# Patient Record
Sex: Female | Born: 1940 | Race: White | Hispanic: No | Smoking: Former smoker
Health system: Southern US, Community
[De-identification: ages and names within clinical notes are randomized; demographics above are authoritative.]

## PROBLEM LIST (undated history)

## (undated) DIAGNOSIS — I82409 Acute embolism and thrombosis of unspecified deep veins of unspecified lower extremity: Secondary | ICD-10-CM

## (undated) DIAGNOSIS — J852 Abscess of lung without pneumonia: Secondary | ICD-10-CM

## (undated) DIAGNOSIS — I10 Essential (primary) hypertension: Secondary | ICD-10-CM

## (undated) DIAGNOSIS — I639 Cerebral infarction, unspecified: Secondary | ICD-10-CM

## (undated) DIAGNOSIS — H9319 Tinnitus, unspecified ear: Secondary | ICD-10-CM

## (undated) DIAGNOSIS — I4891 Unspecified atrial fibrillation: Secondary | ICD-10-CM

## (undated) DIAGNOSIS — M1990 Unspecified osteoarthritis, unspecified site: Secondary | ICD-10-CM

## (undated) DIAGNOSIS — K449 Diaphragmatic hernia without obstruction or gangrene: Secondary | ICD-10-CM

## (undated) DIAGNOSIS — E785 Hyperlipidemia, unspecified: Secondary | ICD-10-CM

## (undated) DIAGNOSIS — I959 Hypotension, unspecified: Secondary | ICD-10-CM

## (undated) DIAGNOSIS — I350 Nonrheumatic aortic (valve) stenosis: Secondary | ICD-10-CM

## (undated) DIAGNOSIS — I2510 Atherosclerotic heart disease of native coronary artery without angina pectoris: Secondary | ICD-10-CM

## (undated) DIAGNOSIS — F419 Anxiety disorder, unspecified: Secondary | ICD-10-CM

## (undated) DIAGNOSIS — Z9981 Dependence on supplemental oxygen: Secondary | ICD-10-CM

## (undated) DIAGNOSIS — F32A Depression, unspecified: Secondary | ICD-10-CM

## (undated) DIAGNOSIS — D649 Anemia, unspecified: Secondary | ICD-10-CM

## (undated) DIAGNOSIS — I739 Peripheral vascular disease, unspecified: Secondary | ICD-10-CM

## (undated) DIAGNOSIS — I509 Heart failure, unspecified: Secondary | ICD-10-CM

## (undated) DIAGNOSIS — D126 Benign neoplasm of colon, unspecified: Secondary | ICD-10-CM

## (undated) DIAGNOSIS — M2550 Pain in unspecified joint: Secondary | ICD-10-CM

## (undated) DIAGNOSIS — Z9289 Personal history of other medical treatment: Secondary | ICD-10-CM

## (undated) DIAGNOSIS — K219 Gastro-esophageal reflux disease without esophagitis: Secondary | ICD-10-CM

## (undated) DIAGNOSIS — F329 Major depressive disorder, single episode, unspecified: Secondary | ICD-10-CM

## (undated) DIAGNOSIS — K5790 Diverticulosis of intestine, part unspecified, without perforation or abscess without bleeding: Secondary | ICD-10-CM

## (undated) DIAGNOSIS — E119 Type 2 diabetes mellitus without complications: Secondary | ICD-10-CM

## (undated) DIAGNOSIS — J449 Chronic obstructive pulmonary disease, unspecified: Secondary | ICD-10-CM

## (undated) HISTORY — DX: Pain in unspecified joint: M25.50

## (undated) HISTORY — DX: Gastro-esophageal reflux disease without esophagitis: K21.9

## (undated) HISTORY — DX: Anemia, unspecified: D64.9

## (undated) HISTORY — DX: Anxiety disorder, unspecified: F41.9

## (undated) HISTORY — DX: Benign neoplasm of colon, unspecified: D12.6

## (undated) HISTORY — DX: Hypotension, unspecified: I95.9

## (undated) HISTORY — PX: CATARACT EXTRACTION, BILATERAL: SHX1313

## (undated) HISTORY — PX: TUBAL LIGATION: SHX77

## (undated) HISTORY — DX: Abscess of lung without pneumonia: J85.2

## (undated) HISTORY — DX: Cerebral infarction, unspecified: I63.9

## (undated) HISTORY — DX: Diverticulosis of intestine, part unspecified, without perforation or abscess without bleeding: K57.90

## (undated) HISTORY — DX: Tinnitus, unspecified ear: H93.19

## (undated) HISTORY — DX: Diaphragmatic hernia without obstruction or gangrene: K44.9

## (undated) HISTORY — DX: Heart failure, unspecified: I50.9

---

## 1998-08-19 ENCOUNTER — Ambulatory Visit (HOSPITAL_COMMUNITY): Admission: RE | Admit: 1998-08-19 | Discharge: 1998-08-19 | Payer: Self-pay | Admitting: Internal Medicine

## 1998-11-29 DIAGNOSIS — D126 Benign neoplasm of colon, unspecified: Secondary | ICD-10-CM

## 1998-11-29 HISTORY — DX: Benign neoplasm of colon, unspecified: D12.6

## 1999-03-26 ENCOUNTER — Encounter: Payer: Self-pay | Admitting: *Deleted

## 1999-03-27 ENCOUNTER — Ambulatory Visit: Admission: RE | Admit: 1999-03-27 | Discharge: 1999-03-27 | Payer: Self-pay | Admitting: *Deleted

## 1999-04-30 HISTORY — PX: FEMORAL-POPLITEAL BYPASS GRAFT: SHX937

## 1999-05-18 ENCOUNTER — Inpatient Hospital Stay (HOSPITAL_COMMUNITY): Admission: RE | Admit: 1999-05-18 | Discharge: 1999-05-22 | Payer: Self-pay | Admitting: *Deleted

## 1999-10-06 ENCOUNTER — Other Ambulatory Visit: Admission: RE | Admit: 1999-10-06 | Discharge: 1999-10-06 | Payer: Self-pay | Admitting: Gastroenterology

## 1999-10-06 ENCOUNTER — Encounter (INDEPENDENT_AMBULATORY_CARE_PROVIDER_SITE_OTHER): Payer: Self-pay

## 2000-05-29 HISTORY — PX: CORONARY ARTERY BYPASS GRAFT: SHX141

## 2000-06-24 ENCOUNTER — Encounter: Payer: Self-pay | Admitting: Cardiology

## 2000-06-24 ENCOUNTER — Inpatient Hospital Stay (HOSPITAL_COMMUNITY): Admission: RE | Admit: 2000-06-24 | Discharge: 2000-07-02 | Payer: Self-pay | Admitting: Cardiology

## 2000-06-27 ENCOUNTER — Encounter: Payer: Self-pay | Admitting: Surgery

## 2000-06-28 ENCOUNTER — Encounter: Payer: Self-pay | Admitting: Surgery

## 2000-06-29 ENCOUNTER — Encounter: Payer: Self-pay | Admitting: Surgery

## 2000-07-30 HISTORY — PX: ANGIOPLASTY / STENTING ILIAC: SUR31

## 2000-08-02 ENCOUNTER — Encounter: Payer: Self-pay | Admitting: *Deleted

## 2000-08-02 ENCOUNTER — Ambulatory Visit (HOSPITAL_COMMUNITY): Admission: RE | Admit: 2000-08-02 | Discharge: 2000-08-02 | Payer: Self-pay | Admitting: *Deleted

## 2000-08-11 ENCOUNTER — Inpatient Hospital Stay (HOSPITAL_COMMUNITY): Admission: RE | Admit: 2000-08-11 | Discharge: 2000-08-12 | Payer: Self-pay | Admitting: *Deleted

## 2000-09-04 ENCOUNTER — Encounter: Payer: Self-pay | Admitting: Emergency Medicine

## 2000-09-04 ENCOUNTER — Inpatient Hospital Stay (HOSPITAL_COMMUNITY): Admission: EM | Admit: 2000-09-04 | Discharge: 2000-09-05 | Payer: Self-pay | Admitting: Emergency Medicine

## 2000-09-13 ENCOUNTER — Encounter (HOSPITAL_COMMUNITY): Admission: RE | Admit: 2000-09-13 | Discharge: 2000-12-12 | Payer: Self-pay | Admitting: Cardiology

## 2001-01-04 ENCOUNTER — Encounter: Payer: Self-pay | Admitting: *Deleted

## 2001-01-06 ENCOUNTER — Encounter: Payer: Self-pay | Admitting: *Deleted

## 2001-01-06 ENCOUNTER — Inpatient Hospital Stay (HOSPITAL_COMMUNITY): Admission: RE | Admit: 2001-01-06 | Discharge: 2001-01-09 | Payer: Self-pay | Admitting: *Deleted

## 2001-05-18 ENCOUNTER — Other Ambulatory Visit: Admission: RE | Admit: 2001-05-18 | Discharge: 2001-05-18 | Payer: Self-pay | Admitting: Internal Medicine

## 2001-09-27 ENCOUNTER — Encounter: Payer: Self-pay | Admitting: *Deleted

## 2001-09-29 ENCOUNTER — Ambulatory Visit (HOSPITAL_COMMUNITY): Admission: RE | Admit: 2001-09-29 | Discharge: 2001-09-29 | Payer: Self-pay | Admitting: *Deleted

## 2001-10-19 ENCOUNTER — Encounter (INDEPENDENT_AMBULATORY_CARE_PROVIDER_SITE_OTHER): Payer: Self-pay | Admitting: *Deleted

## 2001-10-19 ENCOUNTER — Inpatient Hospital Stay (HOSPITAL_COMMUNITY): Admission: RE | Admit: 2001-10-19 | Discharge: 2001-10-24 | Payer: Self-pay | Admitting: *Deleted

## 2001-10-20 ENCOUNTER — Encounter: Payer: Self-pay | Admitting: *Deleted

## 2001-11-29 HISTORY — PX: OTHER SURGICAL HISTORY: SHX169

## 2002-05-15 ENCOUNTER — Encounter: Payer: Self-pay | Admitting: *Deleted

## 2002-05-15 ENCOUNTER — Inpatient Hospital Stay (HOSPITAL_COMMUNITY): Admission: AD | Admit: 2002-05-15 | Discharge: 2002-05-21 | Payer: Self-pay | Admitting: *Deleted

## 2002-05-16 ENCOUNTER — Encounter: Payer: Self-pay | Admitting: *Deleted

## 2002-11-29 HISTORY — PX: AORTA - BILATERAL FEMORAL ARTERY BYPASS GRAFT: SHX1175

## 2003-09-23 ENCOUNTER — Encounter: Payer: Self-pay | Admitting: *Deleted

## 2003-09-25 ENCOUNTER — Ambulatory Visit (HOSPITAL_COMMUNITY): Admission: RE | Admit: 2003-09-25 | Discharge: 2003-09-25 | Payer: Self-pay | Admitting: *Deleted

## 2003-10-10 ENCOUNTER — Inpatient Hospital Stay (HOSPITAL_COMMUNITY): Admission: RE | Admit: 2003-10-10 | Discharge: 2003-10-16 | Payer: Self-pay | Admitting: *Deleted

## 2003-10-10 ENCOUNTER — Encounter (INDEPENDENT_AMBULATORY_CARE_PROVIDER_SITE_OTHER): Payer: Self-pay | Admitting: *Deleted

## 2003-11-30 HISTORY — PX: CAROTID ENDARTERECTOMY: SUR193

## 2004-05-13 ENCOUNTER — Encounter: Payer: Self-pay | Admitting: Internal Medicine

## 2004-07-14 ENCOUNTER — Ambulatory Visit (HOSPITAL_COMMUNITY): Admission: RE | Admit: 2004-07-14 | Discharge: 2004-07-14 | Payer: Self-pay | Admitting: *Deleted

## 2004-07-30 HISTORY — PX: SUBCLAVIAN BYPASS GRAFT: SHX1059

## 2004-08-14 ENCOUNTER — Ambulatory Visit (HOSPITAL_COMMUNITY): Admission: RE | Admit: 2004-08-14 | Discharge: 2004-08-14 | Payer: Self-pay | Admitting: *Deleted

## 2004-08-19 ENCOUNTER — Inpatient Hospital Stay (HOSPITAL_COMMUNITY): Admission: RE | Admit: 2004-08-19 | Discharge: 2004-08-21 | Payer: Self-pay | Admitting: *Deleted

## 2004-10-12 ENCOUNTER — Ambulatory Visit: Payer: Self-pay | Admitting: Internal Medicine

## 2005-01-01 ENCOUNTER — Ambulatory Visit: Payer: Self-pay | Admitting: Internal Medicine

## 2005-04-01 ENCOUNTER — Ambulatory Visit: Payer: Self-pay | Admitting: Internal Medicine

## 2005-07-27 ENCOUNTER — Ambulatory Visit: Payer: Self-pay | Admitting: Internal Medicine

## 2005-08-03 ENCOUNTER — Ambulatory Visit: Payer: Self-pay | Admitting: Internal Medicine

## 2005-08-03 ENCOUNTER — Other Ambulatory Visit: Admission: RE | Admit: 2005-08-03 | Discharge: 2005-08-03 | Payer: Self-pay | Admitting: Internal Medicine

## 2005-08-31 ENCOUNTER — Ambulatory Visit: Payer: Self-pay | Admitting: Internal Medicine

## 2005-09-13 ENCOUNTER — Ambulatory Visit: Payer: Self-pay | Admitting: Gastroenterology

## 2005-09-27 ENCOUNTER — Encounter (INDEPENDENT_AMBULATORY_CARE_PROVIDER_SITE_OTHER): Payer: Self-pay | Admitting: *Deleted

## 2005-09-27 ENCOUNTER — Ambulatory Visit: Payer: Self-pay | Admitting: Gastroenterology

## 2005-12-28 ENCOUNTER — Ambulatory Visit: Payer: Self-pay | Admitting: Internal Medicine

## 2006-03-28 ENCOUNTER — Ambulatory Visit: Payer: Self-pay | Admitting: Internal Medicine

## 2006-06-27 ENCOUNTER — Ambulatory Visit: Payer: Self-pay | Admitting: Internal Medicine

## 2006-10-17 ENCOUNTER — Ambulatory Visit: Payer: Self-pay | Admitting: Family Medicine

## 2006-10-18 ENCOUNTER — Ambulatory Visit: Payer: Self-pay | Admitting: Internal Medicine

## 2006-10-31 ENCOUNTER — Ambulatory Visit: Payer: Self-pay | Admitting: Internal Medicine

## 2006-10-31 LAB — CONVERTED CEMR LAB
AST: 24 units/L (ref 0–37)
Albumin: 3.3 g/dL — ABNORMAL LOW (ref 3.5–5.2)
BUN: 23 mg/dL (ref 6–23)
Basophils Absolute: 0.1 10*3/uL (ref 0.0–0.1)
CO2: 29 meq/L (ref 19–32)
Chloride: 107 meq/L (ref 96–112)
Cholesterol: 124 mg/dL (ref 0–200)
Creatinine, Ser: 1.1 mg/dL (ref 0.4–1.2)
Eosinophil percent: 3.6 % (ref 0.0–5.0)
HCT: 35.3 % — ABNORMAL LOW (ref 36.0–46.0)
HDL: 45.5 mg/dL (ref >39.0)
Hemoglobin: 11.9 g/dL — ABNORMAL LOW (ref 12.0–15.0)
MCHC: 33.8 g/dL (ref 30.0–36.0)
MCV: 87.8 fL (ref 78.0–100.0)
Monocytes Absolute: 0.7 10*3/uL (ref 0.2–0.7)
Monocytes Relative: 7.9 % (ref 3.0–11.0)
Neutro Abs: 5.2 10*3/uL (ref 1.4–7.7)
Neutrophils Relative %: 61.3 % (ref 43.0–77.0)
TSH: 3.1 microintl units/mL (ref 0.35–5.50)
Total Bilirubin: 0.6 mg/dL (ref 0.3–1.2)
Triglyceride fasting, serum: 224 mg/dL (ref 0–149)
VLDL: 45 mg/dL — ABNORMAL HIGH (ref 0–40)

## 2006-11-07 ENCOUNTER — Ambulatory Visit: Payer: Self-pay | Admitting: Internal Medicine

## 2007-01-24 ENCOUNTER — Ambulatory Visit: Payer: Self-pay | Admitting: Internal Medicine

## 2007-03-06 ENCOUNTER — Ambulatory Visit: Payer: Self-pay | Admitting: Internal Medicine

## 2007-05-01 ENCOUNTER — Ambulatory Visit: Payer: Self-pay | Admitting: *Deleted

## 2007-06-12 ENCOUNTER — Encounter: Payer: Self-pay | Admitting: Internal Medicine

## 2007-06-12 ENCOUNTER — Ambulatory Visit: Payer: Self-pay | Admitting: Internal Medicine

## 2007-06-15 ENCOUNTER — Encounter: Payer: Self-pay | Admitting: Internal Medicine

## 2007-06-15 DIAGNOSIS — I10 Essential (primary) hypertension: Secondary | ICD-10-CM | POA: Insufficient documentation

## 2007-06-15 DIAGNOSIS — Z860100 Personal history of colon polyps, unspecified: Secondary | ICD-10-CM | POA: Insufficient documentation

## 2007-06-15 DIAGNOSIS — E785 Hyperlipidemia, unspecified: Secondary | ICD-10-CM | POA: Insufficient documentation

## 2007-06-15 DIAGNOSIS — M1990 Unspecified osteoarthritis, unspecified site: Secondary | ICD-10-CM

## 2007-06-15 DIAGNOSIS — I739 Peripheral vascular disease, unspecified: Secondary | ICD-10-CM

## 2007-09-05 ENCOUNTER — Telehealth: Payer: Self-pay | Admitting: Internal Medicine

## 2007-09-29 ENCOUNTER — Telehealth: Payer: Self-pay | Admitting: Internal Medicine

## 2007-10-16 ENCOUNTER — Ambulatory Visit: Payer: Self-pay | Admitting: Internal Medicine

## 2008-02-10 ENCOUNTER — Encounter: Payer: Self-pay | Admitting: Internal Medicine

## 2008-02-12 ENCOUNTER — Ambulatory Visit: Payer: Self-pay | Admitting: Internal Medicine

## 2008-02-12 LAB — CONVERTED CEMR LAB
AST: 28 units/L (ref 0–37)
Albumin: 4.1 g/dL (ref 3.5–5.2)
Alkaline Phosphatase: 64 units/L (ref 39–117)
Basophils Absolute: 0 10*3/uL (ref 0.0–0.1)
Basophils Relative: 0.5 % (ref 0.0–1.0)
CO2: 28 meq/L (ref 19–32)
Chloride: 109 meq/L (ref 96–112)
Creatinine, Ser: 1.1 mg/dL (ref 0.4–1.2)
Direct LDL: 89 mg/dL
HCT: 40.3 % (ref 36.0–46.0)
Hemoglobin: 13.2 g/dL (ref 12.0–15.0)
MCHC: 32.7 g/dL (ref 30.0–36.0)
Monocytes Absolute: 0.4 10*3/uL (ref 0.2–0.7)
Neutrophils Relative %: 74.8 % (ref 43.0–77.0)
RBC: 4.47 M/uL (ref 3.87–5.11)
RDW: 13.5 % (ref 11.5–14.6)
Sodium: 145 meq/L (ref 135–145)
Total Bilirubin: 0.6 mg/dL (ref 0.3–1.2)
Total CHOL/HDL Ratio: 3.6
Total Protein: 6.9 g/dL (ref 6.0–8.3)
Triglycerides: 243 mg/dL (ref 0–149)
VLDL: 49 mg/dL — ABNORMAL HIGH (ref 0–40)
WBC: 9 10*3/uL (ref 4.5–10.5)

## 2008-02-26 ENCOUNTER — Ambulatory Visit: Payer: Self-pay | Admitting: Internal Medicine

## 2008-03-05 ENCOUNTER — Ambulatory Visit: Payer: Self-pay | Admitting: Cardiology

## 2008-03-13 ENCOUNTER — Encounter: Payer: Self-pay | Admitting: Internal Medicine

## 2008-03-13 ENCOUNTER — Ambulatory Visit: Payer: Self-pay

## 2008-04-30 ENCOUNTER — Telehealth: Payer: Self-pay | Admitting: Internal Medicine

## 2008-05-23 ENCOUNTER — Ambulatory Visit: Payer: Self-pay | Admitting: *Deleted

## 2008-06-06 ENCOUNTER — Ambulatory Visit: Payer: Self-pay | Admitting: Internal Medicine

## 2008-06-06 DIAGNOSIS — F329 Major depressive disorder, single episode, unspecified: Secondary | ICD-10-CM

## 2008-07-24 ENCOUNTER — Ambulatory Visit: Payer: Self-pay | Admitting: Internal Medicine

## 2008-10-08 ENCOUNTER — Ambulatory Visit: Payer: Self-pay | Admitting: Internal Medicine

## 2008-11-06 ENCOUNTER — Ambulatory Visit: Payer: Self-pay | Admitting: *Deleted

## 2008-11-08 ENCOUNTER — Ambulatory Visit: Payer: Self-pay | Admitting: Internal Medicine

## 2008-11-08 ENCOUNTER — Encounter: Payer: Self-pay | Admitting: Internal Medicine

## 2008-12-10 ENCOUNTER — Telehealth: Payer: Self-pay | Admitting: Internal Medicine

## 2009-02-03 ENCOUNTER — Ambulatory Visit: Payer: Self-pay | Admitting: Internal Medicine

## 2009-02-22 ENCOUNTER — Encounter: Payer: Self-pay | Admitting: Internal Medicine

## 2009-03-05 ENCOUNTER — Encounter: Payer: Self-pay | Admitting: Internal Medicine

## 2009-03-10 ENCOUNTER — Encounter: Payer: Self-pay | Admitting: Internal Medicine

## 2009-03-13 ENCOUNTER — Ambulatory Visit: Payer: Self-pay | Admitting: Internal Medicine

## 2009-03-20 ENCOUNTER — Ambulatory Visit: Payer: Self-pay

## 2009-03-20 ENCOUNTER — Encounter: Payer: Self-pay | Admitting: Internal Medicine

## 2009-03-26 ENCOUNTER — Telehealth: Payer: Self-pay | Admitting: Internal Medicine

## 2009-04-01 ENCOUNTER — Ambulatory Visit: Payer: Self-pay | Admitting: Cardiology

## 2009-04-08 ENCOUNTER — Encounter: Payer: Self-pay | Admitting: Internal Medicine

## 2009-04-15 ENCOUNTER — Encounter: Payer: Self-pay | Admitting: Internal Medicine

## 2009-04-15 ENCOUNTER — Encounter: Payer: Self-pay | Admitting: Cardiology

## 2009-04-17 ENCOUNTER — Telehealth: Payer: Self-pay | Admitting: Cardiology

## 2009-04-21 ENCOUNTER — Encounter: Payer: Self-pay | Admitting: Cardiology

## 2009-05-01 ENCOUNTER — Ambulatory Visit: Payer: Self-pay | Admitting: *Deleted

## 2009-05-08 ENCOUNTER — Encounter: Admission: RE | Admit: 2009-05-08 | Discharge: 2009-05-08 | Payer: Self-pay | Admitting: General Surgery

## 2009-05-12 ENCOUNTER — Encounter (INDEPENDENT_AMBULATORY_CARE_PROVIDER_SITE_OTHER): Payer: Self-pay | Admitting: General Surgery

## 2009-05-12 ENCOUNTER — Encounter: Payer: Self-pay | Admitting: Internal Medicine

## 2009-05-12 ENCOUNTER — Ambulatory Visit (HOSPITAL_BASED_OUTPATIENT_CLINIC_OR_DEPARTMENT_OTHER): Admission: RE | Admit: 2009-05-12 | Discharge: 2009-05-12 | Payer: Self-pay | Admitting: General Surgery

## 2009-05-29 ENCOUNTER — Encounter: Payer: Self-pay | Admitting: Internal Medicine

## 2009-06-06 ENCOUNTER — Ambulatory Visit: Payer: Self-pay | Admitting: Internal Medicine

## 2009-06-06 DIAGNOSIS — R609 Edema, unspecified: Secondary | ICD-10-CM | POA: Insufficient documentation

## 2009-06-17 ENCOUNTER — Encounter: Payer: Self-pay | Admitting: Internal Medicine

## 2009-07-12 ENCOUNTER — Encounter: Admission: RE | Admit: 2009-07-12 | Discharge: 2009-07-12 | Payer: Self-pay | Admitting: Orthopedic Surgery

## 2009-07-18 ENCOUNTER — Encounter: Payer: Self-pay | Admitting: Internal Medicine

## 2009-07-22 ENCOUNTER — Encounter: Payer: Self-pay | Admitting: Internal Medicine

## 2009-09-01 ENCOUNTER — Ambulatory Visit: Payer: Self-pay | Admitting: Internal Medicine

## 2009-09-01 LAB — CONVERTED CEMR LAB
Alkaline Phosphatase: 56 units/L (ref 39–117)
Basophils Relative: 1 % (ref 0.0–3.0)
Bilirubin, Direct: 0 mg/dL (ref 0.0–0.3)
Blood in Urine, dipstick: NEGATIVE
Calcium: 9.9 mg/dL (ref 8.4–10.5)
Creatinine, Ser: 1.2 mg/dL (ref 0.4–1.2)
Eosinophils Absolute: 0.2 10*3/uL (ref 0.0–0.7)
Eosinophils Relative: 2.8 % (ref 0.0–5.0)
GFR calc non Af Amer: 47.5 mL/min (ref >60)
HDL: 49.2 mg/dL (ref >39.00)
Ketones, urine, test strip: NEGATIVE
LDL Cholesterol: 70 mg/dL (ref 0–99)
Lymphocytes Relative: 32.9 % (ref 12.0–46.0)
Monocytes Relative: 8.3 % (ref 3.0–12.0)
Neutrophils Relative %: 55 % (ref 43.0–77.0)
Nitrite: NEGATIVE
RBC: 3.84 M/uL — ABNORMAL LOW (ref 3.87–5.11)
Total CHOL/HDL Ratio: 3
Total Protein: 7 g/dL (ref 6.0–8.3)
Triglycerides: 127 mg/dL (ref 0.0–149.0)
Urobilinogen, UA: 0.2
WBC Urine, dipstick: NEGATIVE
WBC: 6.5 10*3/uL (ref 4.5–10.5)

## 2009-09-04 ENCOUNTER — Encounter: Payer: Self-pay | Admitting: Internal Medicine

## 2009-09-08 ENCOUNTER — Ambulatory Visit: Payer: Self-pay | Admitting: Internal Medicine

## 2009-09-08 DIAGNOSIS — Z87891 Personal history of nicotine dependence: Secondary | ICD-10-CM

## 2009-10-28 ENCOUNTER — Encounter: Payer: Self-pay | Admitting: Internal Medicine

## 2009-10-29 ENCOUNTER — Telehealth: Payer: Self-pay | Admitting: Internal Medicine

## 2009-11-25 ENCOUNTER — Ambulatory Visit: Payer: Self-pay | Admitting: Vascular Surgery

## 2009-11-29 DIAGNOSIS — J852 Abscess of lung without pneumonia: Secondary | ICD-10-CM

## 2009-11-29 HISTORY — DX: Abscess of lung without pneumonia: J85.2

## 2009-11-29 HISTORY — PX: LUNG BIOPSY: SHX232

## 2009-12-19 ENCOUNTER — Ambulatory Visit: Payer: Self-pay | Admitting: Internal Medicine

## 2009-12-19 DIAGNOSIS — K219 Gastro-esophageal reflux disease without esophagitis: Secondary | ICD-10-CM

## 2009-12-19 HISTORY — DX: Gastro-esophageal reflux disease without esophagitis: K21.9

## 2010-02-10 ENCOUNTER — Encounter: Payer: Self-pay | Admitting: Internal Medicine

## 2010-03-06 ENCOUNTER — Encounter: Payer: Self-pay | Admitting: Internal Medicine

## 2010-03-12 ENCOUNTER — Ambulatory Visit: Payer: Self-pay | Admitting: Internal Medicine

## 2010-03-12 DIAGNOSIS — R059 Cough, unspecified: Secondary | ICD-10-CM | POA: Insufficient documentation

## 2010-03-12 DIAGNOSIS — R05 Cough: Secondary | ICD-10-CM

## 2010-03-17 ENCOUNTER — Ambulatory Visit: Payer: Self-pay | Admitting: Internal Medicine

## 2010-04-01 ENCOUNTER — Ambulatory Visit: Payer: Self-pay | Admitting: Cardiology

## 2010-04-01 DIAGNOSIS — I25810 Atherosclerosis of coronary artery bypass graft(s) without angina pectoris: Secondary | ICD-10-CM | POA: Insufficient documentation

## 2010-04-07 ENCOUNTER — Telehealth (INDEPENDENT_AMBULATORY_CARE_PROVIDER_SITE_OTHER): Payer: Self-pay | Admitting: *Deleted

## 2010-04-08 ENCOUNTER — Encounter (HOSPITAL_COMMUNITY): Admission: RE | Admit: 2010-04-08 | Discharge: 2010-06-02 | Payer: Self-pay | Admitting: Cardiology

## 2010-04-08 ENCOUNTER — Ambulatory Visit: Payer: Self-pay

## 2010-04-08 ENCOUNTER — Ambulatory Visit: Payer: Self-pay | Admitting: Cardiology

## 2010-05-11 ENCOUNTER — Telehealth: Payer: Self-pay | Admitting: Internal Medicine

## 2010-05-26 ENCOUNTER — Ambulatory Visit: Payer: Self-pay | Admitting: Vascular Surgery

## 2010-06-10 ENCOUNTER — Telehealth: Payer: Self-pay | Admitting: Internal Medicine

## 2010-06-11 ENCOUNTER — Ambulatory Visit: Payer: Self-pay | Admitting: Internal Medicine

## 2010-06-11 ENCOUNTER — Telehealth: Payer: Self-pay | Admitting: Internal Medicine

## 2010-06-18 ENCOUNTER — Telehealth: Payer: Self-pay | Admitting: Internal Medicine

## 2010-06-25 ENCOUNTER — Ambulatory Visit: Payer: Self-pay | Admitting: Internal Medicine

## 2010-07-07 ENCOUNTER — Ambulatory Visit: Payer: Self-pay | Admitting: Family Medicine

## 2010-07-07 DIAGNOSIS — J209 Acute bronchitis, unspecified: Secondary | ICD-10-CM | POA: Insufficient documentation

## 2010-07-29 ENCOUNTER — Encounter (INDEPENDENT_AMBULATORY_CARE_PROVIDER_SITE_OTHER): Payer: Self-pay | Admitting: *Deleted

## 2010-07-30 ENCOUNTER — Encounter: Admission: RE | Admit: 2010-07-30 | Discharge: 2010-07-30 | Payer: Self-pay | Admitting: Internal Medicine

## 2010-07-30 ENCOUNTER — Telehealth: Payer: Self-pay | Admitting: Internal Medicine

## 2010-07-30 ENCOUNTER — Telehealth: Payer: Self-pay | Admitting: Family Medicine

## 2010-07-30 ENCOUNTER — Ambulatory Visit: Payer: Self-pay | Admitting: Internal Medicine

## 2010-07-30 LAB — CONVERTED CEMR LAB
BUN: 21 mg/dL (ref 6–23)
Basophils Relative: 0 % (ref 0.0–3.0)
Calcium: 9.3 mg/dL (ref 8.4–10.5)
Creatinine, Ser: 1.1 mg/dL (ref 0.4–1.2)
Eosinophils Absolute: 0 10*3/uL (ref 0.0–0.7)
GFR calc non Af Amer: 53.5 mL/min (ref >60)
MCHC: 32.4 g/dL (ref 30.0–36.0)
MCV: 85.3 fL (ref 78.0–100.0)
Monocytes Absolute: 0.9 10*3/uL (ref 0.1–1.0)
Neutro Abs: 13.2 10*3/uL — ABNORMAL HIGH (ref 1.4–7.7)
Neutrophils Relative %: 88.2 % — ABNORMAL HIGH (ref 43.0–77.0)
Potassium: 4.1 meq/L (ref 3.5–5.1)
RBC: 3.38 M/uL — ABNORMAL LOW (ref 3.87–5.11)

## 2010-07-31 ENCOUNTER — Telehealth: Payer: Self-pay | Admitting: Internal Medicine

## 2010-08-04 ENCOUNTER — Telehealth: Payer: Self-pay | Admitting: Internal Medicine

## 2010-08-11 ENCOUNTER — Ambulatory Visit: Payer: Self-pay | Admitting: Internal Medicine

## 2010-08-11 DIAGNOSIS — I959 Hypotension, unspecified: Secondary | ICD-10-CM

## 2010-08-11 HISTORY — DX: Hypotension, unspecified: I95.9

## 2010-08-13 ENCOUNTER — Ambulatory Visit: Payer: Self-pay | Admitting: Internal Medicine

## 2010-08-13 DIAGNOSIS — J852 Abscess of lung without pneumonia: Secondary | ICD-10-CM | POA: Insufficient documentation

## 2010-08-18 ENCOUNTER — Ambulatory Visit: Payer: Self-pay | Admitting: Critical Care Medicine

## 2010-08-21 ENCOUNTER — Ambulatory Visit: Payer: Self-pay | Admitting: Gastroenterology

## 2010-08-21 ENCOUNTER — Encounter: Payer: Self-pay | Admitting: Critical Care Medicine

## 2010-08-21 ENCOUNTER — Inpatient Hospital Stay (HOSPITAL_COMMUNITY): Admission: RE | Admit: 2010-08-21 | Discharge: 2010-08-24 | Payer: Self-pay | Admitting: Critical Care Medicine

## 2010-08-21 ENCOUNTER — Ambulatory Visit: Payer: Self-pay | Admitting: Critical Care Medicine

## 2010-08-21 ENCOUNTER — Ambulatory Visit: Payer: Self-pay | Admitting: Internal Medicine

## 2010-08-24 ENCOUNTER — Encounter: Payer: Self-pay | Admitting: Internal Medicine

## 2010-08-28 ENCOUNTER — Ambulatory Visit: Payer: Self-pay | Admitting: Critical Care Medicine

## 2010-08-28 DIAGNOSIS — D649 Anemia, unspecified: Secondary | ICD-10-CM

## 2010-08-28 DIAGNOSIS — K921 Melena: Secondary | ICD-10-CM

## 2010-08-28 HISTORY — DX: Anemia, unspecified: D64.9

## 2010-08-28 LAB — CONVERTED CEMR LAB
Eosinophils Relative: 1.1 % (ref 0.0–5.0)
Lymphocytes Relative: 20.7 % (ref 12.0–46.0)
Monocytes Relative: 7.7 % (ref 3.0–12.0)
Neutrophils Relative %: 70.4 % (ref 43.0–77.0)
Platelets: 160 10*3/uL (ref 150.0–400.0)
WBC: 9.5 10*3/uL (ref 4.5–10.5)

## 2010-09-01 ENCOUNTER — Encounter (INDEPENDENT_AMBULATORY_CARE_PROVIDER_SITE_OTHER): Payer: Self-pay | Admitting: *Deleted

## 2010-09-01 ENCOUNTER — Ambulatory Visit: Payer: Self-pay | Admitting: Critical Care Medicine

## 2010-09-09 ENCOUNTER — Ambulatory Visit: Payer: Self-pay | Admitting: Internal Medicine

## 2010-09-09 ENCOUNTER — Encounter: Payer: Self-pay | Admitting: Gastroenterology

## 2010-09-11 ENCOUNTER — Encounter: Payer: Self-pay | Admitting: Internal Medicine

## 2010-09-14 ENCOUNTER — Ambulatory Visit: Payer: Self-pay | Admitting: Internal Medicine

## 2010-09-15 ENCOUNTER — Telehealth: Payer: Self-pay | Admitting: Physician Assistant

## 2010-09-25 ENCOUNTER — Ambulatory Visit: Payer: Self-pay | Admitting: Critical Care Medicine

## 2010-09-25 ENCOUNTER — Encounter: Payer: Self-pay | Admitting: Critical Care Medicine

## 2010-09-25 DIAGNOSIS — J449 Chronic obstructive pulmonary disease, unspecified: Secondary | ICD-10-CM

## 2010-10-19 ENCOUNTER — Ambulatory Visit: Payer: Self-pay | Admitting: Gastroenterology

## 2010-10-26 ENCOUNTER — Encounter: Payer: Self-pay | Admitting: Gastroenterology

## 2010-11-25 ENCOUNTER — Ambulatory Visit: Payer: Self-pay | Admitting: Critical Care Medicine

## 2010-12-03 ENCOUNTER — Telehealth: Payer: Self-pay | Admitting: Internal Medicine

## 2010-12-10 ENCOUNTER — Telehealth: Payer: Self-pay | Admitting: Internal Medicine

## 2010-12-20 ENCOUNTER — Encounter: Payer: Self-pay | Admitting: Internal Medicine

## 2010-12-31 NOTE — Op Note (Signed)
Summary: Bronchoscopy / [facility]  Bronchoscopy / [facility]   Imported By: Lennie Odor 08/25/2010 16:28:16  _____________________________________________________________________  External Attachment:    Type:   Image     Comment:   External Document

## 2010-12-31 NOTE — Assessment & Plan Note (Signed)
Summary: Cardiology Nuclear Study  Nuclear Med Background Indications for Stress Test: Evaluation for Ischemia, Graft Patency   History: CABG, COPD, Heart Catheterization, Myocardial Perfusion Study  History Comments: '01CABG x 4, EF=40%; Multiple vascular procedures; '04 aorto-bifem graft; '05 (L) subclavian bypass;'09 DVV:OHYWVP, EF=69%  Symptoms: Dizziness, DOE, Fatigue, Near Syncope, Rapid HR    Nuclear Pre-Procedure Cardiac Risk Factors: Carotid Disease, Family History - CAD, History of Smoking, Hypertension, Lipids, PVD Caffeine/Decaff Intake: None NPO After: 7:30 PM Lungs: Clear.  O2 Sat 95% on RA. IV 0.9% NS with Angio Cath: 22g     IV Site: (R) AC IV Started by: Irean Hong RN Chest Size (in) 40     Cup Size D     Height (in): 63 Weight (lb): 150 BMI: 26.67  Nuclear Med Study 1 or 2 day study:  1 day     Stress Test Type:  Eugenie Birks Reading MD:  Willa Rough, MD     Referring MD:  Valera Castle, MD Resting Radionuclide:  Technetium 11m Tetrofosmin     Resting Radionuclide Dose:  11 mCi  Stress Radionuclide:  Technetium 54m Tetrofosmin     Stress Radionuclide Dose:  33 mCi   Stress Protocol   Lexiscan: 0.4 mg   Stress Test Technologist:  Rea College CMA-N     Nuclear Technologist:  Domenic Polite CNMT  Rest Procedure  Myocardial perfusion imaging was performed at rest 45 minutes following the intravenous administration of Myoview Technetium 37m Tetrofosmin.  Stress Procedure  The patient received IV Lexiscan 0.4 mg over 15-seconds.  Myoview injected at 30-seconds.  There were no significant changes with lexiscan; only occasional PVC's.  She did c/o her chest burning.  Quantitative spect images were obtained after a 45 minute delay.  QPS Raw Data Images:  Normal; no motion artifact; normal heart/lung ratio. Stress Images:  There is normal uptake in all areas. Rest Images:  Normal homogeneous uptake in all areas of the myocardium. Subtraction (SDS):  No evidence  of ischemia. Transient Ischemic Dilatation:  .99  (Normal <1.22)  Lung/Heart Ratio:  .27  (Normal <0.45)  Quantitative Gated Spect Images QGS EDV:  54 ml QGS ESV:  14 ml QGS EF:  74 % QGS cine images:  Normal motion  Findings Normal nuclear study      Overall Impression  Exercise Capacity: Lexiscan BP Response: Normal blood pressure response. Clinical Symptoms: Chest burning ECG Impression: No significant ST segment change suggestive of ischemia. Overall Impression: Normal stress nuclear study.  Appended Document: Cardiology Nuclear Study reassurance...no change in meds.  Appended Document: Cardiology Nuclear Study LMTCB./CY  Appended Document: Cardiology Nuclear Study PT AWARE./CY

## 2010-12-31 NOTE — Progress Notes (Signed)
Summary: refill hydrocodone acetamin.  Phone Note Refill Request Message from:  Fax from Pharmacy  Refills Requested: Medication #1:  HYDROCODONE-ACETAMINOPHEN 5-500 MG  TABS 1/2 tab six times daily   Last Refilled: 11/13/2010 [facility]   Method Requested: Fax to Local Pharmacy Initial call taken by: Duard Brady LPN,  December 10, 2010 8:11 AM  Follow-up for Phone Call        #90  RF 2 Follow-up by: Gordy Savers  MD,  December 10, 2010 12:44 PM    Prescriptions: HYDROCODONE-ACETAMINOPHEN 5-500 MG  TABS (HYDROCODONE-ACETAMINOPHEN) 1/2 tab six times daily  #90 x 2   Entered by:   Duard Brady LPN   Authorized by:   Gordy Savers  MD   Signed by:   Duard Brady LPN on 54/07/8118   Method used:   Historical   RxID:   [id number]  faxed back to pharmacy. kik

## 2010-12-31 NOTE — Assessment & Plan Note (Signed)
Summary: Pulmonary OV   Copy to:  [name] MD Primary Provider/Referring Provider:  Gordy Savers  MD  CC:  1 wk follow up.  Pt states breathing has improved.  Denies wheezing, chest tightness, and cough.  .  History of Present Illness: 70 yo WF with RML lung abscess no other risk factors and neg FOB for endobronchial lesion 9/11.  August 28, 2010 5:20 PM This pt was admitted to hosp after FOB on 9/23.  Pt had hemoptysis after FOB.  All c/s from FOB neg. Pt also with GIB and heme pos stools.  Pt had decompensated CHF as well.  Echo stable.  Hgb fell to 6.7.  Hgb 9.7 on d/c.  Pt received two units PRBC.   Pt d/c on levaquin. Pt had trop leak with low hgb but no true AMI.  Echo was stable  Since d/c there has been no dyspnea.  There is  no chest pain,  no f/c/s.  There is no hemoptysis.  There is  no mucus.   Pt is improved. Pt denies any significant sore throat, nasal congestion or excess secretions, fever, chills, sweats, unintended weight loss, pleurtic or exertional chest pain, orthopnea PND, or leg swelling Pt denies any increase in rescue therapy over baseline, denies waking up needing it or having any early am or nocturnal exacerbations of coughing/wheezing/or dyspnea.     Preventive Screening-Counseling & Management  Alcohol-Tobacco     Smoking Status: quit > 6 months     Smoke Cessation Stage: quit     Packs/Day: 1.5     Year Started: 1960     Year Quit: 06/2000     Pack years: 44  Current Medications (verified): 1)  Advair Diskus 250-50 Mcg/dose Misc (Fluticasone-Salmeterol) .... Inhale 1 Puff As Directed Twice A Day 2)  Altace 5 Mg Caps (Ramipril) .... Take 1 Capsule By Mouth Once A Day ** Hold 3)  Felodipine 5 Mg Tb24 (Felodipine) .Marland Kitchen.. 1 Once Daily  ** Hold 4)  Gabapentin 300 Mg Caps (Gabapentin) .Marland Kitchen.. 1 Tab Qam, 1 Tab @ Lunch, 2 Tabs At Bedtime 5)  Imdur 30 Mg Tb24 (Isosorbide Mononitrate) .Marland Kitchen.. 1 Once Daily 6)  Lorazepam 0.5 Mg Tabs (Lorazepam) ....  Take 1 Tablet Two Times A Day 7)  Simvastatin 80 Mg  Tabs (Simvastatin) .Marland Kitchen.. 1 Once Daily 8)  Hydrocodone-Acetaminophen 5-500 Mg  Tabs (Hydrocodone-Acetaminophen) .... 1/2 Tab Six Times Daily 9)  Chlorthalidone 25 Mg  Tabs (Chlorthalidone) .... One Daily ** Hold 10)  Sertraline Hcl 50 Mg  Tabs (Sertraline Hcl) .... 1/2 Tab At Bedtime 11)  Voltaren 1 % Gel (Diclofenac Sodium) .... Use Once Daily 12)  Fexofenadine Hcl 180 Mg Tabs (Fexofenadine Hcl) .Marland Kitchen.. 1 Once Daily As Needed 13)  Cilostazol 100 Mg Tabs (Cilostazol) .Marland Kitchen.. 1 Tab At Bedtime 14)  Fish Oil 1000 Mg Caps (Omega-3 Fatty Acids) .Marland Kitchen.. 1 Cap Once Daily 15)  Aspirin 81 Mg Tbec (Aspirin) .... Take One Tablet By Mouth Daily 16)  Multivitamins   Tabs (Multiple Vitamin) .Marland Kitchen.. 1 Tab Once Daily 17)  Furosemide 40 Mg Tabs (Furosemide) .... One Daily, As Needed To Control Peripheral Edema **hold** 18)  Omeprazole 20 Mg Tbec (Omeprazole) .Marland Kitchen.. 1 Tab Once Daily 19)  Promethazine Hcl 25 Mg Tabs (Promethazine Hcl) .... One Every 6 Hours For Nausea As Needed 20)  First-Dukes Mouthwash  Susp (Diphenhyd-Hydrocort-Nystatin) .Marland Kitchen.. 1 Teaspoon Swish and Swallow 4 Times Daily As Needed 21)  Levaquin 750 Mg  Tabs (Levofloxacin) .... One Tablet  By Mouth Daily  Allergies (verified): 1)  Codeine Phosphate (Codeine Phosphate)  Past History:  Past medical, surgical, family and social histories (including risk factors) reviewed, and no changes noted (except as noted below).  Past Medical History: CORONARY ARTERY DISEASE (ICD-414.00) PERIPHERAL VASCULAR DISEASE (ICD-443.9) HYPERTENSION (ICD-401.9) HYPERLIPIDEMIA (ICD-272.4) DEPRESSIVE DISORDER (ICD-311) OSTEOARTHRITIS (ICD-715.90) COLONIC POLYPS, HX OF (ICD-V12.72) lumbar spinal stenosis  right middle lobe abscess September 2011    -resolved on CXR 09/01/10    -neg FOB for endobronchial lesion RML GERD  Past Surgical History: Reviewed history from 08/18/2010 and no changes required. L subclavian bypass   September 2005 Tubal ligation Coronary artery bypass graft July 2001 Femoral popliteal bypass R.-June 2000  status post right common iliac PTA with stent placement, September 2001 status post redo right femoral l bypass February 2002; complicated by early thrombosis, requiring thrombectomy  aorto bifemoral bypass November 2004  colonoscopy October 2006 breast  bx (2) 2010 benign Dexa 12/09 carotid duplex- 6-09 Lumbar MRI 8-10 ABI 4-10 nuclear stress testing 5- 2011  negative for ischemia  Family History: Reviewed history from 09/08/2009 and no changes required. father died age 39.  History diabetes, cerebrovascular disease mother died at  age 19, status post hip surgery of an MI 5 brothers, one sister- one brother deceased -DM, CAD, SDAT  Positive diabetes, and coronary artery disease  Social History: Reviewed history from 08/18/2010 and no changes required. Former Smoker.  1 1/2 x 40 yrs.  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  The patient denies shortness of breath at rest, productive cough, non-productive cough, coughing up blood, chest pain, irregular heartbeats, acid heartburn, indigestion, loss of appetite, weight change, abdominal pain, difficulty swallowing, sore throat, tooth/dental problems, headaches, nasal congestion/difficulty breathing through nose, sneezing, itching, ear ache, anxiety, depression, hand/feet swelling, joint stiffness or pain, rash, change in color of mucus, and fever.    Vital Signs:  Patient profile:   70 year old female Height:      63.5 inches Weight:      131.25 pounds BMI:     22.97 O2 Sat:      95 % Temp:     97.9 degrees F oral Pulse rate:   90 / minute BP sitting:   120 / 56  (left arm) Cuff size:   regular  Vitals Entered By: Gweneth Dimitri RN (August 28, 2010 5:06 PM) CC: 1 wk follow up.  Pt states breathing has  improved.  Denies wheezing, chest tightness, cough.   Comments Medications reviewed with patient Daytime contact number verified with patient. Gweneth Dimitri RN  August 28, 2010 5:07 PM    Physical Exam  Additional Exam:  Gen: Pleasant, well-nourished, in no distress,  normal affect ENT: No lesions,  mouth clear,  oropharynx clear, no postnasal drip Neck: No JVD, no TMG, no carotid bruits Lungs: No use of accessory muscles, no dullness to percussion, clear, no rhonchi Cardiovascular: RRR, heart sounds normal, no murmur or gallops, no peripheral edema Abdomen: soft and NT, no HSM,  BS normal Musculoskeletal: No deformities, no cyanosis or clubbing Neuro: alert, non focal Skin: Warm, no lesions or rashes   White Cell Count          9.5 K/uL                    4.5-10.5  Red Cell Count       [L]  3.67 Mil/uL                 3.87-5.11   Hemoglobin           [L]  10.4 g/dL                   16.1-09.6   Hematocrit           [L]  31.3 %                      36.0-46.0   MCV                       85.2 fl                     78.0-100.0   MCHC                      33.4 g/dL                   04.5-40.9   RDW                  [H]  19.3 %                      11.5-14.6   Platelet Count            160.0 K/uL                  150.0-400.0   Neutrophil %              70.4 %                      43.0-77.0   Lymphocyte %              20.7 %                      12.0-46.0   Monocyte %                7.7 %                       3.0-12.0   Eosinophils%              1.1 %                       0.0-5.0   Basophils %               0.1 %                       0.0-3.0   Neutrophill Absolute      6.7 K/uL                    1.4-7.7   Lymphocyte Absolute       2.0 K/uL                    0.7-4.0   Monocyte Absolute         0.7 K/uL                    0.1-1.0  Eosinophils, Absolute  0.1 K/uL                    0.0-0.7   Basophils Absolute        0.0 K/uL                     0.0-0.1  CXR  Procedure date:  09/01/2010  Findings:      IMPRESSION: On previous CT a 6.6 cm confluent opacity with air-fluid level was identified in right middle lobe compatible with cavitary lesion or lung abscess.  At this site there is a curvilinear density which may reflect the wall of the cyst, bulla, or cavity.  The thickness of this is 6 mm.  No air fluid level is seen within it.  There is decrease in the surrounding previous infiltrative densities. No new acute process is evident.  Impression & Recommendations:  Problem # 1:  ABSCESS, PULMONARY (ICD-513.0) Assessment Improved RML lung abscess improved with rx. All c/s neg. FOB neg.  hemoptysis after Fob resolved plan finish current abx course.    Orders: Est. Patient Level V (16109) T-2 View CXR (71020TC)  Problem # 2:  HEMOCCULT POSITIVE STOOL (ICD-578.1) Assessment: Unchanged Iron def anemia with heme pos stool plan GI referral Dr Russella Dar Orders: Gastroenterology Referral (GI)  Medications Added to Medication List This Visit: 1)  Lorazepam 0.5 Mg Tabs (Lorazepam) .... Take 1 tablet two times a day  Other Orders: TLB-CBC Platelet - w/Differential (85025-[id number])  Patient Instructions: 1)  Finish levaquin 2)  Lab today 3)  Chest xray today 4)  A referral to Dr Russella Dar will be made ASAP for blood in stool and anemia 5)  Return 1 month     Prevention & Chronic Care Immunizations   Influenza vaccine: Fluvax 3+  (09/08/2009)    Tetanus booster: Not documented    Pneumococcal vaccine: Not documented    H. zoster vaccine: Not documented  Colorectal Screening   Hemoccult: Not documented    Colonoscopy: Done  (09/27/2005)  Other Screening   Pap smear: Not documented    Mammogram: Not documented    DXA bone density scan: Not documented   Smoking status: quit > 6 months  (08/28/2010)  Lipids   Total Cholesterol: 145  (09/01/2009)   LDL: 70  (09/01/2009)   LDL Direct: 89.0  (02/12/2008)   HDL:  49.20  (09/01/2009)   Triglycerides: 127.0  (09/01/2009)    SGOT (AST): 22  (09/01/2009)   SGPT (ALT): 21  (09/01/2009)   Alkaline phosphatase: 56  (09/01/2009)   Total bilirubin: 0.6  (09/01/2009)  Hypertension   Last Blood Pressure: 120 / 56  (08/28/2010)   Serum creatinine: 1.1  (07/30/2010)   Serum potassium 4.1  (07/30/2010)  Self-Management Support :    Hypertension self-management support: Not documented    Lipid self-management support: Not documented    Nursing Instructions: Give Flu vaccine today

## 2010-12-31 NOTE — Assessment & Plan Note (Signed)
Summary: 3 month fup//ccm   Vital Signs:  Patient profile:   70 year old female Weight:      150 pounds Temp:     98.1 degrees F oral BP sitting:   110 / 70  (right arm) Cuff size:   regular  Vitals Entered By: Duard Brady LPN (June 11, 2010 8:09 AM) CC: 3 mos rov - doing well     on/off loose stool Is Patient Diabetic? No   Primary Care Provider:  Gordy Savers  MD  CC:  3 mos rov - doing well     on/off loose stool.  History of Present Illness:  70 year old patient who is in today for follow-up of her coronary artery disease.  She has dyspnea and exertion, but no exertional chest pain.  No claudication.  She has treated hypertension and dyslipidemia, which remains stable.  She has a history of anxiety, depression.    No cardiopulmonary complaints.  Only complaint is some episodic diarrhea, and stress incontinence. she had a nuclear  medicine stress test two months ago  Preventive Screening-Counseling & Management  Alcohol-Tobacco     Smoking Status: never  Allergies: 1)  Codeine Phosphate (Codeine Phosphate)  Past History:  Past Medical History: Reviewed history from 12/19/2009 and no changes required. CORONARY ARTERY DISEASE (ICD-414.00) PERIPHERAL VASCULAR DISEASE (ICD-443.9) HYPERTENSION (ICD-401.9) HYPERLIPIDEMIA (ICD-272.4) DEPRESSIVE DISORDER (ICD-311) HEALTH SCREENING (ICD-V70.0) OSTEOARTHRITIS (ICD-715.90) COLONIC POLYPS, HX OF (ICD-V12.72) lumbar spinal stenosis   GERD  Past Surgical History: L subclavian bypass  September 2005 Tubal ligation Coronary artery bypass graft July 2001 Femoral popliteal bypass R.-June 2000  status post right common iliac PTA with stent placement, September 2001 status post redo right femoral until bypass February 2002; complicated by early thrombosis, requiring thrombectomy  aorto bifemoral bypass November 2004  colonoscopy October 2006 breat bx (2) 2010 Dexa 12/09 carotid duplex- 6-09 Lumbar MRI  8-10 ABI 4-10 nuclear stress testing 5- 2011  Family History: Reviewed history from 09/08/2009 and no changes required. father died age 28.  History diabetes, cerebrovascular disease mother died at  age 69, status post hip surgery of an MI 5 brothers, one sister- one brother deceased -DM, CAD, SDAT  Positive diabetes, and coronary artery disease  Social History: Smoking Status:  never  Review of Systems       The patient complains of dyspnea on exertion.  The patient denies anorexia, fever, weight loss, weight gain, vision loss, decreased hearing, hoarseness, chest pain, syncope, peripheral edema, prolonged cough, headaches, hemoptysis, abdominal pain, melena, hematochezia, severe indigestion/heartburn, hematuria, incontinence, genital sores, muscle weakness, suspicious skin lesions, transient blindness, difficulty walking, depression, unusual weight change, abnormal bleeding, enlarged lymph nodes, angioedema, and breast masses.    Physical Exam  General:  Well-developed,well-nourished,in no acute distress; alert,appropriate and cooperative throughout examination; 108/70 Head:  Normocephalic and atraumatic without obvious abnormalities. No apparent alopecia or balding. Mouth:  Oral mucosa and oropharynx without lesions or exudates.  Teeth in good repair. Neck:  bilateral bruits Chest Wall:  No deformities, masses, or tenderness noted. status post sternotomy Lungs:  Normal respiratory effort, chest expands symmetrically. Lungs are clear to auscultation, no crackles or wheezes. Heart:  grade 2 to 3/6 systolic murmur Abdomen:  Bowel sounds positive,abdomen soft and non-tender without masses, organomegaly or hernias noted. Msk:  No deformity or scoliosis noted of thoracic or lumbar spine.   Pulses:  posterior tibial pulses absent Extremities:  No clubbing, cyanosis, edema, or deformity noted with normal full range of motion of all  joints.   Skin:  Intact without suspicious lesions or  rashes Cervical Nodes:  No lymphadenopathy noted   Impression & Recommendations:  Problem # 1:  CAD, ARTERY BYPASS GRAFT (ICD-414.04)  Her updated medication list for this problem includes:    Altace 5 Mg Caps (Ramipril) .Marland Kitchen... Take 1 capsule by mouth once a day    Felodipine 5 Mg Tb24 (Felodipine) .Marland Kitchen... 1 once daily    Imdur 30 Mg Tb24 (Isosorbide mononitrate) .Marland Kitchen... 1 once daily    Chlorthalidone 25 Mg Tabs (Chlorthalidone) ..... One daily    Cilostazol 100 Mg Tabs (Cilostazol) .Marland Kitchen... 1 tab at bedtime    Aspirin 81 Mg Tbec (Aspirin) .Marland Kitchen... Take one tablet by mouth daily    Furosemide 40 Mg Tabs (Furosemide) ..... One daily, as needed to control peripheral edema  Her updated medication list for this problem includes:    Altace 5 Mg Caps (Ramipril) .Marland Kitchen... Take 1 capsule by mouth once a day    Felodipine 5 Mg Tb24 (Felodipine) .Marland Kitchen... 1 once daily    Imdur 30 Mg Tb24 (Isosorbide mononitrate) .Marland Kitchen... 1 once daily    Chlorthalidone 25 Mg Tabs (Chlorthalidone) ..... One daily    Cilostazol 100 Mg Tabs (Cilostazol) .Marland Kitchen... 1 tab at bedtime    Aspirin 81 Mg Tbec (Aspirin) .Marland Kitchen... Take one tablet by mouth daily    Furosemide 40 Mg Tabs (Furosemide) ..... One daily, as needed to control peripheral edema  Problem # 2:  PERIPHERAL VASCULAR DISEASE (ICD-443.9)  Problem # 3:  HYPERTENSION (ICD-401.9)  Her updated medication list for this problem includes:    Altace 5 Mg Caps (Ramipril) .Marland Kitchen... Take 1 capsule by mouth once a day    Felodipine 5 Mg Tb24 (Felodipine) .Marland Kitchen... 1 once daily    Chlorthalidone 25 Mg Tabs (Chlorthalidone) ..... One daily    Furosemide 40 Mg Tabs (Furosemide) ..... One daily, as needed to control peripheral edema  Her updated medication list for this problem includes:    Altace 5 Mg Caps (Ramipril) .Marland Kitchen... Take 1 capsule by mouth once a day    Felodipine 5 Mg Tb24 (Felodipine) .Marland Kitchen... 1 once daily    Chlorthalidone 25 Mg Tabs (Chlorthalidone) ..... One daily    Furosemide 40 Mg Tabs  (Furosemide) ..... One daily, as needed to control peripheral edema  Problem # 4:  HYPERLIPIDEMIA (ICD-272.4)  Her updated medication list for this problem includes:    Simvastatin 80 Mg Tabs (Simvastatin) .Marland Kitchen... 1 once daily  Her updated medication list for this problem includes:    Simvastatin 80 Mg Tabs (Simvastatin) .Marland Kitchen... 1 once daily  Complete Medication List: 1)  Advair Diskus 250-50 Mcg/dose Misc (Fluticasone-salmeterol) .... Inhale 1 puff as directed twice a day 2)  Altace 5 Mg Caps (Ramipril) .... Take 1 capsule by mouth once a day 3)  Felodipine 5 Mg Tb24 (Felodipine) .Marland Kitchen.. 1 once daily 4)  Gabapentin 300 Mg Caps (Gabapentin) .Marland Kitchen.. 1 tab qam, 1 tab @ lunch, 2 tabs at bedtime 5)  Imdur 30 Mg Tb24 (Isosorbide mononitrate) .Marland Kitchen.. 1 once daily 6)  Lorazepam 0.5 Mg Tabs (Lorazepam) .... Take 1 tablet bid 7)  Naproxen 500 Mg Tabs (Naproxen) .Marland Kitchen.. 1 two times a day 8)  Simvastatin 80 Mg Tabs (Simvastatin) .Marland Kitchen.. 1 once daily 9)  Hydrocodone-acetaminophen 5-500 Mg Tabs (Hydrocodone-acetaminophen) .... 1/2 tab six times daily 10)  Chlorthalidone 25 Mg Tabs (Chlorthalidone) .... One daily 11)  Sertraline Hcl 50 Mg Tabs (Sertraline hcl) .... 1/2 tab at bedtime 12)  Voltaren  1 % Gel (Diclofenac sodium) .... Use once daily 13)  Fexofenadine Hcl 180 Mg Tabs (Fexofenadine hcl) .Marland Kitchen.. 1 once daily as needed 14)  Cilostazol 100 Mg Tabs (Cilostazol) .Marland Kitchen.. 1 tab at bedtime 15)  Fish Oil 1000 Mg Caps (Omega-3 fatty acids) .Marland Kitchen.. 1 cap once daily 16)  Aspirin 81 Mg Tbec (Aspirin) .... Take one tablet by mouth daily 17)  Multivitamins Tabs (Multiple vitamin) .Marland Kitchen.. 1 tab once daily 18)  Furosemide 40 Mg Tabs (Furosemide) .... One daily, as needed to control peripheral edema 19)  Omeprazole 20 Mg Tbec (Omeprazole) .Marland Kitchen.. 1 tab once daily  Patient Instructions: 1)  Please schedule a follow-up appointment in 4 months. 2)  Advised not to eat any food or drink any liquids after 10 PM the night before your  procedure. 3)  Limit your Sodium (Salt) to less than 2 grams a day(slightly less than 1/2 a teaspoon) to prevent fluid retention, swelling, or worsening of symptoms. 4)  It is important that you exercise regularly at least 20 minutes 5 times a week. If you develop chest pain, have severe difficulty breathing, or feel very tired , stop exercising immediately and seek medical attention.

## 2010-12-31 NOTE — Assessment & Plan Note (Signed)
Summary: N,V, weakness/dm   Vital Signs:  Patient profile:   70 year old female Weight:      139 pounds Temp:     98.0 degrees F oral BP sitting:   90 / 60  (left arm) Cuff size:   regular  Vitals Entered By: Duard Brady LPN (August 11, 2010 3:43 PM) CC: c/o nausea and vomiting - weakness  Is Patient Diabetic? No   Primary Care Provider:  Gordy Savers  MD  CC:  c/o nausea and vomiting - weakness .  History of Present Illness:  70 year old patient who is presently being treated for a right middle lobe lung abscess, diagnosed approximately 12 days ago.  For the past couple days.  She has had some increasing weakness and nausea. she no longer has had any hemoptysis, and denies any sputum production of significance.  No fever or chills.  She has had two episodes of vomiting over the past 3 days.  She has no working daily.  Denies any shortness of breath  Allergies: 1)  Codeine Phosphate (Codeine Phosphate)  Past History:  Past Medical History: Reviewed history from 12/19/2009 and no changes required. CORONARY ARTERY DISEASE (ICD-414.00) PERIPHERAL VASCULAR DISEASE (ICD-443.9) HYPERTENSION (ICD-401.9) HYPERLIPIDEMIA (ICD-272.4) DEPRESSIVE DISORDER (ICD-311) HEALTH SCREENING (ICD-V70.0) OSTEOARTHRITIS (ICD-715.90) COLONIC POLYPS, HX OF (ICD-V12.72) lumbar spinal stenosis   GERD  Review of Systems       The patient complains of anorexia and weight loss.  The patient denies fever, weight gain, vision loss, decreased hearing, hoarseness, chest pain, syncope, dyspnea on exertion, peripheral edema, prolonged cough, headaches, hemoptysis, abdominal pain, melena, hematochezia, severe indigestion/heartburn, hematuria, incontinence, genital sores, muscle weakness, suspicious skin lesions, transient blindness, difficulty walking, depression, unusual weight change, abnormal bleeding, enlarged lymph nodes, angioedema, and breast masses.    Physical Exam  General:   Well-developed,well-nourished,in no acute distress; alert,appropriate and cooperative throughout examination Head:  Normocephalic and atraumatic without obvious abnormalities. No apparent alopecia or balding. Eyes:  No corneal or conjunctival inflammation noted. EOMI. Perrla. Funduscopic exam benign, without hemorrhages, exudates or papilledema. Vision grossly normal. Mouth:  Oral mucosa and oropharynx without lesions or exudates.  Teeth in good repair. Neck:  bilateral bruits Chest Wall:  No deformities, masses, or tenderness noted. Lungs:  a few crackles at the right base O2 saturation 97-98% Heart:  grade 2/6 systolic murmur no tachycardia; blood pressure 90/60 Abdomen:  Bowel sounds positive,abdomen soft and non-tender without masses, organomegaly or hernias noted.   Impression & Recommendations:  Problem # 1:  ABSCESS OF LUNG (ICD-513.0)  Problem # 2:  HYPOTENSION (ICD-458.9) will hold felodipine, and chlorthalidone, and force fluids; will recheck in two days and obtain a follow-up chest x-ray at that time  Complete Medication List: 1)  Advair Diskus 250-50 Mcg/dose Misc (Fluticasone-salmeterol) .... Inhale 1 puff as directed twice a day 2)  Altace 5 Mg Caps (Ramipril) .... Take 1 capsule by mouth once a day 3)  Felodipine 5 Mg Tb24 (Felodipine) .Marland Kitchen.. 1 once daily 4)  Gabapentin 300 Mg Caps (Gabapentin) .Marland Kitchen.. 1 tab qam, 1 tab @ lunch, 2 tabs at bedtime 5)  Imdur 30 Mg Tb24 (Isosorbide mononitrate) .Marland Kitchen.. 1 once daily 6)  Lorazepam 0.5 Mg Tabs (Lorazepam) .... Take 1 tablet bid 7)  Naproxen 500 Mg Tabs (Naproxen) .Marland Kitchen.. 1 two times a day 8)  Simvastatin 80 Mg Tabs (Simvastatin) .Marland Kitchen.. 1 once daily 9)  Hydrocodone-acetaminophen 5-500 Mg Tabs (Hydrocodone-acetaminophen) .... 1/2 tab six times daily 10)  Chlorthalidone 25 Mg Tabs (Chlorthalidone) .Marland KitchenMarland KitchenMarland Kitchen  One daily 11)  Sertraline Hcl 50 Mg Tabs (Sertraline hcl) .... 1/2 tab at bedtime 12)  Voltaren 1 % Gel (Diclofenac sodium) .... Use once  daily 13)  Fexofenadine Hcl 180 Mg Tabs (Fexofenadine hcl) .Marland Kitchen.. 1 once daily as needed 14)  Cilostazol 100 Mg Tabs (Cilostazol) .Marland Kitchen.. 1 tab at bedtime 15)  Fish Oil 1000 Mg Caps (Omega-3 fatty acids) .Marland Kitchen.. 1 cap once daily 16)  Aspirin 81 Mg Tbec (Aspirin) .... Take one tablet by mouth daily 17)  Multivitamins Tabs (Multiple vitamin) .Marland Kitchen.. 1 tab once daily 18)  Furosemide 40 Mg Tabs (Furosemide) .... One daily, as needed to control peripheral edema 19)  Omeprazole 20 Mg Tbec (Omeprazole) .Marland Kitchen.. 1 tab once daily 20)  Tessalon 200 Mg Caps (Benzonatate) .... One tab every 8 hours for cough 21)  Hydromet 5-1.5 Mg/32ml Syrp (Hydrocodone-homatropine) .... 2 tsp q4- h as needed cough as needed cough 22)  Levaquin 750 Mg Tabs (Levofloxacin) .Marland Kitchen.. 1 once daily for lung infection 23)  Azithromycin 250 Mg Tabs (Azithromycin) .... Two initially, then one daily for 4 additional days 24)  Levaquin 750 Mg Tabs (Levofloxacin) .Marland Kitchen.. 1 by mouth once daily for 14 days 25)  Promethazine Hcl 25 Mg Tabs (Promethazine hcl) .... One every 6 hours for nausea  Patient Instructions: 1)  return in two days as scheduled 2)  Drink as much fluid as you can tolerate for the next few days. 3)  discontinue felodipine, and chlorthalidone, until further notice Prescriptions: PROMETHAZINE HCL 25 MG TABS (PROMETHAZINE HCL) one every 6 hours for nausea  #30 x 0   Entered and Authorized by:   Gordy Savers  MD   Signed by:   Gordy Savers  MD on 08/11/2010   Method used:   Electronically to        [facility]  [address] [phone][facility] (retail)       [address] [place], [place]  [address]       Ph: [phone] or [phone]       Fax: [phone]   RxID:   [id number] PROMETHAZINE HCL 25 MG TABS (PROMETHAZINE HCL) one every 6 hours for nausea  #30 x 0   Entered and Authorized by:   Gordy Savers  MD   Signed by:   Gordy Savers  MD on 08/11/2010   Method used:   Electronically to        [facility] [address]* (retail)       [address] [place], [place]  [address]       Ph: [phone]       Fax: [phone]   RxID:   [id number]

## 2010-12-31 NOTE — Assessment & Plan Note (Signed)
Summary: LOTS OF MUCUS NIGHTS/PS   Vital Signs:  Patient profile:   70 year old female Weight:      156 pounds Temp:     98.0 degrees F oral BP sitting:   122 / 60  (left arm)  Vitals Entered By: Raechel Ache, RN (December 19, 2009 4:35 PM) CC: C/o ?reflux- regurgitation, productive cough burning in chest   Primary Care Provider:  Gordy Savers  MD  CC:  C/o ?reflux- regurgitation and productive cough burning in chest.  History of Present Illness: 70 year old patient who is seen today confinement of reflux symptoms.  She states at night.  She develops a burning quality in her throat becomes congested with cough, which disrupt her sleep.  She has difficult time returning to sleep.  She takes omeprazole  daily, but usually after breakfast in the morning.  Her cardiac status has been stable  Allergies: 1)  Codeine Phosphate (Codeine Phosphate)  Past History:  Past Medical History: CORONARY ARTERY DISEASE (ICD-414.00) PERIPHERAL VASCULAR DISEASE (ICD-443.9) HYPERTENSION (ICD-401.9) HYPERLIPIDEMIA (ICD-272.4) DEPRESSIVE DISORDER (ICD-311) HEALTH SCREENING (ICD-V70.0) OSTEOARTHRITIS (ICD-715.90) COLONIC POLYPS, HX OF (ICD-V12.72) lumbar spinal stenosis   GERD  Review of Systems       The patient complains of prolonged cough.  The patient denies anorexia, fever, weight loss, weight gain, vision loss, decreased hearing, hoarseness, chest pain, syncope, dyspnea on exertion, peripheral edema, headaches, hemoptysis, abdominal pain, melena, hematochezia, severe indigestion/heartburn, hematuria, incontinence, genital sores, muscle weakness, suspicious skin lesions, transient blindness, difficulty walking, depression, unusual weight change, abnormal bleeding, enlarged lymph nodes, angioedema, breast masses, and testicular masses.    Physical Exam  General:  overweight-appearing.  normal blood pressure  Head:  Normocephalic and atraumatic without obvious abnormalities. No  apparent alopecia or balding. Eyes:  No corneal or conjunctival inflammation noted. EOMI. Perrla. Funduscopic exam benign, without hemorrhages, exudates or papilledema. Vision grossly normal. Mouth:  Oral mucosa and oropharynx without lesions or exudates.  Teeth in good repair. Neck:  No deformities, masses, or tenderness noted. Lungs:  Normal respiratory effort, chest expands symmetrically. Lungs are clear to auscultation, no crackles or wheezes. Heart:  Normal rate and regular rhythm. S1 and S2 normal without gallop, murmur, click, rub or other extra sounds.   Impression & Recommendations:  Problem # 1:  HYPERTENSION (ICD-401.9)  Her updated medication list for this problem includes:    Altace 5 Mg Caps (Ramipril) .Marland Kitchen... Take 1 capsule by mouth once a day    Felodipine 5 Mg Tb24 (Felodipine) .Marland Kitchen... 1 once daily    Chlorthalidone 25 Mg Tabs (Chlorthalidone) ..... One daily    Furosemide 40 Mg Tabs (Furosemide) ..... One daily, as needed to control peripheral edema  Her updated medication list for this problem includes:    Altace 5 Mg Caps (Ramipril) .Marland Kitchen... Take 1 capsule by mouth once a day    Felodipine 5 Mg Tb24 (Felodipine) .Marland Kitchen... 1 once daily    Chlorthalidone 25 Mg Tabs (Chlorthalidone) ..... One daily    Furosemide 40 Mg Tabs (Furosemide) ..... One daily, as needed to control peripheral edema  Problem # 2:  GERD (ICD-530.81)  The following medications were removed from the medication list:    Eql Omeprazole 20 Mg Tbec (Omeprazole) .Marland Kitchen... 1 once daily Her updated medication list for this problem includes:    Px Omeprazole 20 Mg Tbec (Omeprazole) ..... One twice daily  The following medications were removed from the medication list:    Eql Omeprazole 20 Mg Tbec (Omeprazole) .Marland KitchenMarland KitchenMarland KitchenMarland Kitchen  1 once daily Her updated medication list for this problem includes:    Px Omeprazole 20 Mg Tbec (Omeprazole) ..... One twice daily  Complete Medication List: 1)  Advair Diskus 250-50 Mcg/dose Misc  (Fluticasone-salmeterol) .... Inhale 1 puff as directed twice a day 2)  Altace 5 Mg Caps (Ramipril) .... Take 1 capsule by mouth once a day 3)  Felodipine 5 Mg Tb24 (Felodipine) .Marland Kitchen.. 1 once daily 4)  Gabapentin 300 Mg Caps (Gabapentin) .... 2 at bedtime one in the morning 5)  Imdur 30 Mg Tb24 (Isosorbide mononitrate) .Marland Kitchen.. 1 once daily 6)  Lorazepam 0.5 Mg Tabs (Lorazepam) .... Take 1 tablet bid 7)  Naproxen 500 Mg Tabs (Naproxen) .Marland Kitchen.. 1 two times a day 8)  Simvastatin 80 Mg Tabs (Simvastatin) .Marland Kitchen.. 1 once daily 9)  Hydrocodone-acetaminophen 5-500 Mg Tabs (Hydrocodone-acetaminophen) .... 1/2 tab six times daily 10)  Chlorthalidone 25 Mg Tabs (Chlorthalidone) .... One daily 11)  Sertraline Hcl 50 Mg Tabs (Sertraline hcl) .... 1/2 tab at bedtime 12)  Voltaren 1 % Gel (Diclofenac sodium) .... Use once daily 13)  Fexofenadine Hcl 180 Mg Tabs (Fexofenadine hcl) .Marland Kitchen.. 1 once daily as needed 14)  Cilostazol 100 Mg Tabs (Cilostazol) .Marland Kitchen.. 1 tab at bedtime 15)  Fish Oil 1000 Mg Caps (Omega-3 fatty acids) .Marland Kitchen.. 1 cap once daily 16)  Aspirin 81 Mg Tbec (Aspirin) .... Take one tablet by mouth daily 17)  Multivitamins Tabs (Multiple vitamin) .Marland Kitchen.. 1 tab once daily 18)  Furosemide 40 Mg Tabs (Furosemide) .... One daily, as needed to control peripheral edema 19)  Px Omeprazole 20 Mg Tbec (Omeprazole) .... One twice daily  Patient Instructions: 1)  Avoid foods high in acid (tomatoes, citrus juices, spicy foods). Avoid eating within two hours of lying down or before exercising. Do not over eat; try smaller more frequent meals. Elevate head of bed twelve inches when sleeping. Prescriptions: PX OMEPRAZOLE 20 MG TBEC (OMEPRAZOLE) one twice daily  #180 x 6   Entered and Authorized by:   Gordy Savers  MD   Signed by:   Gordy Savers  MD on 12/19/2009   Method used:   Print then Give to Patient   RxID:   [id number]

## 2010-12-31 NOTE — Progress Notes (Signed)
  Phone Note Other Incoming   Caller: CT scan Summary of Call: Spoke with CT scan technician.  Pt has R middle lobe 6.6 cm cavitary lesion with air-fluid levels with abscess favored over cavitary neoplasm. Also has some RLL and RUE infiltrate.  Pt recently treated with Levaquin with addition of Zithromax today.  Spoke with pt and discussed that abscess might need drainage if not improving soon.  SHe is not in resp distress and no further nausea/vomiting.  GAve her option of admission tonight and she wishes to go home at this time.  She knows to call back if symptoms worsen in any way.  Will discuss with primary in AM. Initial call taken by: Evelena Peat MD,  July 30, 2010 8:31 PM

## 2010-12-31 NOTE — Cardiovascular Report (Signed)
Summary: Outpatient Coinsurance Notice   Outpatient Coinsurance Notice   Imported By: Roderic Ovens 04/13/2010 12:43:16  _____________________________________________________________________  External Attachment:    Type:   Image     Comment:   External Document

## 2010-12-31 NOTE — Assessment & Plan Note (Signed)
Summary: cad/anas   Visit Type:  1 yr f/u Primary Provider:  Gordy Savers  MD  CC:  sob at times w/stairs or walking too much...edema at times says she sits alot...denies any cp.  History of Present Illness: [name] returns today for evaluation of her diffuse vascular disease, in particular history of coronary artery disease status post carotid bypass grafting x4 in 2001.  She is doing well except for chronic right foot pain. She does not have claudication. His multiple vascularization procedures of her lower extremities including an aortobifem, and to reduce on the right lower extremity as well. I reviewed detailed operative notes by [name].  She denies any angina or ischemic equivalence other than dyspnea on exertion. She has chronic lung disease and had a recent chest x-ray with primary care that was unremarkable otherwise. She quit smoking 10 years ago the time of her bypass.  She denies any symptoms of TIAs or mini strokes. 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 She is having no symptoms of subclavian steal. Recent Dopplers by [name] in December 2000 and showed stable bilateral internal carotid artery stenoses, patent left common carotid to subclavian bypass graft and antegrade flow in both vertebrals though there was some atypical flow the left vertebral.  Her last Myoview was in 2009. See report below.  Clinical Reports Reviewed:  Nuclear Study:  03/13/2008:  Excerise capacity: Adenosine with low level exercise  Blood Pressure response:  Clinical symptoms:  ECG impression: Baseline: NSR;. No significant ST segemnt change with adenosine and low-level exercise.  Overall impression: Normal stress nuclear study.  Alexandra Meres, MD   05/13/2004:  Final Interpretation: Normal adenosine Cardiolite with no electrocardiographic changes. The  scintigraphic results show no evidence of ischemia or infarction in any vascular territory. The gated ejection fraction was 75%, and the [name] motion was normal. It should be noted that blood pressures were checked in both arms prior to the study. In the left arm, it was 113/81; and in the right arm, it was 158/71. This differential in blood pressure will need to be followed up with Dr. Andee Mooney.  [name] Alexandra Som, MD, [facility]  09/05/2000:   ADENOSINE CARDIOLITE SPECT AND QUANTITATIVE GATED STUDY:  THE PATIENT WAS INJECTED WITH 10 mCi CARDIOLITE AT   REST, AND SPECT IMAGING WAS PERFORMED.  FOLLOWING INFUSION   OF ADENOSINE, THE PATIENT WAS REINJECTED WITH 30 mCi   CARDIOLITE AND STRESS SPECT IMAGING WAS PERFORMED.   A   QUANTITATIVE GATED STUDY WAS ALSO PERFORMED.   THE SPECT IMAGES SHOW NO EVIDENCE OF REVERSIBLE OR FIXED   MYOCARDIAL PERFUSION DEFECTS TO SUGGEST THE PRESENCE OF   MYOCARDIAL ISCHEMIA OR SCAR.   THE LEFT VENTRICULAR [name] MOTION STUDY SHOWS SEPTAL   HYPOKINESIS BUT NORMAL  SYSTOLIC MYOCARDIAL THICKENING.   THIS IS ATTRIBUTABLE TO THE PATIENT' S HISTORY OF CORONARY   ARTERY BYPASS GRAFTING.  NO OTHER REGIONAL [name] MOTION   ABNORMALITIES ARE SEEN.   THE CALCULATED LEFT VENTRICULAR EJECTION FRACTION IS 69%.   THE CALCULATED END DIASTOLIC VOLUME IS 60 ML. AND THE  CALCULATED END SYSTOLIC VOLUME IS 19 ML.   IMPRESSION:  1.  NO EVIDENCE OF PHARMACOLOGIC INDUCED   MYOCARDIAL ISCHEMIA.   2.  CALCULATED LEFT VENTRICULAR EJECTION FRACTION OF 69%.   TRANSCRIBED DATE:  [facility]  REM  Read UE:AVWU A.   [name], M.D.  09/05/00                                           Released JW:[id number]   A. [name], M.D.   Current Medications (verified): 1)  Advair Diskus 250-50 Mcg/dose Misc (Fluticasone-Salmeterol) .... Inhale 1 Puff As Directed Twice A Day 2)  Altace 5 Mg Caps (Ramipril) .... Take 1 Capsule By Mouth Once A Day 3)  Felodipine 5 Mg  Tb24 (Felodipine) .Marland Kitchen.. 1 Once Daily 4)  Gabapentin 300 Mg Caps (Gabapentin) .Marland Kitchen.. 1 Tab Qam, 1 Tab @ Lunch, 2 Tabs At Bedtime 5)  Imdur 30 Mg Tb24 (Isosorbide Mononitrate) .Marland Kitchen.. 1 Once Daily 6)  Lorazepam 0.5 Mg Tabs (Lorazepam) .... Take 1 Tablet Bid 7)  Naproxen 500 Mg Tabs (Naproxen) .Marland Kitchen.. 1 Two Times A Day 8)  Simvastatin 80 Mg  Tabs (Simvastatin) .Marland Kitchen.. 1 Once Daily 9)  Hydrocodone-Acetaminophen 5-500 Mg  Tabs (Hydrocodone-Acetaminophen) .... 1/2 Tab Six Times Daily 10)  Chlorthalidone 25 Mg  Tabs (Chlorthalidone) .... One Daily 11)  Sertraline Hcl 50 Mg  Tabs (Sertraline Hcl) .... 1/2 Tab At Bedtime 12)  Voltaren 1 % Gel (Diclofenac Sodium) .... Use Once Daily 13)  Fexofenadine Hcl 180 Mg Tabs (Fexofenadine Hcl) .Marland Kitchen.. 1 Once Daily As Needed 14)  Cilostazol 100 Mg Tabs (Cilostazol) .Marland Kitchen.. 1 Tab At Bedtime 15)  Fish Oil 1000 Mg Caps (Omega-3 Fatty Acids) .Marland Kitchen.. 1 Cap Once Daily 16)  Aspirin 81 Mg Tbec (Aspirin) .... Take One Tablet By Mouth Daily 17)  Multivitamins   Tabs (Multiple Vitamin) .Marland Kitchen.. 1 Tab Once Daily 18)  Furosemide 40 Mg Tabs (Furosemide) .... One Daily, As Needed To Control Peripheral Edema 19)  Omeprazole 20 Mg Tbec (Omeprazole) .Marland Kitchen.. 1 Tab Once Daily  Allergies: 1)  Codeine Phosphate (Codeine Phosphate)  Past History:  Past Medical History: Last updated: 12/19/2009 CORONARY ARTERY DISEASE (ICD-414.00) PERIPHERAL VASCULAR DISEASE (ICD-443.9) HYPERTENSION (ICD-401.9) HYPERLIPIDEMIA (ICD-272.4) DEPRESSIVE DISORDER (ICD-311) HEALTH SCREENING (ICD-V70.0) OSTEOARTHRITIS (ICD-715.90) COLONIC POLYPS, HX OF (ICD-V12.72) lumbar spinal stenosis   GERD  Past Surgical History: Last updated: 2009/09/25 L subclavian bypass  September 2005 Tubal ligation Coronary artery bypass graft July 2001 Femoral popliteal bypass R.-June 2000  status post right common iliac PTA with stent placement, September 2001 status post redo right femoral until bypass February 2002; complicated by  early thrombosis, requiring thrombectomy  aorto bifemoral bypass November 2004  colonoscopy October 2006 breat bx (2) 2010 Dexa 12/09 carotid duplex- 6-09 Lumbar MRI 8-10 ABI 4-10  Family History: Last updated: Sep 25, 2009 father died age 59.  History diabetes, cerebrovascular disease mother died at  age 41, status post hip surgery of an MI 5 brothers, one sister- one brother deceased -DM, CAD, SDAT  Positive diabetes, and coronary artery disease  Social History: Last updated: 02/12/2008 Former Smoker Divorced  Risk Factors: Smoking Status: quit (09-25-2009)  Review of Systems       negative other than history of present illness  Vital Signs:  Patient profile:   70 year old female Height:      62.5 inches Weight:      152 pounds BMI:     27.46 Pulse rate:   90 / minute Pulse rhythm:   regular BP sitting:   110 / 60  (left arm) Cuff size:   large  Vitals Entered By: Danielle Rankin, CMA (Apr 01, 2010 1:48 PM)  Physical Exam  General:  obese.  obese.   Head:  normocephalic and atraumatic Eyes:  PERRLA/EOM intact; conjunctiva and lids normal. Neck:  Neck supple, no JVD. No masses, thyromegaly or abnormal cervical nodes. Chest Marvie Calender:  no deformities or breast masses noted Lungs:  dbreath sounds throughout but clear Heart:  PMI nondisplaced, normal S1-S2, no gallop, soft sounds however. Bilateral carotid bruits with a left supraclavicular bruit as well. Pulses intact in the upper extremities Msk:  Back normal, normal gait. Muscle strength and tone normal. Pulses:  dorsalis pedis 1-2+ over 4+ bilaterally. Posterior tibials also palpable but less so. Extremities:   trace left pedal edema.  trace right pedal edema.  superficial varicosities right lower joint greater than left. No sign of DVT Neurologic:  Alert and oriented x 3. Skin:  Intact without lesions or rashes. Psych:  Normal affect.   Problems:  Medical Problems Added: 1)  Dx of Cad, Artery Bypass Graft   (ICD-414.04)  EKG  Procedure date:  04/01/2010  Findings:      normal sinus rhythm, normal EKG  Impression & Recommendations:  Problem # 1:  CORONARY ARTERY DISEASE (ICD-414.00) I have recommended a surveillance Myoview since his been 2 years since we've had objective assessment of her coronary disease. Distant exertion may be an anginal equivalent. This is negative for skin L. see her back in a year. Her updated medication list for this problem includes:    Altace 5 Mg Caps (Ramipril) .Marland Kitchen... Take 1 capsule by mouth once a day    Felodipine 5 Mg Tb24 (Felodipine) .Marland Kitchen... 1 once daily    Imdur 30 Mg Tb24 (Isosorbide mononitrate) .Marland Kitchen... 1 once daily    Cilostazol 100 Mg Tabs (Cilostazol) .Marland Kitchen... 1 tab at bedtime    Aspirin 81 Mg Tbec (Aspirin) .Marland Kitchen... Take one tablet by mouth daily  Her updated medication list for this problem includes:    Altace 5 Mg Caps (Ramipril) .Marland Kitchen... Take 1 capsule by mouth once a day    Felodipine 5 Mg Tb24 (Felodipine) .Marland Kitchen... 1 once daily    Imdur 30 Mg Tb24 (Isosorbide mononitrate) .Marland Kitchen... 1 once daily    Cilostazol 100 Mg Tabs (Cilostazol) .Marland Kitchen... 1 tab at bedtime    Aspirin 81 Mg Tbec (Aspirin) .Marland Kitchen... Take one tablet by mouth daily  Problem # 2:  CAD, ARTERY BYPASS GRAFT (ICD-414.04) Assessment: Unchanged  Her updated medication list for this problem includes:    Altace 5 Mg Caps (Ramipril) .Marland Kitchen... Take 1 capsule by mouth once a day    Felodipine 5 Mg Tb24 (Felodipine) .Marland Kitchen... 1 once daily    Imdur 30 Mg Tb24 (Isosorbide mononitrate) .Marland Kitchen... 1 once daily    Cilostazol 100 Mg Tabs (Cilostazol) .Marland Kitchen... 1 tab at bedtime    Aspirin 81 Mg Tbec (Aspirin) .Marland Kitchen... Take one tablet by mouth daily  Orders: EKG w/ Interpretation (93000) Nuclear Stress Test (Nuc Stress Test)  Problem # 3:  HYPERTENSION (ICD-401.9) Assessment: Improved  Her updated medication list for this problem includes:    Altace 5 Mg Caps (Ramipril) .Marland Kitchen... Take 1 capsule by mouth once a day    Felodipine 5 Mg  Tb24 (Felodipine) .Marland Kitchen... 1 once daily    Chlorthalidone 25 Mg Tabs (Chlorthalidone) ..... One daily    Aspirin 81 Mg Tbec (Aspirin) .Marland Kitchen... Take one tablet by mouth daily    Furosemide 40 Mg Tabs (Furosemide) ..... One daily, as needed to control peripheral edema  Her updated medication list for this  problem includes:    Altace 5 Mg Caps (Ramipril) .Marland Kitchen... Take 1 capsule by mouth once a day    Felodipine 5 Mg Tb24 (Felodipine) .Marland Kitchen... 1 once daily    Chlorthalidone 25 Mg Tabs (Chlorthalidone) ..... One daily    Aspirin 81 Mg Tbec (Aspirin) .Marland Kitchen... Take one tablet by mouth daily    Furosemide 40 Mg Tabs (Furosemide) ..... One daily, as needed to control peripheral edema  Problem # 4:  TOBACCO USE, QUIT (ICD-V15.82) Assessment: Improved  Problem # 5:  HYPERLIPIDEMIA (ICD-272.4) I reviewed her labs with her thrombi primary care in October of 2010. Her lipids are at goal and LFTs are stable. Her creatinine was 1.2. Potassium is 3.7. CBC was unremarkable. Her updated medication list for this problem includes:    Simvastatin 80 Mg Tabs (Simvastatin) .Marland Kitchen... 1 once daily  Her updated medication list for this problem includes:    Simvastatin 80 Mg Tabs (Simvastatin) .Marland Kitchen... 1 once daily  Problem # 6:  PEDAL EDEMA (ICD-782.3) Assessment: Improved  Problem # 7:  PERIPHERAL VASCULAR DISEASE (ICD-443.9) Assessment: Unchanged  Patient Instructions: 1)  Your physician recommends that you schedule a follow-up appointment in: YEAR WITH DR Dianara Smullen 2)  Your physician recommends that you continue on your current medications as directed. Please refer to the Current Medication list given to you today. 3)  Your physician has requested that you have an LEXISCAN stress myoview.  For further information please visit [email or web address]  Please follow instruction sheet, as given.

## 2010-12-31 NOTE — Letter (Signed)
Summary: Colonoscopy Letter  [facility]  [address] [place], [place] [address]   Phone: [phone]  Fax: [phone]      July 29, 2010 MRN: [id number]   [facility] [address] [place], [place]  [address]   Dear Ms. Vonruden,   According to your medical record, it is time for you to schedule a Colonoscopy. The American Cancer Society recommends this procedure as a method to detect early colon cancer. Patients with a family history of colon cancer, or a personal history of colon polyps or inflammatory bowel disease are at increased risk.  This letter has beeen generated based on the recommendations made at the time of your procedure. If you feel that in your particular situation this may no longer apply, please contact our office.  Please call our office at [phone] to schedule this appointment or to update your records at your earliest convenience.  Thank you for cooperating with [place] to provide you with the very best care possible.   Sincerely,  Judie Petit T. Russella Dar, M.D.  [facility] [phone]

## 2010-12-31 NOTE — Assessment & Plan Note (Signed)
Summary: cxr f/u     kik   Vital Signs:  Patient profile:   70 year old female Weight:      136 pounds Temp:     97.5 degrees F BP sitting:   100 / 64  (right arm) Cuff size:   regular  Vitals Entered By: Duard Brady LPN (August 13, 2010 9:13 AM) CC: f/u on CXR     c/o nausea and vomiting  Is Patient Diabetic? No   Primary Care Provider:  Gordy Savers  MD  CC:  f/u on CXR     c/o nausea and vomiting .  History of Present Illness: 70 year old patient who is seen today for follow-up of her right middle lobe abscess and right lower lobe pneumonia.  She is somewhat improved.  No real cough and shortness of breath and no further hemoptysis.  Complaints today include altered taste sensation some nausea with rare emesis and sore mouth.  In addition to antibiotic therapy.  She has been on Advair.  Follow chest x-ray was obtained earlier today that revealed improvement in the right middle lobe abscess and right lower lobe pneumonia.  She remained slightly weak.  Earlier, multiple medications were held including felodipine, chlorthalidone, and furosemide.  She has completed two weeks of antibiotic therapy with Levaquin  Allergies: 1)  Codeine Phosphate (Codeine Phosphate)  Past History:  Past Medical History: CORONARY ARTERY DISEASE (ICD-414.00) PERIPHERAL VASCULAR DISEASE (ICD-443.9) HYPERTENSION (ICD-401.9) HYPERLIPIDEMIA (ICD-272.4) DEPRESSIVE DISORDER (ICD-311) HEALTH SCREENING (ICD-V70.0) OSTEOARTHRITIS (ICD-715.90) COLONIC POLYPS, HX OF (ICD-V12.72) lumbar spinal stenosis  right middle lobe abscess September 2011 GERD  Review of Systems       The patient complains of anorexia.  The patient denies fever, weight loss, weight gain, vision loss, decreased hearing, hoarseness, chest pain, syncope, dyspnea on exertion, peripheral edema, prolonged cough, headaches, hemoptysis, abdominal pain, melena, hematochezia, severe indigestion/heartburn, hematuria, incontinence,  genital sores, muscle weakness, suspicious skin lesions, transient blindness, difficulty walking, depression, unusual weight change, abnormal bleeding, enlarged lymph nodes, angioedema, and breast masses.    Physical Exam  General:  Well-developed,well-nourished,in no acute distress; alert,appropriate and cooperative throughout examination Head:  Normocephalic and atraumatic without obvious abnormalities. No apparent alopecia or balding. Eyes:  No corneal or conjunctival inflammation noted. EOMI. Perrla. Funduscopic exam benign, without hemorrhages, exudates or papilledema. Vision grossly normal. Mouth:  mild gingival erythema, but no obvious thrush Neck:  No deformities, masses, or tenderness noted. Lungs:  Normal respiratory effort, chest expands symmetrically. Lungs are clear to auscultation, no crackles or wheezes. Heart:  Normal rate and regular rhythm. S1 and S2 normal without gallop, murmur, click, rub or other extra sounds.   Impression & Recommendations:  Problem # 1:  ABSCESS, PULMONARY (ICD-513.0)  will continue on antibiotic therapy and schedule pulmonary follow up for next week; will treat empirically with Dukes Magic mouthwash for possible thrush  Orders: Pulmonary Referral (Pulmonary)  Problem # 2:  HYPOTENSION (ICD-458.9)  Complete Medication List: 1)  Advair Diskus 250-50 Mcg/dose Misc (Fluticasone-salmeterol) .... Inhale 1 puff as directed twice a day 2)  Altace 5 Mg Caps (Ramipril) .... Take 1 capsule by mouth once a day 3)  Felodipine 5 Mg Tb24 (Felodipine) .Marland Kitchen.. 1 once daily 4)  Gabapentin 300 Mg Caps (Gabapentin) .Marland Kitchen.. 1 tab qam, 1 tab @ lunch, 2 tabs at bedtime 5)  Imdur 30 Mg Tb24 (Isosorbide mononitrate) .Marland Kitchen.. 1 once daily 6)  Lorazepam 0.5 Mg Tabs (Lorazepam) .... Take 1 tablet bid 7)  Simvastatin 80 Mg Tabs (  Simvastatin) .Marland Kitchen.. 1 once daily 8)  Hydrocodone-acetaminophen 5-500 Mg Tabs (Hydrocodone-acetaminophen) .... 1/2 tab six times daily 9)  Chlorthalidone 25  Mg Tabs (Chlorthalidone) .... One daily 10)  Sertraline Hcl 50 Mg Tabs (Sertraline hcl) .... 1/2 tab at bedtime 11)  Voltaren 1 % Gel (Diclofenac sodium) .... Use once daily 12)  Fexofenadine Hcl 180 Mg Tabs (Fexofenadine hcl) .Marland Kitchen.. 1 once daily as needed 13)  Cilostazol 100 Mg Tabs (Cilostazol) .Marland Kitchen.. 1 tab at bedtime 14)  Fish Oil 1000 Mg Caps (Omega-3 fatty acids) .Marland Kitchen.. 1 cap once daily 15)  Aspirin 81 Mg Tbec (Aspirin) .... Take one tablet by mouth daily 16)  Multivitamins Tabs (Multiple vitamin) .Marland Kitchen.. 1 tab once daily 17)  Furosemide 40 Mg Tabs (Furosemide) .... One daily, as needed to control peripheral edema 18)  Omeprazole 20 Mg Tbec (Omeprazole) .Marland Kitchen.. 1 tab once daily 19)  Tessalon 200 Mg Caps (Benzonatate) .... One tab every 8 hours for cough 20)  Hydromet 5-1.5 Mg/47ml Syrp (Hydrocodone-homatropine) .... 2 tsp q4- h as needed cough as needed cough 21)  Levaquin 750 Mg Tabs (Levofloxacin) .Marland Kitchen.. 1 once daily for lung infection 22)  Promethazine Hcl 25 Mg Tabs (Promethazine hcl) .... One every 6 hours for nausea 23)  First-dukes Mouthwash Susp (Diphenhyd-hydrocort-nystatin) .Marland Kitchen.. 1 teaspoon swish and swallow 4 times daily  Patient Instructions: 1)  pulmonary follow-up next week as scheduled 2)  Please schedule a follow-up appointment in 1 month. Prescriptions: LEVAQUIN 750 MG TABS (LEVOFLOXACIN) 1 once daily for lung infection  #7 x 0   Entered and Authorized by:   Gordy Savers  MD   Signed by:   Gordy Savers  MD on 08/13/2010   Method used:   Electronically to        [facility] [phone][facility] (retail)       [address]       [place], [place]  [address]       Ph: [phone] or [phone]       Fax: [phone]   RxID:   [id number] FIRST-DUKES MOUTHWASH  SUSP (DIPHENHYD-HYDROCORT-NYSTATIN) 1 teaspoon swish and swallow 4 times daily  #6 oz x 2   Entered and Authorized by:   Gordy Savers  MD   Signed by:   Gordy Savers  MD on 08/13/2010    Method used:   Electronically to        [facility]  [address] [phone]* (retail)       [address] [place], [place]  [address]       Ph: [phone] or [phone]       Fax: [phone]   RxID:   [id number] FIRST-DUKES MOUTHWASH  SUSP (DIPHENHYD-HYDROCORT-NYSTATIN) 1 teaspoon swish and swallow 4 times daily  #6 oz x 2   Entered and Authorized by:   Gordy Savers  MD   Signed by:   Gordy Savers  MD on 08/13/2010   Method used:   Electronically to        [facility] [phone]* (retail)       [address] [place], [place]  [address]       Ph: [phone]       Fax: [phone]   RxID:   [id number] LEVAQUIN 750 MG  TABS (LEVOFLOXACIN) 1 once daily for lung infection  #7 x 0   Entered and Authorized by:   Gordy Savers  MD   Signed by:   Gordy Savers  MD on 08/13/2010   Method used:   Electronically to        [facility] [address] (retail)       [address] [place], [place]  [address]       Ph: [phone]       Fax: [phone]   RxID:   [id number]

## 2010-12-31 NOTE — Progress Notes (Signed)
Summary: refill lorazopam  Phone Note Refill Request   Refills Requested: Medication #1:  LORAZEPAM 0.5 MG TABS Take 1 tablet bid rx has expired - [facility] [phone]   Method Requested: Telephone to Pharmacy Initial call taken by: Duard Brady LPN,  June 10, 2010 12:16 PM    Prescriptions: LORAZEPAM 0.5 MG TABS (LORAZEPAM) Take 1 tablet bid  #180 x 1   Entered and Authorized by:   Duard Brady LPN   Signed by:   Duard Brady LPN on 08/65/7846   Method used:   Historical   RxID:   [id number]  faxed to [facility]

## 2010-12-31 NOTE — Progress Notes (Signed)
Summary: [facility] Report    Office Message from Date: Time of Call: Faxed To: [facility] Caller: Fax Number: [phone] Facility: n/a Patient: [name], [name] DOB: [birth year] Phone: [phone] Provider: Message: [facility] is calling with results for CT Scan with contrast ordered by Dr. Amador Cunas. The results are abnormal and were read by Dr. Azucena Kuba. Regarding Appointment: Appt Date: Appt Time: Unknown Provider: Reason: Details: Outcome: Message Taken by: Ara Kussmaul, CSR FAX [facility]  [address] [place], [place] [address]  P: [phone]  F: [phone]

## 2010-12-31 NOTE — Assessment & Plan Note (Signed)
Summary: Pulmonary OV   Copy to:  [name], MD Primary Provider/Referring Provider:  [name], MD   CC:  2 month follow up.  c/o slight difficulty breathing when going up stairs x 2-3 wks.  Denies wheezing, chest tightness, and cough..  History of Present Illness: 70 yo WF with RML lung abscess no other risk factors and neg FOB for endobronchial lesion 9/11. Mild COPD ex smoker 60pack years. Normal FeV1, Fef25-75 39% predicted  November 25, 2010 10:27 AM The pt is now noting more dyspnea times two weeks when walking up stairs.  The pt  had a "cold" two weeks ago and is worse since that time.  There is no cough now.  No mucus.  No fever. No real chest pain.  There is   no wheezing.  Pt was on advair in the past but is now off .  The pt does have proair and uses this as needed .   There is no recurrent chest pain such as she had with lung abscess.     Preventive Screening-Counseling & Management  Alcohol-Tobacco     Smoking Status: quit > 6 months     Smoke Cessation Stage: quit     Packs/Day: 1.5     Year Started: 1960     Year Quit: 06/2000     Pack years: 3  Current Medications (verified): 1)  Altace 5 Mg Caps (Ramipril) .... Take 1 Capsule By Mouth Once A Day 2)  Felodipine 5 Mg Tb24 (Felodipine) .Marland Kitchen.. 1 Once Daily 3)  Gabapentin 600 Mg Tabs (Gabapentin) .... Take 1 Tablet By Mouth Three Times A Day 4)  Imdur 30 Mg Tb24 (Isosorbide Mononitrate) .Marland Kitchen.. 1 Once Daily 5)  Lorazepam 0.5 Mg Tabs (Lorazepam) .... Take 1 Tablet Two Times A Day 6)  Simvastatin 80 Mg  Tabs (Simvastatin) .Marland Kitchen.. 1 Once Daily 7)  Hydrocodone-Acetaminophen 5-500 Mg  Tabs (Hydrocodone-Acetaminophen) .... 1/2 Tab Six Times Daily 8)  Chlorthalidone 25 Mg  Tabs (Chlorthalidone) .... One Daily 9)  Sertraline Hcl 50 Mg  Tabs (Sertraline Hcl) .... 1/2 Tab At Bedtime 10)  Voltaren 1 % Gel (Diclofenac Sodium) .... Use Once Daily 11)  Fexofenadine Hcl 180 Mg Tabs (Fexofenadine Hcl) .Marland Kitchen.. 1 Once Daily As  Needed 12)  Fish Oil 1000 Mg Caps (Omega-3 Fatty Acids) .Marland Kitchen.. 1 Cap Once Daily 13)  Aspirin 81 Mg Tbec (Aspirin) .... Take One Tablet By Mouth Daily 14)  Multivitamins   Tabs (Multiple Vitamin) .Marland Kitchen.. 1 Tab Once Daily 15)  Furosemide 40 Mg Tabs (Furosemide) .... One Daily, As Needed To Control Peripheral Edema 16)  Omeprazole 20 Mg Tbec (Omeprazole) .Marland Kitchen.. 1 Tab Once Daily 17)  Promethazine Hcl 25 Mg Tabs (Promethazine Hcl) .... One Every 6 Hours For Nausea As Needed 18)  Tylenol 325 Mg Tabs (Acetaminophen) .... As Needed 19)  Proair Hfa 108 (90 Base) Mcg/act  Aers (Albuterol Sulfate) .Marland Kitchen.. 1-2 Puffs Every 4-6 Hours As Needed  Allergies (verified): 1)  Codeine Phosphate (Codeine Phosphate)  Past History:  Past medical, surgical, family and social histories (including risk factors) reviewed, and no changes noted (except as noted below).  Past Medical History: Reviewed history from 09/09/2010 and no changes required. CORONARY ARTERY DISEASE (ICD-414.00) PERIPHERAL VASCULAR DISEASE (ICD-443.9) HYPERTENSION (ICD-401.9) HYPERLIPIDEMIA (ICD-272.4) DEPRESSIVE DISORDER (ICD-311) OSTEOARTHRITIS (ICD-715.90) COLONIC POLYPS, HX OF (ICD-V12.72) lumbar spinal stenosis  right middle lobe abscess September 2011    -resolved on CXR 09/01/10    -neg FOB for endobronchial lesion RML GERD Anemia  Congestive Heart Failure  Past Surgical History: Reviewed history from 08/18/2010 and no changes required. L subclavian bypass  September 2005 Tubal ligation Coronary artery bypass graft July 2001 Femoral popliteal bypass R.-June 2000  status post right common iliac PTA with stent placement, September 2001 status post redo right femoral l bypass February 2002; complicated by early thrombosis, requiring thrombectomy  aorto bifemoral bypass November 2004  colonoscopy October 2006 breast  bx (2) 2010 benign Dexa 12/09 carotid duplex- 6-09 Lumbar MRI 8-10 ABI 4-10 nuclear stress testing 5- 2011   negative for ischemia  Clinical Reports Reviewed:  PFT's:  09/25/2010: FEF 25/75 %Predicted:  39.534 FEV1 %Predicted:  69.839 FEV1/FVC %Predicted:  84.710 FVC %Predicted:  81.944  CXR:  09/25/2010: CXR Results:  IMPRESSION: Persistent abscess cavity lucency in the right middle lobe appears slightly smaller now and the cavity wall appears thinner.    09/01/2010: CXR Results:  IMPRESSION: On previous CT a 6.6 cm confluent opacity with air-fluid level was identified in right middle lobe compatible with cavitary lesion or lung abscess.  At this site there is a curvilinear density which may reflect the wall of the cyst, bulla, or cavity.  The thickness of this is 6 mm.  No air fluid level is seen within it.  There is decrease in the surrounding previous infiltrative densities. No new acute process is evident.  08/21/2010: CXR Results:  RML abscess, no PTX or effusion  08/13/2010: CXR Results:  IMPRESSION: Interval reduction in size of the right middle lobe abscess since the examinations 2 weeks ago, though a cavitary opacity persists. Improved pneumonia in the right lower lobe.  No new pulmonary parenchymal abnormalities.  03/17/2010: CXR Results:    Clinical Data: History of congestion, coughing and shortness of   breath.  History of hypertension.  History of prior tobacco   smoking.    CHEST - 2 VIEW    Comparison: 05/08/2009 study.    Findings: The cardiac silhouette is normal size and shape. The   patient has undergone previous median sternotomy and coronary   artery bypass grafting. Nonaneurysmal aortic calcification is   present. The lungs are well aerated and free of infiltrates.   No pleural abnormality is evident. There is a mildly osteopenic   appearance of the bones. There is mild degenerative spondylosis   compatible with age.    IMPRESSION:   No acute or active cardiopulmonary process is identified.  Chronic   stable changes are detailed above.     Read By:  Crawford Givens,  M.D.   Released By:  Crawford Givens,  M.D.  07/09/2004: CXR Results:   Clinical Data:    Preprocedure respiratory exam for left subclavian   arterial stenosis.   CHEST (TWO VIEWS) 07/09/04   No prior studies currently available for comparison.   Heart size normal.  The patient has had prior CABG.  No evidence of   infiltrate, nodule or pleural fluid.  Bony thorax is unremarkable.   IMPRESSION   No active disease.    Read By:  Irish Lack,  M.D.   Released By:  Irish Lack,  M.D.  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   PORTABLE CHEST   Single portable view of the chest compared to previous study from   yesterday.   Swan-Ganz catheter tip is in the descending pulmonary artery on the   right, and stable.  Heart size is normal.  Endotracheal tube and   nasogastric tube have been removed.  There is minimal bibasilar   atelectasis, left greater than right. No pneumothorax.   IMPRESSION   Removal of endotracheal tube and nasogastric tube with slight   increase in left bibasilar atelectasis.   Swan-Ganz catheter is stable.    Read By:  Cristi Loron,  M.D.   Released By:  Cristi Loron,  M.D.   Family History: Reviewed history from 09/09/2010 and no changes required. father died age 50.  History diabetes, cerebrovascular disease mother died at  age 68, status post hip surgery of an MI 5 brothers, one sister- one brother deceased -DM, CAD, SDAT Positive diabetes, and coronary artery disease No FH of Colon Cancer:  Social History: Reviewed history from 09/09/2010 and no changes required. Former Smoker.  1 1/2 x 40 yrs.  Quit in 8 2001 2 children Works at [employer] as Scientist, physiological  Widowed Alcohol Use - no Daily Caffeine Use: 2 daily  Illicit Drug Use - no  Review of Systems       The patient complains of shortness of  breath with activity and acid heartburn.  The patient denies shortness of breath at rest, productive cough, non-productive cough, coughing up blood, chest pain, irregular heartbeats, indigestion, loss of appetite, weight change, abdominal pain, difficulty swallowing, sore throat, tooth/dental problems, headaches, nasal congestion/difficulty breathing through nose, sneezing, itching, ear ache, anxiety, depression, hand/feet swelling, joint stiffness or pain, rash, change in color of mucus, and fever.    Vital Signs:  Patient profile:   70 year old female Height:      62.5 inches Weight:      143.25 pounds BMI:     25.88 O2 Sat:      95 % on Room air Temp:     97.7 degrees F oral Pulse rate:   81 / minute BP sitting:   116 / 54  (left arm) Cuff size:   regular  Vitals Entered By: Gweneth Dimitri RN (November 25, 2010 10:07 AM)  O2 Flow:  Room air CC: 2 month follow up.  c/o slight difficulty breathing when going up stairs x 2-3 wks.  Denies wheezing, chest tightness, cough. Comments Medications reviewed with patient Daytime contact number verified with patient. Gweneth Dimitri RN  November 25, 2010 10:08 AM    Physical Exam  Additional Exam:  Gen: Pleasant, well-nourished, in no distress,  normal affect ENT: No lesions,  mouth clear,  oropharynx clear, no postnasal drip Neck: No JVD, no TMG, no carotid bruits Lungs: No use of accessory muscles, no dullness to percussion,  distant BS Cardiovascular: RRR, heart sounds normal, no murmur or gallops, no peripheral edema Abdomen: soft and NT, no HSM,  BS normal Musculoskeletal: No deformities, no cyanosis or clubbing Neuro: alert, non focal Skin: Warm, no lesions or rashes    Impression & Recommendations:  Problem # 1:  COPD, MILD (ICD-496) Assessment Unchanged  mild airflow obstruction likely cause of dyspnea on exertion  plan cont to stay off advair and use proair SABA as needed   Problem # 2:  ABSCESS, PULMONARY  (ICD-513.0) Assessment: Improved  Improved lung abscess.  Off all antibiotics, neg FOB for endobronchial lesion plan observation for now  Medications Added to Medication List This Visit: 1)  Gabapentin 600 Mg Tabs (Gabapentin) .... Take 1 tablet by mouth three times a day  Complete Medication List: 1)  Altace 5 Mg Caps (Ramipril) .... Take 1 capsule by mouth once a  day 2)  Felodipine 5 Mg Tb24 (Felodipine) .Marland Kitchen.. 1 once daily 3)  Gabapentin 600 Mg Tabs (Gabapentin) .... Take 1 tablet by mouth three times a day 4)  Imdur 30 Mg Tb24 (Isosorbide mononitrate) .Marland Kitchen.. 1 once daily 5)  Lorazepam 0.5 Mg Tabs (Lorazepam) .... Take 1 tablet two times a day 6)  Simvastatin 80 Mg Tabs (Simvastatin) .Marland Kitchen.. 1 once daily 7)  Hydrocodone-acetaminophen 5-500 Mg Tabs (Hydrocodone-acetaminophen) .... 1/2 tab six times daily 8)  Chlorthalidone 25 Mg Tabs (Chlorthalidone) .... One daily 9)  Sertraline Hcl 50 Mg Tabs (Sertraline hcl) .... 1/2 tab at bedtime 10)  Voltaren 1 % Gel (Diclofenac sodium) .... Use once daily 11)  Fexofenadine Hcl 180 Mg Tabs (Fexofenadine hcl) .Marland Kitchen.. 1 once daily as needed 12)  Fish Oil 1000 Mg Caps (Omega-3 fatty acids) .Marland Kitchen.. 1 cap once daily 13)  Aspirin 81 Mg Tbec (Aspirin) .... Take one tablet by mouth daily 14)  Multivitamins Tabs (Multiple vitamin) .Marland Kitchen.. 1 tab once daily 15)  Furosemide 40 Mg Tabs (Furosemide) .... One daily, as needed to control peripheral edema 16)  Omeprazole 20 Mg Tbec (Omeprazole) .Marland Kitchen.. 1 tab once daily 17)  Promethazine Hcl 25 Mg Tabs (Promethazine hcl) .... One every 6 hours for nausea as needed 18)  Tylenol 325 Mg Tabs (Acetaminophen) .... As needed 19)  Proair Hfa 108 (90 Base) Mcg/act Aers (Albuterol sulfate) .Marland Kitchen.. 1-2 puffs every 4-6 hours as needed  Other Orders: Est. Patient Level III (33295)  Patient Instructions: 1)  Stay on proair as needed 2)  No other medication changes 3)  Return 6 months

## 2010-12-31 NOTE — Assessment & Plan Note (Signed)
Summary: Admission History and Physical    Copy to:  [name] MD Primary [place]/Referring Felicidad Sugarman:  Gordy Savers  MD   History of Present Illness: Admission Hx and Physical:  Place in front of Progress Note section       This is a 70 year old female who presents for consultation at the request of [name] MD for initial Pulmonary consult.  The patient presents with fever, fatigue, failure to thrive, weight loss, night sweats, chest pain, cough, shortness of breath, shortness of breath with exertion, and hemoptysis, but has no history of nosebleeds, easy bruising, heartburn, nausea/vomiting, unable to take oral fluids/nourishment, joint swelling, leg swelling, peripheral edema, and URI symptoms.  The patient comes in today for evaluation of coughing up blood, coughing up sputum, shortness of breath, shortness of breath with exertion, chest congestion, fever, and fatigue.  Today's visit is for evaluation and management.  The patient's current symptoms are improving and do not impair daily activities.  Prior evaluation and treatment (see reports for full details) has included CXR and CT scan of chest.  The patient notes good compliance with treatment plan, a good response to treatment, and without side effects.  The patient has questions or concerns today regarding diagnosis and prognosis.       This pt is referred for abn CT scan.  Pt had low grade fever and cough since 6/11.  Mucus early on was yellow to green.  There was low grade fever but no chills or sweats.  Pt then coughed up blood after Labor Day.  BRB that went on for one week.  The blood is mixed with mucus.  Now with prolonged course of levaquin the pt is better.  There is less cough and dyspnea.    The cxr and CT chest showed RML abscess.  This is getting smaller on the film.    August 21, 2010 8:59 AM Admission Hx/Px This pt was entered for FOB.  RML is patent but inflammed.  AFter first Bx the pt  developed 75cc hemorrhage that was controlled with topical 6cc 1:10,000epi and 5000units thrombin.  Pt admitted overnight for observation.  Recent hx is as above per recent consult.      Preventive Screening-Counseling & Management  Alcohol-Tobacco     Smoking Status: quit > 6 months     Smoke Cessation Stage: quit     Packs/Day: 1.5     Year Started: 1960     Year Quit: 06/2000     Pack years: 56  Clinical Reports Reviewed:  CT of Chest:  07/30/2010: CT of Chest:  Findings: Mild to moderate patchy opacity throughout the majority of the right lower lobe.  More confluent, dense opacity in the right middle lobe, containing an air-fluid level.  This area measures 6.6 x 4.0 cm in maximum dimensions on image number 32. Small amount of patchy and linear density in the inferior right upper lobe.  Linear opacity in the left upper lobe.  Bullous changes throughout both lungs.  Enlarged right hilar lymph node, measuring 2.2 x 1.5 cm in maximum dimensions on image number 31. Unremarkable upper abdomen.  Mild thoracic spine degenerative changes.   IMPRESSION:   1.  6.6 cm confluent opacity with an air-fluid level in the right middle lobe, most compatible with a lung abscess.  A cavitary neoplasm is significantly less likely.  Follow-up chest radiographs are recommended to assess for resolution. 2.  Right hilar adenopathy, most likely reactive.  Metastatic adenopathy  is less likely. 3.  Pneumonia throughout the majority of the right lower lobe and in the inferior right upper lobe. 4.  Linear atelectasis or scarring in the left upper lobe. 5.  COPD.     Current Medications (verified): 1)  Advair Diskus 250-50 Mcg/dose Misc (Fluticasone-Salmeterol) .... Inhale 1 Puff As Directed Twice A Day 2)  Altace 5 Mg Caps (Ramipril) .... Take 1 Capsule By Mouth Once A Day ** Hold 3)  Felodipine 5 Mg Tb24 (Felodipine) .Marland Kitchen.. 1 Once Daily  ** Hold 4)  Gabapentin 300 Mg Caps (Gabapentin) .Marland Kitchen.. 1  Tab Qam, 1 Tab @ Lunch, 2 Tabs At Bedtime 5)  Imdur 30 Mg Tb24 (Isosorbide Mononitrate) .Marland Kitchen.. 1 Once Daily 6)  Lorazepam 0.5 Mg Tabs (Lorazepam) .... Take 1 Tablet Bid 7)  Simvastatin 80 Mg  Tabs (Simvastatin) .Marland Kitchen.. 1 Once Daily 8)  Hydrocodone-Acetaminophen 5-500 Mg  Tabs (Hydrocodone-Acetaminophen) .... 1/2 Tab Six Times Daily 9)  Chlorthalidone 25 Mg  Tabs (Chlorthalidone) .... One Daily ** Hold 10)  Sertraline Hcl 50 Mg  Tabs (Sertraline Hcl) .... 1/2 Tab At Bedtime 11)  Voltaren 1 % Gel (Diclofenac Sodium) .... Use Once Daily 12)  Fexofenadine Hcl 180 Mg Tabs (Fexofenadine Hcl) .Marland Kitchen.. 1 Once Daily As Needed 13)  Cilostazol 100 Mg Tabs (Cilostazol) .Marland Kitchen.. 1 Tab At Bedtime 14)  Fish Oil 1000 Mg Caps (Omega-3 Fatty Acids) .Marland Kitchen.. 1 Cap Once Daily 15)  Aspirin 81 Mg Tbec (Aspirin) .... Take One Tablet By Mouth Daily 16)  Multivitamins   Tabs (Multiple Vitamin) .Marland Kitchen.. 1 Tab Once Daily 17)  Furosemide 40 Mg Tabs (Furosemide) .... One Daily, As Needed To Control Peripheral Edema **hold** 18)  Omeprazole 20 Mg Tbec (Omeprazole) .Marland Kitchen.. 1 Tab Once Daily 19)  Promethazine Hcl 25 Mg Tabs (Promethazine Hcl) .... One Every 6 Hours For Nausea As Needed 20)  First-Dukes Mouthwash  Susp (Diphenhyd-Hydrocort-Nystatin) .Marland Kitchen.. 1 Teaspoon Swish and Swallow 4 Times Daily As Needed  Allergies (verified): 1)  Codeine Phosphate (Codeine Phosphate)  Past History:  Past medical, surgical, family and social histories (including risk factors) reviewed, and no changes noted (except as noted below).  Past Medical History: Reviewed history from 08/18/2010 and no changes required. CORONARY ARTERY DISEASE (ICD-414.00) PERIPHERAL VASCULAR DISEASE (ICD-443.9) HYPERTENSION (ICD-401.9) HYPERLIPIDEMIA (ICD-272.4) DEPRESSIVE DISORDER (ICD-311) OSTEOARTHRITIS (ICD-715.90) COLONIC POLYPS, HX OF (ICD-V12.72) lumbar spinal stenosis  right middle lobe abscess September 2011 GERD  Past Surgical History: Reviewed history from  08/18/2010 and no changes required. L subclavian bypass  September 2005 Tubal ligation Coronary artery bypass graft July 2001 Femoral popliteal bypass R.-June 2000  status post right common iliac PTA with stent placement, September 2001 status post redo right femoral l bypass February 2002; complicated by early thrombosis, requiring thrombectomy  aorto bifemoral bypass November 2004  colonoscopy October 2006 breast  bx (2) 2010 benign Dexa 12/09 carotid duplex- 6-09 Lumbar MRI 8-10 ABI 4-10 nuclear stress testing 5- 2011  negative for ischemia  Family History: Reviewed history from 09/08/2009 and no changes required. father died age 26.  History diabetes, cerebrovascular disease mother died at  age 75, status post hip surgery of an MI 5 brothers, one sister- one brother deceased -DM, CAD, SDAT  Positive diabetes, and coronary artery disease  Social History: Reviewed history from 08/18/2010 and no changes required. Former Smoker.  1 1/2 x 40 yrs.  Quit in 8 2001 2 children Works at [employer] as Scientist, physiological   Review of Systems       The  patient complains of shortness of breath with activity and non-productive cough.  The patient denies shortness of breath at rest, productive cough, coughing up blood, chest pain, irregular heartbeats, acid heartburn, indigestion, loss of appetite, weight change, abdominal pain, difficulty swallowing, sore throat, tooth/dental problems, headaches, nasal congestion/difficulty breathing through nose, sneezing, itching, ear ache, anxiety, depression, hand/feet swelling, joint stiffness or pain, rash, change in color of mucus, and fever.    Vital Signs:  Patient profile:   70 year old female O2 Sat:      97 % on 4 L/min Temp:     97 degrees F oral Pulse rate:   105 / minute Pulse rhythm:   regular Resp:     12 per minute BP supine:   105 / 70  O2 Flow:  4 L/min  Physical Exam  Additional Exam:  Gen: Pleasant, well-nourished, in no  distress,  normal affect ENT: No lesions,  mouth clear,  oropharynx clear, no postnasal drip Neck: No JVD, no TMG, no carotid bruits Lungs: No use of accessory muscles, no dullness to percussion, scattered rhonchi in RML Cardiovascular: RRR, heart sounds normal, no murmur or gallops, no peripheral edema Abdomen: soft and NT, no HSM,  BS normal Musculoskeletal: No deformities, no cyanosis or clubbing Neuro: alert, non focal Skin: Warm, no lesions or rashes    CXR  Procedure date:  08/21/2010  Findings:      RML abscess, no PTX or effusion  Impression & Recommendations:  Problem # 1:  ABSCESS, PULMONARY (ICD-513.0) Assessment Unchanged RML lung abscess.  No endobronchial lesions seen.  Hemorrhage occured after first TBBX so pt admitted for over night observation;  plan  f/u micro and single tbbx iv zosyn today d/c in am if no further bleeding with 10days further of levaquin.   Rx sent eprescribe already to pts pharmacy rov one week in office   Medications Added to Medication List This Visit: 1)  Levaquin 750 Mg Tabs (Levofloxacin) .... One tablet by mouth daily  Complete Medication List: 1)  Advair Diskus 250-50 Mcg/dose Misc (Fluticasone-salmeterol) .... Inhale 1 puff as directed twice a day 2)  Altace 5 Mg Caps (Ramipril) .... Take 1 capsule by mouth once a day ** hold 3)  Felodipine 5 Mg Tb24 (Felodipine) .Marland Kitchen.. 1 once daily  ** hold 4)  Gabapentin 300 Mg Caps (Gabapentin) .Marland Kitchen.. 1 tab qam, 1 tab @ lunch, 2 tabs at bedtime 5)  Imdur 30 Mg Tb24 (Isosorbide mononitrate) .Marland Kitchen.. 1 once daily 6)  Lorazepam 0.5 Mg Tabs (Lorazepam) .... Take 1 tablet bid 7)  Simvastatin 80 Mg Tabs (Simvastatin) .Marland Kitchen.. 1 once daily 8)  Hydrocodone-acetaminophen 5-500 Mg Tabs (Hydrocodone-acetaminophen) .... 1/2 tab six times daily 9)  Chlorthalidone 25 Mg Tabs (Chlorthalidone) .... One daily ** hold 10)  Sertraline Hcl 50 Mg Tabs (Sertraline hcl) .... 1/2 tab at bedtime 11)  Voltaren 1 % Gel (Diclofenac  sodium) .... Use once daily 12)  Fexofenadine Hcl 180 Mg Tabs (Fexofenadine hcl) .Marland Kitchen.. 1 once daily as needed 13)  Cilostazol 100 Mg Tabs (Cilostazol) .Marland Kitchen.. 1 tab at bedtime 14)  Fish Oil 1000 Mg Caps (Omega-3 fatty acids) .Marland Kitchen.. 1 cap once daily 15)  Aspirin 81 Mg Tbec (Aspirin) .... Take one tablet by mouth daily 16)  Multivitamins Tabs (Multiple vitamin) .Marland Kitchen.. 1 tab once daily 17)  Furosemide 40 Mg Tabs (Furosemide) .... One daily, as needed to control peripheral edema **hold** 18)  Omeprazole 20 Mg Tbec (Omeprazole) .Marland Kitchen.. 1 tab once daily 19)  Promethazine Hcl 25 Mg Tabs (Promethazine hcl) .... One every 6 hours for nausea as needed 20)  First-dukes Mouthwash Susp (Diphenhyd-hydrocort-nystatin) .Marland Kitchen.. 1 teaspoon swish and swallow 4 times daily as needed 21)  Levaquin 750 Mg Tabs (Levofloxacin) .... One tablet by mouth daily Prescriptions: LEVAQUIN 750 MG  TABS (LEVOFLOXACIN) One tablet by mouth daily  #10 x 0   Entered and Authorized by:   Storm Frisk MD   Signed by:   Storm Frisk MD on 08/21/2010   Method used:   Electronically to        [facility]  [address] [phone]* (retail)       [address]       [place], [place]  [address]       Ph: [phone] or [phone]       Fax: [phone]   RxID:   [id number]

## 2010-12-31 NOTE — Progress Notes (Signed)
Summary: work in [employer], please  Phone Note Call from Patient   Caller: Patient Call For: Gordy Savers  MD Summary of Call: This pt is cough/vomiting some blood this am........she is not sure which, so she is coming to be worked in now with Dr. Kirtland Bouchard. Initial call taken by: Lynann Beaver CMA,  July 30, 2010 9:25 AM

## 2010-12-31 NOTE — Letter (Signed)
Summary: New Patient letter  [facility]  [address] [place], [place] [address]   Phone: [phone]  Fax: [phone]       09/01/2010 MRN: [id number]  [facility] [address] [place], [place]  [address]  Dear Ms. Briguglio,  Welcome to the Gastroenterology Division at [name].    You are scheduled to see Willette Cluster, NP on 09-09-10 at 10am on the 3rd floor at [facility], [address]  We ask that you try to arrive at our office 15 minutes prior to your appointment time to allow for check-in.  We would like you to complete the enclosed self-administered evaluation form prior to your visit and bring it with you on the day of your appointment.  We will review it with you.  Also, please bring a complete list of all your medications or, if you prefer, bring the medication bottles and we will list them.  Please bring your insurance card so that we may make a copy of it.  If your insurance requires a referral to see a specialist, please bring your referral form from your primary care physician.  Co-payments are due at the time of your visit and may be paid by cash, check or credit card.     Your office visit will consist of a consult with your physician (includes a physical exam), any laboratory testing he/she may order, scheduling of any necessary diagnostic testing (e.g. x-ray, ultrasound, CT-scan), and scheduling of a procedure (e.g. Endoscopy, Colonoscopy) if required.  Please allow enough time on your schedule to allow for any/all of these possibilities.    If you cannot keep your appointment, please call [phone] to cancel or reschedule prior to your appointment date.  This allows [place] the opportunity to schedule an appointment for another patient in need of care.  If you do not cancel or reschedule by 5 p.m. the business day prior to your appointment date, you will be charged a $50.00 late cancellation/no-show fee.    Thank you for choosing  [facility] for your medical needs.  We appreciate the opportunity to care for you.  Please visit [place] at our website  to learn more about our practice.                     Sincerely,                                                             The Gastroenterology Division

## 2010-12-31 NOTE — Letter (Signed)
Summary: Return to Work  [employer]  [address]   [place], [place] [address]   Phone: [phone]  Fax: [phone]    08/28/2010  TO: Leodis Sias IT MAY CONCERN  RE: ESTRELLITA LASKY [address] [id number]   The above named individual is under my medical care and may return to work on: 08/31/10    If you have any further questions or need additional information, please call.     Sincerely,   [name], MD

## 2010-12-31 NOTE — Letter (Signed)
Summary: [facility] Instructions  [facility]  [address] [place], [place] [address]   Phone: [phone]  Fax: [phone]       [name]    08/10/1941    MRN: [id number]        Procedure Day /Date:10-19-10     Arrival Time:1:00 PM      Procedure Time: 2:00 PM     Location of Procedure:                    X    [facility] (4th Floor)  PREPARATION FOR COLONOSCOPY WITH MOVIPREP   Starting 5 days prior to your procedure 10-14-10 do not eat nuts, seeds, popcorn, corn, beans, peas,  salads, or any raw vegetables.  Do not take any fiber supplements (e.g. Metamucil, Citrucel, and Benefiber).  THE DAY BEFORE YOUR PROCEDURE         DATE: 10-18-10  DAY: Sunday  1.  Drink clear liquids the entire day-NO SOLID FOOD  2.  Do not drink anything colored red or purple.  Avoid juices with pulp.  No orange juice.  3.  Drink at least 64 oz. (8 glasses) of fluid/clear liquids during the day to prevent dehydration and help the prep work efficiently.  CLEAR LIQUIDS INCLUDE: Water Jello Ice Popsicles Tea (sugar ok, no milk/cream) Powdered fruit flavored drinks Coffee (sugar ok, no milk/cream) Gatorade Juice: apple, white grape, white cranberry  Lemonade Clear bullion, consomm, broth Carbonated beverages (any kind) Strained chicken noodle soup Hard Candy                             4.  In the morning, mix first dose of MoviPrep solution:    Empty 1 Pouch A and 1 Pouch B into the disposable container    Add lukewarm drinking water to the top line of the container. Mix to dissolve    Refrigerate (mixed solution should be used within 24 hrs)  5.  Begin drinking the prep at 5:00 p.m. The MoviPrep container is divided by 4 marks.   Every 15 minutes drink the solution down to the next mark (approximately 8 oz) until the full liter is complete.   6.  Follow completed prep with 16 oz of clear liquid of your choice (Nothing red or purple).  Continue to  drink clear liquids until bedtime.  7.  Before going to bed, mix second dose of MoviPrep solution:    Empty 1 Pouch A and 1 Pouch B into the disposable container    Add lukewarm drinking water to the top line of the container. Mix to dissolve    Refrigerate  THE DAY OF YOUR PROCEDURE      DATE: 10-19-10 DAY: Monday  Beginning at 9:00 AM (5 hours before procedure):         1. Every 15 minutes, drink the solution down to the next mark (approx 8 oz) until the full liter is complete.  2. Follow completed prep with 16 oz. of clear liquid of your choice.    3. You may drink clear liquids until Noon  (2 HOURS BEFORE PROCEDURE).   MEDICATION INSTRUCTIONS  Unless otherwise instructed, you should take regular prescription medications with a small sip of water   as early as possible the morning of your procedure.       OTHER INSTRUCTIONS  You will need a responsible adult at least 70 years of age  to accompany you and drive you home.   This person must remain in the waiting room during your procedure.  Wear loose fitting clothing that is easily removed.  Leave jewelry and other valuables at home.  However, you may wish to bring a book to read or  an iPod/MP3 player to listen to music as you wait for your procedure to start.  Remove all body piercing jewelry and leave at home.  Total time from sign-in until discharge is approximately 2-3 hours.  You should go home directly after your procedure and rest.  You can resume normal activities the  day after your procedure.  The day of your procedure you should not:   Drive   Make legal decisions   Operate machinery   Drink alcohol   Return to work  You will receive specific instructions about eating, activities and medications before you leave.    The above instructions have been reviewed and explained to me by   _______________________    I fully understand and can verbalize these instructions  _____________________________ Date _________  Appended Document: Moviprep Instructions The pt asked for a reglan tab to take before the prep and Willette Cluster RNP authorized me to send 1, 10mg  tablet to the pharmacy which I did.

## 2010-12-31 NOTE — Assessment & Plan Note (Signed)
Summary: HOSP FU - ANEMIA HEM POS STOOLS   History of Present Illness Visit Type: consult  Primary GI MD: Elie Goody MD [facility] Primary Provider: [name], MD  Requesting Provider: [name], MD Chief Complaint: [facility] f/u for anemia and gi bleed. Pt states that she feels great and denies any GI complaints History of Present Illness:   Patient is a 70 year old female known to Dr. Russella Dar for history of colon polyps. Patient was hospitalized last month for right lung abscess of unclear etiology. Completed antibiotics, feels better.  We saw patient in the hospital for anemia and possible GIB.  Just prior to hospital admission patient had one episode of dark brown hematemesis. No endoscopic workup was done in the hospital as it seemed the majority of blood loss was from hemoptysis. Iron studies reveal TIBC of 275 with 12% sat and ferritin of 53. Upon discharge patient was made an appt. to follow up with [place] as it was time for her screening colonoscopy anyway.   No GI symptoms, specifically no abdominal pain, nausea, vomiting, or black stool. Was taking Naprosyn prior to hospitalization.  On daily PPI and asymptomatic for most part. Sometimes has to take an extra one in the evening.    GI Review of Systems      Denies abdominal pain, acid reflux, belching, bloating, chest pain, dysphagia with liquids, dysphagia with solids, heartburn, loss of appetite, nausea, vomiting, vomiting blood, weight loss, and  weight gain.        Denies anal fissure, black tarry stools, change in bowel habit, constipation, diarrhea, diverticulosis, fecal incontinence, heme positive stool, hemorrhoids, irritable bowel syndrome, jaundice, light color stool, liver problems, rectal bleeding, and  rectal pain.    Current Medications (verified): 1)  Advair Diskus 250-50 Mcg/dose Misc (Fluticasone-Salmeterol) .... Inhale 1 Puff As Directed Twice A Day 2)  Altace 5 Mg Caps (Ramipril) .... Take 1 Capsule By Mouth Once  A Day ** Hold 3)  Felodipine 5 Mg Tb24 (Felodipine) .Marland Kitchen.. 1 Once Daily  ** Hold 4)  Gabapentin 300 Mg Caps (Gabapentin) .Marland Kitchen.. 1 Tab Qam, 1 Tab @ Lunch, 2 Tabs At Bedtime 5)  Imdur 30 Mg Tb24 (Isosorbide Mononitrate) .Marland Kitchen.. 1 Once Daily 6)  Lorazepam 0.5 Mg Tabs (Lorazepam) .... Take 1 Tablet Two Times A Day 7)  Simvastatin 80 Mg  Tabs (Simvastatin) .Marland Kitchen.. 1 Once Daily 8)  Hydrocodone-Acetaminophen 5-500 Mg  Tabs (Hydrocodone-Acetaminophen) .... 1/2 Tab Six Times Daily 9)  Chlorthalidone 25 Mg  Tabs (Chlorthalidone) .... One Daily ** Hold 10)  Sertraline Hcl 50 Mg  Tabs (Sertraline Hcl) .... 1/2 Tab At Bedtime 11)  Voltaren 1 % Gel (Diclofenac Sodium) .... Use Once Daily 12)  Fexofenadine Hcl 180 Mg Tabs (Fexofenadine Hcl) .Marland Kitchen.. 1 Once Daily As Needed 13)  Fish Oil 1000 Mg Caps (Omega-3 Fatty Acids) .Marland Kitchen.. 1 Cap Once Daily 14)  Aspirin 81 Mg Tbec (Aspirin) .... Take One Tablet By Mouth Daily 15)  Multivitamins   Tabs (Multiple Vitamin) .Marland Kitchen.. 1 Tab Once Daily 16)  Furosemide 40 Mg Tabs (Furosemide) .... One Daily, As Needed To Control Peripheral Edema **hold** 17)  Omeprazole 20 Mg Tbec (Omeprazole) .Marland Kitchen.. 1 Tab Once Daily 18)  Promethazine Hcl 25 Mg Tabs (Promethazine Hcl) .... One Every 6 Hours For Nausea As Needed 19)  Tylenol 325 Mg Tabs (Acetaminophen) .... As Needed  Allergies (verified): 1)  Codeine Phosphate (Codeine Phosphate)  Past History:  Past Medical History: CORONARY ARTERY DISEASE (ICD-414.00) PERIPHERAL VASCULAR DISEASE (  ICD-443.9) HYPERTENSION (ICD-401.9) HYPERLIPIDEMIA (ICD-272.4) DEPRESSIVE DISORDER (ICD-311) OSTEOARTHRITIS (ICD-715.90) COLONIC POLYPS, HX OF (ICD-V12.72) lumbar spinal stenosis  right middle lobe abscess September 2011    -resolved on CXR 09/01/10    -neg FOB for endobronchial lesion RML GERD Anemia Congestive Heart Failure  Past Surgical History: Reviewed history from 08/18/2010 and no changes required. L subclavian bypass  September 2005 Tubal  ligation Coronary artery bypass graft July 2001 Femoral popliteal bypass R.-June 2000  status post right common iliac PTA with stent placement, September 2001 status post redo right femoral l bypass February 2002; complicated by early thrombosis, requiring thrombectomy  aorto bifemoral bypass November 2004  colonoscopy October 2006 breast  bx (2) 2010 benign Dexa 12/09 carotid duplex- 6-09 Lumbar MRI 8-10 ABI 4-10 nuclear stress testing 5- 2011  negative for ischemia  Family History: father died age 46.  History diabetes, cerebrovascular disease mother died at  age 45, status post hip surgery of an MI 5 brothers, one sister- one brother deceased -DM, CAD, SDAT Positive diabetes, and coronary artery disease No FH of Colon Cancer:  Social History: Former Smoker.  1 1/2 x 40 yrs.  Quit in 8 2001 2 children Works at [employer] as Scientist, physiological  Widowed Alcohol Use - no Daily Caffeine Use: 2 daily  Illicit Drug Use - no Drug Use:  no  Review of Systems       The patient complains of allergy/sinus, arthritis/joint pain, and headaches-new.  The patient denies anemia, anxiety-new, back pain, blood in urine, breast changes/lumps, change in vision, confusion, cough, coughing up blood, depression-new, fainting, fatigue, fever, hearing problems, heart murmur, heart rhythm changes, itching, menstrual pain, muscle pains/cramps, night sweats, nosebleeds, pregnancy symptoms, shortness of breath, skin rash, sleeping problems, sore throat, swelling of feet/legs, swollen lymph glands, thirst - excessive , urination - excessive , urination changes/pain, urine leakage, vision changes, and voice change.    Vital Signs:  Patient profile:   70 year old female Height:      63.5 inches Weight:      133 pounds BMI:     23.27 BSA:     1.64 Pulse rate:   88 / minute Pulse rhythm:   regular BP sitting:   138 / 74  (left arm) Cuff size:   regular  Vitals Entered By: Ok Anis CMA (September 09, 2010 10:14 AM)  Physical Exam  General:  Well developed, well nourished, no acute distress. Head:  Normocephalic and atraumatic. Eyes:  Conjunctiva pink, no icterus.  Mouth:  No oral lesions. Tongue moist.  Neck:  no obvious masses  Lungs:  Clear throughout to auscultation. Heart:  RRR. Abdomen:  Abdomen soft, nontender, nondistended. No obvious masses or hepatomegaly.Normal bowel sounds.  Msk:  Symmetrical with no gross deformities. Normal posture. Extremities:  No palmar erythema, no edema.  Neurologic:  Alert and  oriented x4;  grossly normal neurologically. Skin:  Intact without significant lesions or rashes. Psych:  Alert and cooperative. Normal mood and affect.   Impression & Recommendations:  Problem # 1:  ANEMIA (ICD-285.9) Assessment Unchanged Patient has chronic normocytic anemia with hgb ranging from 9-12gm/dl for years. She recently had an acute drop in hgb while hospitalized for hemoptysis / lung abscess. Additionally, patient reports one episode of dark brown hematemesis just prior to that hospital admission.  For further evaluation the patient will be scheduled for an EGD with biopsies ( if indicated).  The risks and benefits of the procedure, as well as alternatives were discussed  with the patient and she agrees to proceed. I have asked patient to continue to hold Naprosyn for now. Continue PPI. At last recheck on 08/28/10 patient's hgb was okay at 10.4.    Orders: Colon/Endo (Colon/Endo)  Problem # 2:  COLONIC POLYPS, HX OF (ICD-V12.72) Assessment: Comment Only Due for surveillance examination. The patient will be scheduled for a colonoscopy with biopsies/polypectomy (if indicated).  The risks and benefits of the procedure, as well as alternatives were discussed with the patient and she agrees to proceed.  Orders: Colon/Endo (Colon/Endo)  Problem # 3:  GERD (ICD-530.81) Assessment: Comment Only For the most part patient is asymptomatic on daily PPI. She  occasionally has to take a second dose in the evening.   Problem # 4:  HYPERTENSION (ICD-401.9) Assessment: Comment Only  Problem # 5:  HYPERLIPIDEMIA (ICD-272.4) Assessment: Comment Only  Problem # 6:  CORONARY ARTERY DISEASE (ICD-414.00) Assessment: Comment Only  Patient Instructions: 1)  We scheduled the Endoscopy/Colonoscopy on 10-19-10. 2)  Directions and brochure provided. 3)  [facility] Patient Information Guide given to patient. 4)  Copy sent to : [name], MD 5)                         [name], MD 6)  We sent the prescription for the colonoscopy prep to [facility] pharmacy[facility].  7)  The medication list was reviewed and reconciled.  All changed / newly prescribed medications were explained.  A complete medication list was provided to the patient / caregiver. Prescriptions: REGLAN 10 MG TABS (METOCLOPRAMIDE HCL) Take 1 tab prior to drinking the colonoscopy prep  #1 x 0   Entered by:   Lowry Ram NCMA   Authorized by:   Willette Cluster NP   Signed by:   Lowry Ram NCMA on 09/09/2010   Method used:   [facility] to        [facility]  [address] [phone]ail)       [address] [place], [place]  [address]       Ph: [phone] or [phone]       Fax: [phone]   RxID:   [id number] MOVIPREP 100 GM  SOLR (PEG-KCL-NACL-NASULF-NA ASC-C) As per prep instructions.  #1 x 0   Entered by:   Lowry Ram NCMA   Authorized by:   Willette Cluster NP   Signed by:   Lowry Ram NCMA on 09/09/2010   Method used:   [facility]        [facility]  [address] [phone][facility] (retail)       [address]       [place], [place]  [address]       Ph: [phone] or [phone]       Fax: [phone]   RxID:   [id number]

## 2010-12-31 NOTE — Progress Notes (Signed)
Summary: Nuclear Pre-Procedure  Phone Note Outgoing Call Call back at [facility] Phone [phone]   Call placed by: Stanton Kidney, EMT-P,  Apr 07, 2010 3:01 PM Action Taken: Phone Call Completed Summary of Call: Left message with information on [employer] Information Sheet (see scanned document for details).     Nuclear Med Background Indications for Stress Test: Evaluation for Ischemia, Graft Patency   History: CABG, COPD, Heart Catheterization, Myocardial Perfusion Study  History Comments: '01 Heart Cath > CABG x4 4/09 MPS: EF=69%, NL  Symptoms: DOE    Nuclear Pre-Procedure Cardiac Risk Factors: Carotid Disease, Family History - CAD, History of Smoking, Hypertension, Lipids, PVD Height (in): 62.5

## 2010-12-31 NOTE — Assessment & Plan Note (Signed)
Summary: Pulmonary OV   Copy to:  [name], MD Primary [name]/Referring [name]:  Alexandra Chiquito, MD   CC:  1 month follow up.  Breathing doing well overall.  soreness/tightness in chest when bending over x 2 days.  Denies SOB, wheezing, and cough..  History of Present Illness: 70 yo WF with RML lung abscess no other risk factors and neg FOB for endobronchial lesion 9/11.  August 28, 2010 5:20 PM This pt was admitted to hosp after FOB on 9/23.  Pt had hemoptysis after FOB.  All c/s from FOB neg. Pt also with GIB and heme pos stools.  Pt had decompensated CHF as well.  Echo stable.  Hgb fell to 6.7.  Hgb 9.7 on d/c.  Pt received two units PRBC.   Pt d/c on levaquin. Pt had trop leak with low hgb but no true AMI.  Echo was stable  Since d/c there has been no dyspnea.  There is  no chest pain,  no f/c/s.  There is no hemoptysis.  There is  no mucus.   Pt is improved. Pt denies any significant sore throat, nasal congestion or excess secretions, fever, chills, sweats, unintended weight loss, pleurtic or exertional chest pain, orthopnea PND, or leg swelling Pt denies any increase in rescue therapy over baseline, denies waking up needing it or having any early am or nocturnal exacerbations of coughing/wheezing/or dyspnea.   September 25, 2010 11:37 AM The pt is doing well with breathing.  The pt becomes  sore in the right portion of her back if bends over.  If reaches at work will feel sore in the chest area.  No fever/chills/sweats.  No hemoptysis.  Overall is better.   No other new issus.  No mucus or cough.  Note CXR today with smaller abscess cavity and no airfluid level   Preventive Screening-Counseling & Management  Alcohol-Tobacco     Smoking Status: quit > 6 months     Smoke Cessation Stage: quit     Packs/Day: 1.5     Year Started: 1960     Year Quit: 06/2000     Pack years: 61  Current Medications (verified): 1)  Advair Diskus 250-50 Mcg/dose Misc  (Fluticasone-Salmeterol) .... Inhale 1 Puff As Directed Twice A Day 2)  Altace 5 Mg Caps (Ramipril) .... Take 1 Capsule By Mouth Once A Day 3)  Felodipine 5 Mg Tb24 (Felodipine) .Marland Kitchen.. 1 Once Daily 4)  Gabapentin 300 Mg Caps (Gabapentin) .Marland Kitchen.. 1 Tab Qam, 1 Tab @ Lunch, 2 Tabs At Bedtime 5)  Imdur 30 Mg Tb24 (Isosorbide Mononitrate) .Marland Kitchen.. 1 Once Daily 6)  Lorazepam 0.5 Mg Tabs (Lorazepam) .... Take 1 Tablet Two Times A Day 7)  Simvastatin 80 Mg  Tabs (Simvastatin) .Marland Kitchen.. 1 Once Daily 8)  Hydrocodone-Acetaminophen 5-500 Mg  Tabs (Hydrocodone-Acetaminophen) .... 1/2 Tab Six Times Daily 9)  Chlorthalidone 25 Mg  Tabs (Chlorthalidone) .... One Daily 10)  Sertraline Hcl 50 Mg  Tabs (Sertraline Hcl) .... 1/2 Tab At Bedtime 11)  Voltaren 1 % Gel (Diclofenac Sodium) .... Use Once Daily 12)  Fexofenadine Hcl 180 Mg Tabs (Fexofenadine Hcl) .Marland Kitchen.. 1 Once Daily As Needed 13)  Fish Oil 1000 Mg Caps (Omega-3 Fatty Acids) .Marland Kitchen.. 1 Cap Once Daily 14)  Aspirin 81 Mg Tbec (Aspirin) .... Take One Tablet By Mouth Daily 15)  Multivitamins   Tabs (Multiple Vitamin) .Marland Kitchen.. 1 Tab Once Daily 16)  Furosemide 40 Mg Tabs (Furosemide) .... One Daily, As Needed To Control Peripheral Edema 17)  Omeprazole 20 Mg Tbec (Omeprazole) .Marland Kitchen.. 1 Tab Once Daily 18)  Promethazine Hcl 25 Mg Tabs (Promethazine Hcl) .... One Every 6 Hours For Nausea As Needed 19)  Tylenol 325 Mg Tabs (Acetaminophen) .... As Needed 20)  Moviprep 100 Gm  Solr (Peg-Kcl-Nacl-Nasulf-Na Asc-C) .... As Per Prep Instructions. 21)  Reglan 10 Mg Tabs (Metoclopramide Hcl) .... Take 1 Tab Prior To Drinking The Colonoscopy Prep  Allergies (verified): 1)  Codeine Phosphate (Codeine Phosphate)  Past History:  Past medical, surgical, family and social histories (including risk factors) reviewed, and no changes noted (except as noted below).  Past Medical History: Reviewed history from 09/09/2010 and no changes required. CORONARY ARTERY DISEASE (ICD-414.00) PERIPHERAL  VASCULAR DISEASE (ICD-443.9) HYPERTENSION (ICD-401.9) HYPERLIPIDEMIA (ICD-272.4) DEPRESSIVE DISORDER (ICD-311) OSTEOARTHRITIS (ICD-715.90) COLONIC POLYPS, HX OF (ICD-V12.72) lumbar spinal stenosis  right middle lobe abscess September 2011    -resolved on CXR 09/01/10    -neg FOB for endobronchial lesion RML GERD Anemia Congestive Heart Failure  Past Surgical History: Reviewed history from 08/18/2010 and no changes required. L subclavian bypass  September 2005 Tubal ligation Coronary artery bypass graft July 2001 Femoral popliteal bypass R.-June 2000  status post right common iliac PTA with stent placement, September 2001 status post redo right femoral l bypass February 2002; complicated by early thrombosis, requiring thrombectomy  aorto bifemoral bypass November 2004  colonoscopy October 2006 breast  bx (2) 2010 benign Dexa 12/09 carotid duplex- 6-09 Lumbar MRI 8-10 ABI 4-10 nuclear stress testing 5- 2011  negative for ischemia  Family History: Reviewed history from 09/09/2010 and no changes required. father died age 5.  History diabetes, cerebrovascular disease mother died at  age 68, status post hip surgery of an MI 5 brothers, one sister- one brother deceased -DM, CAD, SDAT Positive diabetes, and coronary artery disease No FH of Colon Cancer:  Social History: Reviewed history from 09/09/2010 and no changes required. Former Smoker.  1 1/2 x 40 yrs.  Quit in 8 2001 2 children Works at [employer] as Scientist, physiological  Widowed Alcohol Use - no Daily Caffeine Use: 2 daily  Illicit Drug Use - no  Review of Systems       The patient complains of chest pain.  The patient denies shortness of breath with activity, shortness of breath at rest, productive cough, non-productive cough, coughing up blood, irregular heartbeats, acid heartburn, indigestion, loss of appetite, weight change, abdominal pain, difficulty swallowing, sore throat, tooth/dental problems, headaches,  nasal congestion/difficulty breathing through nose, sneezing, itching, ear ache, anxiety, depression, hand/feet swelling, joint stiffness or pain, rash, change in color of mucus, and fever.    Vital Signs:  Patient profile:   70 year old female Height:      62.5 inches Weight:      134.38 pounds BMI:     24.27 O2 Sat:      95 % on Room air Temp:     97.4 degrees F oral Pulse rate:   71 / minute BP sitting:   160 / 78  (left arm) Cuff size:   regular  Vitals Entered By: Gweneth Dimitri RN (September 25, 2010 11:33 AM)  O2 Flow:  Room air  CC: 1 month follow up.  Breathing doing well overall.  soreness/tightness in chest when bending over x 2 days.  Denies SOB, wheezing, cough. Comments Medications reviewed with patient Daytime contact number verified with patient. Gweneth Dimitri RN  September 25, 2010 11:34 AM    Physical Exam  Additional Exam:  Gen: Pleasant,  well-nourished, in no distress,  normal affect ENT: No lesions,  mouth clear,  oropharynx clear, no postnasal drip Neck: No JVD, no TMG, no carotid bruits Lungs: No use of accessory muscles, no dullness to percussion, clear, no rhonchi Cardiovascular: RRR, heart sounds normal, no murmur or gallops, no peripheral edema Abdomen: soft and NT, no HSM,  BS normal Musculoskeletal: No deformities, no cyanosis or clubbing Neuro: alert, non focal Skin: Warm, no lesions or rashes    CXR  Procedure date:  09/25/2010  Findings:      IMPRESSION: Persistent abscess cavity lucency in the right middle lobe appears slightly smaller now and the cavity wall appears thinner.    Impression & Recommendations:  Problem # 1:  ABSCESS, PULMONARY (ICD-513.0) Assessment Improved Improved lung abscess.  Off all antibiotics, neg FOB for endobronchial lesion plan observation for now Orders: Spirometry w/Graph (94010) Est. Patient Level III (04540)  Problem # 2:  COPD, MILD (ICD-496) Assessment: Improved mild airflow  obstruction plan d/c advair as needed SABA  Medications Added to Medication List This Visit: 1)  Altace 5 Mg Caps (Ramipril) .... Take 1 capsule by mouth once a day 2)  Felodipine 5 Mg Tb24 (Felodipine) .Marland Kitchen.. 1 once daily 3)  Chlorthalidone 25 Mg Tabs (Chlorthalidone) .... One daily 4)  Furosemide 40 Mg Tabs (Furosemide) .... One daily, as needed to control peripheral edema 5)  Proair Hfa 108 (90 Base) Mcg/act Aers (Albuterol sulfate) .Marland Kitchen.. 1-2 puffs every 4-6 hours as needed  Complete Medication List: 1)  Altace 5 Mg Caps (Ramipril) .... Take 1 capsule by mouth once a day 2)  Felodipine 5 Mg Tb24 (Felodipine) .Marland Kitchen.. 1 once daily 3)  Gabapentin 300 Mg Caps (Gabapentin) .Marland Kitchen.. 1 tab qam, 1 tab @ lunch, 2 tabs at bedtime 4)  Imdur 30 Mg Tb24 (Isosorbide mononitrate) .Marland Kitchen.. 1 once daily 5)  Lorazepam 0.5 Mg Tabs (Lorazepam) .... Take 1 tablet two times a day 6)  Simvastatin 80 Mg Tabs (Simvastatin) .Marland Kitchen.. 1 once daily 7)  Hydrocodone-acetaminophen 5-500 Mg Tabs (Hydrocodone-acetaminophen) .... 1/2 tab six times daily 8)  Chlorthalidone 25 Mg Tabs (Chlorthalidone) .... One daily 9)  Sertraline Hcl 50 Mg Tabs (Sertraline hcl) .... 1/2 tab at bedtime 10)  Voltaren 1 % Gel (Diclofenac sodium) .... Use once daily 11)  Fexofenadine Hcl 180 Mg Tabs (Fexofenadine hcl) .Marland Kitchen.. 1 once daily as needed 12)  Fish Oil 1000 Mg Caps (Omega-3 fatty acids) .Marland Kitchen.. 1 cap once daily 13)  Aspirin 81 Mg Tbec (Aspirin) .... Take one tablet by mouth daily 14)  Multivitamins Tabs (Multiple vitamin) .Marland Kitchen.. 1 tab once daily 15)  Furosemide 40 Mg Tabs (Furosemide) .... One daily, as needed to control peripheral edema 16)  Omeprazole 20 Mg Tbec (Omeprazole) .Marland Kitchen.. 1 tab once daily 17)  Promethazine Hcl 25 Mg Tabs (Promethazine hcl) .... One every 6 hours for nausea as needed 18)  Tylenol 325 Mg Tabs (Acetaminophen) .... As needed 19)  Moviprep 100 Gm Solr (Peg-kcl-nacl-nasulf-na asc-c) .... As per prep instructions. 20)  Reglan 10 Mg  Tabs (Metoclopramide hcl) .... Take 1 tab prior to drinking the colonoscopy prep 21)  Proair Hfa 108 (90 Base) Mcg/act Aers (Albuterol sulfate) .Marland Kitchen.. 1-2 puffs every 4-6 hours as needed  Other Orders: T-2 View CXR (71020TC)  Patient Instructions: 1)  Stop Advair 2)  Start proair 1-2 puffs every 4-6 hours as needed for shortness of breath 3)  A chest xray will be obtained, I will call with results 4)  Return 2  months for recheck Prescriptions: PROAIR HFA 108 (90 BASE) MCG/ACT  AERS (ALBUTEROL SULFATE) 1-2 puffs every 4-6 hours as needed  #1 x 6   Entered and Authorized by:   Storm Frisk MD   Signed by:   Storm Frisk MD on 09/25/2010   Method used:   Electronically to        [facility]  [address] [phone] (retail)       [address] [place], [place]  [address]       Ph: [phone] or [phone]       Fax: [phone]   RxID:   [id number]

## 2010-12-31 NOTE — Assessment & Plan Note (Signed)
Summary: 4 month rov/njr/pt rsc from bmp/cjr   Vital Signs:  Patient profile:   70 year old female Weight:      150 pounds Temp:     97.9 degrees F oral BP sitting:   124 / 70  (left arm) Cuff size:   regular  Vitals Entered By: Duard Brady LPN (March 12, 2010 8:13 AM) CC: 4 mos rov - productive cough Is Patient Diabetic? No   Primary Care Provider:  Gordy Savers  MD  CC:  4 mos rov - productive cough.  History of Present Illness:  70 year old patient who is seen today for follow-up of her coronary artery disease, hypertension, dyslipidemia, peripheral vascular occlusive disease.  Her main complaint is knee pain.  She is followed by orthopedics.  She also has had a nonproductive cough and is requesting a chest x-ray.  She is a former smoker.  There's been no weight loss or chest pain.  Denies any claudication.  Allergies: 1)  Codeine Phosphate (Codeine Phosphate)  Past History:  Past Medical History: Reviewed history from 12/19/2009 and no changes required. CORONARY ARTERY DISEASE (ICD-414.00) PERIPHERAL VASCULAR DISEASE (ICD-443.9) HYPERTENSION (ICD-401.9) HYPERLIPIDEMIA (ICD-272.4) DEPRESSIVE DISORDER (ICD-311) HEALTH SCREENING (ICD-V70.0) OSTEOARTHRITIS (ICD-715.90) COLONIC POLYPS, HX OF (ICD-V12.72) lumbar spinal stenosis   GERD  Review of Systems       The patient complains of prolonged cough, difficulty walking, and depression.  The patient denies anorexia, fever, weight loss, weight gain, vision loss, decreased hearing, hoarseness, chest pain, syncope, dyspnea on exertion, peripheral edema, headaches, hemoptysis, abdominal pain, melena, hematochezia, severe indigestion/heartburn, hematuria, incontinence, genital sores, muscle weakness, suspicious skin lesions, transient blindness, unusual weight change, abnormal bleeding, enlarged lymph nodes, angioedema, and breast masses.    Physical Exam  General:  overweight-appearing.  120/78 Head:   Normocephalic and atraumatic without obvious abnormalities. No apparent alopecia or balding. Eyes:  No corneal or conjunctival inflammation noted. EOMI. Perrla. Funduscopic exam benign, without hemorrhages, exudates or papilledema. Vision grossly normal. Mouth:  Oral mucosa and oropharynx without lesions or exudates.  Teeth in good repair. Neck:  bilateral bruits Chest Wall:  status post sternotomy Lungs:  Normal respiratory effort, chest expands symmetrically. Lungs are clear to auscultation, no crackles or wheezes. Heart:  grade 2/6 systolic murmur, loudest at the primary aortic area Abdomen:  Bowel sounds positive,abdomen soft and non-tender without masses, organomegaly or hernias noted. Msk:  No deformity or scoliosis noted of thoracic or lumbar spine.   Pulses:  left dorsalis pedis pulse intact.  Other peripheral pulses not easily palpable Skin:  Intact without suspicious lesions or rashes Cervical Nodes:  No lymphadenopathy noted   Impression & Recommendations:  Problem # 1:  CORONARY ARTERY DISEASE (ICD-414.00)  Her updated medication list for this problem includes:    Altace 5 Mg Caps (Ramipril) .Marland Kitchen... Take 1 capsule by mouth once a day    Felodipine 5 Mg Tb24 (Felodipine) .Marland Kitchen... 1 once daily    Imdur 30 Mg Tb24 (Isosorbide mononitrate) .Marland Kitchen... 1 once daily    Chlorthalidone 25 Mg Tabs (Chlorthalidone) ..... One daily    Cilostazol 100 Mg Tabs (Cilostazol) .Marland Kitchen... 1 tab at bedtime    Aspirin 81 Mg Tbec (Aspirin) .Marland Kitchen... Take one tablet by mouth daily    Furosemide 40 Mg Tabs (Furosemide) ..... One daily, as needed to control peripheral edema  Problem # 2:  PERIPHERAL VASCULAR DISEASE (ICD-443.9)  Problem # 3:  HYPERTENSION (ICD-401.9)  Her updated medication list for this problem includes:    Altace  5 Mg Caps (Ramipril) .Marland Kitchen... Take 1 capsule by mouth once a day    Felodipine 5 Mg Tb24 (Felodipine) .Marland Kitchen... 1 once daily    Chlorthalidone 25 Mg Tabs (Chlorthalidone) ..... One daily     Furosemide 40 Mg Tabs (Furosemide) ..... One daily, as needed to control peripheral edema  Problem # 4:  OSTEOARTHRITIS (ICD-715.90) Assessment: New  Her updated medication list for this problem includes:    Naproxen 500 Mg Tabs (Naproxen) .Marland Kitchen... 1 two times a day    Hydrocodone-acetaminophen 5-500 Mg Tabs (Hydrocodone-acetaminophen) .Marland Kitchen... 1/2 tab six times daily    Aspirin 81 Mg Tbec (Aspirin) .Marland Kitchen... Take one tablet by mouth daily  Complete Medication List: 1)  Advair Diskus 250-50 Mcg/dose Misc (Fluticasone-salmeterol) .... Inhale 1 puff as directed twice a day 2)  Altace 5 Mg Caps (Ramipril) .... Take 1 capsule by mouth once a day 3)  Felodipine 5 Mg Tb24 (Felodipine) .Marland Kitchen.. 1 once daily 4)  Gabapentin 300 Mg Caps (Gabapentin) .... 2 at bedtime one in the morning 5)  Imdur 30 Mg Tb24 (Isosorbide mononitrate) .Marland Kitchen.. 1 once daily 6)  Lorazepam 0.5 Mg Tabs (Lorazepam) .... Take 1 tablet bid 7)  Naproxen 500 Mg Tabs (Naproxen) .Marland Kitchen.. 1 two times a day 8)  Simvastatin 80 Mg Tabs (Simvastatin) .Marland Kitchen.. 1 once daily 9)  Hydrocodone-acetaminophen 5-500 Mg Tabs (Hydrocodone-acetaminophen) .... 1/2 tab six times daily 10)  Chlorthalidone 25 Mg Tabs (Chlorthalidone) .... One daily 11)  Sertraline Hcl 50 Mg Tabs (Sertraline hcl) .... 1/2 tab at bedtime 12)  Voltaren 1 % Gel (Diclofenac sodium) .... Use once daily 13)  Fexofenadine Hcl 180 Mg Tabs (Fexofenadine hcl) .Marland Kitchen.. 1 once daily as needed 14)  Cilostazol 100 Mg Tabs (Cilostazol) .Marland Kitchen.. 1 tab at bedtime 15)  Fish Oil 1000 Mg Caps (Omega-3 fatty acids) .Marland Kitchen.. 1 cap once daily 16)  Aspirin 81 Mg Tbec (Aspirin) .... Take one tablet by mouth daily 17)  Multivitamins Tabs (Multiple vitamin) .Marland Kitchen.. 1 tab once daily 18)  Furosemide 40 Mg Tabs (Furosemide) .... One daily, as needed to control peripheral edema 19)  Px Omeprazole 20 Mg Tbec (Omeprazole) .... One twice daily  Other Orders: T-2 View CXR (71020TC)  Patient Instructions: 1)  Please schedule a follow-up  appointment in 3 months. 2)  Limit your Sodium (Salt) to less than 2 grams a day(slightly less than 1/2 a teaspoon) to prevent fluid retention, swelling, or worsening of symptoms. 3)  It is important that you exercise regularly at least 20 minutes 5 times a week. If you develop chest pain, have severe difficulty breathing, or feel very tired , stop exercising immediately and seek medical attention.

## 2010-12-31 NOTE — Procedures (Signed)
Summary: Colonoscopy  Patient: [name] Note: All result statuses are Final unless otherwise noted.  Tests: (1) Colonoscopy (COL)   COL Colonoscopy           DONE     [facility]     [address]     [place], [place]  [address]           COLONOSCOPY PROCEDURE REPORT           PATIENT:  Alexandra, Mooney  MR#:  [id number]     BIRTHDATE:  08-Apr-1941, 69 yrs. old  GENDER:  female     ENDOSCOPIST:  Judie Petit T. Russella Dar, MD, [facility]           PROCEDURE DATE:  10/19/2010     PROCEDURE:  Colonoscopy with biopsy and snare polypectomy     ASA CLASS:  Class II     INDICATIONS:  1) surveillance and high-risk screening  2) history     of adenomatous colon polyps: 2000     MEDICATIONS:   Fentanyl 75 mcg IV, Versed 8 mg IV     DESCRIPTION OF PROCEDURE:   After the risks benefits and     alternatives of the procedure were thoroughly explained, informed     consent was obtained.  Digital rectal exam was performed and     revealed no abnormalities.   The LB [id number] endoscope     was introduced through the anus and advanced to the cecum, which     was identified by both the appendix and ileocecal valve, without     limitations.  The quality of the prep was excellent, using     MoviPrep.  The instrument was then slowly withdrawn as the colon     was fully examined.     <<PROCEDUREIMAGES>>     FINDINGS:  A sessile polyp was found. It was 3 mm in size. The     polyp was removed using cold biopsy forceps. A sessile polyp was     found at the hepatic flexure. It was 6 mm in size. Polyp was     snared, then cauterized with monopolar cautery. Retrieval was     successful. A sessile polyp was found in the sigmoid colon. It was     4 mm in size. Polyp was snared without cautery. Retrieval was     successful. Three polyps were found in the sigmoid colon. They     were 3 - 4 mm in size. The polyps were removed using hot biopsy     forceps.  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 This was otherwise     a normal examination of the colon. Retroflexed views in the rectum     revealed no abnormalities. The time to cecum =  5.5  minutes. The     scope was then withdrawn (time =  11.33  min) from the patient and     the procedure completed.           COMPLICATIONS:  None           ENDOSCOPIC IMPRESSION:     1) 3 mm sessile polyp     2) 6 mm sessile polyp at the hepatic flexure     3) 4 mm sessile polyp in the sigmoid colon     4) 3 - 4 mm, three polyps in the sigmoid colon  RECOMMENDATIONS:     1) Await pathology results     2) Repeat Colonoscopy in 5 years pending pathology review.           Venita Lick. Russella Dar, MD, [facility]           n.     eSIGNED:   Venita Lick. Dalonda Simoni at 10/19/2010 03:06 PM           [place], [place], [phone]  Note: An exclamation mark (!) indicates a result that was not dispersed into the flowsheet. Document Creation Date: 10/19/2010 3:06 PM _______________________________________________________________________  (1) Order result status: Final Collection or observation date-time: 10/19/2010 14:46 Requested date-time:  Receipt date-time:  Reported date-time:  Referring Physician:   Ordering Physician: Claudette Head [id number] Specimen Source:  Source: Launa Grill Order Number: [id number] Lab site:   Appended Document: Colonoscopy     Procedures Next Due Date:    Colonoscopy: 09/2015

## 2010-12-31 NOTE — Assessment & Plan Note (Signed)
Summary: PERSISTENT COUGH / BLOOD IN MUCUS / NAUSEA // RS   Vital Signs:  Patient profile:   70 year old female Weight:      139 pounds Temp:     98.0 degrees F oral BP sitting:   138 / 80  (left arm) Cuff size:   regular  Vitals Entered By: Duard Brady LPN (July 30, 2010 10:48 AM) CC: c/o vomiting lg amt blood this AM with coughing , c/o headache Is Patient Diabetic? No   Primary Care Provider:  Gordy Savers  MD  CC:  c/o vomiting lg amt blood this AM with coughing  and c/o headache.  History of Present Illness: 70 year old patient who is seen today for follow-up.  She was seen earlier and treated for acute bronchitis 8 days ago with Levaquin.  She initially improved, but over the past few days has had a relapse with weakness, headache, and general sense of unwellness.  Yesterday, she developed more cough and had some small-volume hemoptysis.  Several time yesterday morning.  this did not recur throughout the day.  Today, she felt quite weak and became nauseated and had an episode of emesis.  She again noted  Small-volume hemoptysis.  She has a history of prior tobacco use, but quit a number of years ago.  She has coronary artery disease, dyslipidemia, and hypertension  Allergies: 1)  Codeine Phosphate (Codeine Phosphate)  Past History:  Past Medical History: Reviewed history from 12/19/2009 and no changes required. CORONARY ARTERY DISEASE (ICD-414.00) PERIPHERAL VASCULAR DISEASE (ICD-443.9) HYPERTENSION (ICD-401.9) HYPERLIPIDEMIA (ICD-272.4) DEPRESSIVE DISORDER (ICD-311) HEALTH SCREENING (ICD-V70.0) OSTEOARTHRITIS (ICD-715.90) COLONIC POLYPS, HX OF (ICD-V12.72) lumbar spinal stenosis   GERD  Past Surgical History: Reviewed history from 06/11/2010 and no changes required. L subclavian bypass  September 2005 Tubal ligation Coronary artery bypass graft July 2001 Femoral popliteal bypass R.-June 2000  status post right common iliac PTA with stent  placement, September 2001 status post redo right femoral until bypass February 2002; complicated by early thrombosis, requiring thrombectomy  aorto bifemoral bypass November 2004  colonoscopy October 2006 breat bx (2) 2010 Dexa 12/09 carotid duplex- 6-09 Lumbar MRI 8-10 ABI 4-10 nuclear stress testing 5- 2011  Review of Systems       The patient complains of anorexia, hoarseness, prolonged cough, hemoptysis, and muscle weakness.  The patient denies fever, weight loss, weight gain, vision loss, decreased hearing, chest pain, syncope, dyspnea on exertion, peripheral edema, headaches, abdominal pain, melena, hematochezia, severe indigestion/heartburn, hematuria, incontinence, genital sores, suspicious skin lesions, transient blindness, difficulty walking, depression, unusual weight change, abnormal bleeding, enlarged lymph nodes, angioedema, and breast masses.    Physical Exam  General:  Well-developed,well-nourished,in no acute distress; alert,appropriate and cooperative throughout examination Head:  Normocephalic and atraumatic without obvious abnormalities. No apparent alopecia or balding. Eyes:  No corneal or conjunctival inflammation noted. EOMI. Perrla. Funduscopic exam benign, without hemorrhages, exudates or papilledema. Vision grossly normal. Mouth:  Oral mucosa and oropharynx without lesions or exudates.  Teeth in good repair. Neck:  No deformities, masses, or tenderness noted. Lungs:  a few rhonchi noted involving the right base Heart:  Normal rate and regular rhythm. S1 and S2 normal without gallop, murmur, click, rub or other extra sounds. Abdomen:  Bowel sounds positive,abdomen soft and non-tender without masses, organomegaly or hernias noted. Msk:  No deformity or scoliosis noted of thoracic or lumbar spine.   Extremities:  No clubbing, cyanosis, edema, or deformity noted with normal full range of motion of all joints.  Impression & Recommendations:  Problem # 1:   ACUTE BRONCHITIS (ICD-466.0)  Her updated medication list for this problem includes:    Advair Diskus 250-50 Mcg/dose Misc (Fluticasone-salmeterol) ..... Inhale 1 puff as directed twice a day    Tessalon 200 Mg Caps (Benzonatate) ..... One tab every 8 hours for cough    Hydromet 5-1.5 Mg/47ml Syrp (Hydrocodone-homatropine) .Marland Kitchen... 2 tsp q4- h as needed cough as needed cough    Levaquin 750 Mg Tabs (Levofloxacin) .Marland Kitchen... 1 once daily for lung infection    Azithromycin 250 Mg Tabs (Azithromycin) .Marland Kitchen..Marland Kitchen Two initially, then one daily for 4 additional days    Her updated medication list for this problem includes:    Advair Diskus 250-50 Mcg/dose Misc (Fluticasone-salmeterol) ..... Inhale 1 puff as directed twice a day    Tessalon 200 Mg Caps (Benzonatate) ..... One tab every 8 hours for cough    Hydromet 5-1.5 Mg/70ml Syrp (Hydrocodone-homatropine) .Marland Kitchen... 2 tsp q4- h as needed cough as needed cough    Levaquin 750 Mg Tabs (Levofloxacin) .Marland Kitchen... 1 once daily for lung infection    Azithromycin 250 Mg Tabs (Azithromycin) .Marland Kitchen..Marland Kitchen Two initially, then one daily for 4 additional days  Orders: Venipuncture (16109) TLB-CBC Platelet - w/Differential (85025-CBCD) TLB-BMP (Basic Metabolic Panel-BMET) (80048-METABOL) T-2 View CXR (71020TC) Specimen Handling (60454)  Problem # 2:  HEMOPTYSIS UNSPECIFIED (ICD-786.30)  Orders: Venipuncture (09811) TLB-CBC Platelet - w/Differential (85025-CBCD) TLB-BMP (Basic Metabolic Panel-BMET) (80048-METABOL) T-2 View CXR (71020TC) Specimen Handling (91478)  Problem # 3:  TOBACCO USE, QUIT (ICD-V15.82)  Orders: T-2 View CXR (71020TC)  Problem # 4:  COUGH (ICD-786.2)  Complete Medication List: 1)  Advair Diskus 250-50 Mcg/dose Misc (Fluticasone-salmeterol) .... Inhale 1 puff as directed twice a day 2)  Altace 5 Mg Caps (Ramipril) .... Take 1 capsule by mouth once a day 3)  Felodipine 5 Mg Tb24 (Felodipine) .Marland Kitchen.. 1 once daily 4)  Gabapentin 300 Mg Caps (Gabapentin) .Marland Kitchen..  1 tab qam, 1 tab @ lunch, 2 tabs at bedtime 5)  Imdur 30 Mg Tb24 (Isosorbide mononitrate) .Marland Kitchen.. 1 once daily 6)  Lorazepam 0.5 Mg Tabs (Lorazepam) .... Take 1 tablet bid 7)  Naproxen 500 Mg Tabs (Naproxen) .Marland Kitchen.. 1 two times a day 8)  Simvastatin 80 Mg Tabs (Simvastatin) .Marland Kitchen.. 1 once daily 9)  Hydrocodone-acetaminophen 5-500 Mg Tabs (Hydrocodone-acetaminophen) .... 1/2 tab six times daily 10)  Chlorthalidone 25 Mg Tabs (Chlorthalidone) .... One daily 11)  Sertraline Hcl 50 Mg Tabs (Sertraline hcl) .... 1/2 tab at bedtime 12)  Voltaren 1 % Gel (Diclofenac sodium) .... Use once daily 13)  Fexofenadine Hcl 180 Mg Tabs (Fexofenadine hcl) .Marland Kitchen.. 1 once daily as needed 14)  Cilostazol 100 Mg Tabs (Cilostazol) .Marland Kitchen.. 1 tab at bedtime 15)  Fish Oil 1000 Mg Caps (Omega-3 fatty acids) .Marland Kitchen.. 1 cap once daily 16)  Aspirin 81 Mg Tbec (Aspirin) .... Take one tablet by mouth daily 17)  Multivitamins Tabs (Multiple vitamin) .Marland Kitchen.. 1 tab once daily 18)  Furosemide 40 Mg Tabs (Furosemide) .... One daily, as needed to control peripheral edema 19)  Omeprazole 20 Mg Tbec (Omeprazole) .Marland Kitchen.. 1 tab once daily 20)  Tessalon 200 Mg Caps (Benzonatate) .... One tab every 8 hours for cough 21)  Hydromet 5-1.5 Mg/26ml Syrp (Hydrocodone-homatropine) .... 2 tsp q4- h as needed cough as needed cough 22)  Levaquin 750 Mg Tabs (Levofloxacin) .Marland Kitchen.. 1 once daily for lung infection 23)  Azithromycin 250 Mg Tabs (Azithromycin) .... Two initially, then one daily for 4 additional days  Patient Instructions: 1)  Please schedule a follow-up appointment as needed. 2)  Mucinex use twice daily  3)  Take your antibiotic as prescribed until ALL of it is gone, but stop if you develop a rash or swelling and contact our office as soon as possible. Prescriptions: AZITHROMYCIN 250 MG TABS (AZITHROMYCIN) two initially, then one daily for 4 additional days  #6 x 0   Entered and Authorized by:   Gordy Savers  MD   Signed by:   Gordy Savers  MD  on 07/30/2010   Method used:   Electronically to        [facility] (retail)       [address] [place], [place]  [address]       Ph: [phone]       Fax: [phone]   RxID:   [id number]   Appended Document: PERSISTENT COUGH / BLOOD IN MUCUS / NAUSEA // RS     Allergies: 1)  Codeine Phosphate (Codeine Phosphate)   Complete Medication List: 1)  Advair Diskus 250-50 Mcg/dose Misc (Fluticasone-salmeterol) .... Inhale 1 puff as directed twice a day 2)  Altace 5 Mg Caps (Ramipril) .... Take 1 capsule by mouth once a day 3)  Felodipine 5 Mg Tb24 (Felodipine) .Marland Kitchen.. 1 once daily 4)  Gabapentin 300 Mg Caps (Gabapentin) .Marland Kitchen.. 1 tab qam, 1 tab @ lunch, 2 tabs at bedtime 5)  Imdur 30 Mg Tb24 (Isosorbide mononitrate) .Marland Kitchen.. 1 once daily 6)  Lorazepam 0.5 Mg Tabs (Lorazepam) .... Take 1 tablet bid 7)  Naproxen 500 Mg Tabs (Naproxen) .Marland Kitchen.. 1 two times a day 8)  Simvastatin 80 Mg Tabs (Simvastatin) .Marland Kitchen.. 1 once daily 9)  Hydrocodone-acetaminophen 5-500 Mg Tabs (Hydrocodone-acetaminophen) .... 1/2 tab six times daily 10)  Chlorthalidone 25 Mg Tabs (Chlorthalidone) .... One daily 11)  Sertraline Hcl 50 Mg Tabs (Sertraline hcl) .... 1/2 tab at bedtime 12)  Voltaren 1 % Gel (Diclofenac sodium) .... Use once daily 13)  Fexofenadine Hcl 180 Mg Tabs (Fexofenadine hcl) .Marland Kitchen.. 1 once daily as needed 14)  Cilostazol 100 Mg Tabs (Cilostazol) .Marland Kitchen.. 1 tab at bedtime 15)  Fish Oil 1000 Mg Caps (Omega-3 fatty acids) .Marland Kitchen.. 1 cap once daily 16)  Aspirin 81 Mg Tbec (Aspirin) .... Take one tablet by mouth daily 17)  Multivitamins Tabs (Multiple vitamin) .Marland Kitchen.. 1 tab once daily 18)  Furosemide 40 Mg Tabs (Furosemide) .... One daily, as needed to control peripheral edema 19)  Omeprazole 20 Mg Tbec (Omeprazole) .Marland Kitchen.. 1 tab once daily 20)  Tessalon 200 Mg Caps (Benzonatate) .... One tab every 8 hours for cough 21)  Hydromet 5-1.5 Mg/60ml Syrp (Hydrocodone-homatropine) .... 2  tsp q4- h as needed cough as needed cough 22)  Levaquin 750 Mg Tabs (Levofloxacin) .Marland Kitchen.. 1 once daily for lung infection 23)  Azithromycin 250 Mg Tabs (Azithromycin) .... Two initially, then one daily for 4 additional days Prescriptions: AZITHROMYCIN 250 MG TABS (AZITHROMYCIN) two initially, then one daily for 4 additional days  #6 x 0   Entered by:   Duard Brady LPN   Authorized by:   Gordy Savers  MD   Signed by:   Duard Brady LPN on 25/95/6387   Method used:   Historical   RxID:   [id number]  rx was sent by mistake to [facility] - called to [facility]. KIK

## 2010-12-31 NOTE — Letter (Signed)
Summary: Patient Notice- Polyp Results  [facility]  [address] [place], [place] [address]   Phone: [phone]  Fax: [phone]        October 26, 2010 MRN: [id number]    [facility] [address] [place], [place]  [address]    Dear Ms. Anglemyer,  I am pleased to inform you that the colon polyp(s) removed during your recent colonoscopy was (were) found to be benign (no cancer detected) upon pathologic examination.  I recommend you have a repeat colonoscopy examination in 5 years to look for recurrent polyps, as having colon polyps increases your risk for having recurrent polyps or even colon cancer in the future.  Should you develop new or worsening symptoms of abdominal pain, bowel habit changes or bleeding from the rectum or bowels, please schedule an evaluation with either your primary care physician or with me.  Continue treatment plan as outlined the day of your exam.  Please call us if you are having persistent problems or have questions about your condition that have not been fully answered at this time.  Sincerely,  Meryl Dare MD [facility]  This letter has been electronically signed by your physician.  Appended Document: Patient Notice- Polyp Results Letter mailed

## 2010-12-31 NOTE — Progress Notes (Signed)
Summary: Medication  Phone Note Call from Patient   Caller: Patient Call For: Mike Gip Reason for Call: Talk to Nurse Summary of Call: Moviprep is too expensive...needs an alternative Initial call taken by: Karna Christmas,  September 15, 2010 12:35 PM  Follow-up for Phone Call        Called home number and lm for pt to call me back. Follow-up by: Joselyn Glassman,  September 15, 2010 1:29 PM  Additional Follow-up for Phone Call Additional follow up Details #1::        I called the pt and advised her that Dr Russella Dar wants the pt to use the Moviprep. The reason is we need the colon to be purged and clean so that he can visually see all he needs to look at.  I did offer the pt a $20 rebate coupon.  She thanked me and said she will come here to pick up the coupon.   Additional Follow-up by: Joselyn Glassman,  September 15, 2010 1:40 PM

## 2010-12-31 NOTE — Progress Notes (Signed)
Summary: med , rov , cxr orders  Phone Note Outgoing Call   Call placed by: Duard Brady LPN,  July 31, 2010 9:44 AM Call placed to: Patient Action Taken: Phone Call Completed Reason for Call: Discuss lab or test results Summary of Call: called pt per Dr. Amador Cunas - will treat as outpt at this time. start levaquin 750mg  once daily #14 no RF , cxr in 2 wks and rov . appt made 08/13/10. cxr at 8am before office visit. Med sent to [facility].  Discussed if fever, sob or increase in blood/sputum to ER. Verbilized understanding.  Initial call taken by: Duard Brady LPN,  July 31, 2010 9:47 AM  New Problems: ABSCESS OF LUNG (ICD-513.0)   New Problems: ABSCESS OF LUNG (ICD-513.0) New/Updated Medications: LEVAQUIN 750 MG TABS (LEVOFLOXACIN) 1 by mouth once daily for 14 days Prescriptions: LEVAQUIN 750 MG TABS (LEVOFLOXACIN) 1 by mouth once daily for 14 days  #14 x 0   Entered by:   Duard Brady LPN   Authorized by:   Gordy Savers  MD   Signed by:   Duard Brady LPN on 27/25/3664   Method used:   [facility]        [facility] [phone][facility] ([facility]ail[facility]       [address] [place], [place]  [address]       Ph: [phone] or [phone]       Fax: [phone]   RxID:   [id number]

## 2010-12-31 NOTE — Progress Notes (Signed)
Summary: cough rx  Phone Note Call from Patient Call back at Work Phone [phone]   Caller: Patient Call For: Alexandra Savers  MD Reason for Call: Talk to Doctor Summary of Call: patient is calling because she is having a non-productive cough with a little drainage and would like to know if something can be called in. Initial call taken by: Kern Reap CMA Duncan Dull),  June 18, 2010 2:52 PM  Follow-up for Phone Call        generic hydromet 6 oz one tsp every  6 hrs for cough Follow-up by: Alexandra Savers  MD,  June 18, 2010 5:26 PM  Additional Follow-up for Phone Call Additional follow up Details #1::        Rx Called In. patient is aware Additional Follow-up by: Kern Reap CMA Duncan Dull),  June 18, 2010 5:39 PM    New/Updated Medications: TESSALON 200 MG CAPS (BENZONATATE) one tab every 8 hours for cough Prescriptions: TESSALON 200 MG CAPS (BENZONATATE) one tab every 8 hours for cough  #30 x 0   Entered by:   Sid Falcon LPN   Authorized by:   Alexandra Savers  MD   Signed by:   Sid Falcon LPN on 09/81/1914   Method used:   [facility]        [facility]  [address] [facility] [phone][facility] ([facility]ail[facility]       [address] [place], [place]  [address]       Ph: [phone] or [phone]       Fax: [phone]   RxID:   [id number]

## 2010-12-31 NOTE — Progress Notes (Signed)
Summary: refill hydrocodone- acetaminophin  Phone Note Refill Request Message from:  Fax from Pharmacy on May 11, 2010 11:24 AM  Refills Requested: Medication #1:  HYDROCODONE-ACETAMINOPHEN 5-500 MG  TABS 1/2 tab six times daily   [phone]   Method Requested: Telephone to Pharmacy Initial call taken by: Duard Brady LPN,  May 11, 2010 11:25 AM  Follow-up for Phone Call        #90  RF 4 Follow-up by: Gordy Savers  MD,  May 11, 2010 12:46 PM    Prescriptions: HYDROCODONE-ACETAMINOPHEN 5-500 MG  TABS (HYDROCODONE-ACETAMINOPHEN) 1/2 tab six times daily  #90 x 4   Entered by:   Duard Brady LPN   Authorized by:   Gordy Savers  MD   Signed by:   Duard Brady LPN on 45/40/9811   Method used:   Historical   RxID:   [id number]  [facility]   KIK

## 2010-12-31 NOTE — Progress Notes (Signed)
Summary: allergy med  Phone Note Refill Request Call back at Work Phone [phone] Message from:  Patient  Refills Requested: Medication #1:  FEXOFENADINE HCL 180 MG TABS 1 once daily as needed pt needs new rx call [facility]   Initial call taken by: Heron Sabins,  December 03, 2010 9:53 AM  Follow-up for Phone Call        #90  RF 4 Follow-up by: Gordy Savers  MD,  December 03, 2010 12:38 PM    Prescriptions: FEXOFENADINE HCL 180 MG TABS (FEXOFENADINE HCL) 1 once daily as needed  #90 x 4   Entered by:   Duard Brady LPN   Authorized by:   Gordy Savers  MD   Signed by:   Duard Brady LPN on 21/30/8657   Method used:   Faxed to ...       [facility] (retail)       [phone]       [place], [place]  [address]       Ph: [phone]       Fax: [phone]   RxID:   [id number]

## 2010-12-31 NOTE — Assessment & Plan Note (Signed)
Summary: Pulmonary Consultation   Copy to:  [name] MD Primary Provider/Referring Provider:  Gordy Savers  MD  CC:  Pulmonary Consult.Marland Kitchen  History of Present Illness: Pulmonary Consultation       This is a 70 year old female who presents for consultation at the request of [name] MD for initial Pulmonary consult.  The patient presents with fever, fatigue, failure to thrive, weight loss, night sweats, chest pain, cough, shortness of breath, shortness of breath with exertion, and hemoptysis, but has no history of nosebleeds, easy bruising, heartburn, nausea/vomiting, unable to take oral fluids/nourishment, joint swelling, leg swelling, peripheral edema, and URI symptoms.  The patient comes in today for evaluation of coughing up blood, coughing up sputum, shortness of breath, shortness of breath with exertion, chest congestion, fever, and fatigue.  Today's visit is for evaluation and management.  The patient's current symptoms are improving and do not impair daily activities.  Prior evaluation and treatment (see reports for full details) has included CXR and CT scan of chest.  The patient notes good compliance with treatment plan, a good response to treatment, and without side effects.  The patient has questions or concerns today regarding diagnosis and prognosis.    This pt is referred for abn CT scan.  Pt had low grade fever and cough since 6/11.  Mucus early on was yellow to green.  There was low grade fever but no chills or sweats.  Pt then coughed up blood after Labor Day.  BRB that went on for one week.  The blood is mixed with mucus.  Now with prolonged course of levaquin the pt is better.  There is less cough and dyspnea.    The cxr and CT chest showed RML abscess.  This is getting smaller on the film.     Preventive Screening-Counseling & Management  Alcohol-Tobacco     Smoking Status: quit > 6 months     Year Quit: 06/2000     Pack years: 94  Current  Medications (verified): 1)  Advair Diskus 250-50 Mcg/dose Misc (Fluticasone-Salmeterol) .... Inhale 1 Puff As Directed Twice A Day 2)  Altace 5 Mg Caps (Ramipril) .... Take 1 Capsule By Mouth Once A Day ** Hold 3)  Felodipine 5 Mg Tb24 (Felodipine) .Marland Kitchen.. 1 Once Daily  ** Hold 4)  Gabapentin 300 Mg Caps (Gabapentin) .Marland Kitchen.. 1 Tab Qam, 1 Tab @ Lunch, 2 Tabs At Bedtime 5)  Imdur 30 Mg Tb24 (Isosorbide Mononitrate) .Marland Kitchen.. 1 Once Daily 6)  Lorazepam 0.5 Mg Tabs (Lorazepam) .... Take 1 Tablet Bid 7)  Simvastatin 80 Mg  Tabs (Simvastatin) .Marland Kitchen.. 1 Once Daily 8)  Hydrocodone-Acetaminophen 5-500 Mg  Tabs (Hydrocodone-Acetaminophen) .... 1/2 Tab Six Times Daily 9)  Chlorthalidone 25 Mg  Tabs (Chlorthalidone) .... One Daily ** Hold 10)  Sertraline Hcl 50 Mg  Tabs (Sertraline Hcl) .... 1/2 Tab At Bedtime 11)  Voltaren 1 % Gel (Diclofenac Sodium) .... Use Once Daily 12)  Fexofenadine Hcl 180 Mg Tabs (Fexofenadine Hcl) .Marland Kitchen.. 1 Once Daily As Needed 13)  Cilostazol 100 Mg Tabs (Cilostazol) .Marland Kitchen.. 1 Tab At Bedtime 14)  Fish Oil 1000 Mg Caps (Omega-3 Fatty Acids) .Marland Kitchen.. 1 Cap Once Daily 15)  Aspirin 81 Mg Tbec (Aspirin) .... Take One Tablet By Mouth Daily 16)  Multivitamins   Tabs (Multiple Vitamin) .Marland Kitchen.. 1 Tab Once Daily 17)  Furosemide 40 Mg Tabs (Furosemide) .... One Daily, As Needed To Control Peripheral Edema **hold** 18)  Omeprazole 20 Mg Tbec (Omeprazole) .Marland KitchenMarland KitchenMarland Kitchen  1 Tab Once Daily 19)  Levaquin 750 Mg Tabs (Levofloxacin) .Marland Kitchen.. 1 Once Daily For Lung Infection 20)  Promethazine Hcl 25 Mg Tabs (Promethazine Hcl) .... One Every 6 Hours For Nausea As Needed 21)  First-Dukes Mouthwash  Susp (Diphenhyd-Hydrocort-Nystatin) .Marland Kitchen.. 1 Teaspoon Swish and Swallow 4 Times Daily As Needed  Allergies (verified): 1)  Codeine Phosphate (Codeine Phosphate)  Past History:  Past medical, surgical, family and social histories (including risk factors) reviewed, and no changes noted (except as noted below).  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 GERD  Past Surgical History: L subclavian bypass  September 2005 Tubal ligation Coronary artery bypass graft July 2001 Femoral popliteal bypass R.-June 2000  status post right common iliac PTA with stent placement, September 2001 status post redo right femoral l bypass February 2002; complicated by early thrombosis, requiring thrombectomy  aorto bifemoral bypass November 2004  colonoscopy October 2006 breast  bx (2) 2010 benign Dexa 12/09 carotid duplex- 6-09 Lumbar MRI 8-10 ABI 4-10 nuclear stress testing 5- 2011  negative for ischemia  Family History: Reviewed history from 09/08/2009 and no changes required. father died age 70.  History diabetes, cerebrovascular disease mother died at  age 59, status post hip surgery of an MI 5 brothers, one sister- one brother deceased -DM, CAD, SDAT  Positive diabetes, and coronary artery disease  Social History: Reviewed history from 02/12/2008 and no changes required. Former Smoker.  1 1/2 x 40 yrs.  Quit in 8 2001 2 children Works at [employer] as Scientist, physiological  Smoking Status:  quit > 6 months Pack years:  60  Review of Systems       The patient complains of shortness of breath with activity, productive cough, non-productive cough, coughing up blood, indigestion, loss of appetite, weight change, abdominal pain, difficulty swallowing, depression, joint stiffness or pain, and change in color of mucus.  The patient denies shortness of breath at rest, chest pain, irregular heartbeats, acid heartburn, sore throat, tooth/dental problems, headaches, nasal congestion/difficulty breathing through nose, sneezing, itching, ear ache, anxiety, hand/feet swelling,  rash, and fever.         weight loss 20# over 3 months, nausea, food sticks in esophagus,    Vital Signs:  Patient profile:   70 year old female Height:      63.5 inches Weight:      136.50 pounds BMI:     23.89 O2 Sat:      92 % on Room air Temp:     97.7 degrees F oral Pulse rate:   71 / minute BP sitting:   118 / 70  (left arm) Cuff size:   regular  Vitals Entered By: Gweneth Dimitri RN (August 18, 2010 2:13 PM)  O2 Flow:  Room air CC: Pulmonary Consult. Comments Medications reviewed with patient Daytime contact number verified with patient. Gweneth Dimitri RN  August 18, 2010 2:13 PM    Physical Exam  Additional Exam:  Gen: Pleasant, well-nourished, in no distress,  normal affect ENT: No lesions,  mouth clear,  oropharynx clear, no postnasal drip Neck: No JVD, no TMG, no carotid bruits Lungs: No use of accessory muscles, no dullness to percussion, no rhonchi or rales noted in RML Cardiovascular: RRR, heart sounds normal, no murmur or gallops, no peripheral edema Abdomen: soft and NT, no HSM,  BS  normal Musculoskeletal: No deformities, no cyanosis or clubbing Neuro: alert, non focal Skin: Warm, no lesions or rashes    CXR  Procedure date:  08/13/2010  Findings:      IMPRESSION: Interval reduction in size of the right middle lobe abscess since the examinations 2 weeks ago, though a cavitary opacity persists. Improved pneumonia in the right lower lobe.  No new pulmonary parenchymal abnormalities.  CT of Chest  Procedure date:  07/30/2010  Findings:      Findings: Mild to moderate patchy opacity throughout the majority of the right lower lobe.  More confluent, dense opacity in the right middle lobe, containing an air-fluid level.  This area measures 6.6 x 4.0 cm in maximum dimensions on image number 32. Small amount of patchy and linear density in the inferior right upper lobe.  Linear opacity in the left upper lobe.  Bullous changes throughout both  lungs.  Enlarged right hilar lymph node, measuring 2.2 x 1.5 cm in maximum dimensions on image number 31. Unremarkable upper abdomen.  Mild thoracic spine degenerative changes.   IMPRESSION:   1.  6.6 cm confluent opacity with an air-fluid level in the right middle lobe, most compatible with a lung abscess.  A cavitary neoplasm is significantly less likely.  Follow-up chest radiographs are recommended to assess for resolution. 2.  Right hilar adenopathy, most likely reactive.  Metastatic adenopathy is less likely. 3.  Pneumonia throughout the majority of the right lower lobe and in the inferior right upper lobe. 4.  Linear atelectasis or scarring in the left upper lobe. 5.  COPD.    Impression & Recommendations:  Problem # 1:  ABSCESS, PULMONARY (ICD-513.0) Assessment Improved RML abscess,  Good dentition. Hx of smoking ,  need to r/o endobronchial lesion.   Abscess is getting smaller on CT /cxr. plan finish Levaquin FOB, once results of this is available , further recs will follow.  Orders: New Patient Level V (47829)  Medications Added to Medication List This Visit: 1)  Altace 5 Mg Caps (Ramipril) .... Take 1 capsule by mouth once a day ** hold 2)  Felodipine 5 Mg Tb24 (Felodipine) .Marland Kitchen.. 1 once daily  ** hold 3)  Chlorthalidone 25 Mg Tabs (Chlorthalidone) .... One daily ** hold 4)  Furosemide 40 Mg Tabs (Furosemide) .... One daily, as needed to control peripheral edema **hold** 5)  Levaquin 750 Mg Tabs (Levofloxacin) .Marland Kitchen.. 1 once daily for lung infection 6)  Promethazine Hcl 25 Mg Tabs (Promethazine hcl) .... One every 6 hours for nausea as needed 7)  First-dukes Mouthwash Susp (Diphenhyd-hydrocort-nystatin) .Marland Kitchen.. 1 teaspoon swish and swallow 4 times daily as needed  Complete Medication List: 1)  Advair Diskus 250-50 Mcg/dose Misc (Fluticasone-salmeterol) .... Inhale 1 puff as directed twice a day 2)  Altace 5 Mg Caps (Ramipril) .... Take 1 capsule by mouth once a day **  hold 3)  Felodipine 5 Mg Tb24 (Felodipine) .Marland Kitchen.. 1 once daily  ** hold 4)  Gabapentin 300 Mg Caps (Gabapentin) .Marland Kitchen.. 1 tab qam, 1 tab @ lunch, 2 tabs at bedtime 5)  Imdur 30 Mg Tb24 (Isosorbide mononitrate) .Marland Kitchen.. 1 once daily 6)  Lorazepam 0.5 Mg Tabs (Lorazepam) .... Take 1 tablet bid 7)  Simvastatin 80 Mg Tabs (Simvastatin) .Marland Kitchen.. 1 once daily 8)  Hydrocodone-acetaminophen 5-500 Mg Tabs (Hydrocodone-acetaminophen) .... 1/2 tab six times daily 9)  Chlorthalidone 25 Mg Tabs (Chlorthalidone) .... One daily ** hold 10)  Sertraline Hcl 50 Mg Tabs (Sertraline hcl) .... 1/2 tab at bedtime  11)  Voltaren 1 % Gel (Diclofenac sodium) .... Use once daily 12)  Fexofenadine Hcl 180 Mg Tabs (Fexofenadine hcl) .Marland Kitchen.. 1 once daily as needed 13)  Cilostazol 100 Mg Tabs (Cilostazol) .Marland Kitchen.. 1 tab at bedtime 14)  Fish Oil 1000 Mg Caps (Omega-3 fatty acids) .Marland Kitchen.. 1 cap once daily 15)  Aspirin 81 Mg Tbec (Aspirin) .... Take one tablet by mouth daily 16)  Multivitamins Tabs (Multiple vitamin) .Marland Kitchen.. 1 tab once daily 17)  Furosemide 40 Mg Tabs (Furosemide) .... One daily, as needed to control peripheral edema **hold** 18)  Omeprazole 20 Mg Tbec (Omeprazole) .Marland Kitchen.. 1 tab once daily 19)  Levaquin 750 Mg Tabs (Levofloxacin) .Marland Kitchen.. 1 once daily for lung infection 20)  Promethazine Hcl 25 Mg Tabs (Promethazine hcl) .... One every 6 hours for nausea as needed 21)  First-dukes Mouthwash Susp (Diphenhyd-hydrocort-nystatin) .Marland Kitchen.. 1 teaspoon swish and swallow 4 times daily as needed  Patient Instructions: 1)  A bronchoscopy will be scheduled at 830AM on 08/21/10 Friday. Nothing by mouth after midnight the night before 2)  No change in medications. 3)  Return in one week

## 2010-12-31 NOTE — Progress Notes (Signed)
Summary: Pt req refills on all medications. Pt will pick up scripts  Phone Note Call from Patient Call back at Work Phone [phone]   Caller: Patient Summary of Call: Pt called and said that she didnt get refills for any of her meds, when she came in for ov today. Pt req refill of all medications.  Pt is completely out Naproxen.   Pt will pick up written scripts. Pls call when ready.  Initial call taken by: Lucy Antigua,  June 11, 2010 1:39 PM    Prescriptions: FUROSEMIDE 40 MG TABS (FUROSEMIDE) one daily, as needed to control peripheral edema  #90 x 4   Entered and Authorized by:   Gordy Savers  MD   Signed by:   Gordy Savers  MD on 06/11/2010   Method used:   Print then Give to Patient   RxID:   [id number] CILOSTAZOL 100 MG TABS (CILOSTAZOL) 1 tab at bedtime  #90 x 6   Entered and Authorized by:   Gordy Savers  MD   Signed by:   Gordy Savers  MD on 06/11/2010   Method used:   Print then Give to Patient   RxID:   [id number] FEXOFENADINE HCL 180 MG TABS (FEXOFENADINE HCL) 1 once daily as needed  #50 x 6   Entered and Authorized by:   Gordy Savers  MD   Signed by:   Gordy Savers  MD on 06/11/2010   Method used:   Print then Give to Patient   RxID:   [id number] SERTRALINE HCL 50 MG  TABS (SERTRALINE HCL) 1/2 tab at bedtime  #90 Tablet x 2   Entered and Authorized by:   Gordy Savers  MD   Signed by:   Gordy Savers  MD on 06/11/2010   Method used:   Print then Give to Patient   RxID:   [id number] CHLORTHALIDONE 25 MG  TABS (CHLORTHALIDONE) one daily  #90 x 4   Entered and Authorized by:   Gordy Savers  MD   Signed by:   Gordy Savers  MD on 06/11/2010   Method used:   Print then Give to Patient   RxID:   [id number] HYDROCODONE-ACETAMINOPHEN 5-500 MG  TABS (HYDROCODONE-ACETAMINOPHEN) 1/2 tab six times daily  #90 x 4   Entered and Authorized by:   Gordy Savers  MD  Signed by:   Gordy Savers  MD on 06/11/2010   Method used:   Print then Give to Patient   RxID:   [id number] SIMVASTATIN 80 MG  TABS (SIMVASTATIN) 1 once daily  #90 x 4   Entered and Authorized by:   Gordy Savers  MD   Signed by:   Gordy Savers  MD on 06/11/2010   Method used:   Print then Give to Patient   RxID:   [id number] NAPROXEN 500 MG TABS (NAPROXEN) 1 two times a day  #180 x 5   Entered and Authorized by:   Gordy Savers  MD   Signed by:   Gordy Savers  MD on 06/11/2010   Method used:   Print then Give to Patient   RxID:   [id number] LORAZEPAM 0.5 MG TABS (LORAZEPAM) Take 1 tablet bid  #180 x 1   Entered and Authorized by:   Gordy Savers  MD   Signed by:   Gordy Savers  MD on 06/11/2010   Method used:   Print then  Give to Patient   RxID:   [id number] IMDUR 30 MG TB24 (ISOSORBIDE MONONITRATE) 1 once daily  #90 x 6   Entered and Authorized by:   Gordy Savers  MD   Signed by:   Gordy Savers  MD on 06/11/2010   Method used:   Print then Give to Patient   RxID:   [id number] FELODIPINE 5 MG TB24 (FELODIPINE) 1 once daily  #90 x 6   Entered and Authorized by:   Gordy Savers  MD   Signed by:   Gordy Savers  MD on 06/11/2010   Method used:   Print then Give to Patient   RxID:   [id number] ALTACE 5 MG CAPS (RAMIPRIL) Take 1 capsule by mouth once a day  #90 x 6   Entered and Authorized by:   Gordy Savers  MD   Signed by:   Gordy Savers  MD on 06/11/2010   Method used:   Print then Give to Patient   RxID:   [id number] ADVAIR DISKUS 250-50 MCG/DOSE MISC (FLUTICASONE-SALMETEROL) Inhale 1 puff as directed twice a day  #3 x 6   Entered and Authorized by:   Gordy Savers  MD   Signed by:   Gordy Savers  MD on 06/11/2010   Method used:   Print then Give to Patient   RxID:   [id number]

## 2010-12-31 NOTE — Assessment & Plan Note (Signed)
Summary: pt will come in fasting/njr   Vital Signs:  Patient profile:   70 year old female Weight:      133 pounds BMI:     23.27 Temp:     98.0 degrees F oral BP sitting:   160 / 90  (right arm) Cuff size:   regular  Vitals Entered By: Duard Brady LPN (September 14, 2010 8:39 AM) CC: cpx - doing ok Is Patient Diabetic? No   Primary Care Crockett Rallo:  [name], MD   CC:  cpx - doing ok.  History of Present Illness: 70 year old patient who is seen today for a wellness exam.  She has been treated recently for a right middle lobe abscess and was hospitalized briefly following bronchoscopy.  She is scheduled for follow-up colonoscopy  next month.  She does have a history of colonic polyps.  She has coronary artery disease and peripheral vascular disease, which has been stable.  She has treated hypertension, and dyslipidemia.  Her pulmonary status has been stable.  Here for Medicare AWV:  1.   Risk factors based on Past M, S, F history:  patient has a history of hypertension, dyslipidemia, and perform after disease.  She is  status post bypass surgery 2.   Physical Activities: continues to a work ; no exertional chest pain 3.   Depression/mood: history depression presently stable 4.   Hearing: no deficits 5.   ADL's: independent in all aspects of daily living; enjoys full time employment 6.   Fall Risk: low 7.   Home Safety: no problems identified 8.   Height, weight, &visual acuity:height and weight stable.  No change in visual acuity 9.   Counseling: heart healthy diet, and follow-up colonoscopy.  Encouraged 10.   Labs ordered based on risk factors:  recent hospital admission, and no lab needed at this time 11.           Referral Coordination- colonoscopy and pulmonary  follow-up 12.           Care Plan- recently had mammogram.  Follow up colonoscopy scheduled 13.            Cognitive Assessment- alert and oriented, with normal affect.  No history of memory dysfunction.   Handles all executive functioning without difficulty    Allergies: 1)  Codeine Phosphate (Codeine Phosphate)  Past History:  Past Medical History: Reviewed history from 09/09/2010 and no changes required. CORONARY ARTERY DISEASE (ICD-414.00) PERIPHERAL VASCULAR DISEASE (ICD-443.9) HYPERTENSION (ICD-401.9) HYPERLIPIDEMIA (ICD-272.4) DEPRESSIVE DISORDER (ICD-311) OSTEOARTHRITIS (ICD-715.90) COLONIC POLYPS, HX OF (ICD-V12.72) lumbar spinal stenosis  right middle lobe abscess September 2011    -resolved on CXR 09/01/10    -neg FOB for endobronchial lesion RML GERD Anemia Congestive Heart Failure  Past Surgical History: Reviewed history from 08/18/2010 and no changes required. L subclavian bypass  September 2005 Tubal ligation Coronary artery bypass graft July 2001 Femoral popliteal bypass R.-June 2000  status post right common iliac PTA with stent placement, September 2001 status post redo right femoral l bypass February 2002; complicated by early thrombosis, requiring thrombectomy  aorto bifemoral bypass November 2004  colonoscopy October 2006 breast  bx (2) 2010 benign Dexa 12/09 carotid duplex- 6-09 Lumbar MRI 8-10 ABI 4-10 nuclear stress testing 5- 2011  negative for ischemia  Family History: Reviewed history from 09/09/2010 and no changes required. father died age 67.  History diabetes, cerebrovascular disease mother died at  age 30, status post hip surgery of an MI 5 brothers, one sister- one  brother deceased -DM, CAD, SDAT Positive diabetes, and coronary artery disease No FH of Colon Cancer:  Social History: Reviewed history from 09/09/2010 and no changes required. Former Smoker.  1 1/2 x 40 yrs.  Quit in 8 2001 2 children Works at [employer] as Scientist, physiological  Widowed Alcohol Use - no Daily Caffeine Use: 2 daily  Illicit Drug Use - no  Review of Systems  The patient denies anorexia, fever, weight loss, weight gain, vision loss, decreased  hearing, hoarseness, chest pain, syncope, dyspnea on exertion, peripheral edema, prolonged cough, headaches, hemoptysis, abdominal pain, melena, hematochezia, severe indigestion/heartburn, hematuria, incontinence, genital sores, muscle weakness, suspicious skin lesions, transient blindness, difficulty walking, depression, unusual weight change, abnormal bleeding, enlarged lymph nodes, angioedema, and breast masses.    Physical Exam  General:  Well-developed,well-nourished,in no acute distress; alert,appropriate and cooperative throughout examination Head:  Normocephalic and atraumatic without obvious abnormalities. No apparent alopecia or balding. Eyes:  No corneal or conjunctival inflammation noted. EOMI. Perrla. Funduscopic exam benign, without hemorrhages, exudates or papilledema. Vision grossly normal. Ears:  External ear exam shows no significant lesions or deformities.  Otoscopic examination reveals clear canals, tympanic membranes are intact bilaterally without bulging, retraction, inflammation or discharge. Hearing is grossly normal bilaterally. Mouth:  Oral mucosa and oropharynx without lesions or exudates.  Teeth in good repair. Neck:  bruits in the carotid and supraclavicular distribution Chest Wall:  No deformities, masses, or tenderness noted. status post sternotomy Breasts:  No mass, nodules, thickening, tenderness, bulging, retraction, inflamation, nipple discharge or skin changes noted.   Lungs:  Normal respiratory effort, chest expands symmetrically. Lungs are clear to auscultation, no crackles or wheezes. Heart:  grade 2/6 systolic murmur, loudest at the primary aortic area Abdomen:  surgical scars epigastric and bifemoral bruits no organomegaly Rectal:  deferred Genitalia:  Normal introitus for age, no external lesions, no vaginal discharge, mucosa pink and moist, no vaginal or cervical lesions, no vaginal atrophy, no friaility or hemorrhage, normal uterus size and position, no  adnexal masses or tenderness Msk:  No deformity or scoliosis noted of thoracic or lumbar spine.   Pulses:  posterior tibial pulses not easily palpable right dorsalis pedis pulse diminished compared to the left Extremities:  No clubbing, cyanosis, edema, or deformity noted with normal full range of motion of all joints.   Neurologic:  No cranial nerve deficits noted. Station and gait are normal. Plantar reflexes are down-going bilaterally. DTRs are symmetrical throughout. Sensory, motor and coordinative functions appear intact. Skin:  Intact without suspicious lesions or rashes Cervical Nodes:  No lymphadenopathy noted Axillary Nodes:  No palpable lymphadenopathy Inguinal Nodes:  No significant adenopathy Psych:  Cognition and judgment appear intact. Alert and cooperative with normal attention span and concentration. No apparent delusions, illusions, hallucinations   Impression & Recommendations:  Problem # 1:  Preventive Health Care (ICD-V70.0)  Complete Medication List: 1)  Advair Diskus 250-50 Mcg/dose Misc (Fluticasone-salmeterol) .... Inhale 1 puff as directed twice a day 2)  Altace 5 Mg Caps (Ramipril) .... Take 1 capsule by mouth once a day ** hold 3)  Felodipine 5 Mg Tb24 (Felodipine) .Marland Kitchen.. 1 once daily  ** hold 4)  Gabapentin 300 Mg Caps (Gabapentin) .Marland Kitchen.. 1 tab qam, 1 tab @ lunch, 2 tabs at bedtime 5)  Imdur 30 Mg Tb24 (Isosorbide mononitrate) .Marland Kitchen.. 1 once daily 6)  Lorazepam 0.5 Mg Tabs (Lorazepam) .... Take 1 tablet two times a day 7)  Simvastatin 80 Mg Tabs (Simvastatin) .Marland Kitchen.. 1 once daily 8)  Hydrocodone-acetaminophen 5-500 Mg Tabs (Hydrocodone-acetaminophen) .... 1/2 tab six times daily 9)  Chlorthalidone 25 Mg Tabs (Chlorthalidone) .... One daily ** hold 10)  Sertraline Hcl 50 Mg Tabs (Sertraline hcl) .... 1/2 tab at bedtime 11)  Voltaren 1 % Gel (Diclofenac sodium) .... Use once daily 12)  Fexofenadine Hcl 180 Mg Tabs (Fexofenadine hcl) .Marland Kitchen.. 1 once daily as needed 13)  Fish  Oil 1000 Mg Caps (Omega-3 fatty acids) .Marland Kitchen.. 1 cap once daily 14)  Aspirin 81 Mg Tbec (Aspirin) .... Take one tablet by mouth daily 15)  Multivitamins Tabs (Multiple vitamin) .Marland Kitchen.. 1 tab once daily 16)  Furosemide 40 Mg Tabs (Furosemide) .... One daily, as needed to control peripheral edema **hold** 17)  Omeprazole 20 Mg Tbec (Omeprazole) .Marland Kitchen.. 1 tab once daily 18)  Promethazine Hcl 25 Mg Tabs (Promethazine hcl) .... One every 6 hours for nausea as needed 19)  Tylenol 325 Mg Tabs (Acetaminophen) .... As needed 20)  Moviprep 100 Gm Solr (Peg-kcl-nacl-nasulf-na asc-c) .... As per prep instructions. 21)  Reglan 10 Mg Tabs (Metoclopramide hcl) .... Take 1 tab prior to drinking the colonoscopy prep  Other Orders: Medicare -1st Annual Wellness Visit [phone])  Patient Instructions: 1)  Please schedule a follow-up appointment in 4 months. 2)  Limit your Sodium (Salt). 3)  It is important that you exercise regularly at least 20 minutes 5 times a week. If you develop chest pain, have severe difficulty breathing, or feel very tired , stop exercising immediately and seek medical attention. 4)  Take calcium +Vitamin D daily. 5)  Take an Aspirin every day. Prescriptions: OMEPRAZOLE 20 MG TBEC (OMEPRAZOLE) 1 tab once daily  #90 x 5   Entered and Authorized by:   Gordy Savers  MD   Signed by:   Gordy Savers  MD on 09/14/2010   Method used:   Electronically to        [facility] [phone]* (retail)       [address]       [place], [place]  [address]       Ph: [phone] or [phone]       Fax: [phone]   RxID:   [id number] SERTRALINE HCL 50 MG  TABS (SERTRALINE HCL) 1/2 tab at bedtime  #90 Tablet x 2   Entered and Authorized by:   Gordy Savers  MD   Signed by:   Gordy Savers  MD on 09/14/2010   Method used:   Electronically to        [facility] [facility] [phone]* (retail)       [address]       [place], [place]  [address]       Ph: [phone]  or [phone]       Fax: [phone]   RxID:   [id number] CHLORTHALIDONE 25 MG  TABS (CHLORTHALIDONE) one daily ** HOLD  #90 x 6   Entered and Authorized by:   Gordy Savers  MD   Signed by:   Gordy Savers  MD on 09/14/2010   Method used:   Electronically to        [facility] [phone]* (retail)       [address]       [place], [place]  [address]       Ph: [phone]  or [phone]       Fax: [phone]   RxID:   [id number] SIMVASTATIN 80 MG  TABS (SIMVASTATIN) 1 once daily  #90 x 4   Entered and Authorized by:   Gordy Savers  MD   Signed by:   Gordy Savers  MD on 09/14/2010   Method used:   Electronically to        [facility] [phone]* (retail)       [address]       [place], [place]  [address]       Ph: [phone] or [phone]       Fax: [phone]   RxID:   [id number] IMDUR 30 MG TB24 (ISOSORBIDE MONONITRATE) 1 once daily  #90 x 6   Entered and Authorized by:   Gordy Savers  MD   Signed by:   Gordy Savers  MD on 09/14/2010   Method used:   Electronically to        [facility]  [address] [facility] [phone]* (retail)       [address] [place], [place]  [address]       Ph: [phone] or [phone]       Fax: [phone]   RxID:   [id number] GABAPENTIN 300 MG CAPS (GABAPENTIN) 1 tab qam, 1 tab @ lunch, 2 tabs at bedtime  #136 x 4   Entered and Authorized by:   Gordy Savers  MD   Signed by:   Gordy Savers  MD on 09/14/2010   Method used:   Electronically to        [facility] [phone]* (retail)       [address] [place], [place]  [address]       Ph: [phone] or [phone]       Fax: [phone]   RxID:   [id number] FELODIPINE 5 MG TB24 (FELODIPINE) 1 once daily  ** HOLD  #90 x 5   Entered and Authorized by:   Gordy Savers  MD   Signed by:   Gordy Savers  MD on 09/14/2010   Method used:   [facility]ronically to        [facility] [phone][facility] (retail)       [address] [place], [place]  [address]       Ph: [phone] or [phone]       Fax: [phone]   RxID:   [id number] ALTACE 5 MG CAPS (RAMIPRIL) Take 1 capsule by mouth once a day ** HOLD  #90 x 4   Entered and Authorized by:   Gordy Savers  MD   Signed by:   Gordy Savers  MD on 09/14/2010   Method used:   Electronically to        [facility] [phone]* (retail)       [address] [place], [place]  [address]       Ph: [phone] or [phone]       Fax: [phone]   RxID:   [id number] ADVAIR DISKUS 250-50 MCG/DOSE MISC (FLUTICASONE-SALMETEROL) Inhale 1  puff as directed twice a day  #3 x 6   Entered and Authorized by:   Gordy Savers  MD   Signed by:   Gordy Savers  MD on 09/14/2010   Method used:   Electronically to        [facility] [phone]* (retail)       [address]       [place], [place]  [address]       Ph: [phone] or [phone]       Fax: [phone]   RxID:   [id number]    Orders Added: 1)  Medicare -1st Annual Wellness Visit [G0438] 2)  Est. Patient Level III [47425]   Immunization History:  Influenza Immunization History:    Influenza:  Historical (08/29/2010)   Immunization History:  Influenza Immunization History:    Influenza:  Historical (08/29/2010)   per pt  KIK

## 2010-12-31 NOTE — Miscellaneous (Signed)
Summary: Controlled Substances Contract  Controlled Substances Contract   Imported By: Maryln Gottron 06/30/2010 09:39:55  _____________________________________________________________________  External Attachment:    Type:   Image     Comment:   External Document

## 2010-12-31 NOTE — Assessment & Plan Note (Signed)
Summary: cough is worse/dm   Vital Signs:  Patient profile:   70 year old female Weight:      149 pounds Temp:     98.0 degrees F oral BP sitting:   120 / 80  (right arm) Cuff size:   regular  Vitals Entered By: Duard Brady LPN (June 25, 2010 3:31 PM) CC: c/o cough   "tickle" , slightly productive Is Patient Diabetic? No   Primary Care Provider:  Gordy Savers  MD  CC:  c/o cough   "tickle"  and slightly productive.  History of Present Illness: 70 year old patient has a history of COPD, who presents with a chief complaint of nonproductive cough.  She describes a sense of irritation and frequent paroxysms refractory cough.  She has been placed on multiple anti-tussive medications.  She is also on Advair and then affected hand.  The pressure regimen includes Altace.  There's been no shortness of breath or sputum production or fever  Preventive Screening-Counseling & Management  Alcohol-Tobacco     Smoking Status: quit  Allergies: 1)  Codeine Phosphate (Codeine Phosphate)  Past History:  Past Medical History: Reviewed history from 12/19/2009 and no changes required. CORONARY ARTERY DISEASE (ICD-414.00) PERIPHERAL VASCULAR DISEASE (ICD-443.9) HYPERTENSION (ICD-401.9) HYPERLIPIDEMIA (ICD-272.4) DEPRESSIVE DISORDER (ICD-311) HEALTH SCREENING (ICD-V70.0) OSTEOARTHRITIS (ICD-715.90) COLONIC POLYPS, HX OF (ICD-V12.72) lumbar spinal stenosis   GERD  Social History: Smoking Status:  quit  Review of Systems       The patient complains of prolonged cough.  The patient denies anorexia, fever, weight loss, weight gain, vision loss, decreased hearing, hoarseness, chest pain, syncope, dyspnea on exertion, peripheral edema, headaches, hemoptysis, abdominal pain, melena, hematochezia, severe indigestion/heartburn, hematuria, incontinence, genital sores, muscle weakness, suspicious skin lesions, transient blindness, difficulty walking, depression, unusual weight change,  abnormal bleeding, enlarged lymph nodes, angioedema, and breast masses.    Physical Exam  General:  Well-developed,well-nourished,in no acute distress; alert,appropriate and cooperative throughout examination Head:  Normocephalic and atraumatic without obvious abnormalities. No apparent alopecia or balding. Mouth:  Oral mucosa and oropharynx without lesions or exudates.  Teeth in good repair. Neck:  No deformities, masses, or tenderness noted. Lungs:  Normal respiratory effort, chest expands symmetrically. Lungs are clear to auscultation, no crackles or wheezes. Heart:  Normal rate and regular rhythm. S1 and S2 normal without gallop, murmur, click, rub or other extra sounds.   Impression & Recommendations:  Problem # 1:  COUGH (ICD-786.2)  Problem # 2:  CAD, ARTERY BYPASS GRAFT (ICD-414.04)  Her updated medication list for this problem includes:    Altace 5 Mg Caps (Ramipril) .Marland Kitchen... Take 1 capsule by mouth once a day    Felodipine 5 Mg Tb24 (Felodipine) .Marland Kitchen... 1 once daily    Imdur 30 Mg Tb24 (Isosorbide mononitrate) .Marland Kitchen... 1 once daily    Chlorthalidone 25 Mg Tabs (Chlorthalidone) ..... One daily    Cilostazol 100 Mg Tabs (Cilostazol) .Marland Kitchen... 1 tab at bedtime    Aspirin 81 Mg Tbec (Aspirin) .Marland Kitchen... Take one tablet by mouth daily    Furosemide 40 Mg Tabs (Furosemide) ..... One daily, as needed to control peripheral edema  Her updated medication list for this problem includes:    Altace 5 Mg Caps (Ramipril) .Marland Kitchen... Take 1 capsule by mouth once a day    Felodipine 5 Mg Tb24 (Felodipine) .Marland Kitchen... 1 once daily    Imdur 30 Mg Tb24 (Isosorbide mononitrate) .Marland Kitchen... 1 once daily    Chlorthalidone 25 Mg Tabs (Chlorthalidone) ..... One daily  Cilostazol 100 Mg Tabs (Cilostazol) .Marland Kitchen... 1 tab at bedtime    Aspirin 81 Mg Tbec (Aspirin) .Marland Kitchen... Take one tablet by mouth daily    Furosemide 40 Mg Tabs (Furosemide) ..... One daily, as needed to control peripheral edema  Problem # 3:  TOBACCO USE, QUIT  (ICD-V15.82)  Complete Medication List: 1)  Advair Diskus 250-50 Mcg/dose Misc (Fluticasone-salmeterol) .... Inhale 1 puff as directed twice a day 2)  Altace 5 Mg Caps (Ramipril) .... Take 1 capsule by mouth once a day 3)  Felodipine 5 Mg Tb24 (Felodipine) .Marland Kitchen.. 1 once daily 4)  Gabapentin 300 Mg Caps (Gabapentin) .Marland Kitchen.. 1 tab qam, 1 tab @ lunch, 2 tabs at bedtime 5)  Imdur 30 Mg Tb24 (Isosorbide mononitrate) .Marland Kitchen.. 1 once daily 6)  Lorazepam 0.5 Mg Tabs (Lorazepam) .... Take 1 tablet bid 7)  Naproxen 500 Mg Tabs (Naproxen) .Marland Kitchen.. 1 two times a day 8)  Simvastatin 80 Mg Tabs (Simvastatin) .Marland Kitchen.. 1 once daily 9)  Hydrocodone-acetaminophen 5-500 Mg Tabs (Hydrocodone-acetaminophen) .... 1/2 tab six times daily 10)  Chlorthalidone 25 Mg Tabs (Chlorthalidone) .... One daily 11)  Sertraline Hcl 50 Mg Tabs (Sertraline hcl) .... 1/2 tab at bedtime 12)  Voltaren 1 % Gel (Diclofenac sodium) .... Use once daily 13)  Fexofenadine Hcl 180 Mg Tabs (Fexofenadine hcl) .Marland Kitchen.. 1 once daily as needed 14)  Cilostazol 100 Mg Tabs (Cilostazol) .Marland Kitchen.. 1 tab at bedtime 15)  Fish Oil 1000 Mg Caps (Omega-3 fatty acids) .Marland Kitchen.. 1 cap once daily 16)  Aspirin 81 Mg Tbec (Aspirin) .... Take one tablet by mouth daily 17)  Multivitamins Tabs (Multiple vitamin) .Marland Kitchen.. 1 tab once daily 18)  Furosemide 40 Mg Tabs (Furosemide) .... One daily, as needed to control peripheral edema 19)  Omeprazole 20 Mg Tbec (Omeprazole) .Marland Kitchen.. 1 tab once daily 20)  Tessalon 200 Mg Caps (Benzonatate) .... One tab every 8 hours for cough  Patient Instructions: 1)  Drink as much fluid as you can tolerate for the next few days. 2)  Get plenty of rest, drink lots of clear liquids, and use Tylenol  for fever and comfort. Return in 7-10 days if you're not better:sooner if you're feeling worse.

## 2010-12-31 NOTE — Assessment & Plan Note (Signed)
Summary: constant cough/dm   Vital Signs:  Patient profile:   70 year old female Weight:      148 pounds Temp:     98.2 degrees F BP sitting:   110 / 60  (left arm)  Vitals Entered By: Pura Spice, RN (July 07, 2010 2:20 PM) CC:  cough x 3 wks    History of Present Illness: This 70 year old white female widowed was seen July 28 by Dr. Kirtland Bouchard. Singh and treated for cough which he states now it is been present for 3 weeks she states the cough and congestion has increased in severity since last week and now has a rather productive cough of dark yellow sputum. Has a pO2 of 89%. Was a smoker but quit smoking 2000 had been on Advair 250-50 but increased to 500-50 last week. She is uncertain as to whether she had any fever some general female age continues to have a persistent cough\par Past history of coronary artery disease and a bypass graft x3 and cerebral peripheral vascular surgery including the aorta but is doing well Hypertension well controlled at 110/60 Peripheral edema control her diuretic  Preventive Screening-Counseling & Management  Alcohol-Tobacco     Smoking Status: quit     Smoke Cessation Stage: quit     Packs/Day: 1.5     Year Started: 1960     Year Quit: 1990  Allergies: 1)  Codeine Phosphate (Codeine Phosphate)  Past History:  Past Medical History: Last updated: 12/19/2009 CORONARY ARTERY DISEASE (ICD-414.00) PERIPHERAL VASCULAR DISEASE (ICD-443.9) HYPERTENSION (ICD-401.9) HYPERLIPIDEMIA (ICD-272.4) DEPRESSIVE DISORDER (ICD-311) HEALTH SCREENING (ICD-V70.0) OSTEOARTHRITIS (ICD-715.90) COLONIC POLYPS, HX OF (ICD-V12.72) lumbar spinal stenosis   GERD  Past Surgical History: Last updated: 06/11/2010 L subclavian bypass  September 2005 Tubal ligation Coronary artery bypass graft July 2001 Femoral popliteal bypass R.-June 2000  status post right common iliac PTA with stent placement, September 2001 status post redo right femoral until bypass February 2002;  complicated by early thrombosis, requiring thrombectomy  aorto bifemoral bypass November 2004  colonoscopy October 2006 breat bx (2) 2010 Dexa 12/09 carotid duplex- 6-09 Lumbar MRI 8-10 ABI 4-10 nuclear stress testing 5- 2011  Social History: Last updated: 02/12/2008 Former Smoker Divorced  Risk Factors: Smoking Status: quit (07/07/2010) Packs/Day: 1.5 (07/07/2010)  Social History: Packs/Day:  1.5  Review of Systems      See HPI  The patient denies anorexia, fever, weight loss, weight gain, vision loss, decreased hearing, hoarseness, chest pain, syncope, dyspnea on exertion, peripheral edema, prolonged cough, headaches, hemoptysis, abdominal pain, melena, hematochezia, severe indigestion/heartburn, hematuria, incontinence, genital sores, muscle weakness, suspicious skin lesions, transient blindness, difficulty walking, depression, unusual weight change, abnormal bleeding, enlarged lymph nodes, angioedema, breast masses, and testicular masses.    Physical Exam  General:  Well-developed,well-nourished,in no acute distress; alert,appropriate and cooperative throughout examination Head:  Normocephalic and atraumatic without obvious abnormalities. No apparent alopecia or balding. Eyes:  No corneal or conjunctival inflammation noted. EOMI. Perrla. Funduscopic exam benign, without hemorrhages, exudates or papilledema. Vision grossly normal. Ears:  External ear exam shows no significant lesions or deformities.  Otoscopic examination reveals clear canals, tympanic membranes are intact bilaterally without bulging, retraction, inflammation or discharge. Hearing is grossly normal bilaterally. Nose:  External nasal examination shows no deformity or inflammation. Nasal mucosa are pink and moist without lesions or exudates. Mouth:  Oral mucosa and oropharynx without lesions or exudates.  Teeth in good repair. Chest Wall:  CABG scar Breasts:  not examined Lungs:  rhonchi bilaterally  with a few  rales at the left base No true dullness to percussion, no wheezing Heart:  grade 2 aortic murmurregular rhythm and no gallop.   Extremities:  no name   Impression & Recommendations:  Problem # 1:  ACUTE BRONCHITIS (ICD-466.0) Assessment New  Her updated medication list for this problem includes:    Advair Diskus 250-50 Mcg/dose Misc (Fluticasone-salmeterol) ..... Inhale 1 puff as directed twice a day    Tessalon 200 Mg Caps (Benzonatate) ..... One tab every 8 hours for cough    Hydromet 5-1.5 Mg/76ml Syrp (Hydrocodone-homatropine) .Marland Kitchen... 2 tsp q4- h as needed cough as needed cough    Levaquin 750 Mg Tabs (Levofloxacin) .Marland Kitchen... 1 once daily for lung infection  Orders: Prescription Created Electronically [phone])  Problem # 2:  COUGH (ICD-786.2) Assessment: Unchanged  Orders: Depo- Medrol 80mg  (J1040) Depo- Medrol 40mg  (J1030) Admin of Therapeutic Inj  intramuscular or subcutaneous (60454)  Problem # 3:  CAD, ARTERY BYPASS GRAFT (ICD-414.04) Assessment: Unchanged  Her updated medication list for this problem includes:    Altace 5 Mg Caps (Ramipril) .Marland Kitchen... Take 1 capsule by mouth once a day    Felodipine 5 Mg Tb24 (Felodipine) .Marland Kitchen... 1 once daily    Imdur 30 Mg Tb24 (Isosorbide mononitrate) .Marland Kitchen... 1 once daily    Chlorthalidone 25 Mg Tabs (Chlorthalidone) ..... One daily    Cilostazol 100 Mg Tabs (Cilostazol) .Marland Kitchen... 1 tab at bedtime    Aspirin 81 Mg Tbec (Aspirin) .Marland Kitchen... Take one tablet by mouth daily    Furosemide 40 Mg Tabs (Furosemide) ..... One daily, as needed to control peripheral edema  Problem # 4:  PEDAL EDEMA (ICD-782.3) Assessment: Improved  Her updated medication list for this problem includes:    Chlorthalidone 25 Mg Tabs (Chlorthalidone) ..... One daily    Furosemide 40 Mg Tabs (Furosemide) ..... One daily, as needed to control peripheral edema  Problem # 5:  HYPERTENSION (ICD-401.9) Assessment: Improved  Her updated medication list for this problem includes:     Altace 5 Mg Caps (Ramipril) .Marland Kitchen... Take 1 capsule by mouth once a day    Felodipine 5 Mg Tb24 (Felodipine) .Marland Kitchen... 1 once daily    Chlorthalidone 25 Mg Tabs (Chlorthalidone) ..... One daily    Furosemide 40 Mg Tabs (Furosemide) ..... One daily, as needed to control peripheral edema  Complete Medication List: 1)  Advair Diskus 250-50 Mcg/dose Misc (Fluticasone-salmeterol) .... Inhale 1 puff as directed twice a day 2)  Altace 5 Mg Caps (Ramipril) .... Take 1 capsule by mouth once a day 3)  Felodipine 5 Mg Tb24 (Felodipine) .Marland Kitchen.. 1 once daily 4)  Gabapentin 300 Mg Caps (Gabapentin) .Marland Kitchen.. 1 tab qam, 1 tab @ lunch, 2 tabs at bedtime 5)  Imdur 30 Mg Tb24 (Isosorbide mononitrate) .Marland Kitchen.. 1 once daily 6)  Lorazepam 0.5 Mg Tabs (Lorazepam) .... Take 1 tablet bid 7)  Naproxen 500 Mg Tabs (Naproxen) .Marland Kitchen.. 1 two times a day 8)  Simvastatin 80 Mg Tabs (Simvastatin) .Marland Kitchen.. 1 once daily 9)  Hydrocodone-acetaminophen 5-500 Mg Tabs (Hydrocodone-acetaminophen) .... 1/2 tab six times daily 10)  Chlorthalidone 25 Mg Tabs (Chlorthalidone) .... One daily 11)  Sertraline Hcl 50 Mg Tabs (Sertraline hcl) .... 1/2 tab at bedtime 12)  Voltaren 1 % Gel (Diclofenac sodium) .... Use once daily 13)  Fexofenadine Hcl 180 Mg Tabs (Fexofenadine hcl) .Marland Kitchen.. 1 once daily as needed 14)  Cilostazol 100 Mg Tabs (Cilostazol) .Marland Kitchen.. 1 tab at bedtime 15)  Fish Oil 1000 Mg Caps (Omega-3 fatty  acids) .Marland Kitchen.. 1 cap once daily 16)  Aspirin 81 Mg Tbec (Aspirin) .... Take one tablet by mouth daily 17)  Multivitamins Tabs (Multiple vitamin) .Marland Kitchen.. 1 tab once daily 18)  Furosemide 40 Mg Tabs (Furosemide) .... One daily, as needed to control peripheral edema 19)  Omeprazole 20 Mg Tbec (Omeprazole) .Marland Kitchen.. 1 tab once daily 20)  Tessalon 200 Mg Caps (Benzonatate) .... One tab every 8 hours for cough 21)  Hydromet 5-1.5 Mg/83ml Syrp (Hydrocodone-homatropine) .... 2 tsp q4- h as needed cough as needed cough 22)  Levaquin 750 Mg Tabs (Levofloxacin) .Marland Kitchen.. 1 once daily  for lung infection  Patient Instructions: 1)  acute bronchitis 2)  Levaquin 750 mg once daily for 7 days for infection 3)  hydromet for cough 4)  take mucinexDM Maximum strength 1 AM 1 PM 5)  continue Advair as prescribed 6)  stay wiell hydrated, good fluid intake 7)  Depomedrol 120 mg IM 8)  continue regular medications Prescriptions: LEVAQUIN 750 MG TABS (LEVOFLOXACIN) 1 once daily for lung infection  #7 x 0   Entered and Authorized by:   Judithann Sheen MD   Signed by:   Judithann Sheen MD on 07/07/2010   Method used:   [facility]        [phone]ail[facility]       [address] [place], [place]  [address]       Ph: [phone] or [phone]       Fax: [phone]   RxID:   [id number] HYDROMET 5-1.5 MG/5ML SYRP (HYDROCODONE-HOMATROPINE) 2 tsp q4- h as needed cough as needed cough  #240cc x 1   Entered and Authorized by:   Judithann Sheen MD   Signed by:   Judithann Sheen MD on 07/07/2010   Method used:   Print then Give to Patient   RxID:   [id number] HYDROMET 5-1.5 MG/5ML SYRP (HYDROCODONE-HOMATROPINE) 2 tsp q4- h as needed cough as needed cough  #360 cc x 1   Entered and Authorized by:   Judithann Sheen MD   Signed by:   Judithann Sheen MD on 07/07/2010   Method used:   Print then Give to Patient   RxID:   [id number]    Medication Administration  Injection # 1:    Medication: Depo- Medrol 80mg     Diagnosis: COUGH (ICD-786.2)    Route: IM    Site: RUOQ gluteus    Exp Date: 02/2013    Lot #: [id number]    Mfr: [employer]    Patient tolerated injection without complications    Given by: Pura Spice, RN (July 07, 2010 4:32 PM)  Injection # 2:    Medication: Depo- Medrol 40mg     Diagnosis: COUGH (ICD-786.2)    Route: IM    Site: RUOQ gluteus    Exp Date: 02/2013    Lot #: [id number]    Mfr: [employer]  Orders Added: 1)  Depo- Medrol 80mg  [J1040] 2)  Depo- Medrol 40mg  [J1030] 3)  Admin  of Therapeutic Inj  intramuscular or subcutaneous [96372] 4)  Prescription Created Electronically [G8553] 5)  Est. Patient Level IV [60630]

## 2010-12-31 NOTE — Procedures (Signed)
Summary: Upper Endoscopy  Patient: [name] Note: All result statuses are Final unless otherwise noted.  Tests: (1) Upper Endoscopy (EGD)   EGD Upper Endoscopy       DONE     [facility]     [address]     [place], [place]  [address]           ENDOSCOPY PROCEDURE REPORT     PATIENT:  [name], [name]  MR#:  [id number]     BIRTHDATE:  [birth year], 69 yrs. old  GENDER:  female     ENDOSCOPIST:  Judie Petit T. Russella Dar, MD, [facility]           PROCEDURE DATE:  10/19/2010     PROCEDURE:  EGD, diagnostic 43235     ASA CLASS:  Class II     INDICATIONS:  iron deficiency anemia, GERD     MEDICATIONS:  There was residual sedation effect present from     prior procedure, Versed 2 mg IV     TOPICAL ANESTHETIC:  Exactacain Spray     DESCRIPTION OF PROCEDURE:   After the risks benefits and     alternatives of the procedure were thoroughly explained, informed     consent was obtained.  The [facility] [id number] endoscope was     introduced through the mouth and advanced to the second portion of     the duodenum, without limitations.  The instrument was slowly     withdrawn as the mucosa was fully examined.     <<PROCEDUREIMAGES>>     The esophagus and gastroesophageal junction were completely normal     in appearance.  The stomach was entered and closely examined. The     pylorus, antrum, angularis, and lesser curvature were well     visualized, including a retroflexed view of the cardia and fundus.     The stomach wall was normally distensable. The scope passed easily     through the pylorus into the duodenum. The duodenal bulb was     normal in appearance, as was the postbulbar duodenum. Retroflexed     views revealed a hiatal hernia, 3 cm.  The scope was then     withdrawn from the patient and the procedure completed.           COMPLICATIONS:  None           ENDOSCOPIC IMPRESSION:     1) 3 cm hiatal hernia           RECOMMENDATIONS:     1) Anti-reflux regimen long term     2) PPI  qam           Malcolm T. Russella Dar, MD, [facility] Graham           n.     eSIGNED:   Venita Lick. Stark at 10/19/2010 03:14 PM           Marcha Dutton, [id number]  Note: An exclamation mark (!) indicates a result that was not dispersed into the flowsheet. Document Creation Date: 10/19/2010 3:14 PM _______________________________________________________________________  (1) Order result status: Final Collection or observation date-time: 10/19/2010 14:54 Requested date-time:  Receipt date-time:  Reported date-time:  Referring Physician:   Ordering Physician: Claudette Head [phone]) Specimen Source:  Source: Launa Grill Order Number: [id number] Lab site:

## 2011-01-04 ENCOUNTER — Other Ambulatory Visit: Payer: Self-pay | Admitting: Internal Medicine

## 2011-01-04 DIAGNOSIS — R110 Nausea: Secondary | ICD-10-CM

## 2011-01-08 ENCOUNTER — Encounter: Payer: Self-pay | Admitting: Internal Medicine

## 2011-01-11 ENCOUNTER — Encounter: Payer: Self-pay | Admitting: Internal Medicine

## 2011-01-11 ENCOUNTER — Ambulatory Visit (INDEPENDENT_AMBULATORY_CARE_PROVIDER_SITE_OTHER): Payer: Medicare Other | Admitting: Internal Medicine

## 2011-01-11 DIAGNOSIS — I739 Peripheral vascular disease, unspecified: Secondary | ICD-10-CM

## 2011-01-11 DIAGNOSIS — I10 Essential (primary) hypertension: Secondary | ICD-10-CM

## 2011-01-11 DIAGNOSIS — E785 Hyperlipidemia, unspecified: Secondary | ICD-10-CM

## 2011-01-11 DIAGNOSIS — I2510 Atherosclerotic heart disease of native coronary artery without angina pectoris: Secondary | ICD-10-CM

## 2011-01-11 DIAGNOSIS — M1990 Unspecified osteoarthritis, unspecified site: Secondary | ICD-10-CM

## 2011-01-11 NOTE — Patient Instructions (Signed)
Limit your sodium (Salt) intake   It is important that you exercise regularly, at least 20 minutes 3 to 4 times per week.  If you develop chest pain or shortness of breath seek  medical attention.  Return in 4 months for follow-up  Lamisil cream-apply to rash twice daily

## 2011-01-11 NOTE — Progress Notes (Signed)
  Subjective:    Patient ID: [name], female    DOB: 06/15/1941, 70 y.o.   MRN: [id number]  HPI  70 Distinct for follow-up of her coronary artery disease, and Peripheral  vascular disease.  She denies any ischemic symptoms.  Two weeks ago.  She had some vomiting and diarrhea associate with some extreme dizziness.  Implications of online contraction in the setting of her multiple medications.  Discussed.  She has had no exertional chest pain or claudication She has a history of osteoarthritis, which has been reasonably stable. She has dyslipidemia and is maintained on simvastatin 80 mg daily.  She continues to tolerate this well.  She has some mild sinus symptoms today.  Otherwise, doing quite well.    Review of Systems  Constitutional: Negative.   HENT: Positive for congestion. Negative for hearing loss, sore throat, rhinorrhea, dental problem, sinus pressure and tinnitus.   Eyes: Negative for pain, discharge and visual disturbance.  Respiratory: Negative for cough and shortness of breath.   Cardiovascular: Negative for chest pain, palpitations and leg swelling.  Gastrointestinal: Negative for nausea, vomiting, abdominal pain, diarrhea, constipation, blood in stool and abdominal distention.  Genitourinary: Negative for dysuria, urgency, frequency, hematuria, flank pain, vaginal bleeding, vaginal discharge, difficulty urinating, vaginal pain and pelvic pain.  Musculoskeletal: Negative for joint swelling, arthralgias and gait problem.  Skin: Negative for rash.  Neurological: Negative for dizziness, syncope, speech difficulty, weakness, numbness and headaches.  Hematological: Negative for adenopathy.  Psychiatric/Behavioral: Negative for behavioral problems, dysphoric mood and agitation. The patient is not nervous/anxious.        Objective:   Physical Exam  Constitutional: She is oriented to person, place, and time. She appears well-developed and well-nourished.  HENT:  Head:  Normocephalic.  Right Ear: External ear normal.  Left Ear: External ear normal.  Mouth/Throat: Oropharynx is clear and moist.  Eyes: Conjunctivae and EOM are normal. Pupils are equal, round, and reactive to light.  Neck: Normal range of motion. Neck supple. No thyromegaly present.       Bilateral bruits  Cardiovascular: Normal rate, regular rhythm and normal heart sounds.        Pedal pulses diminished, except for the left dorsalis pedis pulse  Pulmonary/Chest: Effort normal and breath sounds normal.  Abdominal: Soft. Bowel sounds are normal. She exhibits no mass. There is no tenderness.  Musculoskeletal: Normal range of motion.  Lymphadenopathy:    She has no cervical adenopathy.  Neurological: She is alert and oriented to person, place, and time.  Skin: Skin is warm and dry. No rash noted.  Psychiatric: She has a normal mood and affect. Her behavior is normal.          Assessment & Plan:  Coronary artery disease-stable Peripheral vascular occlusive disease, stable-will continue aggressive risk factor modification Osteoarthritis.  Will continue diclofenac gel and p.r.n. Analgesics Dyslipidemia, stable  Will see for annual exam in 4 months

## 2011-02-11 LAB — CBC
HCT: 20.9 % — ABNORMAL LOW (ref 36.0–46.0)
HCT: 25.6 % — ABNORMAL LOW (ref 36.0–46.0)
HCT: 27.8 % — ABNORMAL LOW (ref 36.0–46.0)
HCT: 29.6 % — ABNORMAL LOW (ref 36.0–46.0)
HCT: 30.4 % — ABNORMAL LOW (ref 36.0–46.0)
Hemoglobin: 10.1 g/dL — ABNORMAL LOW (ref 12.0–15.0)
Hemoglobin: 7.6 g/dL — ABNORMAL LOW (ref 12.0–15.0)
Hemoglobin: 9.2 g/dL — ABNORMAL LOW (ref 12.0–15.0)
MCH: 27.8 pg (ref 26.0–34.0)
MCH: 27.9 pg (ref 26.0–34.0)
MCHC: 33.2 g/dL (ref 30.0–36.0)
MCV: 84.1 fL (ref 78.0–100.0)
MCV: 84.1 fL (ref 78.0–100.0)
MCV: 84.6 fL (ref 78.0–100.0)
MCV: 84.8 fL (ref 78.0–100.0)
Platelets: 135 10*3/uL — ABNORMAL LOW (ref 150–400)
RBC: 2.8 MIL/uL — ABNORMAL LOW (ref 3.87–5.11)
RBC: 3.05 MIL/uL — ABNORMAL LOW (ref 3.87–5.11)
RBC: 3.27 MIL/uL — ABNORMAL LOW (ref 3.87–5.11)
RDW: 18.7 % — ABNORMAL HIGH (ref 11.5–15.5)
RDW: 19 % — ABNORMAL HIGH (ref 11.5–15.5)
RDW: 19.1 % — ABNORMAL HIGH (ref 11.5–15.5)
RDW: 20.2 % — ABNORMAL HIGH (ref 11.5–15.5)
WBC: 4.9 10*3/uL (ref 4.0–10.5)
WBC: 6.3 10*3/uL (ref 4.0–10.5)
WBC: 6.4 10*3/uL (ref 4.0–10.5)
WBC: 6.8 10*3/uL (ref 4.0–10.5)
WBC: 8.6 10*3/uL (ref 4.0–10.5)

## 2011-02-11 LAB — AFB CULTURE WITH SMEAR (NOT AT ARMC): Acid Fast Smear: NONE SEEN

## 2011-02-11 LAB — COMPREHENSIVE METABOLIC PANEL
ALT: 12 U/L (ref 0–35)
AST: 19 U/L (ref 0–37)
Calcium: 8.3 mg/dL — ABNORMAL LOW (ref 8.4–10.5)
Creatinine, Ser: 1.21 mg/dL — ABNORMAL HIGH (ref 0.4–1.2)
GFR calc Af Amer: 54 mL/min — ABNORMAL LOW (ref >60)
Glucose, Bld: 101 mg/dL — ABNORMAL HIGH (ref 70–99)
Sodium: 142 mEq/L (ref 135–145)
Total Protein: 6.3 g/dL (ref 6.0–8.3)

## 2011-02-11 LAB — BASIC METABOLIC PANEL
BUN: 11 mg/dL (ref 6–23)
BUN: 14 mg/dL (ref 6–23)
CO2: 27 mEq/L (ref 19–32)
CO2: 28 mEq/L (ref 19–32)
Calcium: 8.5 mg/dL (ref 8.4–10.5)
Chloride: 105 mEq/L (ref 96–112)
Chloride: 110 mEq/L (ref 96–112)
Creatinine, Ser: 1.12 mg/dL (ref 0.4–1.2)
GFR calc Af Amer: 59 mL/min — ABNORMAL LOW (ref >60)
GFR calc non Af Amer: 49 mL/min — ABNORMAL LOW (ref >60)
Potassium: 3.5 mEq/L (ref 3.5–5.1)
Potassium: 3.6 mEq/L (ref 3.5–5.1)
Potassium: 4.9 mEq/L (ref 3.5–5.1)

## 2011-02-11 LAB — DIFFERENTIAL
Basophils Absolute: 0 10*3/uL (ref 0.0–0.1)
Basophils Absolute: 0 10*3/uL (ref 0.0–0.1)
Basophils Absolute: 0 10*3/uL (ref 0.0–0.1)
Basophils Relative: 0 % (ref 0–1)
Eosinophils Absolute: 0.2 10*3/uL (ref 0.0–0.7)
Eosinophils Relative: 1 % (ref 0–5)
Eosinophils Relative: 3 % (ref 0–5)
Lymphocytes Relative: 17 % (ref 12–46)
Lymphocytes Relative: 22 % (ref 12–46)
Lymphocytes Relative: 33 % (ref 12–46)
Lymphs Abs: 1.2 10*3/uL (ref 0.7–4.0)
Lymphs Abs: 2.1 10*3/uL (ref 0.7–4.0)
Monocytes Absolute: 0.5 10*3/uL (ref 0.1–1.0)
Monocytes Absolute: 0.6 10*3/uL (ref 0.1–1.0)
Monocytes Relative: 7 % (ref 3–12)
Neutro Abs: 3.5 10*3/uL (ref 1.7–7.7)
Neutro Abs: 5.1 10*3/uL (ref 1.7–7.7)
Neutrophils Relative %: 71 % (ref 43–77)

## 2011-02-11 LAB — TROPONIN I
Troponin I: 0.18 ng/mL — ABNORMAL HIGH (ref 0.00–0.06)
Troponin I: 0.46 ng/mL — ABNORMAL HIGH (ref 0.00–0.06)

## 2011-02-11 LAB — RETICULOCYTES
RBC.: 2.78 MIL/uL — ABNORMAL LOW (ref 3.87–5.11)
Retic Count, Absolute: 52.8 10*3/uL (ref 19.0–186.0)

## 2011-02-11 LAB — HAPTOGLOBIN: Haptoglobin: 394 mg/dL — ABNORMAL HIGH (ref 16–200)

## 2011-02-11 LAB — CROSSMATCH

## 2011-02-11 LAB — APTT: aPTT: 31 seconds (ref 24–37)

## 2011-02-11 LAB — CK TOTAL AND CKMB (NOT AT ARMC)
Relative Index: INVALID (ref 0.0–2.5)
Relative Index: INVALID (ref 0.0–2.5)
Total CK: 76 U/L (ref 7–177)

## 2011-02-11 LAB — HEMOCCULT GUIAC POC 1CARD (OFFICE)
Fecal Occult Bld: POSITIVE
Fecal Occult Bld: POSITIVE

## 2011-02-11 LAB — MRSA PCR SCREENING: MRSA by PCR: NEGATIVE

## 2011-02-11 LAB — MAGNESIUM: Magnesium: 1.5 mg/dL (ref 1.5–2.5)

## 2011-02-11 LAB — FERRITIN: Ferritin: 53 ng/mL (ref 10–291)

## 2011-02-11 LAB — BRAIN NATRIURETIC PEPTIDE
Pro B Natriuretic peptide (BNP): 155 pg/mL — ABNORMAL HIGH (ref 0.0–100.0)
Pro B Natriuretic peptide (BNP): 684 pg/mL — ABNORMAL HIGH (ref 0.0–100.0)
Pro B Natriuretic peptide (BNP): 739 pg/mL — ABNORMAL HIGH (ref 0.0–100.0)

## 2011-02-11 LAB — FUNGUS CULTURE W SMEAR: Fungal Smear: NONE SEEN

## 2011-02-11 LAB — PROTIME-INR: Prothrombin Time: 14.6 seconds (ref 11.6–15.2)

## 2011-02-11 LAB — CULTURE, RESPIRATORY W GRAM STAIN

## 2011-02-11 LAB — LEGIONELLA PROFILE(CULTURE+DFA/SMEAR)

## 2011-02-11 LAB — FOLATE: Folate: 16.7 ng/mL

## 2011-02-11 LAB — PHOSPHORUS: Phosphorus: 3.2 mg/dL (ref 2.3–4.6)

## 2011-02-24 ENCOUNTER — Other Ambulatory Visit: Payer: Self-pay | Admitting: Internal Medicine

## 2011-02-25 ENCOUNTER — Telehealth: Payer: Self-pay

## 2011-02-25 MED ORDER — LORAZEPAM 0.5 MG PO TABS: 0.5000 mg | ORAL_TABLET | Freq: Two times a day (BID) | ORAL | Status: DC

## 2011-02-25 NOTE — Telephone Encounter (Signed)
#  50  OK  RF 2

## 2011-02-25 NOTE — Telephone Encounter (Signed)
I recv'd refill request [facility]. Ativan 0.5mg  1 po bid.  I see where in emr , med listed , but never rx'd by Dr. Amador Cunas . Pt last seen in office 09/14/10.  [facility] - they reviewed [facility] and they can not tell me who rx'd med. They destroy rx after a certain amt of time, record for them only go back to 2010, pt has not had filled anytime in the last 2 yrs.  Please advise

## 2011-02-25 NOTE — Telephone Encounter (Signed)
[facility] and gave rx , change to med list done

## 2011-02-26 ENCOUNTER — Telehealth: Payer: Self-pay | Admitting: Internal Medicine

## 2011-02-26 MED ORDER — CILOSTAZOL 100 MG PO TABS: 100.0000 mg | ORAL_TABLET | Freq: Two times a day (BID) | ORAL | Status: DC

## 2011-02-26 NOTE — Telephone Encounter (Signed)
Please advise 

## 2011-02-26 NOTE — Telephone Encounter (Signed)
Pt aware called in rx

## 2011-02-26 NOTE — Telephone Encounter (Signed)
Okay to refill? 
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
## 2011-03-01 ENCOUNTER — Telehealth: Payer: Self-pay

## 2011-03-01 NOTE — Telephone Encounter (Signed)
I am unaware of any substitute for this

## 2011-03-01 NOTE — Telephone Encounter (Signed)
Rx for volteran gel is on back order and pharm wants to know if Dr. Kirtland Bouchard will sub a different med.

## 2011-03-01 NOTE — Telephone Encounter (Signed)
[facility] - left msg r/t unaware of any sub avilb. KIK

## 2011-03-08 LAB — CBC
HCT: 36.2 % (ref 36.0–46.0)
Hemoglobin: 12.2 g/dL (ref 12.0–15.0)
WBC: 7.5 10*3/uL (ref 4.0–10.5)

## 2011-03-08 LAB — DIFFERENTIAL
Eosinophils Relative: 3 % (ref 0–5)
Lymphocytes Relative: 27 % (ref 12–46)
Lymphs Abs: 2 10*3/uL (ref 0.7–4.0)
Monocytes Absolute: 0.5 10*3/uL (ref 0.1–1.0)

## 2011-03-08 LAB — COMPREHENSIVE METABOLIC PANEL
AST: 26 U/L (ref 0–37)
Albumin: 3.9 g/dL (ref 3.5–5.2)
Alkaline Phosphatase: 57 U/L (ref 39–117)
BUN: 19 mg/dL (ref 6–23)
CO2: 27 mEq/L (ref 19–32)
Chloride: 106 mEq/L (ref 96–112)
GFR calc non Af Amer: 55 mL/min — ABNORMAL LOW (ref >60)
Potassium: 4.5 mEq/L (ref 3.5–5.1)
Total Bilirubin: 0.5 mg/dL (ref 0.3–1.2)

## 2011-03-30 ENCOUNTER — Other Ambulatory Visit: Payer: Self-pay

## 2011-03-30 MED ORDER — HYDROCODONE-ACETAMINOPHEN 5-500 MG PO TABS: 0.5000 | ORAL_TABLET | ORAL | Status: DC | PRN

## 2011-03-30 NOTE — Telephone Encounter (Signed)
[facility]

## 2011-03-30 NOTE — Telephone Encounter (Signed)
Last seen 12/2010 , last rx 11/2010 # 90 2RF  Request for rf on vicodin 5-500 Please advise

## 2011-03-30 NOTE — Telephone Encounter (Signed)
#  90 RF3

## 2011-04-13 NOTE — Op Note (Signed)
NAMEJOANETTE, SILVERIA NO.:  [id number]   MEDICAL RECORD NO.:  [id number]          PATIENT TYPE:  AMB   LOCATION:  DSC                          FACILITY:  [facility]   PHYSICIAN:  Juanetta Gosling, MDDATE OF BIRTH:  [birth year]   DATE OF PROCEDURE:  05/12/2009  DATE OF DISCHARGE:                               OPERATIVE REPORT   PREOPERATIVE DIAGNOSIS:  Bilateral mammographic abnormalities.   POSTOPERATIVE DIAGNOSIS:  Bilateral mammographic abnormalities.   OPERATION PERFORMED:  Bilateral breast wire localization biopsies.   SURGEON:  Juanetta Gosling, MD   ASSISTANT:  None.   ANESTHESIA:  General.   ANESTHESIOLOGIST:  Zenon Mayo, MD   SPECIMENS:  Bilateral breast masses to pathology.   ESTIMATED BLOOD LOSS:  Minimal.   COMPLICATIONS:  None.   DISPOSITION:  To recovery room in stable condition.   INDICATIONS FOR PROCEDURE:  Ms. Pointer is a 70 year old female who  underwent routine screening mammography in March.  She returned and  underwent magnification views of both sides of these calcifications.  She underwent a right-sided biopsy done by Dr. 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  She was then referred for evaluation for a bilateral breast  wire localization biopsy given the findings on her core as well as her  mammographic findings.  She had no palpable masses on either side on her  breasts.   DESCRIPTION OF PROCEDURE:  After informed consent was obtained, she  first went to [facility] and had bilateral wires placed by  Dr. Samuella Cota.  He and I discussed the placement of wires prior to her  having these wires placed.  She was then brought to [facility].  She was then administered 1 g of intravenous cefazolin as well as she  had sequential compression devices placed on her lower extremities prior  to operation.  She was then placed under general anesthesia  without  complication.  Her bilateral breasts were then prepped and draped in  standard sterile surgical fashion.  She was prepped with ChloraPrep.  Surgical time out was then performed.   I had her mammograms for review during the entire operation.  I  approached the right breast first.  A curvilinear incision was made in  the right breast.  The wire was then introduced into the wound.  I  removed the wire in the area and the area posterior to this where her  calcifications were with electrocautery.  There was hemostasis observed  following this.  I performed a mammogram in the operating room which  appeared to have a clip as well as the calcifications.  This was then  confirmed by Dr. Samuella Cota at [facility].  I then closed the  dermis with a 3-0 Vicryl, the skin with a 4-0 Monocryl in a subcuticular  fashion.  Steri-Strips and a sterile dressing were placed.   An entirely different set up was then used to approach the left breast.  A similar curvilinear incision was then made to the left breast.  Dissection  was carried out down to the level of the wire which was then  brought into the wound.  The area in question was removed with  electrocautery.  Hemostasis was observed.  This breast mass was also  marked.  A mammogram in the operating room showed confirmation of  removal of the clip as well as the calcifications.  This was also  confirmed by Dr. Tomie China.  Following this, I then closed the dermis  with a 3-0 Vicryl and the skin with a 4-0 Monocryl in subcuticular  fashion.  10 mL of 0.25% Marcaine were infiltrated into both sides.  Steri-Strips and sterile dressing were placed on the left side.  She  tolerated this well, was extubated in the operating room and transferred  to recovery room in stable condition.      Juanetta Gosling, MD  Electronically Signed     MCW/MEDQ  D:  05/12/2009  T:  05/12/2009  Job:  [id number]   cc:   Dr. Jalene Mullet, MD  [address] [place]  [place] [address]   Jesse Sans Wall, MD, FACC  [address]  [address]  [place]  [place] [address]

## 2011-04-13 NOTE — Consult Note (Signed)
NEW PATIENT CONSULTATION   Mooney, Alexandra C  DOB:  Apr 13, 1941                                       05/26/2010  ZOXWR#:[id number]   DATE OF SURGERY:  08/19/2004, consisting of left subclavian bypass with  6 mm Dacron graft.   Patient returns in follow-up for her previous surgeries.  She has had a  very complex peripheral vascular disease consisting of 1 aorta and  aortobifemoral bypass graft on 10/10/2003, left common carotid to  subclavian artery bypass 08/19/2004, right femoral exploration with  thrombectomy of right femoral PTFE bypass to above-knee popliteal artery  bypass in 2002, right common femoral endarterectomy with reimplantation  of right femoral popliteal bypass, 10/19/2001.  Redo right femoral  popliteal bypass grafting with 6 mm stretch Gore-Tex graft on 02/082002  and right femoral-popliteal bypass graft on 05/18/1999.  Patient has  been followed with ABIs and evaluation of her carotids on a yearly  basis.  She has had a very limited followup with a physician.  Her last  follow-up was in 2004.  Patient returns to the clinic today with very  little complaints from her multiple surgeries.  Her most major complaint  at this time is numbness in her left arm when she is sleeping.  She  states that this has gotten worse over the past several months.   PAST MEDICAL HISTORY:  Consists of hypertension, hypercholesterolemia.   She is widowed.  She has 2 children.  She works as a Scientist, physiological.  She  has a remote history of tobacco use with discontinuation July 2001.   Family history is significant for vascular disease in her mother.   ALLERGIES:  Codeine.   CURRENT MEDICATIONS:  Felodipine 5 mg p.o. daily, Altace 5 mg p.o.  daily, Advair 250/50 b.i.d., Imdur 30 mg daily, lorazepam 0.5 mg 1 to 2  daily, Naprosyn 500 mg b.i.d., simvastatin 80 mg p.o. daily, gabapentin  300 mg 4 times daily, hydrocodone 5/500 p.r.n., chlorthalidone 25 mg  daily,  Sertraline 50 mg 1/2 tablet daily, Voltaren 1% gel b.i.d.,  fexofenadine 180 mg p.r.n. p.o. daily p.r.n., cilostazol 100 mg b.i.d.,  fish oil daily, Centrum Silver daily, Ecotrin 81 mg daily, omeprazole DR  20 mg b.i.d.   REVIEW OF SYSTEMS:  Significant for pain in the legs and feet when  walking, shortness of breath from a cardiac standpoint, GERD, diarrhea,  constipation, dizziness, arthritis, joint pain and muscle pain.   PHYSICAL FINDINGS:  Her height is 5 feet 3 inches.  Weight is 150  pounds.  She was very pleasant elderly woman in no apparent distress and  appeared well nourished.  Heart rate was 81.  Blood pressure in the  right arm was 161/54, in the left 145/66.  O2 sat was 98.  HEENT:  PERRLA.  EOMI.  Normal conjunctivae.  Lungs were clear to auscultation.  Cardiac exam revealed regular rate and rhythm.  I did not appreciate any  bruits.  Abdomen:  Soft, nontender, nondistended.  Musculoskeletal exam  demonstrated no major deformities.  Neurologic exam demonstrated no  focal weaknesses or paresthesias.  Skin exhibited no ulcers or rashes.  I appreciated no lymphadenopathy.  Attention was then turned to her left  arm.  I did several __________ pocket of the chest including Allen's  test, upper arm raising over her left arm over her head, I  placed her in  a sleeping position on her left side.  At no time was there any  diminution of the radial pulse.  Her Freida Busman test was negative.   She did undergo a carotid duplex exam, demonstrated 40% to 59%  narrowings in the bilateral internal carotid arteries.  The left common  carotid artery to subclavian artery bypass graft was patent with no  evidence of stenosis.  Elevated stent velocities were noted in the  bilateral proximal CCA's of greater than 200 cm/s with no plaque  formations noted.   Doppler velocities of the left internal carotid artery were less than  previously recorded.  The right remained stable.  The left carotid   velocity on 12/28 was 36.  Today it was 26.  She further underwent ABIs  demonstrating improvement in her ABIs with a right of 0.73 and left 0.91  in comparison to previous ABIs, right 0.66 and left 0.67.  The Doppler  wave forms were biphasic bilaterally.   We will have patient to return to see Dr. Myra Gianotti in 3 to 4 weeks for  follow-up so that he can evaluate her left arm numbness.  It is entirely  possible that she could have a component of a nerve entrapment.  She did  state at 1 time during the encounter that she did wake up several  mornings' ago, and both shoulders were none.  This would bode more  towards a neurological etiology than vascular.  At this time, she will  continue to have her follow-up with Dr. Myra Gianotti for his evaluation.   Wilmon Arms, PA   Janetta Hora. Fields, MD  Electronically Signed   KEL/MEDQ  D:  05/26/2010  T:  05/26/2010  Job:  [id number]

## 2011-04-13 NOTE — Procedures (Signed)
CAROTID DUPLEX EXAM   INDICATION:  Common carotid  to subclavian artery bypass graft.   HISTORY:  Diabetes:  No  Cardiac:  MI  Hypertension:  Yes  Smoking:  Previous  Previous Surgery:  Left common carotid to subclavian artery bypass graft  on 08/19/2004.  CV History:  The patient states occasional left arm numbness with motion  Amaurosis Fugax No, Paresthesias No, Hemiparesis No                                       RIGHT             LEFT  Brachial systolic pressure:         146               128  Brachial Doppler waveforms:         normal            normal  Vertebral direction of flow:        Antegrade         Antegrade/  atypical  DUPLEX VELOCITIES (cm/sec)  CCA peak systolic                   165               196  ECA peak systolic                   256               175  ICA peak systolic                   224               156  ICA end diastolic                   54                29  PLAQUE MORPHOLOGY:                  mixed             mixed  PLAQUE AMOUNT:                      Moderate          Moderate  PLAQUE LOCATION:                    ICA/ ECA          ICA/ECA   IMPRESSION:  1. 60 to 70% stenosis of the right internal carotid artery.  2. 40 to 59%  stenosis of the left internal carotid artery.  3. Patent left common carotid to subclavian artery bypass noted with      no evidence of stenosis.  4. Antegrade left vertebral artery flow demonstrates mild early      systolic deceleration.  5. An increase in the velocity of the right internal carotid artery is      noted when compared to the previous exam on 05/23/2008 with the      left internal carotid artery remaining stable.       ___________________________________________  P. Liliane Bade, M.D.   CH/MEDQ  D:  05/01/2009  T:  05/01/2009  Job:  [id number]

## 2011-04-13 NOTE — Procedures (Signed)
CAROTID DUPLEX EXAM   INDICATION:  Follow up carotid artery disease.   HISTORY:  Diabetes:  No.  Cardiac:  No.  Hypertension:  Yes.  Smoking:  Previous.  Previous Surgery:  Aorto-to-bifem bypass graft 10/10/03, left common  carotid to subclavian artery bypass graft on 08/19/04.  CV History:  No.  Amaurosis Fugax No, Paresthesias No, Hemiparesis No.                                       RIGHT             LEFT  Brachial systolic pressure:         141               129  Brachial Doppler waveforms:         Within normal limits                Within normal limits  Vertebral direction of flow:        Antegrade         Atypical antegrade  DUPLEX VELOCITIES (cm/sec)  CCA peak systolic                   132               235 (proximal)  ECA peak systolic                   176               163  ICA peak systolic                   228               229  ICA end diastolic                   52                36  PLAQUE MORPHOLOGY:                  Heterogenous      Heterogenous  PLAQUE AMOUNT:                      Moderate          Moderate  PLAQUE LOCATION:                    ICA, ECA          ICA, ECA   IMPRESSION:  1. 60-79% stenosis of the right internal carotid artery.  2. 40-59% stenosis of the left internal carotid artery.  3. Patent left common carotid artery-subclavian bypass graft.  4. Antegrade flow in bilateral vertebrals; however, there is atypical      antegrade flow in the left vertebral.       ___________________________________________  [name]. Hart Rochester, M.D.   CB/MEDQ  D:  11/25/2009  T:  11/25/2009  Job:  [id number]

## 2011-04-13 NOTE — Procedures (Signed)
CAROTID DUPLEX EXAM   INDICATION:  Left common carotid to subclavian artery bypass graft and  carotid disease.   HISTORY:  Diabetes:  No.  Cardiac:  No.  Hypertension:  Yes.  Smoking:  Previous.  Previous Surgery:  Left common carotid to subclavian artery bypass graft  on 08/19/2004.  CV History:  Currently asymptomatic.  Patient does state occasional left  arm numbness upon waking.  Amaurosis Fugax No, Paresthesias No, hemiparesis No.                                       RIGHT             LEFT  Brachial systolic pressure:         162               162  Brachial Doppler waveforms:         Normal            Normal  Vertebral direction of flow:        Antegrade         Atypical antegrade  DUPLEX VELOCITIES (cm/sec)  CCA peak systolic                   104 (mid)         136 (mid)  ECA peak systolic                   126               167  ICA peak systolic                   208               155  ICA end diastolic                   68                26  PLAQUE MORPHOLOGY:                  Mixed             Mixed  PLAQUE AMOUNT:                      Moderate          Moderate  PLAQUE LOCATION:                    ICA/ECA           ICA/ECA   IMPRESSION:  1. Doppler velocities suggest a 40%  to 59% stenosis of the bilateral      internal carotid arteries.  2. Patent left common carotid to subclavian artery bypass graft with      no evidence of stenosis.  3. Elevated velocities noted in the bilateral proximal common carotid      arteries of >200 cm/s with no plaque formations noted.  This is      most likely due to Doppler sample location in relation to the      aortic arch.  4. Doppler velocities of the left internal carotid artery are less      than previously recorded when compared to the previous examination      on 11/25/2009 with the right internal carotid artery remaining  stable.   ___________________________________________  Quita Skye.  Hart Rochester, M.D.   CH/MEDQ  D:  05/26/2010  T:  05/26/2010  Job:  [id number]

## 2011-04-13 NOTE — Assessment & Plan Note (Signed)
[facility]                            CARDIOLOGY OFFICE NOTE   NAME:Basto, Quinlan                        MRN:          [id number]  DATE:03/05/2008                            DOB:          1940/12/11    I was asked by Dr. [name] to reevaluate Marcha Dutton.   She is currently having no symptoms but has a significant cardiac  history with multiple risk factors.  We have not seen her in this office  in almost 4 years.   HISTORY OF PRESENT ILLNESS:  She is 70 years of age, widowed white  female, mother of two.  She works at [employer] with [employer] who  referred her to me.   She had coronary bypass grafting in July 2001.  At that time,  Dr. Evelene Croon placed a left internal mammary graft to the LAD, a vein graft to  an obtuse marginal branch of the circumflex, a sequential vein graft to  the posterior descending vessel and posterolateral branch to the right  coronary artery.  Her preoperative ejection fraction was around 50%.   She has had follow-up stress Myoviews but the last one was in 2005.  At  that time, she had an EF of 75% with no evidence of ischemia or  infarction.   Her risk factors include peripheral vascular disease and she is status  post a right femoral-popliteal bypass graft in June 2000 with a redo in  February 2002 by Dr. Liliane Bade.  He has also had a right femoral  endarterectomy with a Gore-Tex interposition graft.  She is status post  aortobifemoral bypass in 2004.   She has a history of hypertension, hyperlipidemia.  At a recent checkup,  her cholesterol was a total of 181, her triglycerides were 243, HDL was  49.70, VLDL was 49 and elevated, LDL was 89.0.  This is on 80 of  simvastatin.  The rest of her blood work was unremarkable.   PAST MEDICAL HISTORY:  In addition to the above, she has hypertension.  This has not been under good control in the office at least per my  perusal.   CURRENT MEDICATIONS:  1.  Enteric-coated aspirin 81 mg a day.  2. Fish oil 1000 mg a day.  3. Neurontin 300 mg p.o. b.i.d.  4. Naproxen 500 mg b.i.d.  5. Multivitamin daily.  6. Imdur 30 mg a day.  7. Felodipine 5 mg a day.  8. Ramipril 5 mg a day.  9. Simvastatin 80 mg q.h.s.  10.Chlorthalidone 25 mg q.h.s.   SURGICAL HISTORY:  In addition to the above, she has known true vascular  disease but has not had any surgery.  She is followed by Dr. Jake Samples  for annual ultrasounds.   She also lists a subclavian artery bypass by Dr. Madilyn Fireman August 19, 2004 which I do not have a record of in my chart.   FAMILY HISTORY:  Remarkable for coronary disease.   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  She is widowed and  has two children.   She is intolerant of CODEINE.   REVIEW OF SYSTEMS:  She has some chronic fatigue and constipation.  She  has had some chronic arthritis, anxiety and depression.  Otherwise her  review of systems are negative.   PHYSICAL EXAMINATION:  Her blood pressure is 158/74, pulse is 88  regular.  She is 5 feet 3 and weighs 156 pounds.  She says her ideal  weight is around 140.  HEENT:  Normocephalic, atraumatic.  PERRLA.  Extraocular movement is  intact.  Sclerae clear.  Facial symmetry is normal.  Carotid upstrokes  are equal bilaterally without  bruits, no JVD.  Thyroid is not enlarged.  Trachea is midline.  Carotids reveal bilateral  short bruits.  LUNGS:  Clear.  HEART:  Reveals a regular rate and rhythm without gallop.  ABDOMEN:  Soft with good bowel sounds.  She is obese.  Organomegaly  could not be adequately assessed.  EXTREMITIES:  Reveal no cyanosis or clubbing, only trace edema.  Pulses  were diminished bilaterally and barely palpable.  She had no sign of  DVT.  NEURO:  Intact.  SKIN:  Unremarkable.   Her electrocardiogram demonstrates sinus rhythm at a rate 88 beats per  minute.  She has one PVC.  She had some PACs.   I had  a long talk with Ms. Condie today.  We spent about 30 minutes  going over the importance of controlling her blood pressure which I do  not think has been well-controlled, the importance of getting her weight  back down to about 140 to decrease her mixed hyperlipidemia numbers,  regular exercise if she can tolerate it, and objective assessment of her  coronaries.  She has also been told to followup with Dr. Madilyn Fireman on a  regular basis.   PLAN:  1. I have increased her Felodipine to 10 mg a day.  2. Adenosine rest stress Myoview.  3. See [place] back on an annual basis.   We also gave her a list of low carb foods.  We also reviewed this  verbally at length.     Thomas C. Daleen Squibb, MD, [facility]  Electronically Signed    TCW/MedQ  DD: 03/05/2008  DT: 03/05/2008  Job #: [id number]   cc:   Gordy Savers, MD

## 2011-04-13 NOTE — Procedures (Signed)
CAROTID DUPLEX EXAM   INDICATION:  Followup carotid artery disease.   HISTORY:  Diabetes:  No.  Cardiac:  CABG.  Hypertension:  Yes.  Smoking:  Quit.  Previous Surgery:  Left common carotid artery to subclavian artery  bypass graft.  CV History:  No.  Amaurosis Fugax No, Paresthesias No, Hemiparesis No                                       RIGHT             LEFT  Brachial systolic pressure:         148               138  Brachial Doppler waveforms:         Biphasic          Biphasic  Vertebral direction of flow:        Antegrade         Antegrade  (abnormal)  DUPLEX VELOCITIES (cm/sec)  CCA peak systolic                   100               116  ECA peak systolic                   175               133  ICA peak systolic                   172               135  ICA end diastolic                   50                40  PLAQUE MORPHOLOGY:                  Calcified         Calcified  PLAQUE AMOUNT:                      Moderate          Moderate  PLAQUE LOCATION:                    ICA/ECA/CCA       ICA/ECA   IMPRESSION:  1. Bilateral ICA stenosis of 40-59%.  2. Patent left common carotid artery-subclavian artery bypass graft.  3. Bilateral vertebral arteries appear antegrade.  However, left      appears somewhat abnormal.   ___________________________________________  P. Liliane Bade, M.D.   AS/MEDQ  D:  05/23/2008  T:  05/23/2008  Job:  [id number]

## 2011-04-16 NOTE — Discharge Summary (Signed)
[facility]  Patient:    [name], [name]                      MRN: [id number] Adm. Date:  [id number] Disc. Date: [id number] Attending:  Melvenia Needles Dictator:   Loura Pardon, P.A. CC:         Denman George, M.D.;  Gordy Savers, M.D.;   Discharge Summary  DATE OF BIRTH:  03-20-2041  CARDIAC SURGEON:  Alleen Borne, M.D.  PRIMARY CARE PHYSICIAN:  Gordy Savers, M.D.  FINAL DIAGNOSES: 1.  Exophytic plaque at the terminal abdominal aorta extending into the right     common iliac artery, forming an 80% stenosis. 2.  A history of infrainguinal arterial occlusive disease with bilateral right     superficial femoral artery occlusion, and occlusion of the right     femoral-to-popliteal bypass which was placed in June 2000.  SECONDARY DIAGNOSES: 1.  History of atherosclerotic coronary artery disease, status post coronary     artery bypass graft surgery x 4 on June 27, 2000. 2.  Status post right femoral-to-popliteal bypass May 08, 1999. 3.  Hypertension. 4.  Current long-term history of tobacco habituation. 5.  Hypercholesterolemia. 6.  Extracranial cerebrovascular occlusive disease. 7.  Arthritis.  PROCEDURE:  August 10, 2000 - Percutaneous transluminal angioplasty of the right common iliac artery with stenting.  Postoperative ankle brachial indexes were on the right 0.47, on the left 0.67.  On August 21st, on the right, they were 0.53, on the left 0.73.  DISCHARGE DISPOSITION:  Mrs. [name] was judged a suitable candidate for discharge August 12, 2000, postprocedure day #1.  She remained afebrile in the postoperative period.  She did not experience any cardiac dysrhythmias. She did not require supplemental oxygen.  She was taking oral nourishment well and tolerating it.  She was ambulating independently.  Her mental status was clear at all times.  She did not experience any confusion, hallucinations. She was  ambulating independently.  Her catheter insertion site is free of swelling or drainage.  She goes home on the following medications: 1.  Enteric aspirin 81 mg daily. 2.  Plavix 75 mg daily. 3.  Allegra as needed. 4.  Naprosyn 500 mg b.i.d. 5.  Toprol 25 mg daily. 6.  Niferex 150 mg daily. 7.  Zocor 20 mg daily. 8.  Altace 5 mg daily. 9.  Hydrocodone 7.5/500 mg one to two tablets p.o. q.4-6h. p.r.n. pain.  DISCHARGE ACTIVITY:  Ambulation as tolerated.  DISCHARGE DIET:  Low sodium and low cholesterol diet.  WOUND CARE:  She may bathe daily, keeping incision clean and dry.  FOLLOW-UP:  She will be seen at the offices of Dr. Madilyn Fireman on Monday, August 22, 2000, at 2:50 in the afternoon.  BRIEF HISTORY:  Mrs. [name] is a 70 year old female with known history of infrainguinal arterial occlusive disease with claudication symptoms.  She had recurrent symptoms in August 2001 after undergoing coronary artery bypass graft surgery.  An arteriogram was taken at the end of August, which shows an exophytic plaque in the terminal abdominal aorta extending into the right common iliac artery, causing an 80% stenosis.  She also has a known occluded right femoral-to-popliteal bypass and bilateral superficial femoral artery occlusions.  Dr. Madilyn Fireman discussed the options of treatment with [name] and she opted for endovascular angioplasty and possible stent placement in the terminal abdominal aorta and in the bilateral iliac arteries.  HOSPITAL COURSE:  [name] was admitted to [facility] on September 13th, and she underwent percutaneous transluminal angioplasty and stenting of the bilateral common iliac arteries with good result.  Ankle brachial indexes have been dictated above.  Her postoperative course is as described and discharge disposition.  She goes home on September 14th on the medications as described above with followup with Dr. Madilyn Fireman in two weeks. DD:   08/12/00 TD:  08/15/00 Job: [id number]

## 2011-04-16 NOTE — Cardiovascular Report (Signed)
[place]. [facility]  Patient:    [name], [name]                      MRN: [id number] Proc. Date: 06/24/00 Adm. Date:  [id number] Attending:  Learta Codding CC:         Gordy Savers, M.D., phone [phone]             Cecil Cranker, M.D. [facility]                        Cardiac Catheterization  DATE OF BIRTH:  02/25/1941  PRIMARY CARE PHYSICIAN:  Gordy Savers, M.D.  CARDIOLOGIST:  Cecil Cranker, M.D.  PROCEDURES PERFORMED: 1. Selective coronary angiography. 2. Ventriculography.  DIAGNOSES: 1. Multivessel coronary artery disease. 2. Preserved left ventricular ejection fraction.  HISTORY:  Ms. Carmon is a 70 year old white female with a history of peripheral vascular disease.  She is status post aorta right femoral graft by Dr. Madilyn Fireman in June of 2000.  The patient recently experienced an episode of substernal chest pain while taking a flight in an airplane.  Since that time the patient has felt some occasional sensation of tightness and shortness of breath.  She underwent exercise treadmill testing which was markedly positive.  She has been referred for a diagnostic cardiac catheterization.  DESCRIPTION OF PROCEDURE:  After informed consent was obtained, the patient was brought to the catheterization laboratory.  The left groin was sterilely prepped and draped.  Lidocaine 1% was used to infiltrate the left groin and a 6 French arterial sheath was advanced using the modified Seldinger technique. Subsequently selective coronary angiography was performed JL4 and JR4 catheters.  Selective angiography was performed in various projections using manual injections of contrast.  After selective coronary angiography attention was turned to ventriculography.  A 6 French angled pigtail catheter was advanced into the left ventricle.  Appropriate left hemodynamics were obtained.  Subsequently, ventriculography was performed using power  injections of contrast.  At the termination of the case the pigtail catheter was then pulled back and removed.  Subsequently the catheters and sheaths were removed and manual pressure applied until adequate hemostasis was achieved.  The patient tolerated the procedure well and was transferred to the floor in stable condition.   Of note, was that due to a tortuous distal aorta wire exchanges were used for catheter placement.  FINDINGS:  HEMODYNAMICS:  Aortic pressure was 180/100 mmHg.  Left ventricular pressure was 180/18 mmHg.  VENTRICULOGRAPHY:  Ventriculography performed in the single plane RAO projection.  Significant ventricular ectopy.  On the post PVC, the patients ejection fraction was above 70%.  There were no segmental wall motion abnormalities.  SELECTIVE CORONARY ANGIOGRAPHY: 1. Left main coronary artery was moderately incised with calcification.  There    was no flow-limiting disease of the left main coronary artery. 2. Left anterior descending artery was heavily calcified in its proximal and    mid segment.  Multiple sequential lesions were noted with a more focal    lesion in the proximal segment of approximately 50-60%.  The mid vessel was    diffusely diseased with a stenosis of approximately 50%.  Prior to the    second diagonal was a 70% focal stenosis. 3. Left circumflex coronary artery was heavily calcified in its proximal    segment.  The circumflex proper was small and the circumflex gave rise to    two  obtuse marginal branches.  The first one being a large size vessel.    The proximal circumflex coronary artery has a 99% stenosis. 4. Right coronary artery:  The right coronary artery was a diffusely diseased    vessel and again heavily calcified.  In its mid segment there was a focal    90% stenosis followed by a small aneurysmal segment.  The mid vessel was    diffusely diseased with a stenosis of approximately 50-60%.  A more distal    focal stenosis prior to  the takeoff of the posterior descending artery    showed an 80% stenosis.  The coronary system was a right dominant.  CONCLUSIONS: 1. Multivessel coronary artery disease. 2. Systemic hypertension. 3. Normal left ventricular ejection fraction.  RECOMMENDATIONS:  The results were discussed with the patient and her family. Based on the complexity of the lesions as well as the fact that the patient has multivessel coronary artery disease, the decision was made to proceed with coronary artery bypass grafting.  The patient will discuss this further with cardiovascular consultants. DD:  06/24/00 TD:  06/25/00 Job: [id number]

## 2011-04-16 NOTE — Consult Note (Signed)
[place]. [facility]  Patient:    [name]                      MRN: [id number] Proc. Date: 06/24/00 Adm. Date:  [id number] Attending:  Cleatrice Burke CC:         Lewayne Bunting, M.D.; Thelma Comp, M.D.                          Consultation Report  REASON FOR CONSULTATION:  Severe 3-vessel coronary artery disease.  HISTORY OF PRESENT ILLNESS:  This patient is a 70 year old, white female smoker with multiple cardiac risk factors who reports a history of intermittent chest tightness for years.  She apparently had a nuclear stress test 3-4 years ago that was negative.  She says that more recently she has been having more frequent episodes of chest tightness relieved with rest.  On July 4th weekend she was flying in a small plane and got hot and had a smothering sensation.  She had some residual chest soreness afterwards.  She underwent a treadmill stress test which was abnormal and was referred by Dr. Amador Cunas for cardiologic evaluation.  Cardiac catheterization today showed severe 3-vessel disease, with calcified proximal coronary arteries.  The LAD had long 50-70% proximal stenosis.  The left circumflex had a hazy 99% proximal stenosis.  The right coronary artery was diffusely diseased with 90% midvessel stenosis, 50% midvessel stenosis, and then 80% distal stenosis. Left ventricular ejection fraction was greater than 60%.  PAST MEDICAL HISTORY:  Significant for hypertension and hypercholesterolemia. She has a history of atherosclerotic peripheral vascular disease, status post a right fem-pop bypass by Dr. Madilyn Fireman in June 2000 for right lower extremity claudication.  She has a history of arthritis.  She is status post tubal ligation.  REVIEW OF SYSTEMS:  Constitutional:  She denies fever or chills.  She has no weight loss or weight gain.  Eyes:  Negative.  ENT:  Negative. Cardiovascular: As above.  She has had intermittent substernal chest  tightness without radiation.  She has had no orthopnea or PND.  She denies lower extremity edema or palpitations.  Respiratory:  She denies cough or sputum production.  GI: She has had normal bowel movement.  She has had no melena or bright red blood per rectum.  GU:  She denies dysuria or hematuria.  Neurologic:  She has had no focal weakness or numbness.  She has no history of TIA or stroke.  She has had no dizziness or syncope.  Hematologic:  She has no history of bleeding disorders or easy bleeding.  ALLERGIES:  CODEINE.  Psychiatric:  Negative. Endocrine:  She denies diabetes or hypothyroidism.  MEDICATIONS PRIOR TO ADMISSION:  Cartia XT 240 mg q.d., Allegra p.r.n., Naprosyn 500 mg b.i.d., and aspirin 1 q.d.  SOCIAL HISTORY:  Significant for being separated, with two grown children. 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  She continues to smoke one pack per day.  She denies ethanol use.  FAMILY HISTORY:  Negative.  PHYSICAL EXAMINATION:  VITAL SIGNS:  She is afebrile.  Blood pressure is 160/80, pulse 90, respiratory rate 16 and unlabored.  GENERAL:  She is a well-developed white female in no distress.  HEENT:  She is normocephalic and atraumatic.  The pupils are equal and reactive to light and accommodation.  Extraocular muscles are intact.  Her throat  is clear.  NECK:  Normal carotid pulses bilaterally.  There are bilateral cervical bruits.  There is no adenopathy or thyromegaly.  CARDIAC:  Regular rate and rhythm with normal S1 and S2.  There is no murmur, rub, or gallop.  LUNGS:  Clear.  ABDOMEN:  Active bowel sounds.  The abdomen is soft, flat, and nontender. There are no palpable masses or organomegaly.  EXTREMITIES:  No peripheral edema.  There is old right fem-pop bypass incisions.  There are numerous varicose veins in the right lower leg.  Pedal pulses are weakly palpable bilaterally.  NEUROLOGIC:  She is alert and  oriented x 3.  Motor and sensory exams are grossly normal.  SKIN:  Warm and dry.  She has arthritic changes of both hands.  CAROTID DOPPLER EXAMINATION:  Mild-to-moderate calcific plaque in the common carotid arteries and internal carotid arteries bilaterally.  There is a 40-60% right internal carotid artery stenosis and a 60-80% left internal carotid artery stenosis.  Vertebral flow is antegrade bilaterally.  Palmar arches are normal bilaterally.  Her ABI on May 06, 2000 was 0.8 on the right and 0.74 on the left.  Electrocardiogram, chest x-ray, and laboratory examinations are pending.  IMPRESSION:  This patient has severe 3-vessel coronary artery disease, with crescendo antral symptoms.  I agree that coronary artery bypass graft surgery is the best treatment for her, followed by maximum cardiac risk factor reduction including smoking cessation, a dietary modification, and control of her cholesterol level.  I have discussed the operative procedure with her and her daughter including alternatives, benefits, and risks including bleeding, possible blood transfusion, infection, stroke, myocardial infarction, and death.  They understand and would like to proceed with surgery.  We will plan on doing this on Monday, June 27 2000. DD:  06/24/00 TD:  06/27/00 Job: [id number]

## 2011-04-16 NOTE — H&P (Signed)
[facility]  Patient:    [name], [name]                        MRN: [id number] Adm. Date:  08/11/00 Attending:  Denman George, M.D. Dictator:   Marlowe Kays, P.A.                         History and Physical  DATE OF BIRTH:  1941/08/07  CHIEF COMPLAINT:  AIOD.  ATTENDING PHYSICIAN:  Denman George, M.D.  HISTORY OF PRESENT ILLNESS:  Ms. ______ is a pleasant 70 year old white female with known history of PVOD with claudication, who is status post CABG. After the procedure, the patient did reasonably well from a cardiac standpoint.  However, around August, the patient experienced recurrent symptoms of claudication in the right lower extremity. An arteriogram at the end of August showed exophytic plaque in the terminal abdominal aorta extending into the right CIA origin, which causes about 80% stenosis.  Also, she has an occluded right fem-pop bypass graft and bilateral superficial femoral artery occlusions.  After discussing the options of treatment with Dr. Madilyn Fireman, the patient opted for endovascular angioplasty and possible stent in the terminal aorta and bilateral iliac vessels scheduled for August 11, 2000.  The last ABIs showed right index of 0.53, and a left index of 0.73.  ______  prior to surgery, she denies any breast pain or night pain. However, on ambulation, she complains of right buttock pain, right hip pain, right leg pain and right foot pain.  She denies any nonhealing ulcers or soft tissue loss.  She has some mild left peripheral edema, post CABG.  No gangrene.  No shortness of breath or dyspnea on exertion after walking.  No paroxysmal nocturnal dyspnea or orthopnea.  No chest pain or palpitations.  PAST MEDICAL HISTORY: 1. PVOD - claudication. 2. Hypertension. 3. CAD status post CABG. 4. History of recent tobacco abuse. 5. Carotid artery disease. 6. Osteoarthritis. 7. Hypercholesterolemia.  SURGERIES: 1. Status  post CABG x 4.  Dr. Laneta Simmers June 27, 2000. 2. Status post right fem-pop bypass graft with Gore-Tex on June 9. 2000.  MEDICATIONS:  Allegra p.r.n., naproxen ______ mg b.i.d., aspirin 325 mg q.d, Toprol XL 25 mg q.d., Niferex 150 mg q.d., Zocor 20 mg q.d., Altace 5 mg q.d., hydrocodone 7.5/500 one or two p.o. q.4-6h. p.r.n. for pain.  ALLERGIES:  CODEINE (GI upset).  The patient states, however that she is tolerating hydrocodone well.  REVIEW OF SYSTEMS:  See HPI and past medical history for significant positives.  The rest of her review of systems is essentially negative.  FAMILY HISTORY:  Strong for diabetes, and one brother with a history of heart disease.  SOCIAL HISTORY:  The patient is separated.  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  She quit one pack a day of tobacco use for the last 38 years.  She denies any alcohol intake.  PHYSICAL EXAMINATION:  GENERAL:  Well-developed, well-nourished 70 year old white female in no acute distress.  Alert and oriented x 3, who looks younger than her stated age.  VITAL SIGNS:  Blood pressure 150/80 on the left, 166/80 on the right.  Pulse 60, respirations 16.  HEAD:  Normocephalic, atraumatic.  PERRLA, EOMI.  Funduscopic examination within normal limits.  She wears glasses.  NECK:  Supple, no JVD.  Bilateral bruits ______ heard,  more pronounced on the right than on the left.  No thyromegaly or lymphadenopathy.  CHEST:  Symmetrical on inspirations, lungs clear to auscultation bilaterally.  CARDIOVASCULAR:  Regular rate and rhythm, 1/6 systolic murmur in the right sternal border.  There is a split S2.  A questionable S4, no rubs.  ABDOMEN:  Soft, nontender.  Bowel sounds x 4.  No hepatosplenomegaly, masses palpable or abdominal bruits.  GU/RECTAL:  Deferred.  EXTREMITIES:  No clubbing.  There is a right plantar aspect of the foot cyanosis when the patient has her legs in hung  position.  She has 1+ edema in the left lower extremity, with a well-healed venectomy site.  SKIN:  Reveals no ulcerations.  Normal hair pattern.  There is one area of callous formation in the right plantar aspect of the foot.  PERIPHERAL PULSES:  Carotid 2+ bilaterally with a soft bruit bilaterally. Femoral 2+ bilaterally with 1/4 bruit on the right.  Popliteal dorsalis pedis and posterior tibialis nonpalpable bilaterally.  NEURO:  Nonfocal, normal gait.  DTRs 2+ bilaterally.  Muscle strength 5/5.  ASSESSMENT/PLAN:  A 70 year old white female with a history of PVOD/claudication symptoms, who will undergo bilateral iliac PTA-stent by Dr. Madilyn Fireman.  Dr. Madilyn Fireman has evaluated this patient prior to the admission and has explained the risks and benefits involved in the procedure and the patient has agreed to continue. DD:  08/10/00 TD:  08/10/00 Job: [id number]

## 2011-04-16 NOTE — Op Note (Signed)
[place]. [facility]  Patient:    [name], [name] Visit Number: [id number] MRN: [id number]          Service Type: SUR Location: 3700 [id number] [id number] Attending Physician:  Melvenia Needles Dictated by:   Caralee Ates, M.D. Proc. Date: 10/20/01 Admit Date:  10/19/2001   CC:         CVTS office   Operative Report  PREOPERATIVE DIAGNOSIS:  Thrombosed right femoral interposition and right femoral popliteal bypass graft.  POSTOPERATIVE DIAGNOSIS:  Thrombosed right femoral interposition and right femoral popliteal bypass graft.  PROCEDURE: 1. Right femoral exploration, thrombectomy of right femoral interposition PTFE    bypass and right femoral to above knee popliteal artery PTFE bypass graft. 2. Completion of arteriogram.  SURGEON:  Caralee Ates, M.D.  ASSISTANT:  Loura Pardon, P.A.  ANESTHESIA:  General endotracheal.  ESTIMATED BLOOD LOSS:  25 cc.  DRAINS:  None.  SPECIMENS:  None.  COMPLICATIONS:  None.  BRIEF HISTORY:  The patient is a 70 year old white female who had previously undergone a right femoral to above knee popliteal artery bypass with PTFE for severe claudication.  Subsequently, she has developed severe diffuse calcific narrowing of her right femoral artery, and underwent right femoral endarterectomy yesterday.  The degree of atheromatous plaque in the femoral artery was so severe that it required resection of the femoral artery and interposition grafting from the external iliac to the bifurcation of the distal common femoral artery with interposition PTFE graft.  The PTFE above femoral popliteal bypass graft was then reimplanted to the interposition segment.  Postoperatively, she did fairly well.  However, earlier today on postoperative day #1, she developed numbness in the right foot as well as some pain and "heaviness" with ambulation, and on her ankle brachial indices, was found to have an ABI of 0.3 which is diminished from a  preoperative ABI of 0.7.  Duplex scan of the interposition graft failed to demonstrate any flow in the femoral bypass.  She was taken to the operating room for thrombectomy and possible revision.  DESCRIPTION OF PROCEDURE:  The patient was brought to the operating room and placed on the operating table in the supine position.  Following adequate general endotracheal anesthesia, the right lower extremity was draped and prepped circumferentially from the umbilicus to the foot.  The vertical groin incision on the right was reopened and the soft tissue closure was also opened down to the level of the interposition graft and PTFE femoral popliteal. There was no flow present in either bypass segment.  The patient was systemically heparinized.  Following an adequate three minute circulation time, a clamp was placed on the distal external iliac where there was palpable pulse, and the anastomosis of the femoral popliteal bypass to the interposition graft was taken down.  There was a _________ thrombus present in both grafts.  First, the interposition graft was thrombectomized proximally with a return of excellent in flow.  Distally, the SFA was occluded, and a thrombectomy catheter was passed down the profunda for approximately 25 cm.  There was return of a small amount of clot with excellent back bleeding.  The profunda was then instilled with heparin and saline solution.  Next, the femoral popliteal bypass graft was thrombectomized, and the Fogarty catheter was passed throughout its 60 cm length.  A large amount of organized thrombus was returned.  There was also moderate back bleeding from the bypass graft itself.  Heparin and saline solution were then instilled.  The femoral popliteal was reanastomosed in an end-to-side fashion to the interposition graft with a running 6-0 PTFE suture.  At this point, the proximal clamp was released, and there was markedly improved posterior tibial and  dorsalis pedis flow at the level of the ankle and the right foot that was _________.  At this point, a completion angiogram was performed which demonstrated some distal popliteal narrowing, however, excellent flow throughout the popliteal system, and down to the level of the foot to the AT and posterior tibial.  At this point, the butterfly needle that was used to perform the arteriogram was removed, and the cannulation site repaired with a 6-0 Prolene suture.  At this point, the wound was irrigated copiously with warm sterile saline and heparin was not reversed.  Hemostasis was then achieved and the wound was closed in layers with 2-0 and 3-0 Vicryl sutures followed by skin closure with skin staples.  Sterile dry dressings were applied.  The patient was then awakened from anesthesia and transferred to the recovery room in stable condition.  The patient tolerated the procedure well.  There were no complications.  All needle and sponge counts were correct. Dictated by:   Caralee Ates, M.D. Attending Physician:  Melvenia Needles DD:  10/20/01 TD:  10/23/01 Job: [id number]

## 2011-04-16 NOTE — Discharge Summary (Signed)
[place]. [facility]  Patient:    [name], [name] Visit Number: [id number] MRN: [id number]          Service Type: SUR Location: 2000 2040 01 Attending Physician:  Melvenia Needles Dictated by:   Dominica Severin, P.A. Admit Date:  05/15/2002 Discharge Date: 05/21/2002   CC:         CVTS Office  Gordy Savers, M.D. [facility]   Discharge Summary  DATE OF BIRTH:  12-May-1941  PRIMARY ADMISSION DIAGNOSIS:  Occluded right femoral popliteal bypass graft.  SECONDARY/PAST MEDICAL HISTORY: 1. Coronary artery disease status post coronary artery bypass graft. 2. Hypertension. 3. Congestive heart failure. 4. Hypercholesterolemia. 5. Osteoarthritis. 6. History of nicotine abuse. 7. History of thrombosed right femoral popliteal bypass graft in November    2002. 8. In June 2000 a right femoral popliteal graft and a redo in February 2002    with a right femoral endarterectomy and right femoral artery    interposition on October 18, 2001.  NEW DIAGNOSES/DISCHARGE DIAGNOSES: 1. Occluded right femoral popliteal bypass graft. 2. Coronary artery disease status post coronary artery bypass graft. 3. Hypertension. 4. Congestive heart failure. 5. Hypercholesterolemia. 6. Osteoarthritis. 7. History of nicotine abuse. 8. History of thrombosed right femoral popliteal bypass graft in November    2002. 9. In June 2000 a right femoral popliteal graft and a redo in February 2002    with a right femoral endarterectomy and right femoral artery    interposition on October 18, 2001.  PROCEDURES: 1. Lower extremity arteriogram with thrombolysis done on May 15, 2002. 2. Follow up right lower extremity arteriogram following infusion done on    May 16, 2002.  HOSPITAL COURSE:  This patient was admitted on May 15, 2002 after complaints of a cold foot.  She was seen by Dr. Madilyn Fireman and is status post right femoral popliteal by post grafting in November 2002.  It was  determined that the patient would benefit from an arteriogram and thrombosis by radiology.  She underwent the procedures stated above on May 15, 2002.  She tolerated the procedure well.  Her graft was open and it was indicated on her follow up arteriogram on May 16, 2002.  Nevertheless the patient remained stable.  She continued on heparin and was started on Coumadin for further anticoagulation. She remained stable during her hospital stay and did not have any cardiac or pulmonary complications.  She was tolerating a regular diet.  Her groins were stable from both her arteriograms.  She had positive Dopplerable right and left pedal pulses.  She was continued on heparin per pharmacy and she was later discharged on May 21, 2002 when her INR was therapeutic.  Her graft was patent.  CONDITION ON DISCHARGE:  Stable.  DISCHARGE MEDICATIONS: 1. Coumadin 5 mg tablets to take as directed by Dr. Gordy Savers. 2. Aspirin 81 mg daily. 3. Altace 10 mg daily. 4. Zocor 40 mg daily. 5. Toprol XL 50 mg daily.  ACTIVITY:  The patient is to avoid any strenuous activity.  She is to keep her right leg elevated as much as possible.  FOLLOW-UP:  She is to have her blood work drawn by Dr. Leanora Ivanoff office on Wednesday, May 23, 2002 to have her Coumadin level checked.  She is to see Dr. Madilyn Fireman on Monday, June 04, 2002 at 9:20 a.m. Dictated by:   Dominica Severin, P.A. Attending Physician:  Melvenia Needles DD:  06/08/02 TD:  06/12/02 Job: [id number]

## 2011-04-16 NOTE — H&P (Signed)
NAME:  [name], [name]                         ACCOUNT NO.:  [id number]   MEDICAL RECORD NO.:  [id number]                   PATIENT TYPE:  INP   LOCATION:  NA                                   FACILITY:  [facility]   PHYSICIAN:  Jerold Coombe, P.A.            DATE OF BIRTH:  03/02/1941   DATE OF ADMISSION:  DATE OF DISCHARGE:                                HISTORY & PHYSICAL   PRIMARY CARE PHYSICIAN:  Dr. Amador Cunas   CARDIOLOGIST:  Dr. Andee Lineman   CHIEF COMPLAINT:  Aortoiliac occlusive disease.   HISTORY OF PRESENT ILLNESS:  [name] is a 70 year old Caucasian female,  who is well known to Dr. Madilyn Fireman, who is here at the [facility] office today for  history and physical for aortofemoral bypass graft.  Apparently she has been  followed at least yearly by Dr. Madilyn Fireman for ABI studies since 2000, and Dr.  Madilyn Fireman did perform a right femoral popliteal bypass graft in June of 2000,  which failed and required a re-do in February of 2002 and a right femoral  endarterectomy again in November of 2002.  ABIs had remained relatively  stable but she presented on September 20, 2003, with increasing right lower  extremity pain.  An ABI was done showing a significant reduction in the  right ABI down to 0.21 from a previous value of 0.65 from June of 2004.  Her  left leg ABI, however, remains fairly stable at 0.57 compared to 0.63.  When  she was initially evaluated by Dr. Madilyn Fireman, she was experiencing right lower  extremity claudication symptoms, however, over the past few weeks she has  now developed rest pain symptoms.  She currently denies lower extremity  numbness or tingling, however, she says her lower extremities do feel weak  but primarily with activity.  An arteriogram was done at [facility]  on October 02, 2003, which did confirm that she had a severely calcified  aortic iliac segment with no flow limiting aortic occlusive disease,  bilateral right external iliac and bilateral superficial  femoral artery  occlusion.  Based on these findings, Dr. Madilyn Fireman believes Ms. Moncur would  benefit from aortofemoral bypass graft.  He did discuss the risks and  benefits of this procedure and she did agree to proceed.  The surgery is  scheduled for October 10, 2003.  Prior to surgery, she did get cardiac  clearance by Dr. Jens Som.   PAST MEDICAL HISTORY:  1. Peripheral vascular occlusive disease.  2. Hypertension.  3. Congestive heart failure.  4. Hypercholesterolemia.  5. Osteoarthritis.  6. Coronary artery disease.  7. History of thrombosed right femoral popliteal bypass graft in 2002.   PAST SURGICAL HISTORY:  1. Right femoral popliteal bypass graft in June of 2000, a re-do in February     of 2002 with a right femoral endarterectomy and right femoral arterial     interposition on October 18, 2001.  2. CABG x4 vessels by Dr. Laneta Simmers, this was done on June 27, 2000.   MEDICATIONS:  1. Toprol XL 100 mg one p.o. every day.  2. Altace 10 mg one p.o. every day.  3. Ecotrin aspirin 81 mg one p.o. every day.  4. Hydrochlorothiazide 25 mg one p.o. every day.  5. Zocor 40 mg one p.o. every day.  6. Neurontin 300 mg p.o. b.i.d.  7. Naprosyn 500 mg one p.o. b.i.d.  8. Centrum Silver multivitamin one p.o. every day.  9. Lorazepam 0.5 mg one p.o. b.i.d. p.r.n.   ALLERGIES:  She does have an allergy to CODEINE with the primary reaction  being nausea.   REVIEW OF SYSTEMS:  See HPI for significant positives.  In addition, she  denies history of diabetes mellitus, kidney disease, and asthma.  Specifically, she states she does have nail brittleness, arthritis with  joint pain, swelling and stiffness, heat and cold intolerance, mild dyspnea  on exertion, and occasional left shoulder numbness.   FAMILY HISTORY:  Her father and mother are both deceased.  Both suffered  from heart disease, arthritis, and diabetes mellitus.  Her father also had a  history of a CVA.  Her brothers and sisters  have a history of arthritis and  diabetes.   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  She denies alcohol use  and current tobacco use, however, she did smoke one and a half packs per day  x40 years but did quit in July of 2001.   PHYSICAL EXAMINATION:  VITAL SIGNS:  Her blood pressure is 120/64 in the  right upper extremity, her pulse is 64, her respirations are 20.  GENERAL:  This is a 70 year old Caucasian female in no acute distress.  She  is alert and cooperative.  HEENT:  Her head is normocephalic and  atraumatic.  Pupils equal, round,  reactive to light and accommodation.  No evidence of cataracts was noted.  NECK:  Supple with 2+ carotid pulses.  No bruit was auscultated on the left  side, however, a soft faint bruit was noted on the right side.  CHEST:  Symmetrical on inspiration.  LUNGS:  Her lung sounds were clear throughout.  CARDIAC:  Her heart rate had a regular rate and rhythm, S1 and S2 were  auscultated.  No murmur, rub or gallop was appreciated.  ABDOMEN:  Her abdomen is soft, nontender, nondistended with normal active  bowel sounds x4 quadrants.  No masses or bruits were noted.  There was no  hepatosplenomegaly.  GU/RECTAL:  This exam was deferred.  EXTREMITIES:  Her extremities were without edema, clubbing, cyanosis, or  ulcerations.  All extremities were warm. however, her toes were noted to be  cool bilaterally.  Capillary refill was brisk.  Lower extremity varicosities  were noted with more prominence on the right lower extremity than the left.  Dorsalis pedis and posterior pulses were not palpable as well as the right  femoral pulse.  She did have a barely palpable pulse on her left femoral.  Her radial pulses were 2+ on the right and 1+ on the left.  NEURO:  Neurological exam was grossly intact.  Her gait was steady.  She was alert and oriented x3.  Her bilateral upper  extremity and bilateral lower  extremity muscle strength was 5/5.   IMPRESSION:  This is a 70 year old Caucasian female with a history of a  right femoral popliteal bypass graft who now presents with symptoms of  occlusion.    PLAN:  She is scheduled for aortofemoral bypass graft per Dr. Madilyn Fireman on  October 10, 2003.  Dr. Madilyn Fireman has seen and evaluated the patient prior to  this admission and has explained the risks and benefits of the procedure,  and the patient has agreed to continue.                                                Jerold Coombe, P.A.    AWZ/MEDQ  D:  10/08/2003  T:  10/08/2003  Job:  [id number]

## 2011-04-16 NOTE — Discharge Summary (Signed)
[place]. [facility]  Patient:    [name], [name] Visit Number: [id number] MRN: [id number]          Service Type: SUR Location: [id number] Attending Physician:  Melvenia Needles Dictated by:   Tollie Pizza Collins, P.A.-C. Admit Date:  10/19/2001 Disc. Date: 10/24/01   CC:         Gordy Savers, M.D. [facility]   Discharge Summary  PRIMARY ADMISSION DIAGNOSES: 1. Progressive right common femoral artery stenosis. 2. Peripheral vascular disease, status post right femoral-popliteal bypass    graft in June of 2000, with redo in February of 2002.  SECONDARY DIAGNOSES: 1. Coronary artery disease status post coronary artery bypass graft. 2. Hypertension. 3. History of congestive heart failure. 4. Hypercholesterolemia. 5. Osteoarthritis. 6. History of tobacco abuse. 7. Thrombosed right femoral-popliteal bypass graft.  PROCEDURES PERFORMED: 1. Right femoral endarterectomy with right common femoral artery    interposition 6 mm Gore-Tex graft and reimplantation of right femoral-    popliteal bypass graft. 2. Right femoral exploration with thrombectomy of right interposition    PTFE and right femoral to above knee popliteal bypass graft with    intraoperative arteriogram.  HISTORY OF PRESENT ILLNESS:  The patient is a 70 year old black female who is status post previous right femoral-popliteal bypass graft in June of 2000, which required redo in February of 2002.  On routine follow-up Dopplers recently in CVTS office, she was found to have a decreased ankle brachial index on the right which was found to be 0.79.  There was stenosis at the common femoral artery noted.  It was recommended at that time that she proceed with right common femoral endarterectomy to prevent failure of the graft.  HOSPITAL COURSE:  She was admitted on October 19, 2001, and taken to the operating room where she underwent femoral endarterectomy with interposition graft to the  common femoral artery and reimplantation of the femoral to popliteal bypass graft.  She did well initially postoperatively; however, she was noted on her postoperative ankle brachial indices to have significant decrease from her preop ABIs, now found to be 0.35 on the right.  Over the course of postop day #2, she noted increasing pain in her foot and leg particularly with ambulation but later with minimal exertion.  It was felt that she had thrombosed her graft and was recommended that she proceed to the operating room for further evaluation.  She underwent a right femoral exploration and subsequent thrombectomy of the interposition graft as well as femoral to above knee popliteal bypass graft.  A postoperative arteriogram showed no evidence of remaining clot. She had strong Doppler signals postoperatively.  Since that time she has done well. She has continued to have strong Doppler signals and graft pulse.  She has been ambulating without difficulty.  She was initially started on heparin and Coumadin and by postop day #3, this was discontinued and she was started on aspirin therapy alone. She has otherwise done well and it is felt that if she continues to remain stable, she will be ready for discharge on October 24, 2001.  DISCHARGE MEDICATIONS: 1. Enteric coated aspirin 325 mg q.d. 2. Darvocet-N 100 one to two q.4h. p.r.n. for pain. 3. Toprol XL 50 mg q.d. 4. Zocor 40 mg q.d. 5. Altace 10 mg q.d.  FOLLOW-UP:  She is to follow up in the CVTS office in one week for staple removal and then with P. Bud Face, M.D., in three weeks with follow-up ABIs.  DISCHARGE INSTRUCTIONS:  She is to refrain from any driving, heavy lifting, or strenuous activity.  She should continue a low fat, low sodium diet.  She is asked to clean her incisions daily with soap and water until she sees the nurse in the office in one week for staple removal.  She is asked to call if she experiences any problems  or difficulty in the interim. Dictated by:   Tollie Pizza Collins, P.A.-C. Attending Physician:  Melvenia Needles DD:  10/23/01 TD:  10/23/01 Job: [id number]

## 2011-04-16 NOTE — Discharge Summary (Signed)
NAMEENNA, WARWICK NO.:  [id number]   MEDICAL RECORD NO.:  [id number]          PATIENT TYPE:  INP   LOCATION:  2040                         FACILITY:  [facility]   PHYSICIAN:  Balinda Quails, M.D.    DATE OF BIRTH:  1941/10/24   DATE OF ADMISSION:  08/19/2004  DATE OF DISCHARGE:  08/21/2004                                 DISCHARGE SUMMARY   ADMISSION DIAGNOSIS:  Left subclavian artery occlusion.   PAST MEDICAL HISTORY:  1.  Severe peripheral vascular occlusive disease status post right femoral      popliteal bypass June 2000 with re-do of the graft February 2002      followed by right femoral endarterectomy with Gore-Tex interposition      graft November 2002. Status post aortobifemoral bypass 2004.  All of      these surgeries by Dr. Denman George.  2.  Coronary artery disease, status post CABG 2001 by Dr. Evelene Croon.  3.  Hypertension.  4.  Dyslipidemia.   This patient is INTOLERANT of CODEINE it causes her nausea.   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   BRIEF HISTORY:  Ms. Boileau is a 70 year old Caucasian female.  She returned  to the CVTS office and Dr. Madilyn Fireman in June 2005 for continued follow up of her  peripheral vascular disease.  At that time, she was noted to have recovered  completely from her aortobifemoral bypass in November 2004 and had returned  to work.  Dr. Madilyn Fireman felt she was doing fairly well from a peripheral  vascular point of view.  He noted that she did have some probable left  subclavian disease and noted that she has a left mammary to LAD bypass but  had no chest pain or other symptoms associated with this at that time.  Since June, she has developed symptoms of fatigue in her left arm as well as  some symptoms of mild angina.  She returned to Dr. Madilyn Fireman who recommended  proceeding with arteriogram.  This was carried out and revealed a left  subclavian artery  occlusion.  Dr. Madilyn Fireman recommended proceeding with left  carotid to subclavian bypass for relief of her symptoms and preservation of  her coronary bypass graft.  The procedure risks and benefits were discussed  with Ms. Hascall and she agreed to proceed with surgery.   HOSPITAL COURSE:  On August 19, 2004, Ms. Galasso was electively admitted  to [facility] in the care of Dr. Denman George.  She underwent  the following surgical procedure.  Left carotid to subclavian bypass with a  6 mm Hemashield graft.  She tolerated the procedure well transferring in  stable condition to the PACU.  She was extubated immediately following  surgery.  She awoke from the procedure neurologically intact.  She remained  hemodynamically stable in the immediate postoperative period.   Ms. Quattrone postoperative course has been uneventful.  On morning rounds  on August 21, 2004 on postoperative day #2, Ms. Hemler reports feeling  very well.  Her  vital signs remained stable with blood pressure 125/53.  She  was afebrile.  Her room air saturation was 91%.  Her heart has maintained a  normal sinus rhythm at 81 beats per minute.  Her lungs are clear to  auscultation.  Her abdominal exam is benign.  She is tolerating small  amounts of food.  She is voiding without difficulty since removal of her  Foley catheter.  She is ambulating independently in her room.  Her pain is  well controlled.  Her left neck incision is healing well.  Steri-Strips are  intact.  There is no swelling.  She does have adequate pain control with  oral medications.  Does report that she did not sleep well last night in the  hospital.  Ms. Hornung continues to make good progress in recovery from her  surgery and is ready for discharge home this morning, August 21, 2004.   LABORATORY STUDIES:  September 22nd CBC:  White blood cells 8.5, hemoglobin  10.4, hematocrit 29.9, platelets 181.  Chemistries include a sodium 139,   potassium 4.3, BUN 17, creatinine 1.1, glucose 106.   CONDITION ON DISCHARGE:  Improved.   INSTRUCTIONS ON DISCHARGE:  1.  Medications:  She is to resume her home medicines of Toprol-XL 100 mg      daily, HCTZ 25 mg daily, Altace 10 mg daily, Vytorin 10/50 mg daily,      Naprosyn 500 mg b.i.d. p.r.n., aspirin 325 mg daily, fish oil capsules      daily.  2.  For pain management she may have Tylox one to two p.o. q.4 h. p.r.n. or      Tylenol 325 mg one to two p.o. q.4 h. for mild pain.  3.  Activity:  She has been asked to refrain from any driving or heavy      lifting for the next 2 weeks.  4.  Her diet should continue to be a heart healthy diet.  5.  Wound care:  She is to clean her incision daily.  She may shower      beginning Saturday, September 24th.  6.  Follow up:  Dr. Madilyn Fireman would like to see her back at the CVTS office on      Monday, October 10th, at 12 noon.       CTK/MEDQ  D:  08/21/2004  T:  08/22/2004  Job:  [id number]   cc:   Gordy Savers, M.D. [facility]   Learta Codding, M.D. [facility]

## 2011-04-16 NOTE — Discharge Summary (Signed)
[place]. [facility]  Patient:    TARRAH, FURUTA                      MRN: [id number] Adm. Date:  [id number] Disc. Date: [id number] Attending:  Cleatrice Burke CC:         Gordy Savers, M.D.             Lewayne Bunting, M.D.             CVTS Office                           Discharge Summary  DATE OF BIRTH:  16-May-1941  DATE OF SURGERY:  June 27, 2000  ADMISSION DIAGNOSES: 1. Three vessel coronary artery disease, cardiac catheterization on June 23, 2000. 2. Hypertension. 3. Hypercholesterolemia. 4. Arteriosclerotic peripheral vascular disease, status post right    femoral-popliteal bypass. 5. Tobacco use. 6. Arthritis.  DISCHARGE DIAGNOSES: 1. Three vessel coronary artery disease, status post coronary artery bypass    grafting x 4 on June 27, 2000, by Dr. Laneta Simmers. 2. Hypertension. 3. Hypercholesterolemia, with Zocor initiated during this hospitalization. 4. Tobacco use, Habitrol patch initiated on June 25, 2000. 5. Urinary tract infection, Cipro initiated on June 26, 2000.  She will    continue this for 10 days. 6. Arthritis.  She is being treated with Naprosyn. 7. Arteriosclerotic peripheral vascular disease. 8. Postoperative anemia.  She is being treated with iron supplementation.  ALLERGIES:  CODEINE.  HOSPITAL COURSE AND PROCEDURES:  On June 23, 2000, Ms. Moret underwent cardiac catheterization which revealed three vessel coronary artery disease with ejection fraction of greater than 60%.  Dr. Laneta Simmers was consulted and agreed that coronary artery bypass grafting was the best treatment for this patient.  On July 27, preoperative Doppler studies revealed no significant carotid artery disease.  ABIs revealed 0.80 on the right, 0.74 on the left, with a patent right femoral-popliteal graft.  On June 27, 2000, Ms. Kubota underwent coronary artery bypass grafting x 4 by Dr. Laneta Simmers.  Grafts placed at the time of surgery were left  internal mammary artery to the left anterior descending artery, saphenous vein was placed in a sequential fashion to the PD and PL, saphenous vein was placed to the OM.  At the completion of the procedure, she was transferred in stable condition to the SICU.  Ms. Langbehn postoperative course has been uneventful.  She has made daily progress in her activity.  Her respiratory status has remained satisfactory despite her two-pack-per-day smoking history.  Her postoperative course has been notable only for mild anemia, which is being treated with iron supplementation.  If the patient remains stable overnight, Dr. Laneta Simmers anticipates discharge tomorrow, July 02, 2000.  DISCHARGE MEDICATIONS: 1. Enteric-coated aspirin 1 q.d. 2. Tylox 1-2 p.o. q.4-6h. p.r.n. for pain. 3. Toprol XL 25 mg 1 q.d. 4. Niferex 150 mg 1 q.p.m. with meal. 5. Multivitamin with zinc 1 q.d. 6. Zocor 20 mg 1 q.d. 7. Naprosyn 500 mg 2 b.i.d. with meals. 8. Habitrol patch, she is to change it daily. 9. Cipro 250 mg b.i.d. x 3 days.  FOLLOW-UP:  Ms. Cavey has been instructed to follow up with her cardiologist, Dr. Andee Lineman on July 13, 2000, in his office and obtain a chest x-ray at that time.  Also, to follow up with Dr. Laneta Simmers in his office on July 19, 2000. DD:  07/01/00 TD:  07/04/00 Job: [id number]

## 2011-04-16 NOTE — Cardiovascular Report (Signed)
[place]. [facility]  Patient:    [name], [name] Visit Number: [id number] MRN: [id number]          Service Type: DSU Location: [facility] [id number] Attending Physician:  Melvenia Needles Dictated by:   Denman George, M.D. Proc. Date: 09/29/01 Admit Date:  09/29/2001 Discharge Date: 09/29/2001   CC:         Gordy Savers, M.D., [facility]  [place] Peripheral Catheterization Lab   Cardiac Catheterization  DIAGNOSIS:  Vein graft stenosis, right femoral-popliteal bypass.  PROCEDURE:  Mid abdominal aortogram with bilateral lower extremity runoff arteriography.  SURGEON:  Denman George, M.D.  ACCESS:  Left common femoral artery, 5-French sheath.  COMPLICATIONS:  None apparent.  CONTRAST: 155 ml Visipaque.  CLINICAL NOTE:  This is a 70 year old female with a history of aggressive peripheral vascular disease.  She is status post redo right femoral-popliteal bypass.  She has also previously had a right common iliac artery stent placed.  She is followed in the office regularly for surveillance of a right femoral-popliteal bypass.  There is evidence of marked elevation of velocity to the proximal right femoral-popliteal graft and common femoral artery.  She is brought to the catheterization lab at this time for diagnostic arteriography.  PROCEDURE NOTE:  The patient was brought to the catheterization lab in stable condition.  Informed consent obtained.  Both groins prepped and draped in sterile fashion in the supine position.  Administered 2 mg of Nubain, 2 mg Versed intravenous.  Skin and subcutaneous tissue of the left groin instilled with 1% Xylocaine.  Needle introduced in the left common femoral artery.  A 0.035 J wire passed through the needle into the left common iliac artery.  The J wire, however, could not be passed beyond the aortic bifurcation.  A 5-French sheath was advanced over the J wire.  The dilator removed.  Sheath flushed  with heparin saline solution.  The J wire then removed and a 0.035 Magic torque wire inserted.  This was advanced through the aortic bifurcation into the mid abdominal aorta.  A standard pigtail catheter was then advanced over the guidewire and positioned in the juxtarenal aorta.  A standard AP mid abdominal aortogram obtained.  This revealed the main renal arteries bilaterally to be widely patent without evidence of significant stenosis.  There was a large inferior pole right accessory renal artery which was also widely patent.  The infrarenal aorta revealed mild atherosclerotic irregularity.  The aortic bifurcation was narrowed and revealed tapering due to plaque.  This was estimated to be 40%.  There is a right common iliac stent in place, and there is no evidence of in-stent restenosis.  The left common iliac artery revealed a long moderate narrowing due to plaque to its mid portion.  Pelvic arteriogram revealed the common iliac arteries bilaterally without dominant stenosis.  The external iliac arteries were patent; were, however, were mildly diffusely narrowed due to layering plaque.  There was continuous flow bilaterally to the common femoral level.  The internal iliac arteries were patent bilaterally.  Lower extremity runoff arteriography was obtained.  Right lower extremity runoff arteriography revealed the right external artery to be diffusely small in caliber.  There was a severe long stenosis of the right common femoral artery.  The right femoral-popliteal bypass graft was patent.  The right profunda femoris artery appeared widely patent.  There was an anastomosis to the above-knee popliteal segment which also appeared widely patent.  There was continuous  flow through a mildly to moderately diseased right popliteal artery.  Patent three-vessel tibial artery runoff present in the right calf.  In the left leg, the external iliac artery was also diffusely mildly narrowed with  layering plaque.  The left profunda femoris artery was noted to be quite large.  The left superficial femoris artery occluded at its origin.  Extensive tortuous collaterals reconstituted the left popliteal artery at the level of the patella.  The left popliteal artery was patent down to the tibial artery takeoff, and the three tibial arteries were patent in the left calf.  Oblique projections of the pelvis were then obtained.  These again verified a severe stenosis of the right common femoral artery at the level of the right femoral-popliteal anastomosis.  This completed the arteriogram procedure.  The patient tolerated this well without apparent complications.  The guidewire was reinserted and the catheter and guidewire removed.  The sheath flushed again.  The left femoral sheath removed without apparent complication.  FINAL IMPRESSION: 1. Widely patent main renal arteries bilaterally, accessory inferior pole    right renal artery. 2. Moderate aortoiliac occlusive disease with narrowing of the aortic    bifurcation. 3. Patent right proximal common iliac artery stent. 4. 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 5. Patent right femoral-popliteal bypass graft. 6. Occluded left superficial femoral artery.  DISPOSITION:  These results have been discussed with the patient and family. The patient will require repair of right common femoral artery stenosis which will be scheduled for the next week to 10 days. Dictated by:   Denman George, M.D. Attending Physician:  Melvenia Needles DD:  09/29/01 TD:  09/30/01 Job: [id number]

## 2011-04-16 NOTE — Assessment & Plan Note (Signed)
[facility]                            [facility] OFFICE NOTE   NAME:Mooney, Alexandra                        MRN:          [id number]  DATE:11/07/2006                            DOB:          [birth year]    HISTORY:  Patient is a 70 year old female who is seen today for an  annual physical.  She has extensive vascular disease.  She is status  post coronary artery bypass grafting in 2001.  In 2000 she had a right  femoral popliteal bypass.  She was re operated on in 2002 for occlusion  on the right.  In 2005 she underwent a left carotid subclavian bypass.  She has hypercholesterolemia, hypertension and also history of colonic  polyps. Her last colonoscopy was in 2006.  She had a Cardiolite stress  test in 2005.  Her main complaint today is right foot pain.  This seems  to be over the MTP joints especially the second and third and worse at  night.  She has no real claudication.  It is followed closely by CVTS.   FAMILY HISTORY:  Unchanged.  Positive for cardiac disease, diabetes, no  family history of cancer.   PHYSICAL EXAMINATION:  GENERAL APPEARANCE:  Exam revealed a well-  developed female in no acute distress.  VITAL SIGNS:  Blood pressure 120/60 range in both arms.  HEENT:  Fundi, ears, nose and throat clear.  NECK:  Revealed bruits in carotid and supraclavicular areas.  CHEST:  Clear.  CARDIOVASCULAR:  Revealed a grade 2-3/6 systolic murmur.  BREASTS:  Negative.  ABDOMEN:  Benign.  Multiple surgical scars present.  She did have bruits  in both femoral artery areas.  EXTREMITIES:  Negative.  Dorsalis pedis pulses were intact.  Posterior  tibial pulses were not easily palpable.  NEUROLOGICAL:  Negative.   IMPRESSION:  1. Hypertension.  2. Coronary artery disease.  3. Peripheral vascular occlusive disease.  4. Polyps.  5. Right foot pain.   DISPOSITION:  She has been on Neurontin since her right femoral artery  occlusion in 2002.  Will  consider orthopedic followup.  Laboratory  studies will be reviewed.     Gordy Savers, MD  Electronically Signed   PFK/MedQ  DD: 11/07/2006  DT: 11/07/2006  Job #: [id number]

## 2011-04-16 NOTE — Op Note (Signed)
[place]. [facility]  Patient:    [name]                       MRN: [id number] Proc. Date: 06/27/00 Adm. Date:  [id number] Attending:  Cleatrice Burke CC:         Lewayne Bunting, M.D.                           Operative Report  PREOPERATIVE DIAGNOSIS:   Severe three vessel coronary artery disease with unstable angina.  POSTOPERATIVE DIAGNOSIS:  Severe three vessel coronary artery disease with unstable angina.  PROCEDURE: 1.  Mediastinotomy. 2.  Extracorporeal circulation. 3.  Coronary artery bypass graft surgery times four using left internal     mammary artery to left anterior descending artery, with saphenous vein     graft to obtuse marginal branch of left circumflex artery, and sequential     saphenous vein graft to posterior descending artery and posterolateral     branch of right coronary artery.  SURGEON:  Alleen Borne, M.D.  ASSISTANT:  Eugenia Pancoast, P.A.  ANESTHESIA:  General endotracheal anesthesia.  INDICATIONS:  This patient is a 70 year-old white female smoker with hypertension, hypercholesterolemia, and peripheral vascular disease who has had crescendo anginal symptoms and a positive stress test.  Cardiac catheterization showed calcified proximal coronary arteries with a long, 60 to 70%, proximal left anterior descending artery stenosis.  There is a hazy 99% proximal left circumflex artery stenosis.  The right coronary artery was diffusely diseased with a 90% mid-vessel stenosis and an 80% distal stenosis. The left ventricular ejection fraction was greater than 60%.  After review of the angiogram and examination of the patient, it was felt that coronary artery bypass graft surgery was the best treatment.  I discussed the operative procedure with the patient and her daughter, including alternatives, benefits, and risks, including bleeding, possible blood transfusion, infection, stroke, myocardial infarction, and death.   They understood and agreed to proceed.  PROCEDURE IN DETAIL:  The patient was taken to the operating room and placed on the table in the supine position.  After induction of general endotracheal anesthesia a Foley catheter was placed in the bladder using sterile technique. The chest, abdomen, and both lower extremities were prepped and draped in the usual sterile manner.  The chest was entered through a mediastinotomy incision.  The pericardium was opened in the midline.  The examination of the heart showed good ventricular contractility.  The ascending aorta had no palpable plaques in it.  The left internal mammary artery was harvested from the chest wall to pedicle graft.  This was a medium caliber vessel with excellent blood flow.  At the same time a segment of greater saphenous vein was harvested from the left leg and this vein was of medium caliber and good quality with the exception of approximately 6 to 8 inches in the midportion which was sclerotic and too small to use.  The patient was placed on cardiopulmonary bypass and the distal coronary artery was identified.  The left anterior descending artery was a medium sized graftable vessel.  There was diffuse disease.  The obtuse marginal branch was a large vessel that was diffusely diseased.  The posterior descending artery and posterolateral branches were both medium sized vessels.  The posterior descending artery was diffusely diseased.  The posterolateral branch had no significant distal disease in it.  The aorta was cross-clamped with 500 cc of cold blood antegrade cardioplegia which was administered into the aortic root with quick arrest of the heart. Systemic hypothermia to 20 degrees Centigrade and topical hypothermia were used.  The temperature probe was placed in the septum in an insulating pad in the pericardium.  The first distal anastomosis was performed to the obtuse marginal branch of the left circumflex artery.   The internal diameter was 1.6 mm.  The conduit used was a segment of greater saphenous vein.  The anastomosis formed in an end-to-side manner using continuous 7-0 Prolene suture.  Flow was measured through the graft and was excellent.  The second distal anastomosis was performed to the posterior descending artery branch.  The internal diameter was 1.5 mm.  The conduit used was a second segment of greater saphenous vein. The anastomosis was performed in a sequential side-to-side manner using continuous 8-0 Prolene suture.  Flow was measured through the graft and was excellent.  The third distal anastomosis was performed to the posterolateral branch.  The internal diameter was 1.6 mm.  The conduit used was the same segment of greater saphenous vein.  The anastomosis was performed in a sequential end-to-side manner using continuous 7-0 Prolene suture.  Flow was measured thu the graft and was excellent.  Cardioplegia was given down the vein grafts and in the aortic root.  The fourth distal anastomosis was performed to the midportion of the left anterior descending artery.  The internal diameter was about 1.6 mm.  The conduit used was the left internal mammary artery and this was brought through an opening in the left pericardium anterior to the phrenic nerve.  This was anastomosed to the left anterior descending artery in end-to-side manner using continuous 8-0 Prolene suture.  The pedicle was tacked to the epicardium using 6-0 Prolene suture.  The patient was rewarmed to 37 degrees Centigrade and the clamp was removed from the mammary pedicle.  There was rapid warming of the ventricular septum and return to spontaneous ventricular fibrillation.  The cross-clamp was removed with a time of 50 minutes and the patient converted to sinus rhythm. The partial occlusion clamp was placed on the aortic root and the two proximal vein graft anastomoses were performed in end-to-side manner using  continuous 6-0 Prolene suture.  The clamp was removed.  The vein graft was de-aired.  The  proximal and distal anastomoses appeared hemostatic and the grafts were satisfactory.  Graft marker was placed around the proximal anastomosis.  Two right ventricular and right atrial pacing wires were placed and brought out through the skin.  The patient was rewarmed to 37 degrees Centigrade.  She was weaned from cardiopulmonary bypass on non-inatropic agents.  The total bypass time was 82 minutes.  The cardiac function appeared excellent and the cardiac output was 4 to 5 liters per minute.  Protamine was given and the venous and aortic cannulas were removed without difficulty.  Hemostasis was achieved. Three chest tubes were placed with two in the posterior pericardium and one in the left pleural space and one in the anterior mediastinum.  The pericardium was reapproximated.  The sternum was closed with #6 stainless steel wires. The fascia was closed with continuous 1-0 Vicryl suture.  The subcutaneous tissue was closed with continuous 2-0 Vicryl and the skin with 3-0 Vicryl subcuticular closure.  The lower extremity harvest site was closed in layers in similar manner.  Sponge, needle, and instrument count was correct according to the scrub nurse.  A dry sterile dressing was applied over the incisions and around the chest tubes which were hooked up to suction.  The patient remained in the operating room stable and was transferred to the Surgical Intensive Care Unit in guarded but stable condition. DD:  06/27/00 TD:  06/28/00 Job: [id number]

## 2011-04-16 NOTE — Op Note (Signed)
[place]. [facility]  Patient:    [name], [name] Visit Number: [id number] MRN: [id number]          Service Type: SUR Location: 3700 [id number] Attending Physician:  Melvenia Needles Dictated by:   Denman George, M.D. Proc. Date: 10/19/01 Admit Date:  10/19/2001 Discharge Date: 10/24/2001                             Operative Report  SURGEON:  Denman George, M.D.  ASSISTANT:  Sherrie George, P.A.  ANESTHESIA:  General endotracheal.  ANESTHESIOLOGIST:  Bedelia Person, M.D.  PREOPERATIVE DIAGNOSIS:  Right common femoral artery stenosis.  POSTOPERATIVE DIAGNOSIS:  Right common femoral artery stenosis.  PROCEDURE 1. Right common femoral endarterectomy, Gore-Tex interposition graft. 2. Reimplantation of right femoral-popliteal bypass graft.  INDICATIONS:  This is a 70 year old female with a history of peripheral vascular disease.  She has previously undergone a redo right femoral popliteal bypass.  Follow-up evaluation reveals evidence of progression of disease in the right common femoral artery.  Arteriography verified severe stenosis of the right common femoral artery.  These results were discussed with the patient and it was recommended that she undergo an exploration and repair of the right common femoral artery, due to the potential threat for failure of the right femoral popliteal bypass graft.  The patient consented for surgery.  DESCRIPTION OF PROCEDURE:  The patient was brought to the operating room in stable condition.  She was placed in the supine position.  General endotracheal anesthesia was induced.  The right leg was prepped and draped in a sterile fashion.  A longitudinal skin incision was made through the right groin scar.  Dissection was carried down through the subcutaneous tissue.  The proximal right femoral popliteal Gore-Tex bypass graft was identified.  This was dissected down through an end-to-side anastomosis of the  right common femoral artery.  The right common femoral artery was severely diseased with plaque.  The right common femoral artery was immobilized up to the inguinal ligament and the external iliac artery freed and encircled under the inguinal ligament with a Vesi-loop.  Distal dissection was carried down to expose the origin of the occluded right superficial femoral artery and the patent right profunda femoris artery.  These were freed and encircled with Vesi-loops.  The patient was administered 5000 units of heparin intravenously.  Adequate circulation time was permitted.  The right femoral vessels were controlled with clamps.  The right femoral-popliteal bypass was taken down and controlled with a Fogarty clamp.  The right common femoral artery was severely diseased. This was endarterectomized; however, at completion of the endarterectomy the quality of the vessel was felt to be very poor.  The right common femoral artery was then resected from the external iliac artery down to the right profunda femoris artery.  An interposition graft of 6.0 mm Gore-Tex was then anastomosed end-to-end to the right common femoral artery and brought down to the right profunda femoris artery and anastomosed end-to-end.  A longitudinal incision was then placed in the Gore-Tex graft and the femoral-popliteal graft was reimplanted into the interposition common femoral graft using running #6-0 Prolene suture in an end-to-side fashion.  At completion of this anastomosis all vessels were all flushed.  Clamps were removed.  Excellent flow was present.  Adequate hemostasis was obtained.  The sponge and instrument counts were correct.  The wound was irrigated with antibiotic solution.  The groin  wound was then closed with two layers of running #2-0 Vicryl suture for the subcutaneous tissue.  Staples were applied to the skin.  Sterile dressings were applied.  The patient tolerated the procedure well.  She was  transferred to the recovery room in stable condition. Dictated by:   Denman George, M.D. Attending Physician:  Melvenia Needles DD:  02/17/02 TD:  02/19/02 Job: [id number]

## 2011-04-16 NOTE — H&P (Signed)
NAME:  [name], [name]                         ACCOUNT NO.:  [id number]   MEDICAL RECORD NO.:  [id number]                   PATIENT TYPE:  OIB   LOCATION:  2899                                 FACILITY:  [facility]   PHYSICIAN:  Balinda Quails, M.D.                 DATE OF BIRTH:  [birth year]   DATE OF ADMISSION:  08/14/2004  DATE OF DISCHARGE:  08/14/2004                                HISTORY & PHYSICAL   Her primary physician is Gordy Savers, M.D.  Her cardiologist is Learta Codding, M.D.   CHIEF COMPLAINT:  Left subclavian artery disease.   HISTORY OF PRESENT ILLNESS:  Ms. Jackowski is a 70 year old Caucasian female  who is well-known to Dr. Madilyn Fireman of [facility], as she has a past medical history of  peripheral vascular disease and is status post multiple failed right lower  extremity bypass grafts and ultimately underwent an aortobifemoral bypass  graft in November 2004 by Dr. Madilyn Fireman.  She has done well postoperatively.  She has also been seen regularly by Dr. Andee Lineman and reports a recent normal  cardiac stress test a few months ago, although an official report is  currently unavailable.  She has had a known discrepancy in her systolic  blood pressure readings with a 40-point difference, reading higher on her  right arm.  Her radial pulse is also much weaker in the left.  Recently she  also developed dyspnea on exertion but denied shortness of breath or chest  pain.  She was referred back to Dr. Madilyn Fireman for further evaluation for  probable left subclavian artery disease.   PAST MEDICAL HISTORY:  1.  Aortoiliac occlusive disease, status post AFBG.  2.  Peripheral vascular occlusive disease with history of right FPBG with      history of redo and right femoral endarterectomy.  3.  Coronary artery disease, status post CABG.  4.  History of congestive heart failure.  5.  Hypertension.  6.  Dyslipidemia.  7.  Osteoarthritis.   PAST SURGICAL HISTORY:  1.  Status post aortobifemoral bypass  graft by Dr. Madilyn Fireman in November 2004.  2.  Status post right femoral-popliteal bypass graft in June 2000, status      post redo using 6 mm Gore-Tex graft in February 2002.  This was followed      by a right femoral endarterectomy with Gore-Tex interposition graft in      November 2002.  3.  Coronary artery bypass grafting x4 by Dr. Laneta Simmers in July 2001.   ALLERGIES:  She is allergic to CODEINE, which causes nausea.   MEDICATIONS:  1.  Altace 10 mg one p.o. daily.  2.  Aspirin 81 mg one p.o. daily.  3.  Vytorin 10/80 mg one p.o. q.h.s.  4.  Hydrochlorothiazide 25 mg one p.o. daily.  5.  Toprol XL 100 mg one p.o. daily.  6.  Naprosyn 500 mg one  p.o. b.i.d.  7.  Fish oil capsule daily.  8.  Lorazepam 0.5 mg p.o. daily p.r.n.  9.  Isosorbide 30 mg p.o. daily.  10. Neurontin 300 mg p.o. q.h.s.  11. Hydrocodone 7.5 mg one-half to one tablet p.o. p.r.n.  12. Nitro-Tab 0.4 mg sublingual p.r.n. chest pain.  She reports she has not      had to take any of these tablets, however.   SOCIAL HISTORY:  She is married and lives alone.  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  She does not currently use  tobacco, as she quit smoking in July 2001; however, she does have a 40-year  history of smoking 1-1/2 packs per day.  She does not use alcohol.   FAMILY HISTORY:  Her mother and father are both deceased and had a history  of coronary artery disease, osteoarthritis, and diabetes mellitus.  Her  father also had a history of a stroke.  Her siblings have osteoarthritis and  diabetes as well.   REVIEW OF SYSTEMS:  Please refer to history of present illness.  In  addition, she does report mild lower extremity edema, more prominent with  prolonged standing.  This is better with elevation of her feet.  She denies  abdominal pain or hematochezia.  She denies dysuria, but she does have  occasional stress incontinence but not significant.  She denies any leg  pain; however, her  feet feel heavy after working and she does have some pain  from calluses on her bilateral feet.  She does not report any coolness in  her upper extremities.  She also does not report any significant pain in her  left upper extremity either.   PHYSICAL EXAMINATION:  VITAL SIGNS:  170/73, heart rate 69, temperature  96.4, height 63 inches, weight 149.1 pounds.  GENERAL:  This is a 70 year old Caucasian female who is status post  arteriogram.  She is currently on bed rest and is alert and cooperative.  HEENT:  Her pupils equal, round, and reactive to light and accommodation.  Her sclerae are nonicteric.  Her oral mucous membranes are pink and moist.  Her teeth are in fairly good condition.  She does not wear dentures.  No  lesion or erythema is noted.  NECK:  Her neck is supple with no thyromegaly or lymphadenopathy.  She does  have palpable carotid pulses with a soft right bruit auscultated on the  right.  There was no bruit auscultated on the left.  CHEST:  Her breathing is unlabored.  Her lung sounds are clear throughout.  No wheezes, rhonchi, or rales are noted.  CARDIAC:  Her heart is a regular rate and rhythm.  No murmur, rub, or gallop  were noted.  ABDOMEN:  Her abdomen is soft, nontender, nondistended.  She does have a  well-healed abdominal incisional scar.  She has normoactive bowel sounds x4  quadrants, no hepatosplenomegaly or masses were noted.  GENITOURINARY, RECTAL:  These exams were deferred.  EXTREMITIES:  Her extremities are warm and dry.  There is no edema or  cyanosis.  She has 2+ femoral pulses bilaterally.  There is no groin  hematoma.  She does have bilateral calluses on the balls of her feet on the  plantar surfaces.  She also has a blister between her second and third toes  on her right foot, but there is no other breakdown noted.  She does have 2+  dorsalis pedis pulses bilaterally; however,  I was unable to palpate posterior tibial pulses but was able to get good  Doppler signals  bilaterally.  She does have a 2+ radial pulse on the right and a barely-  palpable radial pulse on the left.  NEUROLOGIC:  Her neurologic exam is grossly intact.  She is alert and  oriented x4.  Her speech is clear.  Her gait was unobserved as she is  currently on bed rest, status post arteriogram.  Her strength is strong in  all her extremities.   IMPRESSION:  Ms. Collison is a 70 year old Caucasian female with history of  severe peripheral vascular occlusive disease, coronary artery disease, and  aortoiliac occlusive disease.  She now presents with dyspnea on exertion,  discrepancy in her blood pressures, and decreased pulse on the left.  She  underwent an arteriogram by Dr. Madilyn Fireman.  The final dictated report is  unavailable at this time, but apparently it showed or confirmed left  subclavian artery disease.   PLAN:  She will undergo left subclavian artery bypass graft by Dr. Madilyn Fireman on  August 19, 2004, at [facility].  Dr. Madilyn Fireman has seen and  evaluated this patient prior to this admission and has explained the risks  and benefits of the above procedure, and she has agreed to proceed.       AWZ/MEDQ  D:  08/14/2004  T:  08/15/2004  Job:  [id number]

## 2011-04-16 NOTE — Op Note (Signed)
NAME:  [name], [name]                         ACCOUNT NO.:  [id number]   MEDICAL RECORD NO.:  [id number]                   PATIENT TYPE:  OIB   LOCATION:  2887                                 FACILITY:  [facility]   PHYSICIAN:  Larina Earthly, M.D.                 DATE OF BIRTH:  [birth year]   DATE OF PROCEDURE:  10/02/2003  DATE OF DISCHARGE:  09/25/2003                                 OPERATIVE REPORT   PREOPERATIVE DIAGNOSIS:  Bilateral lower extremity arterial occlusive  disease.   POSTOPERATIVE DIAGNOSIS:  Bilateral lower extremity arterial occlusive  disease.   PROCEDURE:  Aortogram with bilateral lower extremity runoff.   SURGEON:  Larina Earthly, M.D.   ANESTHESIA:  1% lidocaine local.   COMPLICATIONS:  None.   DISPOSITION:  To holding area stable.   PROCEDURE IN DETAIL:  The patient was taken to the peripheral vascular  catheterization lab, placed in the supine position, where the area of both  groins was prepped and draped in the usual sterile fashion.  Using local  anesthesia and a single wall puncture, the left common femoral artery was  entered and a guidewire was passed up to the level of the suprarenal aorta.  The patient had extensive calcification of the aortoiliac segments on  fluoroscopy.  An AP projection was undertaken at this level, and this  revealed single, widely patent renal arteries bilaterally.  The celiac axis  revealed flow in to the hepatic, splenic arteries.  The superior mesenteric  artery did not have any flow visualized on the AP projection.  This may have  been patent but simply overlying the aorta.  The catheter was then withdrawn  down to the aortic bifurcation.  AP and runoff was obtained through this  injection.  There was a complete occlusion of the right external iliac  artery with a patent internal iliac artery on the right.  There was severe  stenosis throughout the left external iliac artery with subtotal occlusion  of this with the  pigtail catheter in this position.  There was  reconstitution of the profunda femoris artery at the right groin.  There was  superficial femoral artery occlusion bilaterally with well-developed  profunda collaterals.  Runoff on the right was via reconstituted small  popliteal artery with reconstituted anterior tibial, posterior tibial, and  peroneal arteries on the right.  These were patent into the foot with good  posterior tibial and anterior tibial runoff into the foot on the right.  On  the left there was reconstitution of normal-appearing above-knee popliteal  artery.  There was runoff on the left via the anterior tibial, posterior  tibial, and peroneal arteries.  Again there was good flow through the  anterior tibial and posterior tibial arteries into the foot.  The patient  tolerated the procedure without complication and was transferred to the  holding area in stable condition.   FINDINGS:  1. Severely calcified aortoiliac segment with no flow-limiting aortic     occlusive disease.  2. Widely patent single renal arteries bilaterally.  3. Reconstitution of the profunda femoris artery on the right with complete     occlusion of the right external iliac artery.  4.     Bilateral superficial femoral artery occlusions with reconstituted above-     knee left popliteal artery with three-vessel runoff.  5. Reconstituted small below-knee popliteal artery on the right with tibial     runoff as described.                                               Larina Earthly, M.D.    TFE/MEDQ  D:  10/02/2003  T:  10/02/2003  Job:  [id number]

## 2011-04-16 NOTE — Op Note (Signed)
NAME:  [name], [name]                         ACCOUNT NO.:  [id number]   MEDICAL RECORD NO.:  [id number]                   PATIENT TYPE:  OIB   LOCATION:  2899                                 FACILITY:  [facility]   PHYSICIAN:  Balinda Quails, M.D.                 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   PROCEDURES PERFORMED:  1.  Arch aortogram.  2.  Abdominal aortogram.  3.  Innominate arteriogram.   SURGEON:  P. Liliane Bade, M.D.   ACCESS:  Right common femoral artery, 5 French sheath.   CONTRAST MATERIAL:  One-hundred-twenty milliliters Visipaque.   COMPLICATIONS:  None apparent.   CLINICAL NOTE:  [name] is 70 year old female with a history of  significant atherosclerotic vascular disease.  She has previously undergone  aortobifemoral bypass and coronary artery bypass.  She has peripheral  vascular disease with failed infrainguinal revascularization of her lower  extremities; and chronic claudication.  She has a left internal mammary LAD  bypass graft.  Recent evaluation revealed marked reduction in blood pressure  in her left arm.  She underwent a Cardiolite study, which revealed ischemia  in the LAD distribution and is brought to the cath lab at this time for  diagnostic arteriography for further workup of left subclavian disease.   PROCEDURAL NOTE:  The patient was brought to the cath lab in stable  condition and was placed in the supine position.  The right groin was  prepped and draped in a sterile fashion.  I administered 25 mcg of fentanyl  and 1 mg of Versed intravenously.  I also administered 10 mg of labetalol  intravenously.  Skin and subcutaneous tissue, and right groin were instilled  with 1% Xylocaine.  The needle was easily introduced into the right limb of  the aortobifemoral  bypass graft.  A 0.035 Amplatz guidewire was advanced  through the needle.  The needle was removed.  The 5 French sheath was  advanced over the guidewire.  The dilator was removed and the sheath was  flushed with heparin saline solution.   A standard pigtail catheter was then advanced over the guidewire.  The  guidewire and catheter were advanced into the ascending aorta.   LAO projection arch aortogram obtained.  This revealed a type II aortic  arch.  The innominate artery was widely patent.  Mild disease was noted at  the origin of the left common carotid artery.  The right subclavian and  right common carotid origins were widely patent.  There was a large right  vertebral artery.  The left subclavian artery was occluded and at its  origin.  Retrograde flow was noted in the left vertebral artery with  reconstitution of the left subclavian artery  in its mid portion.   The pigtail catheter was then brought down to the abdominal aorta.  A flush  abdominal aortogram was obtained.  This was requested by cardiology  secondary to accelerated hypertension.  This revealed minimal disease of the  renal arteries, which were single bilaterally.  The aortobifemoral graft was  widely patent without evidence of pseudoaneurysm formation in the proximal  anastomosis.   A guidewire exchange was then carried out for a JB-1 catheter.  The JB-1  catheter was engaged into the innominate artery.  An innominate injection  was then made and this revealed antegrade flow up the right vertebral  artery, retrograde flow in the left vertebral artery with reconstitution of  the subclavian artery.  The proximal left subclavian artery was occluded.   This completed the arteriogram procedure.  The catheter was removed.  Right  femoral sheath was removed.   COMPLICATIONS:  No apparent complications.   Total contrast, 120 mL Visipaque.   FINAL IMPRESSION:  1.  Total proximal left subclavian artery occlusion with  retrograde left      vertebral flow and reconstitution of the mid left subclavian artery.  2.  At risk, left internal mammary left anterior descending bypass graft      secondary proximal left subclavian occlusion.  3.  Widely patent bilateral single renal arteries.   DISPOSITION:  These results have been reviewed with the patient and family.  Due to the at risk left internal mammary LAD bypass graft the patient will  be scheduled for elective left carotid subclavian bypass.                                               Balinda Quails, M.D.    PGH/MEDQ  D:  08/14/2004  T:  08/15/2004  Job:  [id number]   cc:   Gordy Savers, M.D. [facility]   Learta Codding, M.D. [facility]

## 2011-04-16 NOTE — Procedures (Signed)
[facility]  Patient:    [name], [name]                        MRN: [id number] Proc. Date: 08/02/00 Attending:  Denman George, M.D. CC:         [facility]             Gordy Savers, M.D. [facility]             Lewayne Bunting, M.D., [facility]                           Procedure Report  DIAGNOSES:  Occluded right femoral-popliteal bypass.  PROCEDURES:  Midabdominal aortogram with bilateral lower extremity runoff arteriography.  ACCESS:  Left common femoral 5 French sheath.  CONTRAST:  Visipaque 170 mL (generalized severe debilitation).  COMPLICATIONS:  None apparent.  CLINICAL NOTE:  This is a 70 year old female who underwent right femoral-popliteal bypass approximately one year ago for marked limiting claudication of the right leg.  She has subsequently undergone coronary artery bypass.  She is in recovery period from coronary artery bypass.  She has developed recurrent claudication of the right leg.  Evaluation in the office revealed evidence of occlusion of the right femoral-popliteal bypass.  She is brought to the [facility]atheterization Lab at this time for diagnostic arteriography.  DESCRIPTION OF PROCEDURE:  The patient was brought to the [facility] Cardiac Catheterization Laboratory in stable condition.  Informed consent was obtained.  She was placed in the supine position.  Versed 2 mg and 2 mg of Nubain was administered intravenously.   Both groins were prepped and draped in a sterile fashion.  Skin and subcutaneous tissue in the left groin was instilled with 1% Xylocaine.  A needle was easily introduced to the left common femoral artery, and a 0.35 J wire passed through the needle into the midabdominal aorta under fluoroscopic control.  The needle was removed.  A short 5 French sheath and dilator were inserted over the guidewire and the dilator removed.  The sheath was flushed with  heparin saline solution.  Under fluoroscopic control a pigtail catheter was advanced over the guidewire and positioned in the midabdominal aorta.  A standard AP midabdominal aortogram was obtained.  This revealed widely patent single bilateral renal arteries.  The infrarenal aorta revealed mild atherosclerotic irregularity. The pigtail was brought down to the aortic bifurcation.  Standard AP pelvic arteriogram was obtained.  There was evidence of a complex plaque arising from the right wall of the distal abdominal aorta and extending into the origin of the right common iliac artery.  This resulted in a approximately 80% stenosis of the right common iliac artery.  The left common iliac artery revealed a calcified plaque but no dominant stenosis.  Magnified views of the aortic bifurcation were obtained.  These verified evidence of a complex plaque arising from the right wall terminal abdominal aorta extending into the origin of the right common iliac artery.  The pigtail catheter was then exchanged over the guidewire and an IMA catheter was engaged into the origin of the right common iliac artery.  A RAO oblique right lower extremity runoff arteriogram was obtained.  This revealed patency of the remainder of the right common iliac artery.  The right internal and external iliac arteries were noted to be small in caliber but no dominant stenosis evident.  Right lower extremity runoff  arteriography was then obtained.  There was continuous flow into the right common femoral artery.  The site of occlusion of the right femoral popliteal bypass graft was evident.  The right profunda femoris artery was patent.  The right superficial femoral artery revealed multiple subtotal occlusions proximally and was occluded at the level of the abductor canal.  Right profunda femoris collaterals reconstituted the right above-knee popliteal artery which then flowed continuously to the below-knee popliteal  artery.  The right anterior tibial, tibial peroneal trunk, and posterior tibial and peroneal arteries were patent.  The guidewire was then reinserted and the IMA catheter removed over the guidewire.  Retrograde injection was then made through the left common femoral sheath.  Left iliac arteriography in the RAO and LAO projections revealed no significant stenosis of the left common iliac, internal or external iliac arteries.  Left lower extremity runoff arteriography was obtained.  This revealed patency of the left common femoral artery.  There was a large left profunda femoris artery.  The left superficial femoral artery was occluded at its origin. Extensive left profunda femoral collaterals reconstituted the left above-knee popliteal artery.  This flowed continuously to the left below-knee popliteal artery.  The left anterior tibial, tibial peroneal trunk, posterior tibial, and peroneal arteries were patent.  This completed the arteriogram procedure.  The patient tolerated this without apparent complication.  The sheath was removed and pressure placed on the left groin without apparent complication.  FINAL IMPRESSION: 1. 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 2. Bilateral superficial femoral artery occlusions. 3. Right femoral-popliteal bypass graft occlusion.  DISPOSITION:  These results will be reviewed with the patient, recommendations for further treatment will be made following this. DD:  08/02/00 TD:  08/02/00 Job: [id number]

## 2011-04-16 NOTE — Op Note (Signed)
NAMEMIKITA, LESMEISTER NO.:  [id number]   MEDICAL RECORD NO.:  [id number]          PATIENT TYPE:  INP   LOCATION:  3315                         FACILITY:  [facility]   PHYSICIAN:  Balinda Quails, M.D.    DATE OF BIRTH:  1941/09/10   DATE OF PROCEDURE:  08/19/2004  DATE OF DISCHARGE:                                 OPERATIVE REPORT   PREOPERATIVE DIAGNOSIS:  Left subclavian artery occlusion.   POSTOPERATIVE DIAGNOSIS:  Left subclavian artery occlusion.   OPERATION PERFORMED:  Left carotid subclavian bypass with 6 mm Dacron graft.   SURGEON:  Balinda Quails, M.D.   ASSISTANT:  Toribio Harbour, N.P.   ANESTHESIA:  General endotracheal.   ANESTHESIOLOGIST:  Burna Forts, M.D.   INDICATIONS FOR PROCEDURE:  [name] is a 70 year old female with a  history of diffuse atherosclerotic vascular disease.  She has previously  undergone aortobifemoral bypass and coronary artery bypass.  She has a left  internal mammary artery LAD bypass graft.  She has an occlusion of the left  subclavian artery.  She has recently developed fatigue in her left arm and  some symptoms of mild angina.  She is brought to the operating room at this  time for revascularization of the left subclavian with carotic subclavian  bypass.  The risks of this operative procedure were explained to the patient  in detail with the major morbidity and mortality of 1 to 2% to include but  not limited to nerve injury, stroke, MI, thoracic duct injury, and death.   DESCRIPTION OF PROCEDURE:  The patient was brought to the operating room in  stable condition.  Placed in supine position.  General endotracheal  anesthesia induced.  The head rotated to the left and extended.  The left  neck and chest prepped and draped in the sterile fashion.  A transverse skin  incision was made at the base of the left neck.  Dissection carried down  through subcutaneous tissue.  The platysma incised.  The clavicular  head of  the left sternomastoid muscle was divided.  The medial head of the  sternomastoid muscle was retracted.  The carotid sheath identified.  The  left internal jugular vein and vagus nerve were immobilized and retracted  laterally.  The proximal left common carotid artery was freed and encircled  with a vessel loop.   The supraclavicular fat pad was then mobilized and retracted superiorly.  The anterior scalene muscle was divided.  The phrenic nerve reflected  medially.  The proximal left subclavian artery was identified.  Just beyond  the take off of the internal mammary artery, the subclavian artery was freed  and encircled with a vessel loop.  A tunnel was then created from the left  common carotid behind the vagus and internal jugular vein.  A 6 mm Dacron  graft placed through the tunnel.  Patient administered 5000 units heparin  intravenously.  The left subclavian artery controlled proximally and  distally with bulldog clamps.  A longitudinal arteriotomy made.  6 mm Dacron  was beveled and anastomosed end-to-side to the  left subclavian artery using  running 6-0 Prolene suture.  The subclavian artery then flushed through the  graft, then the graft controlled with a Gregory clamp.   The left common carotid artery controlled proximally and distally with  Henley clamps.  Longitudinal arteriotomy made.  The Dacron graft divided and  anastomosed end-to-side to the left common carotid artery with running 6-0  Prolene suture.  Adequate flushing carried out.  Clamps were then removed.  Excellent flow present through the graft.  Adequate hemostasis obtained.  The patient was administered 50 mg of protamine intravenously.  The thoracic  duct was identified, ligated proximally and distally with 3-0 silk and  divided.   The supraclavicular fat pad was then placed over the graft.  The clavicular  head of the sternomastoid muscle was reapproximated with  interrupted 2-0  Vicryl suture.  The  platysma was closed with running 3-0 Vicryl suture.  Skin closed with 4-0 Monocryl.  Steri-Strips applied.  The patient tolerated  the procedure well.  Transferred to the recovery room in stable condition.       PGH/MEDQ  D:  08/19/2004  T:  08/20/2004  Job:  [id number]

## 2011-04-16 NOTE — Discharge Summary (Signed)
NAME:  Alexandra, Mooney                         ACCOUNT NO.:  [id number]   MEDICAL RECORD NO.:  [id number]                   PATIENT TYPE:  INP   LOCATION:  2017                                 FACILITY:  [facility]   PHYSICIAN:  Balinda Quails, M.D.                 DATE OF BIRTH:  [birth year]   DATE OF ADMISSION:  10/10/2003  DATE OF DISCHARGE:  10/16/2003                                 DISCHARGE SUMMARY   HISTORY OF PRESENT ILLNESS:  [name] is a 70 year old female who is well  known to [facility] having been followed for peripheral vascular  occlusive disease by Balinda Quails, M.D. as well as having a previous  coronary revascularization by Evelene Croon, M.D. in 2001.  She had a right  femoral to popliteal bypass graft in June of 2000 which failed and required  a redo procedure in February of 2002 and a right femoral endarterectomy  again in November of 2002.  Ankle brachial indexes remained relatively  stable but she presented on September 20, 2003 with increasing right lower  extremity pain.  Repeat ankle brachial index study was done showing  significant reduction in the ABI down to 0.21 from her previous value of  0.65 in June of 2004.  Her left leg remained fairly stable at 0.857 compared  to 0.63.  She was evaluated per Balinda Quails, M.D. as she was experiencing  right lower extremity claudication symptoms.  However, over the past few  months she noticed that it worsened to the level of rest pain symptoms.  She  denied any numbness or tingling; however, she did have some weakness related  to activity in her lower extremity.  Arteriogram was done on October 02, 2003 and this revealed significant aortoiliac disease as well as bilateral  superficial femoral artery occlusion.  Based on this study, P. Liliane Bade,  M.D. recommended aortobifemoral bypass grafting and she was admitted this  hospital for the procedure.  It is also noted that she underwent cardiac  preoperative  clearance by Olga Millers, M.D.   PAST MEDICAL HISTORY:  1. Peripheral vascular occlusive disease.  2. Hypertension.  3. Congestive heart failure.  4. Hypercholesterolemia.  5. Osteoarthritis.  6. Coronary artery disease.  7. History of a thrombosed right femoral to popliteal bypass graft in 2002.   PAST SURGICAL HISTORY:  1. Right femoral to popliteal bypass graft in 2000 with redo in February of     2002 with a right femoral endarterectomy and a right femoral arterial     interposition graft on October 18, 2001.  2. Coronary artery bypass grafting x4 by Evelene Croon, M.D. June 27, 2000.   MEDICATIONS ON ADMISSION:  1. Toprol XL 100 mg daily.  2. Altace 10 mg daily.  3. Ecotrin 81 mg daily.  4. Hydrochlorothiazide 25 mg daily.  5. Zocor 40 mg daily.  6. Neurontin 300  mg b.i.d.  7. Naprosyn 500 mg b.i.d.  8. Centrum Silver multivitamin one daily.  9. Lorazepam 0.5 mg one p.o. b.i.d. p.r.n.   ALLERGIES:  CODEINE causes nausea.   FAMILY HISTORY:  Please see the history and physical done at the time of  admission.   SOCIAL HISTORY:  Please see the history and physical done at the time of  admission.   REVIEW OF SYSTEMS:  Please see the history and physical done at the time of  admission.   PHYSICAL EXAMINATION:  Please see the history and physical done at the time  of admission.   HOSPITAL COURSE:  The patient was admitted and on October 10, 2003 she was  taken to the operating room where she underwent the following procedure:  Aortobifemoral bypass with bilateral profundoplasty.  Additionally, she  underwent reimplantation of the inferior mesenteric artery.  The patient  tolerated procedure well and was taken to the surgical intensive care unit  in stable condition.   POSTOPERATIVE HOSPITAL COURSE:  The patient has done quite well  postoperatively.  She has had no hemodynamic problems.  Her rhythm has  remained a normal sinus rhythm.  She was weaned from the  ventilator without  difficulty.  She has advanced in a steady manner regarding increasing  activity level as well as advancement in diet.  Her bowels have moved.  She  is afebrile with incisions healing well.  Extremities show palpable dorsalis  pedis pulses bilaterally.  Overall, she is felt to be tentatively quite  stable for discharge in the morning of October 16, 2003 pending morning  round reevaluation.   DISCHARGE MEDICATIONS:  1. Preoperative medicines including Neurontin, Toprol, Altace,     hydrochlorothiazide, aspirin, Zocor, vitamins.  All doses same as     preoperative.  2. For pain, Tylox one or two q.4-6h. as needed.   DISCHARGE INSTRUCTIONS:  The patient will receive written instructions in  regard to medications, activity, diet, wound care, and follow-up.  Follow-up  will include staple removal on November 22 in P. Liliane Bade, M.D. office.  She will see Balinda Quails, M.D. on Monday, December 13 at 9 a.m. with  repeat ankle brachial index Doppler studies.   FINAL DIAGNOSES:  1. Severe aortoiliac occlusive disease.  2. Peripheral vascular occlusive disease.  3. Hypertension.  4. Congestive heart failure.  5. Hypercholesterolemia.  6. Osteoarthritis.  7. Coronary artery disease.  8. Previous surgical procedures as outlined.   LABORATORIES:  Most recent laboratories are stable.  She does have a  postoperative anemia.  Hemoglobin and hematocrit dated October 14, 2003 are  8.4 and 24, respectively.  Electrolyte, BUN, and creatinine are all within  normal limits.      Rowe Clack, P.A.-C.                    Balinda Quails, M.D.    Sherryll Burger  D:  10/15/2003  T:  10/16/2003  Job:  [id number]   cc:   Learta Codding, M.D.   Gordy Savers, M.D. [facility]

## 2011-04-16 NOTE — Discharge Summary (Signed)
[place]  Patient:    [name], [name]                      MRN: [id number] Adm. Date:  [id number] Disc. Date: 01/09/01 Attending:  Melvenia Needles Dictator:   Areta Haber, P.A.-C. CC:         Gordy Savers, M.D.             Lewayne Bunting, M.D.                           Discharge Summary  HISTORY OF PRESENT ILLNESS:  This is a 70 year old female with known peripheral vascular occlusive disease and claudication, status post right femoropopliteal bypass graft with Gore-Tex in June 1990 by Dr. Madilyn Fireman.  She additionally has had bilateral iliac stenting by Dr. Madilyn Fireman in September 2001. She has done well until recently and has shown progressive claudication symptoms.  Arteriography dated September 2001 verified bilateral superficial femoral artery occlusive disease with occlusion of the right femoropopliteal bypass graft and progression of aortoiliac occlusive disease.  Most recent ankle-brachial index showed the right to be 0.56 and the left to be 0.61.  Dr. Madilyn Fireman recommended proceeding with re-do right femoropopliteal bypass graft, and she was admitted this hospitalization for the procedure.  PAST MEDICAL HISTORY: 1. Peripheral vascular occlusive disease. 2. Hypertension. 3. Coronary artery disease. 4. Remote history of tobacco abuse. 5. Carotid artery disease. 6. Osteoarthritis. 7. Hypercholesterolemia. 8. Recent history of congestive heart failure. 9. Ejection fraction 69% without ischemia per last Cardiolite in October 2001.  PAST SURGICAL HISTORY: 1. Bilateral iliac PTA and stenting by Dr. Madilyn Fireman. 2. Coronary artery bypass graft x 4 by Dr. Laneta Simmers. 3. Right femoropopliteal bypass graft by Dr. Madilyn Fireman.  CURRENT MEDICATIONS: 1. Zocor 40 mg p.o. q.d. 2. Toprol XL 50 mg p.o. q.d. 3. Altace 10 mg p.o. q.d. 4. Enteric-coated aspirin 325 mg q.d. 5. Naprosyn 500 mg b.i.d.  ALLERGIES:  The patient gets GI intolerance to  CODEINE.  Family history, social history, review of systems, and physical examination, please see the history and physical done at the time of admission to the hospital.  HOSPITAL COURSE:  The patient was admitted electively January 06, 2001, and was taken to the operating room where she underwent the following procedure: Re-do right femoral above-knee popliteal bypass with 6 mm Gore-Tex.  The patient tolerated the procedure well and was taken to the postanesthesia care unit in stable condition.  POSTOPERATIVE HOSPITAL COURSE:  The patient has remained hemodynamically stable.  She has remained afebrile.  Oxygen has been weaned, and she maintained good saturations on room air.  Her incisions are healing well.  Her graft is patent.  She is tolerating routine activities commiserate for level of postoperative convalescence.  Overall, she is felt to be in quite satisfactory condition for tentative discharge in the morning of January 09, 2001, pending morning round reevaluation.  DISCHARGE MEDICATIONS:  As preoperatively.  Additionally for pain, Tylox 1-2 every 4-6 hours as needed.  FINAL DIAGNOSIS:  Severe peripheral vascular occlusive disease, as described above.  OTHER DIAGNOSES:  Hypertension, coronary artery disease, remote tobacco abuse, carotid artery disease, osteoarthritis, hypercholesterolemia, history of congestive heart failure, history of bilateral iliac artery stents, history of coronary artery bypass grafting x 4. history of femoropopliteal bypass grafting.  INSTRUCTIONS:  The patient will receive written instructions in regard to medications, activity, diet, wound care, and follow-up.  DD:  01/08/01 TD:  01/09/01 Job: [id number]

## 2011-04-16 NOTE — Cardiovascular Report (Signed)
[facility]  Patient:    [name], [name]                      MRN: [id number] Proc. Date: 08/11/00 Adm. Date:  [id number] Attending:  Melvenia Needles CC:         Gordy Savers, M.D. [facility]             [facility]                        Cardiac Catheterization  DIAGNOSIS:  Right common iliac stenosis.  PROCEDURES: 1. Abdominal aortogram. 2. Pullback pressure gradient, left common iliac artery with intra-arterial    nitroglycerin injection. 3. Right common iliac percutaneous transluminal angioplasty with deployment    of Palmaz PQ [id number] stent. 4. Post deployment pullback pressure gradient, left common iliac artery.  ACCESS:  Bilateral common femoral artery, 7-French sheath.  CONTRAST:  Visipaque, 65 ml (generalized severe debilitation).  COMPLICATIONS:  None apparent.  CLINICAL NOTE:  This is a 70 year old female with diffuse atherosclerotic disease.  She had previously undergone coronary artery bypass, has known bilateral superficial femoral artery occlusion.  Has a failed right femoral-pop ______ bypass graft.  Recent arteriography revealed evidence of progression of aortoiliac disease with a large exophytic plaque at the origin of the right common iliac artery.  The results of the most recent arteriogram were discussed with the patient and her daughter in detail.  Options for treatment including conservative medical management, endovascular therapy, or operative management were discussed. Understanding the risks of the procedure, the patient consented for endovascular treatment.  The risks of this procedure including bleeding, vessel occlusion, vessel rupture, were all discussed.  The risk of potential emergency surgery was also discussed.  PROCEDURE NOTE:  The patient was brought to the [facility] catheterization lab in stable condition and placed in a supine position.  Both groins were prepped and draped in a  sterile fashion.  I administered two milligrams of Nubain, two milligrams of Versed intravenously.  The skin and subcutaneous tissue in the groins bilaterally were instilled with 1% Xylocaine.  The needle was easily introduced in the right common femoral artery.  A 0.035 Wholey guide wire was passed through the needle across the aortic bifurcation at the abdominal aorta under fluoroscopic control.  A long 7-French sheath was advanced into the right common iliac artery.  The dilator was removed.  The sheath was flushed with heparin and saline solution.  The left common femoral artery was also accessed with a needle.  A 0.035 J wire was passed through the needle and easily passed into the abdominal aorta. A short 6-French sheath was advanced into the left common femoral artery.  The dilator was removed, and the sheath was flushed with heparin saline solution.  A pigtail catheter was then advanced over the left femoral J wire and into the mid abdominal aorta.  Standard AP mid abdominal aortogram was obtained.  This verified an exophytic plaque at the origin of the right common iliac artery with approximately 80% stenosis.  The pigtail was then exchanged over the J wire for an end-fold catheter. Intra-arterial injection of 200 mcg of nitroglycerin was then injected into the left common femoral sheath.  Adequate circulation time was permitted. Pullback pressure gradient across the left common iliac artery was then obtained.  There was no significant pressure drop from the abdominal aorta to the left common iliac artery.  A PowerFlex 6 x 2 angioplasty balloon was then advanced over the right common femoral guide wire to the site of right common iliac artery stenosis.  This was inflated at six atmospheres for 30 seconds.  The balloon removed, and post inflation retrograde injection was made through the right common femoral sheath.  This revealed residual stenosis at the right common iliac  artery origin.  Due to less than optimal technical result with residual stenosis, a Palmaz PQ 184B stent was loaded on a 7 x 2 PowerFlex balloon.  The dilator was then reinserted through the right common femoral sheath, and the right common femoral sheath was advanced across the aortic bifurcation.  The PQ 184B stent with 7 x 2 PowerFlex balloon was then advanced over the guide wire to the position of right common iliac stenosis.  The sheath was then withdrawn.  Further retrograde injection made through the right common femoral sheath verifying position of the stent.  The stent was then deployed at eight atmospheres x 30 seconds.  Following stent deployment, the balloon was removed.  Retrograde injection was made through the right common femoral sheath.  This revealed good position of the stent with minimal impingement on the left common iliac origin.  To assure that there was no hemodynamically significant impingement on the left common iliac origin, a repeat pressure gradient was measured across the left common iliac artery.  The left common femoral 6-French sheath was removed and exchanged for a long 7-French sheath which was advanced across the left common iliac artery origin.  The sheath then was withdrawn with pressure gradient measured, and there was no significant drop of pressure across the left common iliac artery origin.  This revealed no significant impingement upon the left common iliac origin.  The patient tolerated the procedure well.  The guide wires were removed.  The patient was transferred to the holding area for planned removal of bilateral common femoral sheaths.  It should be noted prior to any intervention, the patient did receive 3000 units of heparin intravenously.  Also during the procedure, the patient received a total of 15 mg of labetalol intravenously for control of hypertension.  FINAL IMPRESSION: 1. Hemodynamically significant right common iliac  artery stenosis. 2. Successful percutaneous transluminal angioplasty and stent deployment     without evidence of residual stenosis at right common iliac artery origin.  DISPOSITION:  The patient will be admitted overnight for observation.  Will be begun on 75 mg of Plavix, ASA 81 mg daily x one month then to be converted to 325 mg of aspirin daily.  The results of this have been discussed with the patient and her daughter. Plans at this time will be to allow her to continue to convalesce from recent coronary artery surgery and evaluate her claudication symptoms following adequate recovery. DD:  08/11/00 TD:  08/11/00 Job: [id number]

## 2011-04-16 NOTE — Op Note (Signed)
[place]. [facility]  Patient:    [name], [name]                      MRN: [id number] Proc. Date: 01/06/01 Adm. Date:  [id number] Attending:  Melvenia Needles CC:         Gordy Savers, M.D.  Lewayne Bunting, M.D.   Operative Report  PREOPERATIVE DIAGNOSIS:  Recurrent right lower extremity claudication.  POSTOPERATIVE DIAGNOSIS:  Recurrent right lower extremity claudication.  PROCEDURE:  Redo right femoral-popliteal bypass with 6 mm stretch Gore-Tex graft.  SURGEON:  Denman George, M.D.  ASSISTANTS:  Norton Blizzard, M.D., and [facility]  ANESTHESIA:  General endotracheal.  ANESTHESIOLOGIST:  Guadalupe Maple, M.D.  CLINICAL NOTE:  [name] is a 70 year old female with a history of peripheral vascular disease and coronary artery disease.  She has previously undergone coronary artery bypass.  She also has a history of a right femoral-popliteal bypass carried out in June 2000.  She developed occlusion of her right femoral-popliteal bypass and recurrent right lower extremity claudication.  Arteriography verified occlusion of the right femoral-popliteal bypass graft and evidence of right common iliac stenosis.  She is status post right common iliac PTA with stent placement August 11, 2000.  She was brought to the operating room at this time for redo right femoral- popliteal bypass.  DESCRIPTION OF PROCEDURE:  Patient brought to the operating room in stable condition.  Placed in a supine position.  General endotracheal anesthesia induced.  Right leg prepped and draped in a sterile fashion.  Longitudinal skin incision made through the scar on the right groin. Dissection carried through the subcutaneous tissue and lymphatics with electrocautery.  The occluded proximal anastomosis of the right femoral-popliteal bypass was identified.  The common femoral artery was mobilized proximally and the inguinal ligament freed  and mobilized.  The external iliac artery was encircled proximally with a vessel loop.  The superficial femoral and profunda femoris artery origins were also encircled with vessel loops.  The right external iliac artery was noted to be moderately diseased, and a strong pulse was evident.  The right external iliac artery also noted to be small in caliber.  A second skin incision was then made in the distal calf through the scar in the sartorial groove.  Dissection carried through the subcutaneous tissue. Sartorius muscle reflected anteriorly.  The distal anastomosis of the occluded femoral-popliteal graft was identified.  The anastomosis was dissected out and the popliteal artery encircled proximal and distal to this.  A tunnel was then created along the subsartorial plane and a 6 mm stretch Gore-Tex graft placed through the tunnel.  The patient then administered 5000 units of heparin intravenously.  Adequate circulation time permitted  The right femoral vessels were controlled with clamps.  The proximal femoral anastomosis was taken down.  The right common femoral artery noted to be significantly diseased and small in caliber.  Inflow was adequate.  The arteriotomy was extended proximally up into the external iliac-common femoral junction.  The Gore-Tex graft was beveled and anastomosed end-to-side to the right common femoral artery with running 6-0 Prolene suture.  At completion of this, the femoral vessels were flushed and the graft controlled with a fistula clamp.  The popliteal artery was then controlled proximal and distal to the anastomosis.  The popliteal anastomosis taken down.  The arteriotomy extended out onto the popliteal artery.  This easily accepted the 3 dilator.  There  was disease throughout the vessel.  The Gore-Tex graft was beveled and anastomosed end-to-side to the right popliteal artery with a running 6-0 Prolene suture. At completion of the anastomosis, adequate  flushing was carried out.  Clamps were then removed.  Excellent flow present.  An on-table arteriogram was obtained with injection of 20 cc of contrast solution.  This verified brisk flow through the graft into the above-knee popliteal segment.  There was some spasm in the popliteal artery.  No evidence of flap or technical error at the distal anastomosis.  Adequate hemostasis was obtained.  Sponge and instrument counts were correct. Wounds were irrigated with antibiotic solution.  The right groin wound was closed with a deep layer of running 2-0 Vicryl suture.  Superficial layer of running 3-0 Vicryl suture.  Staples applied to the skin.  The sartorius fascia was closed with interrupted 2-0 Vicryl suture. Subcutaneous tissue closed with running 3-0 Vicryl suture.  Staples applied to the skin.  The patient tolerated the procedure well and was transferred to the recovery room in stable condition. DD:  01/08/01 TD:  01/09/01 Job: [id number]

## 2011-04-16 NOTE — Discharge Summary (Signed)
. [facility]  Patient:    [name], [name]                      MRN: [id number] Adm. Date:  [id number] Disc. Date: 09/05/00 Attending:  Colon Branch Dictator:   Delton See, P.A. CC:         Gordy Savers, M.D.  Denman George, M.D.   Referring Physician Discharge [place]  DATE OF BIRTH:  09-Apr-1941  HISTORY ON ADMISSION:  [name] is a 70 year old female, status post coronary artery bypass graft surgery performed by Dr. Laneta Simmers in July 2001. She is also status post left fem-pop performed by Dr. Madilyn Fireman.  Two weeks ago, she had a stent placed to her aorta at [facility] by Dr. Madilyn Fireman.  The patient does not know whether or not she has renal artery stenosis.  The patient was admitted to [facility] through the emergency room on September 04, 2000 by Dr. Eden Emms after she developed a smothering feeling while at rest.  The patient went to the fire station.  She was noted to have an elevated blood pressure of 200 systolic.  Apparently, she had recently had her Altace dose increased to 10 mg daily.  When seen in the emergency room by Dr. Eden Emms, her pressure was 180/90.  She had no signs or symptoms of congestive heart failure, no angina, although her presentation prior to her coronary artery bypass graft surgery was similar to her presentation when seen in the ER by Dr. Eden Emms.  She was admitted for further evaluation.  PAST MEDICAL HISTORY:  Please see above.  Patient had a history of tobacco use.  She quit smoking following her surgery.  ALLERGIES:  CODEINE, which causes stomach upset.  MEDICATIONS AT TIME OF ADMISSION: 1. Toprol XL 25 mg daily. 2. Aspirin 81 mg daily. 3. Plavix 75 mg daily until October 14. 4. Altace 10 mg daily.  HOSPITAL COURSE:  As noted, this patient was admitted to [facility] for further evaluation of a smothering feeling.  She was noted to be hypertensive at time of  admission.  Her Toprol was increased from 25 mg per day to 50 mg per day, and Norvasc was added by Dr. Eden Emms.  She was scheduled for an adenosine Cardiolite.  Her cardiac enzymes were found to be negative. She underwent the adenosine Cardiolite testing on September 05, 2000.  This was negative for ischemia, with an EF of 69%.  Arrangements were made to discharge the patient later that day with early follow-up in the office for further evaluation of blood pressure.  LABORATORY DATA:  Cardiac enzymes were negative.  A basic metabolic panel was within normal limits with BUN 14, creatinine 0.7, potassium 3.9, glucose 109. A CBC revealed mild anemia, with hemoglobin 11.5, hematocrit 32.8, WBC 6000. Platelets 218,000.  A chest x-ray showed no acute process.  An EKG showed normal sinus rhythm with ST abnormalities, possible ______ , no obvious ischemia.  DISCHARGE MEDICATIONS: 1. Altace 10 mg daily. 2. Toprol XL 50 mg daily. 3. Aspirin 81 mg daily. 4. Plavix 75 mg daily.  ACTIVITY:  As tolerated.  DIET:  Patient was told to stay on a low salt, low fat diet.  FOLLOW-UP:  She was to see Dr. Amador Cunas as needed or as scheduled.  She would follow up in the [place] office on Monday, October 15 with a physician assistant at 2:15 p.m. to have her blood  pressure checked.  She would see Dr. Andee Lineman Thursday, October 25 at 3:15 p.m.  PROBLEM LIST AT TIME OF DISCHARGE: 1. Chest pain - negative enzymes. 2. Adenosine Cardiolite on September 05, 2000 revealing no ischemia, ejection    fraction 69%. 3. Hypertension - medications adjusted this admission, with early follow-up    recommended. 4. History of coronary artery bypass graft surgery in July 2001. 5. Status post left fem-pop bypass. 6. History of stent to the aorta approximately two weeks ago at [facility], performed by Dr. Madilyn Fireman. 7. Mild anemia. DD:  09/05/00 TD:  09/05/00 Job: [id number]

## 2011-04-16 NOTE — Op Note (Signed)
NAME:  [name], [name]                         ACCOUNT NO.:  [id number]   MEDICAL RECORD NO.:  [id number]                   PATIENT TYPE:  INP   LOCATION:  2304                                 FACILITY:  [facility]   PHYSICIAN:  Balinda Quails, M.D.                 DATE OF BIRTH:  11/29/1941   DATE OF PROCEDURE:  10/10/2003  DATE OF DISCHARGE:                                 OPERATIVE REPORT   SURGEON:  Balinda Quails, M.D.   ASSISTANTS:  Di Kindle. Edilia Bo, M.D. and [facility]   ANESTHETIC:  General endotracheal.   ANESTHESIOLOGIST:  Edwards.   PREOPERATIVE DIAGNOSIS:  Iliac occlusive disease.   POSTOPERATIVE DIAGNOSIS:  Iliac occlusive disease.   PROCEDURE:  1. Aortobifemoral bypass.  2. Bilateral profundoplasty.  3. Reimplantation of inferior mesenteric artery.   CLINICAL NOTE:  [name] is a 70 year old female with a history of  coronary artery disease and peripheral vascular disease.  She has previously  undergone a number of right lower extremity revascularizations with femoral  popliteal bypass.  All these grafts have failed, and she recently presented  with increasing symptoms of ischemia in her right lower extremity.  She  underwent an arteriogram revealing occlusion of the right external iliac  artery and advanced aortoiliac disease.  She was brought to the operating  room at this time for elective aortobifemoral bypass for limb salvage.  The  risks of this operative procedure were a major morbidity and mortality of  approximately 3-5%, discussed with the patient.  The risks include but are  not limited to limb loss, MI, CVA, renal failure, transfusion risk,  bleeding, infection, and death.   OPERATIVE PROCEDURE:  The patient was brought to the operating room in  stable condition.  Placed in the supine position.  General endotracheal  anesthesia induced.  Foley catheter, arterial line, Swan-Ganz catheter in  place.  The abdomen and both legs  were prepped and draped in a sterile  fashion.   Bilateral longitudinal groin skin incisions were made.   In the right groin the incision was made through the scar at the site of  previous femoral popliteal bypass.  The subcutaneous tissue was adequately  divided with electrocautery.  Dissection carried down to expose an occluded  right femoral popliteal Gore-Tex graft.  The common femoral artery was  dissected out.  This was severely diseased.  Reviewing the arteriogram,  there was single run-off through the profunda femoris.  The superficial  femoral artery was mobilized and retracted medially and the profunda femoris  artery dissected out.  The profunda vein ligated with 2-0 silk and divided.  The proximal profunda femoris artery encircled with a vessel loop.   In the left groin, a longitudinal skin incision was made at the inguinal  ligament.  The subcutaneous tissue and the fat was divided with  electrocautery.  Dissection was carried down to expose  the common femoral  artery.  This was severely diseased.  The superficial femoral artery origin  was occluded.  The profunda femoris was also exposed and freed and encircled  with a vessel loop.   A longitudinal skin incision was made from xiphoid to umbilicus.  Dissected  carried down through the subcutaneous tissue.  Linea alba incised.  Peritoneal cavity entered without difficulty.   Full laparotomy evaluation carried out.  The stomach and duodenum were  normal.  The liver, gallbladder, bile duct, pancreas unremarkable.   The small bowel was normal.  Large bowel normal.   The pelvis revealed a small uterus, streaky small ovaries and normal  fallopian tubes which had been previously ligated.   The small bowel was retracted to the right, the transverse colon brought  superiorly.  The infrarenal aorta exposed through the retroperitoneum.  The  inferior mesenteric vein ligated with 2-0 silk and divided.  The infrarenal  aortic was  dissected up to the left renal vein, which was skeletonized and  retracted.  The entire length of the infrarenal aorta was heavily calcified.  The initial plan was to perform an end-to-side aortobifemoral bypass;  however, the infrarenal aorta was so severely diseased an end-to-side  procedure could not be carried out.  The patient does have bilateral  external iliac disease, and due to concern for potential left groin  ischemia, the inferior mesenteric artery was dissected out of its origin,  freed, and encircled with a vessel loop for planned reimplantation.  The  infrarenal aorta followed up to the renal artery origin bilaterally.  Clear  at this level, but there was still considerable plaque.  The suprarenal  aorta was cleared up to the superior mesenteric artery origin; however,  there was still a long segment of plaque extending up to this level.   The retroperitoneum was incised down to the aortic bifurcation.  The common  iliac arteries bilaterally were exposed and tunneling carried along the  iliac vessels to the groins bilaterally.   The patient was administered 25 g of Mannitol intravenously and 5000 units  of Heparin intravenously.  Adequate circulation time permitted.  The  infrarenal aorta controlled with an aortic DeBakey clamp.  The distal  infrarenal aorta controlled at the bifurcation with a second aortic DeBakey  clamp.  The infrarenal aorta divided transversely.  There was heavily  calcified plaque present.  This was endarterectomized from the distal  segment and the aortic stump was oversewn with running 4-0 Prolene suture in  two layers.  The distal clamp was then removed.   The proximal aorta was then endarterectomized up to the level of the renal  arteries.  A 14 x 7 bifurcated Hemashield graft was anastomosed end-to-end  to the infrarenal aorta with running 4-0 Prolene suture.  At the completion of this the graft was flushed and each limb of the graft controlled  with a  Fogarty clamp.   Each limb of the graft was then tunneled to the appropriate groin.  The  profunda femoris artery was controlled bilaterally with clamps and a  longitudinal arteriotomy was made in the proximal profunda femoris artery.  Each limb of the graft was anastomosed end-to-side to the proximal profunda  femoris artery with running 6-0 Prolene suture.  At the completion of each  profundoplasty, the vessel and graft were flushed and each leg reperfused.   Attention was then placed on the inferior mesenteric artery.  This was  ligated at its origin with 2-0 silk  and divided.  It was beveled.  The left  limb of the graft was controlled proximally and distally with peripheral  DeBakey clamps and a small incision and side punch were used to open the  graft.  The inferior mesenteric artery was reimplanted into the left limb of  the aortobifemoral graft in end-to-side fashion with running 6-0 Prolene  suture.  At the completion of this anastomosis, the clamps were removed and  excellent flow present and the inferior mesenteric artery patent.   The patient tolerated the procedure well.  Administered 50 mg of Protamine  intravenously.  Adequate hemostasis obtained.  Sponge, needle, and  instrument counts correct.  The groin was irrigated with antibiotic  solution.   The retroperitoneum was then closed over the graft with running 3-0 Vicryl  suture.  The midline fascia was closed with running #1 PDS suture.  Staples  were applied to the skin.  Both groin incisions were closed with two layers  of running 2-0 Vicryl suture in the subcutaneous tissue.  Staples applied to  the skin.   The patient tolerated the procedure well.  Transferred directly to the  surgical Intensive Care Unit.  Hemodynamically stable.  No apparent  intraoperative complications.                                               Balinda Quails, M.D.    PGH/MEDQ  D:  10/10/2003  T:  10/11/2003  Job:   [id number]

## 2011-05-13 ENCOUNTER — Ambulatory Visit (INDEPENDENT_AMBULATORY_CARE_PROVIDER_SITE_OTHER): Payer: Medicare Other | Admitting: Internal Medicine

## 2011-05-13 ENCOUNTER — Encounter: Payer: Self-pay | Admitting: Internal Medicine

## 2011-05-13 DIAGNOSIS — M1990 Unspecified osteoarthritis, unspecified site: Secondary | ICD-10-CM

## 2011-05-13 DIAGNOSIS — I10 Essential (primary) hypertension: Secondary | ICD-10-CM

## 2011-05-13 DIAGNOSIS — E785 Hyperlipidemia, unspecified: Secondary | ICD-10-CM

## 2011-05-13 DIAGNOSIS — I2510 Atherosclerotic heart disease of native coronary artery without angina pectoris: Secondary | ICD-10-CM

## 2011-05-13 NOTE — Progress Notes (Signed)
  Subjective:    Patient ID: [name], female    DOB: Feb 16, 1941, 70 y.o.   MRN: [id number]  Hypertension Pertinent negatives include no chest pain, headaches, palpitations or shortness of breath.  Hyperlipidemia Pertinent negatives include no chest pain or shortness of breath.  Coronary Artery Disease Pertinent negatives include no chest pain, dizziness, leg swelling, palpitations or shortness of breath. Risk factors include hyperlipidemia and hypertension.    70 year old patient who is in today for followup of her hypertension. She has dyslipidemia that has been well-controlled on simvastatin. She has coronary artery disease as well as peripheral vascular disease that have been stable. Complaints include bilateral breast tenderness left greater than the right. Approximately one year ago she had a breast biopsy performed. She did have a mammogram performed in October of last year. She denies any cardiopulmonary complaints. She has been compliant with her medication she has refills on file. She has significant arthritis that has been stable;  she continues to work full-time. As he plans an increase in discomfort from the left breast area    Review of Systems  Constitutional: Negative.   HENT: Negative for hearing loss, congestion, sore throat, rhinorrhea, dental problem, sinus pressure and tinnitus.   Eyes: Negative for pain, discharge and visual disturbance.  Respiratory: Negative for cough and shortness of breath.   Cardiovascular: Negative for chest pain, palpitations and leg swelling.  Gastrointestinal: Negative for nausea, vomiting, abdominal pain, diarrhea, constipation, blood in stool and abdominal distention.  Genitourinary: Negative for dysuria, urgency, frequency, hematuria, flank pain, vaginal bleeding, vaginal discharge, difficulty urinating, vaginal pain and pelvic pain.  Musculoskeletal: Negative for joint swelling, arthralgias and gait problem.  Skin: Negative for rash.    Neurological: Negative for dizziness, syncope, speech difficulty, weakness, numbness and headaches.  Hematological: Negative for adenopathy.  Psychiatric/Behavioral: Negative for behavioral problems, dysphoric mood and agitation. The patient is not nervous/anxious.        Objective:   Physical Exam  Constitutional: She is oriented to person, place, and time. She appears well-developed and well-nourished.  HENT:  Head: Normocephalic.  Right Ear: External ear normal.  Left Ear: External ear normal.  Mouth/Throat: Oropharynx is clear and moist.  Eyes: Conjunctivae and EOM are normal. Pupils are equal, round, and reactive to light.  Neck: Normal range of motion. Neck supple. No thyromegaly present.       Bilateral soft carotid and supraclavicular bruits  Cardiovascular: Normal rate, regular rhythm, normal heart sounds and intact distal pulses.   Pulmonary/Chest: Effort normal and breath sounds normal.       Patient had inverted left nipple there is some fullness and nodularity in the subareolar area  Abdominal: Soft. Bowel sounds are normal. She exhibits no mass. There is no tenderness.  Musculoskeletal: Normal range of motion.  Lymphadenopathy:    She has no cervical adenopathy.  Neurological: She is alert and oriented to person, place, and time.  Skin: Skin is warm and dry. No rash noted.  Psychiatric: She has a normal mood and affect. Her behavior is normal.          Assessment & Plan:  Coronary artery disease. Clinically stable Hypertension well controlled Dyslipidemia stable Left breast discomfort and subareolar fullness. Will refer back to the breast Center for repeat mammogram and consideration of repeat biopsy

## 2011-05-13 NOTE — Patient Instructions (Signed)
Limit your sodium (Salt) intake    It is important that you exercise regularly, at least 20 minutes 3 to 4 times per week.  If you develop chest pain or shortness of breath seek  medical attention.  Return in 3 months for follow-up  [facility]  referral as discussed

## 2011-05-24 ENCOUNTER — Encounter: Payer: Self-pay | Admitting: Critical Care Medicine

## 2011-05-25 ENCOUNTER — Ambulatory Visit (INDEPENDENT_AMBULATORY_CARE_PROVIDER_SITE_OTHER)
Admission: RE | Admit: 2011-05-25 | Discharge: 2011-05-25 | Disposition: A | Discharge status: Home / Self Care | Payer: Medicare Other | Source: Ambulatory Visit | Attending: Critical Care Medicine | Admitting: Critical Care Medicine

## 2011-05-25 ENCOUNTER — Ambulatory Visit (INDEPENDENT_AMBULATORY_CARE_PROVIDER_SITE_OTHER): Payer: Medicare Other | Admitting: Critical Care Medicine

## 2011-05-25 ENCOUNTER — Encounter: Payer: Self-pay | Admitting: Internal Medicine

## 2011-05-25 ENCOUNTER — Ambulatory Visit: Payer: Medicare Other | Admitting: Critical Care Medicine

## 2011-05-25 ENCOUNTER — Encounter: Payer: Self-pay | Admitting: Critical Care Medicine

## 2011-05-25 VITALS — BP 142/64 | HR 87 | Temp 97.8°F | Ht 62.5 in | Wt 145.8 lb

## 2011-05-25 DIAGNOSIS — J852 Abscess of lung without pneumonia: Secondary | ICD-10-CM

## 2011-05-25 DIAGNOSIS — J4489 Other specified chronic obstructive pulmonary disease: Secondary | ICD-10-CM

## 2011-05-25 DIAGNOSIS — J449 Chronic obstructive pulmonary disease, unspecified: Secondary | ICD-10-CM

## 2011-05-25 MED ORDER — TIOTROPIUM BROMIDE MONOHYDRATE 18 MCG IN CAPS: 18.0000 ug | ORAL_CAPSULE | Freq: Every day | RESPIRATORY_TRACT | Status: DC

## 2011-05-25 NOTE — Progress Notes (Signed)
Subjective:    Patient ID: [name], female    DOB: Jan 11, 1941, 70 y.o.   MRN: [id number]  HPI 70 yo WF with RML lung abscess no other risk factors and neg FOB for endobronchial lesion 9/11. Mild COPD ex smoker 60pack years. Normal FeV1, Fef25-75 39% predicted  November 25, 2010 10:27 AM  The pt is now noting more dyspnea times two weeks when walking up stairs. The pt had a "cold" two weeks ago and is worse since that time. There is no cough now. No mucus. No fever.  No real chest pain. There is no wheezing. Pt was on advair in the past but is now off . The pt does have proair and uses this as needed . There is no recurrent chest pain such as she had with lung abscess.   05/25/2011 Did well until past few months gets dyspneic with exertion.  One week ago over weekend, throat burning sensation.  In the am, will cough up mucus.  Mucus was a light color.  Sl tint of green.   No chest pain.  No real fever.  No swelling in legs.   Notes more dyspnea if out walking or active.  Past Medical History  Diagnosis Date  . ANEMIA 08/28/2010  . 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 COLONIC POLYPS, HX OF 06/15/2007  . 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 CORONARY ARTERY DISEASE 06/15/2007  . DEPRESSIVE DISORDER 06/06/2008  . GERD 12/19/2009  . HYPERLIPIDEMIA 06/15/2007  . HYPERTENSION 06/15/2007  . HYPOTENSION 08/11/2010  . OSTEOARTHRITIS 06/15/2007  . PEDAL EDEMA 06/06/2009  . PERIPHERAL VASCULAR DISEASE 06/15/2007  . Lumbar stenosis   . Lung abscess 2011     No family history on file.   History   Social History  . Marital Status: Widowed    Spouse Name: N/A    Number of Children: N/A  . Years of Education: N/A   Occupational History  . Not on file.   Social History Main Topics  . Smoking status: Former Smoker -- 1.5 packs/day for 40 years    Types: Cigarettes    Quit date: 06/29/2000  . Smokeless tobacco: Never Used  . Alcohol Use: No  . Drug Use: No  . Sexually Active: Not on file   Other  Topics Concern  . Not on file   Social History Narrative  . No narrative on file     Allergies  Allergen Reactions  . Codeine Phosphate     REACTION: unspecified     Outpatient Prescriptions Prior to Visit  Medication Sig Dispense Refill  . acetaminophen (TYLENOL) 325 MG tablet Take 650 mg by mouth every 6 (six) hours as needed.        Marland Kitchen albuterol (PROAIR HFA) 108 (90 BASE) MCG/ACT inhaler Inhale 2 puffs into the lungs every 6 (six) hours as needed.        Marland Kitchen aspirin 81 MG tablet Take 81 mg by mouth daily.        . chlorthalidone (HYGROTON) 25 MG tablet Take 25 mg by mouth daily.        . cilostazol (PLETAL) 100 MG tablet Take 1 tablet (100 mg total) by mouth 2 (two) times daily.  180 tablet  1  . felodipine (PLENDIL) 5 MG 24 hr tablet Take 5 mg by mouth daily.        . fexofenadine (ALLEGRA) 180 MG tablet Take 180 mg by mouth daily as needed.        Marland Kitchen  furosemide (LASIX) 40 MG tablet TAKE 1 TABLET BY MOUTH AS NEEDED TO CONTROL PERIPHEARL EDEMA  90 tablet  0  . gabapentin (NEURONTIN) 600 MG tablet Take 600 mg by mouth 3 (three) times daily.        Marland Kitchen HYDROcodone-acetaminophen (VICODIN) 5-500 MG per tablet Take 0.5 tablets by mouth every 4 (four) hours as needed for pain. 1/2 tablet six times daily  90 tablet  3  . isosorbide mononitrate (IMDUR) 30 MG 24 hr tablet Take 30 mg by mouth daily.        Marland Kitchen LORazepam (ATIVAN) 0.5 MG tablet Take 1 tablet (0.5 mg total) by mouth 2 (two) times daily.  50 tablet  2  . Multiple Vitamin (MULTIVITAMIN) tablet Take 1 tablet by mouth daily.        . Omega-3 Fatty Acids (FISH OIL) 1000 MG CAPS Take 1 capsule by mouth daily.        . promethazine (PHENERGAN) 25 MG tablet TAKE ONE TABLET EVERY 6 HOURS FOR NAUSEA  30 tablet  0  . ramipril (ALTACE) 5 MG capsule Take 5 mg by mouth daily.        . sertraline (ZOLOFT) 50 MG tablet Take 50 mg by mouth daily.        . simvastatin (ZOCOR) 80 MG tablet Take 80 mg by mouth daily.        Marland Kitchen omeprazole (PRILOSEC) 20 MG  capsule TAKE ONE CAPSULE BY MOUTH TWICE A DAY  180 capsule  3  . diclofenac sodium (VOLTAREN) 1 % GEL Apply topically.            Review of Systems Constitutional:   No  weight loss, night sweats,  Fevers, chills, fatigue, lassitude. HEENT:   No headaches,  Difficulty swallowing,  Tooth/dental problems,  Sore throat,                No sneezing, itching, ear ache, nasal congestion, post nasal drip,   CV:  No chest pain,  Orthopnea, PND, swelling in lower extremities, anasarca, dizziness, palpitations  GI  Notes  heartburn, notes indigestion, abdominal pain, nausea, vomiting, diarrhea, change in bowel habits, loss of appetite  Resp: Notes  shortness of breath with exertion not at rest.  No excess mucus, notes  productive cough,  No non-productive cough,  No coughing up of blood.  No change in color of mucus.  No wheezing.  No chest wall deformity  Skin: no rash or lesions.  GU: no dysuria, change in color of urine, no urgency or frequency.  No flank pain.  MS:  No joint pain or swelling.  No decreased range of motion.  No back pain.  Psych:  No change in mood or affect. No depression or anxiety.  No memory loss.     Objective:   Physical Exam Filed Vitals:   05/25/11 0901  BP: 142/64  Pulse: 87  Temp: 97.8 F (36.6 C)  TempSrc: Oral  Height: 5' 2.5" (1.588 m)  Weight: 145 lb 12.8 oz (66.134 kg)  SpO2: 95%    Gen: Pleasant, well-nourished, in no distress,  normal affect  ENT: No lesions,  mouth clear,  oropharynx clear, no postnasal drip  Neck: No JVD, no TMG, no carotid bruits  Lungs: No use of accessory muscles, no dullness to percussion,distant BS  Cardiovascular: RRR, heart sounds normal, no murmur or gallops, no peripheral edema  Abdomen: soft and NT, no HSM,  BS normal  Musculoskeletal: No deformities, no cyanosis or clubbing  Neuro: alert, non focal  Skin: Warm, no lesions or rashes     6/26 CXR: resolved RLL abscess, smaller cavity   Assessment &  Plan:   COPD, MILD Mild progression in airflow obstruction,  Lung abscess has resolved, ex smoker but GERD likely ppt factor  Plan Follow reflux diet Start Spiriva daily  Proair as needed Return 4 months    Lung abscess RLL lung abscess Now resolved on CXR 6/26    Updated Medication List Outpatient Encounter Prescriptions as of 05/25/2011  Medication Sig Dispense Refill  . acetaminophen (TYLENOL) 325 MG tablet Take 650 mg by mouth every 6 (six) hours as needed.        Marland Kitchen albuterol (PROAIR HFA) 108 (90 BASE) MCG/ACT inhaler Inhale 2 puffs into the lungs every 6 (six) hours as needed.        Marland Kitchen aspirin 81 MG tablet Take 81 mg by mouth daily.        . chlorthalidone (HYGROTON) 25 MG tablet Take 25 mg by mouth daily.        . cilostazol (PLETAL) 100 MG tablet Take 1 tablet (100 mg total) by mouth 2 (two) times daily.  180 tablet  1  . felodipine (PLENDIL) 5 MG 24 hr tablet Take 5 mg by mouth daily.        . fexofenadine (ALLEGRA) 180 MG tablet Take 180 mg by mouth daily as needed.        . furosemide (LASIX) 40 MG tablet TAKE 1 TABLET BY MOUTH AS NEEDED TO CONTROL PERIPHEARL EDEMA  90 tablet  0  . gabapentin (NEURONTIN) 600 MG tablet Take 600 mg by mouth 3 (three) times daily.        Marland Kitchen HYDROcodone-acetaminophen (VICODIN) 5-500 MG per tablet Take 0.5 tablets by mouth every 4 (four) hours as needed for pain. 1/2 tablet six times daily  90 tablet  3  . isosorbide mononitrate (IMDUR) 30 MG 24 hr tablet Take 30 mg by mouth daily.        Marland Kitchen LORazepam (ATIVAN) 0.5 MG tablet Take 1 tablet (0.5 mg total) by mouth 2 (two) times daily.  50 tablet  2  . Multiple Vitamin (MULTIVITAMIN) tablet Take 1 tablet by mouth daily.        . Omega-3 Fatty Acids (FISH OIL) 1000 MG CAPS Take 1 capsule by mouth daily.        Marland Kitchen omeprazole (PRILOSEC) 20 MG capsule        . promethazine (PHENERGAN) 25 MG tablet TAKE ONE TABLET EVERY 6 HOURS FOR NAUSEA  30 tablet  0  . ramipril (ALTACE) 5 MG capsule Take 5 mg by mouth  daily.        . sertraline (ZOLOFT) 50 MG tablet Take 50 mg by mouth daily.        . simvastatin (ZOCOR) 80 MG tablet Take 80 mg by mouth daily.        Marland Kitchen trolamine salicylate (ASPERCREME) 10 % cream Apply topically daily.        Marland Kitchen DISCONTD: omeprazole (PRILOSEC) 20 MG capsule TAKE ONE CAPSULE BY MOUTH TWICE A DAY  180 capsule  3  . tiotropium (SPIRIVA HANDIHALER) 18 MCG inhalation capsule Place 1 capsule (18 mcg total) into inhaler and inhale daily.  30 capsule  6  . DISCONTD: diclofenac sodium (VOLTAREN) 1 % GEL Apply topically.

## 2011-05-25 NOTE — Patient Instructions (Signed)
Follow reflux diet Start Spiriva daily  Proair as needed Your abscess has resolved Return 4 months

## 2011-05-26 NOTE — Assessment & Plan Note (Signed)
RLL lung abscess Now resolved on CXR 6/26

## 2011-05-26 NOTE — Assessment & Plan Note (Signed)
Mild progression in airflow obstruction,  Lung abscess has resolved, ex smoker but GERD likely ppt factor  Plan Follow reflux diet Start Spiriva daily  Proair as needed Return 4 months

## 2011-06-08 ENCOUNTER — Encounter: Payer: Self-pay | Admitting: Cardiology

## 2011-06-25 ENCOUNTER — Other Ambulatory Visit: Payer: Self-pay | Admitting: Internal Medicine

## 2011-07-13 ENCOUNTER — Other Ambulatory Visit: Payer: Self-pay

## 2011-07-13 MED ORDER — LORAZEPAM 0.5 MG PO TABS: 0.5000 mg | ORAL_TABLET | Freq: Two times a day (BID) | ORAL | Status: DC

## 2011-07-13 NOTE — Telephone Encounter (Signed)
Faxed back to [facility] 

## 2011-07-15 ENCOUNTER — Ambulatory Visit: Payer: Medicare Other | Admitting: Cardiology

## 2011-07-29 ENCOUNTER — Telehealth: Payer: Self-pay | Admitting: Critical Care Medicine

## 2011-07-29 MED ORDER — IPRATROPIUM BROMIDE HFA 17 MCG/ACT IN AERS: 2.0000 | INHALATION_SPRAY | Freq: Three times a day (TID) | RESPIRATORY_TRACT | Status: DC

## 2011-07-29 NOTE — Telephone Encounter (Signed)
Try atrovent HFA  2 puff three times a day

## 2011-07-29 NOTE — Telephone Encounter (Signed)
Rx has been sent and pt aware.

## 2011-07-29 NOTE — Telephone Encounter (Signed)
Spoke with pt and she states she would like an alternative to the spiriva. Pt states it would cost her $250 and she can't afford that. Please advise Dr. Delford Field. Thanks  Allergies  Allergen Reactions  . Codeine Phosphate     REACTION: unspecified    Carver Fila, CMA

## 2011-08-13 ENCOUNTER — Encounter: Payer: Self-pay | Admitting: Cardiology

## 2011-08-13 ENCOUNTER — Ambulatory Visit (INDEPENDENT_AMBULATORY_CARE_PROVIDER_SITE_OTHER): Payer: Medicare Other | Admitting: Cardiology

## 2011-08-13 VITALS — BP 128/64 | HR 88 | Ht 62.5 in | Wt 149.0 lb

## 2011-08-13 DIAGNOSIS — I25810 Atherosclerosis of coronary artery bypass graft(s) without angina pectoris: Secondary | ICD-10-CM

## 2011-08-13 DIAGNOSIS — I2510 Atherosclerotic heart disease of native coronary artery without angina pectoris: Secondary | ICD-10-CM

## 2011-08-13 DIAGNOSIS — I739 Peripheral vascular disease, unspecified: Secondary | ICD-10-CM

## 2011-08-13 DIAGNOSIS — E785 Hyperlipidemia, unspecified: Secondary | ICD-10-CM

## 2011-08-13 DIAGNOSIS — I10 Essential (primary) hypertension: Secondary | ICD-10-CM

## 2011-08-13 MED ORDER — NITROGLYCERIN 0.4 MG SL SUBL: 0.4000 mg | SUBLINGUAL_TABLET | SUBLINGUAL | Status: DC | PRN

## 2011-08-13 NOTE — Assessment & Plan Note (Signed)
Stable. Continued secondary prevention. Statin and aspirin emphasized.

## 2011-08-13 NOTE — Assessment & Plan Note (Signed)
Under good control. No change in treatment. 

## 2011-08-13 NOTE — Progress Notes (Signed)
Addended by: Mylo Red F on: 08/13/2011 09:38 AM   Modules accepted: Orders

## 2011-08-13 NOTE — Patient Instructions (Addendum)
Your physician recommends that you schedule a follow-up appointment in: 1 year with Dr. Daleen Squibb  Your physician has recommended you make the following change in your medication:  You have been prescribed Nitroglycerin

## 2011-08-13 NOTE — Progress Notes (Signed)
HPI [name]  Returns today for the evaluation and management of her diffuse vascular disease. She has a history of carotid artery disease, left subclavian disease ,aortic and iliac disease, and coronary disease. She is 11 years post bypass surgery for cardiac disease. He is a previous bypass of her left subclavian as well as aortobifemoral surgery. She is followed by Dr. Hart Rochester for her peripheral disease.  She's having no angina or ischemic symptoms. She denies any symptoms of TIAs.  She seems to be compliant with her medications. Medications reviewed and reinforced.  EKG shows normal sinus rhythm with an occasional PVC. No acute changes. Past Medical History  Diagnosis Date  . ANEMIA 08/28/2010  . 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 COLONIC POLYPS, HX OF 06/15/2007  . 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 CORONARY ARTERY DISEASE 06/15/2007  . DEPRESSIVE DISORDER 06/06/2008  . GERD 12/19/2009  . HYPERLIPIDEMIA 06/15/2007  . HYPERTENSION 06/15/2007  . HYPOTENSION 08/11/2010  . OSTEOARTHRITIS 06/15/2007  . PEDAL EDEMA 06/06/2009  . PERIPHERAL VASCULAR DISEASE 06/15/2007  . Lumbar stenosis   . Lung abscess 2011  . CHF (congestive heart failure)   . Hx of colonoscopy     Past Surgical History  Procedure Date  . L subclavian bypass 07/2004  . Tubal ligation   . Coronary artery bypass graft 05/2000  . Femoral popliteal bypass-r 04/1999  . Right common iliac pta with stent placement 07/2000  . Right leg blockage 2003  . Aorta bifemoral bypass graft 2004  . Left cea 2005  . Lung cyst biopsy 2011    Family History  Problem Relation Age of Onset  . Diabetes Father   . Heart attack Mother   . Diabetes    . Coronary artery disease    . Colon cancer Neg Hx     History   Social History  . Marital Status: Widowed    Spouse Name: N/A    Number of Children: 2  . 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 Smoking status: Former  Smoker -- 1.5 packs/day for 40 years    Types: Cigarettes    Quit date: 06/29/2000  . Smokeless tobacco: Never Used  . Alcohol Use: No  . Drug Use: No  . Sexually Active: Not on file   Other Topics Concern  . Not on file   Social History Narrative  . No narrative on file    Allergies  Allergen Reactions  . Codeine Phosphate     REACTION: unspecified    Current Outpatient Prescriptions  Medication Sig Dispense Refill  . acetaminophen (TYLENOL) 325 MG tablet Take 650 mg by mouth every 6 (six) hours as needed.        Marland Kitchen aspirin 81 MG tablet Take 81 mg by mouth daily.        . chlorthalidone (HYGROTON) 25 MG tablet Take 25 mg by mouth daily.        . cilostazol (PLETAL) 100 MG tablet Take 1 tablet (100 mg total) by mouth 2 (two) times daily.  180 tablet  1  . felodipine (PLENDIL) 5 MG 24 hr tablet TAKE 1 TABLET BY MOUTH EVERY DAY  90 tablet  0  . gabapentin (NEURONTIN) 600 MG tablet Take 600 mg by mouth 3 (three) times daily.        Marland Kitchen HYDROcodone-acetaminophen (VICODIN) 5-500 MG per tablet Take 0.5 tablets by mouth every 4 (four) hours as needed  for pain. 1/2 tablet six times daily  90 tablet  3  . ipratropium (ATROVENT HFA) 17 MCG/ACT inhaler Inhale 2 puffs into the lungs 3 (three) times daily.  1 Inhaler  4  . isosorbide mononitrate (IMDUR) 30 MG 24 hr tablet Take 30 mg by mouth daily.        Marland Kitchen LORazepam (ATIVAN) 0.5 MG tablet Take 1 tablet (0.5 mg total) by mouth 2 (two) times daily.  60 tablet  2  . Omega-3 Fatty Acids (FISH OIL) 1000 MG CAPS Take 1 capsule by mouth daily.        Marland Kitchen omeprazole (PRILOSEC) 20 MG capsule Take 20 mg by mouth 2 (two) times daily.       . promethazine (PHENERGAN) 25 MG tablet TAKE ONE TABLET EVERY 6 HOURS FOR NAUSEA  30 tablet  0  . ramipril (ALTACE) 5 MG capsule Take 5 mg by mouth daily.        . simvastatin (ZOCOR) 80 MG tablet Take 40 mg by mouth daily.         ROS Negative other than HPI.   PE General Appearance: well developed, well nourished  in no acute distress HEENT: symmetrical face, PERRLA, good dentition  Neck: no JVD, thyromegaly, or adenopathy, trachea midline Chest: symmetric without deformity Cardiac: PMI non-displaced, RRR, normal S1, S2, no gallop or murmur Lung: clear to ausculation and percussion Vascular: carotid bruits bilaterally, good distal pulses in her feet and upper extremities. Abdominal: nondistended, nontender, good bowel sounds, no HSM, no bruits Extremities: no cyanosis, clubbing or edema, no sign of DVT, no varicosities  Skin: normal color, no rashes Neuro: alert and oriented x 3, non-focal Pysch: normal affect Filed Vitals:   08/13/11 0857  BP: 128/64  Pulse: 88  Height: 5' 2.5" (1.588 m)  Weight: 149 lb (67.586 kg)    EKG  Labs and Studies Reviewed.   Lab Results  Component Value Date   WBC 9.5 08/28/2010   HGB 10.4* 08/28/2010   HCT 31.3* 08/28/2010   MCV 85.2 08/28/2010   PLT 160.0 08/28/2010      Chemistry      Component Value Date/Time   NA 142 08/24/2010 0259   K 3.6 DELTA CHECK NOTED 08/24/2010 0259   CL 105 08/24/2010 0259   CO2 27 08/24/2010 0259   BUN 11 08/24/2010 0259   CREATININE 1.13 08/24/2010 0259      Component Value Date/Time   CALCIUM 9.1 08/24/2010 0259   ALKPHOS 46 08/21/2010 1115   AST 19 08/21/2010 1115   ALT 12 08/21/2010 1115   BILITOT 0.7 08/21/2010 1115       Lab Results  Component Value Date   CHOL 145 09/01/2009   CHOL 181 02/12/2008   CHOL 124 10/31/2006   Lab Results  Component Value Date   HDL 49.20 09/01/2009   HDL 16.1 02/12/2008   HDL 45.5 10/31/2006   Lab Results  Component Value Date   LDLCALC 70 09/01/2009   Lab Results  Component Value Date   TRIG 127.0 09/01/2009   TRIG 243* 02/12/2008   Lab Results  Component Value Date   CHOLHDL 3 09/01/2009   CHOLHDL 3.6 CALC 02/12/2008   No results found for this basename: HGBA1C   Lab Results  Component Value Date   ALT 12 08/21/2010   AST 19 08/21/2010   ALKPHOS 46 08/21/2010   BILITOT 0.7  08/21/2010   Lab Results  Component Value Date   TSH 2.74 09/01/2009

## 2011-08-13 NOTE — Assessment & Plan Note (Signed)
LDL and aggressive goal.

## 2011-08-24 ENCOUNTER — Other Ambulatory Visit: Payer: Self-pay | Admitting: Internal Medicine

## 2011-08-25 ENCOUNTER — Other Ambulatory Visit: Payer: Self-pay | Admitting: Internal Medicine

## 2011-09-03 ENCOUNTER — Encounter (INDEPENDENT_AMBULATORY_CARE_PROVIDER_SITE_OTHER): Payer: Medicare Other | Admitting: *Deleted

## 2011-09-03 DIAGNOSIS — I6529 Occlusion and stenosis of unspecified carotid artery: Secondary | ICD-10-CM

## 2011-09-03 DIAGNOSIS — I2510 Atherosclerotic heart disease of native coronary artery without angina pectoris: Secondary | ICD-10-CM

## 2011-09-07 ENCOUNTER — Other Ambulatory Visit: Payer: Self-pay | Admitting: Internal Medicine

## 2011-09-09 ENCOUNTER — Other Ambulatory Visit (INDEPENDENT_AMBULATORY_CARE_PROVIDER_SITE_OTHER): Payer: Medicare Other

## 2011-09-09 DIAGNOSIS — E785 Hyperlipidemia, unspecified: Secondary | ICD-10-CM

## 2011-09-09 DIAGNOSIS — I2510 Atherosclerotic heart disease of native coronary artery without angina pectoris: Secondary | ICD-10-CM

## 2011-09-09 DIAGNOSIS — Z0000 Encounter for general adult medical examination without abnormal findings: Secondary | ICD-10-CM

## 2011-09-09 DIAGNOSIS — Z79899 Other long term (current) drug therapy: Secondary | ICD-10-CM

## 2011-09-09 DIAGNOSIS — I10 Essential (primary) hypertension: Secondary | ICD-10-CM

## 2011-09-09 LAB — CBC WITH DIFFERENTIAL/PLATELET
Basophils Absolute: 0 K/uL (ref 0.0–0.1)
Basophils Relative: 0.4 % (ref 0.0–3.0)
Eosinophils Absolute: 0.1 K/uL (ref 0.0–0.7)
Eosinophils Relative: 1.7 % (ref 0.0–5.0)
HCT: 36.4 % (ref 36.0–46.0)
Hemoglobin: 11.9 g/dL — ABNORMAL LOW (ref 12.0–15.0)
Lymphocytes Relative: 20 % (ref 12.0–46.0)
Lymphs Abs: 1.7 K/uL (ref 0.7–4.0)
MCHC: 32.7 g/dL (ref 30.0–36.0)
MCV: 88.4 fl (ref 78.0–100.0)
Monocytes Absolute: 0.5 K/uL (ref 0.1–1.0)
Monocytes Relative: 6.1 % (ref 3.0–12.0)
Neutro Abs: 6 K/uL (ref 1.4–7.7)
Neutrophils Relative %: 71.8 % (ref 43.0–77.0)
Platelets: 219 K/uL (ref 150.0–400.0)
RBC: 4.11 Mil/uL (ref 3.87–5.11)
RDW: 15 % — ABNORMAL HIGH (ref 11.5–14.6)
WBC: 8.4 K/uL (ref 4.5–10.5)

## 2011-09-09 LAB — BASIC METABOLIC PANEL WITH GFR
BUN: 17 mg/dL (ref 6–23)
CO2: 27 meq/L (ref 19–32)
Calcium: 10.1 mg/dL (ref 8.4–10.5)
Chloride: 107 meq/L (ref 96–112)
Creatinine, Ser: 1.2 mg/dL (ref 0.4–1.2)
GFR: 48.14 mL/min — ABNORMAL LOW
Glucose, Bld: 102 mg/dL — ABNORMAL HIGH (ref 70–99)
Potassium: 5.2 meq/L — ABNORMAL HIGH (ref 3.5–5.1)
Sodium: 146 meq/L — ABNORMAL HIGH (ref 135–145)

## 2011-09-09 LAB — LIPID PANEL
Cholesterol: 141 mg/dL (ref 0–200)
HDL: 61.7 mg/dL
LDL Cholesterol: 54 mg/dL (ref 0–99)
Total CHOL/HDL Ratio: 2
Triglycerides: 126 mg/dL (ref 0.0–149.0)
VLDL: 25.2 mg/dL (ref 0.0–40.0)

## 2011-09-09 LAB — HEPATIC FUNCTION PANEL
ALT: 19 U/L (ref 0–35)
AST: 23 U/L (ref 0–37)
Albumin: 4.1 g/dL (ref 3.5–5.2)
Alkaline Phosphatase: 68 U/L (ref 39–117)
Bilirubin, Direct: 0 mg/dL (ref 0.0–0.3)
Total Bilirubin: 0.5 mg/dL (ref 0.3–1.2)
Total Protein: 7.7 g/dL (ref 6.0–8.3)

## 2011-09-09 LAB — POCT URINALYSIS DIPSTICK
Blood, UA: NEGATIVE
Glucose, UA: NEGATIVE
Nitrite, UA: POSITIVE
Urobilinogen, UA: 1

## 2011-09-09 LAB — TSH: TSH: 1.07 u[IU]/mL (ref 0.35–5.50)

## 2011-09-16 ENCOUNTER — Ambulatory Visit (INDEPENDENT_AMBULATORY_CARE_PROVIDER_SITE_OTHER): Payer: Medicare Other | Admitting: Internal Medicine

## 2011-09-16 ENCOUNTER — Encounter: Payer: Self-pay | Admitting: Internal Medicine

## 2011-09-16 VITALS — BP 108/68 | HR 70 | Temp 97.8°F | Resp 18 | Ht 62.5 in | Wt 152.0 lb

## 2011-09-16 DIAGNOSIS — I10 Essential (primary) hypertension: Secondary | ICD-10-CM

## 2011-09-16 DIAGNOSIS — Z23 Encounter for immunization: Secondary | ICD-10-CM

## 2011-09-16 DIAGNOSIS — M1990 Unspecified osteoarthritis, unspecified site: Secondary | ICD-10-CM

## 2011-09-16 DIAGNOSIS — J449 Chronic obstructive pulmonary disease, unspecified: Secondary | ICD-10-CM

## 2011-09-16 DIAGNOSIS — Z0000 Encounter for general adult medical examination without abnormal findings: Secondary | ICD-10-CM

## 2011-09-16 DIAGNOSIS — E785 Hyperlipidemia, unspecified: Secondary | ICD-10-CM

## 2011-09-16 DIAGNOSIS — I2510 Atherosclerotic heart disease of native coronary artery without angina pectoris: Secondary | ICD-10-CM

## 2011-09-16 MED ORDER — CIPROFLOXACIN HCL 500 MG PO TABS: 500.0000 mg | ORAL_TABLET | Freq: Two times a day (BID) | ORAL | Status: AC

## 2011-09-16 MED ORDER — FELODIPINE ER 5 MG PO TB24: 5.0000 mg | ORAL_TABLET | Freq: Every day | ORAL | Status: DC

## 2011-09-16 NOTE — Progress Notes (Signed)
Subjective:    Patient ID: [name], female    DOB: 1941-09-04, 70 y.o.   MRN: [id number]  HPI   70 year old patient who is seen today for a wellness exam. She is followed by cardiology for coronary artery disease and also has a history of extensive peripheral vascular disease. She is followed also by pulmonary for COPD and a prior history of a lung abscess. She did have followup colonoscopy last year. She continues to work and does reasonably well. She does have arthritis. Medical problems include dyslipidemia treated hypertension gastroesophageal reflux disease and a history of anxiety disorder.  Past History:  Past Medical History:  CORONARY ARTERY DISEASE (ICD-414.00)  PERIPHERAL VASCULAR DISEASE (ICD-443.9)  HYPERTENSION (ICD-401.9)  HYPERLIPIDEMIA (ICD-272.4)  DEPRESSIVE DISORDER (ICD-311)  HEALTH SCREENING (ICD-V70.0)  OSTEOARTHRITIS (ICD-715.90)  COLONIC POLYPS, HX OF (ICD-V12.72)  lumbar spinal stenosis   Past Surgical History:  L subclavian bypass September 2005  Tubal ligation  Coronary artery bypass graft July 2001  Femoral popliteal bypass R.-June 2000  status post right common iliac PTA with stent placement, September 2001  status post redo right femoral until bypass February 2002; complicated by early thrombosis, requiring thrombectomy  aorto bifemoral bypass November 2004  colonoscopy October 2006  2011  breat bx (2) 2010  Dexa 12/09  carotid duplex- 6-09  Lumbar MRI 8-10  ABI 4-10   Family History:  Reviewed history from 10/16/2007 and no changes required.  father died age 61. History diabetes, cerebrovascular disease  mother died at age 6, status post hip surgery of an MI  5 brothers, one sister- one brother deceased -DM, CAD, SDAT  Positive diabetes, and coronary artery disease   Social History:  Reviewed history from 02/12/2008 and no changes required.  Former Smoker  Divorced  Past Medical History  Diagnosis Date  . ANEMIA 08/28/2010  .  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 COLONIC POLYPS, HX OF 06/15/2007  . 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 CORONARY ARTERY DISEASE 06/15/2007  . DEPRESSIVE DISORDER 06/06/2008  . GERD 12/19/2009  . HYPERLIPIDEMIA 06/15/2007  . HYPERTENSION 06/15/2007  . HYPOTENSION 08/11/2010  . OSTEOARTHRITIS 06/15/2007  . PEDAL EDEMA 06/06/2009  . PERIPHERAL VASCULAR DISEASE 06/15/2007  . Lumbar stenosis   . Lung abscess 2011  . CHF (congestive heart failure)   . Hx of colonoscopy    Past Surgical History  Procedure Date  . L subclavian bypass 07/2004  . Tubal ligation   . Coronary artery bypass graft 05/2000  . Femoral popliteal bypass-r 04/1999  . Right common iliac pta with stent placement 07/2000  . Right leg blockage 2003  . Aorta bifemoral bypass graft 2004  . Left cea 2005  . Lung cyst biopsy 2011    reports that she quit smoking about 11 years ago. Her smoking use included Cigarettes. She has a 60 pack-year smoking history. She has never used smokeless tobacco. She reports that she does not drink alcohol or use illicit drugs. family history includes Coronary artery disease in an unspecified family member; Diabetes in her father and unspecified family member; and Heart attack in her mother.  There is no history of Colon cancer. Allergies  Allergen Reactions  . Codeine Phosphate     REACTION: unspecified      Review of Systems  Constitutional: Negative for fever, appetite change, fatigue and unexpected weight change.  HENT: Negative for hearing loss, ear pain, nosebleeds, congestion, sore throat, mouth sores, trouble swallowing, neck stiffness, dental problem, voice change, sinus pressure  and tinnitus.   Eyes: Negative for photophobia, pain, redness and visual disturbance.  Respiratory: Negative for cough, chest tightness and shortness of breath.   Cardiovascular: Negative for chest pain, palpitations and leg swelling.  Gastrointestinal: Negative for nausea, vomiting, abdominal pain, diarrhea,  constipation, blood in stool, abdominal distention and rectal pain.  Genitourinary: Negative for dysuria, urgency, frequency, hematuria, flank pain, vaginal bleeding, vaginal discharge, difficulty urinating, genital sores, vaginal pain, menstrual problem and pelvic pain.  Musculoskeletal: Positive for back pain and arthralgias.  Skin: Negative for rash.  Neurological: Negative for dizziness, syncope, speech difficulty, weakness, light-headedness, numbness and headaches.  Hematological: Negative for adenopathy. Does not bruise/bleed easily.  Psychiatric/Behavioral: Negative for suicidal ideas, behavioral problems, self-injury, dysphoric mood and agitation. The patient is not nervous/anxious.        Objective:   Physical Exam  Constitutional: She is oriented to person, place, and time. She appears well-developed and well-nourished.       Blood pressure 140/60 in both arms  HENT:  Head: Normocephalic and atraumatic.  Right Ear: External ear normal.  Left Ear: External ear normal.  Mouth/Throat: Oropharynx is clear and moist.  Eyes: Conjunctivae and EOM are normal.  Neck: Normal range of motion. Neck supple. No JVD present. No thyromegaly present.       Bilateral carotid and supraclavicular bruits  Cardiovascular: Normal rate, regular rhythm and normal heart sounds.   No murmur heard.      The left dorsalis pedis pulse was intact the right dorsalis pedis pulse was diminished. Posterior tibial pulses were nonpalpable  Bilateral carotid and supraclavicular bruits were noted as well as bifemoral bruits  Status post sternotomy scar  Pulmonary/Chest: Effort normal and breath sounds normal. She has no wheezes. She has no rales.  Abdominal: Soft. Bowel sounds are normal. She exhibits no distension and no mass. There is no tenderness. There is no rebound and no guarding.  Musculoskeletal: Normal range of motion. She exhibits no edema and no tenderness.  Neurological: She is alert and oriented to  person, place, and time. She has normal reflexes. No cranial nerve deficit. She exhibits normal muscle tone. Coordination normal.  Skin: Skin is warm and dry. No rash noted.  Psychiatric: She has a normal mood and affect. Her behavior is normal.          Assessment & Plan:   Preventive health examination Coronary artery disease status post CABG in 2001 Peripheral vascular disease Hypertension Dyslipidemia. LDL is at goal less than 70  We'll continue present regimen. Followup cardiology and pulmonary as scheduled. We'll see him in 6 months or as needed

## 2011-09-16 NOTE — Patient Instructions (Signed)
Limit your sodium (Salt) intake  Please check your blood pressure on a regular basis.  If it is consistently greater than 150/90, please make an office appointment.  Followup pulmonary and cardiology as scheduled  Return in 6 months for follow-up

## 2011-09-27 ENCOUNTER — Ambulatory Visit (INDEPENDENT_AMBULATORY_CARE_PROVIDER_SITE_OTHER): Payer: Medicare Other | Admitting: Critical Care Medicine

## 2011-09-27 ENCOUNTER — Encounter: Payer: Self-pay | Admitting: Critical Care Medicine

## 2011-09-27 VITALS — BP 160/82 | HR 95 | Temp 97.7°F | Ht 62.5 in | Wt 154.6 lb

## 2011-09-27 DIAGNOSIS — J449 Chronic obstructive pulmonary disease, unspecified: Secondary | ICD-10-CM

## 2011-09-27 MED ORDER — TIOTROPIUM BROMIDE MONOHYDRATE 18 MCG IN CAPS: 18.0000 ug | ORAL_CAPSULE | Freq: Every day | RESPIRATORY_TRACT | Status: DC

## 2011-09-27 NOTE — Patient Instructions (Signed)
Stop Atrovent Resume spiriva daily, use samples first  Return 4 months

## 2011-09-27 NOTE — Progress Notes (Signed)
Subjective:    Patient ID: [name], female    DOB: 1941/05/19, 70 y.o.   MRN: [id number]  HPI  70 y.o.  WF with RML lung abscess no other risk factors and neg FOB for endobronchial lesion 9/11. Mild COPD ex smoker 60pack years. Normal FeV1, Fef25-75 39% predicted  November 25, 2010 10:27 AM  The pt is now noting more dyspnea times two weeks when walking up stairs. The pt had a "cold" two weeks ago and is worse since that time. There is no cough now. No mucus. No fever.  No real chest pain. There is no wheezing. Pt was on advair in the past but is now off . The pt does have proair and uses this as needed . There is no recurrent chest pain such as she had with lung abscess.   05/25/11 Did well until past few months gets dyspneic with exertion.  One week ago over weekend, throat burning sensation.  In the am, will cough up mucus.  Mucus was a light color.  Sl tint of green.   No chest pain.  No real fever.  No swelling in legs.   Notes more dyspnea if out walking or active.  09/27/2011 At last ov 6/12 we rx spiriva daily Over 6 weeks notes more dyspnea.  No real cough.  No chest pain. Abscess resolved on last OV 6/12. Dyspnea with activity only.  No edema in the feet.  No f/c/s.  Past Medical History  Diagnosis Date  . ANEMIA 08/28/2010  . 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 COLONIC POLYPS, HX OF 06/15/2007  . 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 CORONARY ARTERY DISEASE 06/15/2007  . DEPRESSIVE DISORDER 06/06/2008  . GERD 12/19/2009  . HYPERLIPIDEMIA 06/15/2007  . HYPERTENSION 06/15/2007  . HYPOTENSION 08/11/2010  . OSTEOARTHRITIS 06/15/2007  . PEDAL EDEMA 06/06/2009  . PERIPHERAL VASCULAR DISEASE 06/15/2007  . Lumbar stenosis   . Lung abscess 2011  . CHF (congestive heart failure)   . Hx of colonoscopy      Family History  Problem Relation Age of Onset  . Diabetes Father   . Heart attack Mother   . Diabetes    . Coronary artery disease    . Colon cancer Neg Hx      History   Social  History  . Marital Status: Widowed    Spouse Name: N/A    Number of Children: 2  . 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 Smoking status: Former Smoker -- 1.5 packs/day for 40 years    Types: Cigarettes    Quit date: 06/29/2000  . Smokeless tobacco: Never Used  . Alcohol Use: No  . Drug Use: No  . Sexually Active: Not on file   Other Topics Concern  . Not on file   Social History Narrative  . No narrative on file     Allergies  Allergen Reactions  . Codeine Phosphate     REACTION: unspecified     Outpatient Prescriptions Prior to Visit  Medication Sig Dispense Refill  . acetaminophen (TYLENOL) 325 MG tablet Take 650 mg by mouth every 6 (six) hours as needed.        Marland Kitchen aspirin 81 MG tablet Take 81 mg by mouth daily.        . chlorthalidone (HYGROTON) 25 MG tablet Take 25 mg by mouth daily.        . cilostazol (  PLETAL) 100 MG tablet TAKE 1 TABLET BY MOUTH 2 TIMES A DAY  180 tablet  1  . felodipine (PLENDIL) 5 MG 24 hr tablet Take 1 tablet (5 mg total) by mouth daily.  90 tablet  6  . gabapentin (NEURONTIN) 600 MG tablet Take 600 mg by mouth 3 (three) times daily.        Marland Kitchen HYDROcodone-acetaminophen (VICODIN) 5-500 MG per tablet Take 0.5 tablets by mouth every 4 (four) hours as needed for pain. 1/2 tablet six times daily  90 tablet  3  . isosorbide mononitrate (IMDUR) 30 MG 24 hr tablet TAKE 1 TABLET BY MOUTH EVERY DAY  90 tablet  3  . nitroGLYCERIN (NITROSTAT) 0.4 MG SL tablet Place 1 tablet (0.4 mg total) under the tongue every 5 (five) minutes as needed for chest pain.  100 tablet  3  . Omega-3 Fatty Acids (FISH OIL) 1000 MG CAPS Take 1 capsule by mouth daily.        Marland Kitchen omeprazole (PRILOSEC) 20 MG capsule Take 20 mg by mouth 2 (two) times daily.       . ramipril (ALTACE) 5 MG capsule TAKE ONE CAPSULE BY MOUTH EVERY DAY  90 capsule  3  . sertraline (ZOLOFT) 50 MG tablet Take 25 mg by mouth at bedtime.        . simvastatin (ZOCOR) 80 MG tablet Take 40 mg by mouth daily.       Marland Kitchen ipratropium (ATROVENT HFA) 17 MCG/ACT inhaler Inhale 2 puffs into the lungs 3 (three) times daily.  1 Inhaler  4  . LORazepam (ATIVAN) 0.5 MG tablet Take 1 tablet (0.5 mg total) by mouth 2 (two) times daily.  60 tablet  2  . promethazine (PHENERGAN) 25 MG tablet TAKE ONE TABLET EVERY 6 HOURS FOR NAUSEA  30 tablet  0      Review of Systems  Constitutional:   No  weight loss, night sweats,  Fevers, chills, fatigue, lassitude. HEENT:   No headaches,  Difficulty swallowing,  Tooth/dental problems,  Sore throat,                No sneezing, itching, ear ache, nasal congestion, post nasal drip,   CV:  No chest pain,  Orthopnea, PND, swelling in lower extremities, anasarca, dizziness, palpitations  GI  Notes  heartburn, notes indigestion, abdominal pain, nausea, vomiting, diarrhea, change in bowel habits, loss of appetite  Resp: Notes  shortness of breath with exertion not at rest.  No excess mucus, notes  productive cough,  No non-productive cough,  No coughing up of blood.  No change in color of mucus.  No wheezing.  No chest wall deformity  Skin: no rash or lesions.  GU: no dysuria, change in color of urine, no urgency or frequency.  No flank pain.  MS:  No joint pain or swelling.  No decreased range of motion.  No back pain.  Psych:  No change in mood or affect. No depression or anxiety.  No memory loss.     Objective:   Physical Exam  Filed Vitals:   09/27/11 1601  BP: 160/82  Pulse: 95  Temp: 97.7 F (36.5 C)  TempSrc: Oral  Height: 5' 2.5" (1.588 m)  Weight: 154 lb 9.6 oz (70.126 kg)  SpO2: 93%    Gen: Pleasant, well-nourished, in no distress,  normal affect  ENT: No lesions,  mouth clear,  oropharynx clear, no postnasal drip  Neck: No JVD, no TMG, no carotid bruits  Lungs:  No use of accessory muscles, no dullness to percussion,distant BS  Cardiovascular: RRR, heart sounds normal, no murmur  or gallops, no peripheral edema  Abdomen: soft and NT, no HSM,  BS normal  Musculoskeletal: No deformities, no cyanosis or clubbing  Neuro: alert, non focal  Skin: Warm, no lesions or rashes     6/26 CXR: resolved RLL abscess, smaller cavity   Assessment & Plan:   COPD, MILD Mild progression in airflow obstruction,  Lung abscess has resolved, ex smoker but GERD likely ppt factor  Pt needs to stay on spiriva daily, the patient tried to stop Spiriva because of cost factors and try Atrovent and is not doing as well on this program Plan Resume Spiriva daily  Discontinue Atrovent Generous supply of samples were given to the patient Return 4 months         Updated Medication List Outpatient Encounter Prescriptions as of 09/27/2011  Medication Sig Dispense Refill  . acetaminophen (TYLENOL) 325 MG tablet Take 650 mg by mouth every 6 (six) hours as needed.        Marland Kitchen aspirin 81 MG tablet Take 81 mg by mouth daily.        . chlorthalidone (HYGROTON) 25 MG tablet Take 25 mg by mouth daily.        . cilostazol (PLETAL) 100 MG tablet TAKE 1 TABLET BY MOUTH 2 TIMES A DAY  180 tablet  1  . felodipine (PLENDIL) 5 MG 24 hr tablet Take 1 tablet (5 mg total) by mouth daily.  90 tablet  6  . gabapentin (NEURONTIN) 600 MG tablet Take 600 mg by mouth 3 (three) times daily.        Marland Kitchen HYDROcodone-acetaminophen (VICODIN) 5-500 MG per tablet Take 0.5 tablets by mouth every 4 (four) hours as needed for pain. 1/2 tablet six times daily  90 tablet  3  . isosorbide mononitrate (IMDUR) 30 MG 24 hr tablet TAKE 1 TABLET BY MOUTH EVERY DAY  90 tablet  3  . LORazepam (ATIVAN) 0.5 MG tablet Take 0.5 mg by mouth 2 (two) times daily as needed.        . nitroGLYCERIN (NITROSTAT) 0.4 MG SL tablet Place 1 tablet (0.4 mg total) under the tongue every 5 (five) minutes as needed for chest pain.  100 tablet  3  . Omega-3 Fatty Acids (FISH OIL) 1000 MG CAPS Take 1 capsule by mouth daily.        Marland Kitchen omeprazole (PRILOSEC)  20 MG capsule Take 20 mg by mouth 2 (two) times daily.       . promethazine (PHENERGAN) 25 MG tablet        . ramipril (ALTACE) 5 MG capsule TAKE ONE CAPSULE BY MOUTH EVERY DAY  90 capsule  3  . sertraline (ZOLOFT) 50 MG tablet Take 25 mg by mouth at bedtime.       . simvastatin (ZOCOR) 80 MG tablet Take 40 mg by mouth daily.       Marland Kitchen DISCONTD: ipratropium (ATROVENT HFA) 17 MCG/ACT inhaler Inhale 2 puffs into the lungs 3 (three) times daily.  1 Inhaler  4  . DISCONTD: LORazepam (ATIVAN) 0.5 MG tablet Take 1 tablet (0.5 mg total) by mouth 2 (two) times daily.  60 tablet  2  . DISCONTD: promethazine (PHENERGAN) 25 MG tablet TAKE ONE TABLET EVERY 6 HOURS FOR NAUSEA  30 tablet  0  . tiotropium (SPIRIVA HANDIHALER) 18 MCG inhalation capsule Place 1 capsule (18 mcg total) into inhaler and inhale daily.  30 capsule  11

## 2011-09-27 NOTE — Assessment & Plan Note (Addendum)
Mild progression in airflow obstruction,  Lung abscess has resolved, ex smoker but GERD likely ppt factor  Pt needs to stay on spiriva daily, the patient tried to stop Spiriva because of cost factors and try Atrovent and is not doing as well on this program Plan Resume Spiriva daily  Discontinue Atrovent Generous supply of samples were given to the patient Return 4 months

## 2011-10-22 ENCOUNTER — Telehealth: Payer: Self-pay | Admitting: Critical Care Medicine

## 2011-10-22 NOTE — Telephone Encounter (Signed)
2 samples of spiriva left at front desk for pt to pick up.  Pt aware.

## 2011-10-24 ENCOUNTER — Other Ambulatory Visit: Payer: Self-pay | Admitting: Internal Medicine

## 2011-10-26 ENCOUNTER — Other Ambulatory Visit: Payer: Self-pay

## 2011-10-26 MED ORDER — HYDROCODONE-ACETAMINOPHEN 5-500 MG PO TABS: 0.5000 | ORAL_TABLET | ORAL | Status: DC | PRN

## 2011-11-25 ENCOUNTER — Telehealth: Payer: Self-pay | Admitting: Critical Care Medicine

## 2011-11-25 ENCOUNTER — Other Ambulatory Visit: Payer: Self-pay | Admitting: Internal Medicine

## 2011-11-25 NOTE — Telephone Encounter (Signed)
Called and spoke with pt and she is aware that we have no samples of the spiriva in the office.  Pt will call back next week to see if we have any in .

## 2011-12-03 ENCOUNTER — Telehealth: Payer: Self-pay | Admitting: Internal Medicine

## 2011-12-03 NOTE — Telephone Encounter (Signed)
Pt aware that we dont have any samples  Of spiriva ,advised her to call back first of next week to see if we have any in.

## 2011-12-08 ENCOUNTER — Telehealth: Payer: Self-pay | Admitting: Critical Care Medicine

## 2011-12-08 NOTE — Telephone Encounter (Signed)
Called and spoke with pt. Pt aware 2 boxes of spiriva samples left at front desk for pt to pick up.

## 2011-12-22 ENCOUNTER — Other Ambulatory Visit: Payer: Self-pay

## 2011-12-22 MED ORDER — HYDROCODONE-ACETAMINOPHEN 5-500 MG PO TABS: ORAL_TABLET | ORAL | Status: DC

## 2011-12-22 NOTE — Telephone Encounter (Signed)
Rx request for hydrocodone-acetaminophen 5-500.  Rx last filled 10/26/11 # 90 x 0 rf.  Pt last seen 09/27/11.  Pls advise.

## 2011-12-22 NOTE — Telephone Encounter (Signed)
Rx called into [facility] pharmacy. 

## 2011-12-22 NOTE — Telephone Encounter (Signed)
#  90  RF 3 

## 2012-01-06 ENCOUNTER — Other Ambulatory Visit: Payer: Self-pay

## 2012-01-06 MED ORDER — LORAZEPAM 0.5 MG PO TABS: 0.5000 mg | ORAL_TABLET | Freq: Two times a day (BID) | ORAL | Status: DC | PRN

## 2012-01-17 ENCOUNTER — Ambulatory Visit (INDEPENDENT_AMBULATORY_CARE_PROVIDER_SITE_OTHER)
Admission: RE | Admit: 2012-01-17 | Discharge: 2012-01-17 | Disposition: A | Discharge status: Home / Self Care | Payer: Medicare Other | Source: Ambulatory Visit | Attending: Adult Health | Admitting: Adult Health

## 2012-01-17 ENCOUNTER — Encounter: Payer: Self-pay | Admitting: Adult Health

## 2012-01-17 ENCOUNTER — Ambulatory Visit (INDEPENDENT_AMBULATORY_CARE_PROVIDER_SITE_OTHER): Payer: Medicare Other | Admitting: Adult Health

## 2012-01-17 ENCOUNTER — Other Ambulatory Visit: Payer: Self-pay | Admitting: Internal Medicine

## 2012-01-17 VITALS — BP 148/78 | HR 99 | Temp 97.2°F | Ht 62.5 in | Wt 152.8 lb

## 2012-01-17 DIAGNOSIS — J449 Chronic obstructive pulmonary disease, unspecified: Secondary | ICD-10-CM

## 2012-01-17 MED ORDER — LEVOFLOXACIN 500 MG PO TABS: 500.0000 mg | ORAL_TABLET | Freq: Every day | ORAL | Status: DC

## 2012-01-17 MED ORDER — LEVALBUTEROL HCL 0.63 MG/3ML IN NEBU
0.6300 mg | INHALATION_SOLUTION | Freq: Once | RESPIRATORY_TRACT | Status: AC
  Administered 2012-01-17: 0.63 mg via RESPIRATORY_TRACT

## 2012-01-17 NOTE — Assessment & Plan Note (Signed)
Flare  Check xray today   Plan;  Levaquin 500mg  daily for 7 days  Mucinex DM Twice daily  As needed  Cough/congestion  GERD Diet  Add Pepcid 20mg   At bedtime   I will call with xray results.  Please contact office for sooner follow up if symptoms do not improve or worsen or seek emergency care  follow up Dr. Delford Field  In 1 week and As needed

## 2012-01-17 NOTE — Patient Instructions (Signed)
Levaquin 500mg  daily for 7 days  Mucinex DM Twice daily  As needed  Cough/congestion  GERD Diet  Add Pepcid 20mg   At bedtime   I will call with xray results.  Please contact office for sooner follow up if symptoms do not improve or worsen or seek emergency care  follow up Dr. Delford Field  In 1 week and As needed

## 2012-01-17 NOTE — Progress Notes (Signed)
Subjective:    Patient ID: [name], female    DOB: [birth year], 71 y.o.   MRN: [id number]  HPI  71 y.o.  WF with RML lung abscess no other risk factors and neg FOB for endobronchial lesion 9/11. Mild COPD ex smoker 60pack years. Normal FeV1, Fef25-75 39% predicted  November 25, 2010 10:27 AM  The pt is now noting more dyspnea times two weeks when walking up stairs. The pt had a "cold" two weeks ago and is worse since that time. There is no cough now. No mucus. No fever.  No real chest pain. There is no wheezing. Pt was on advair in the past but is now off . The pt does have proair and uses this as needed . There is no recurrent chest pain such as she had with lung abscess.   05/25/11 Did well until past few months gets dyspneic with exertion.  One week ago over weekend, throat burning sensation.  In the am, will cough up mucus.  Mucus was a light color.  Sl tint of green.   No chest pain.  No real fever.  No swelling in legs.   Notes more dyspnea if out walking or active.   08/2911  At last ov 6/12 we rx spiriva daily Over 6 weeks notes more dyspnea.  No real cough.  No chest pain. Abscess resolved on last OV 6/12. Dyspnea with activity only.  No edema in the feet.  No f/c/s. >>no changes    01/17/2012 Acute OV  Complains of  prod cough with green mucus, increased SOB, wheezing.  Had severe episode of overt reflux into her throat during sleep 3 nights ago. Had severe coughing fit , ate crackers and ginger ale to help with symptoms. Had eaten popcorn for dinner before going to bed.  Next day after episode started coughing.  Of note on ACE. Taking Spiriva daily without missed doses.  No hemoptysis , edema , chest pain .     Past Medical History  Diagnosis Date  . ANEMIA 08/28/2010  . 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 COLONIC POLYPS, HX OF 06/15/2007  . 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 CORONARY ARTERY DISEASE 06/15/2007  . DEPRESSIVE DISORDER 06/06/2008  . GERD 12/19/2009  .  HYPERLIPIDEMIA 06/15/2007  . HYPERTENSION 06/15/2007  . HYPOTENSION 08/11/2010  . OSTEOARTHRITIS 06/15/2007  . PEDAL EDEMA 06/06/2009  . PERIPHERAL VASCULAR DISEASE 06/15/2007  . Lumbar stenosis   . Lung abscess 2011  . CHF (congestive heart failure)   . Hx of colonoscopy      Family History  Problem Relation Age of Onset  . Diabetes Father   . Heart attack Mother   . Diabetes    . Coronary artery disease    . Colon cancer Neg Hx      History   Social History  . Marital Status: Widowed    Spouse Name: N/A    Number of Children: 2  . 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 Smoking status: Former Smoker -- 1.5 packs/day for 40 years    Types: Cigarettes    Quit date: 06/29/2000  . Smokeless tobacco: Never Used  . Alcohol Use: No  . Drug Use: No  . Sexually Active: Not on file   Other Topics Concern  . Not on file   Social History Narrative  . No narrative on file     Allergies  Allergen Reactions  . Codeine Phosphate     REACTION: unspecified     Outpatient Prescriptions Prior to Visit  Medication Sig Dispense Refill  . acetaminophen (TYLENOL) 325 MG tablet Take 650 mg by mouth every 6 (six) hours as needed.        Marland Kitchen aspirin 81 MG tablet Take 81 mg by mouth daily.        . chlorthalidone (HYGROTON) 25 MG tablet Take 25 mg by mouth daily.        . cilostazol (PLETAL) 100 MG tablet TAKE 1 TABLET BY MOUTH 2 TIMES A DAY  180 tablet  1  . felodipine (PLENDIL) 5 MG 24 hr tablet Take 1 tablet (5 mg total) by mouth daily.  90 tablet  6  . gabapentin (NEURONTIN) 600 MG tablet Take 600 mg by mouth 3 (three) times daily.        Marland Kitchen HYDROcodone-acetaminophen (VICODIN) 5-500 MG per tablet 1/2 tablet six times daily  90 tablet  3  . isosorbide mononitrate (IMDUR) 30 MG 24 hr tablet TAKE 1 TABLET BY MOUTH EVERY DAY  90 tablet  3  . LORazepam (ATIVAN) 0.5 MG tablet Take 1 tablet (0.5 mg total) by mouth 2  (two) times daily as needed.  60 tablet  1  . nitroGLYCERIN (NITROSTAT) 0.4 MG SL tablet Place 1 tablet (0.4 mg total) under the tongue every 5 (five) minutes as needed for chest pain.  100 tablet  3  . Omega-3 Fatty Acids (FISH OIL) 1000 MG CAPS Take 1 capsule by mouth daily.        Marland Kitchen omeprazole (PRILOSEC) 20 MG capsule Take 20 mg by mouth 2 (two) times daily.       . promethazine (PHENERGAN) 25 MG tablet        . ramipril (ALTACE) 5 MG capsule TAKE ONE CAPSULE BY MOUTH EVERY DAY  90 capsule  3  . sertraline (ZOLOFT) 50 MG tablet TAKE 1/2 TABLET BY MOUTH AT BEDTIME  90 tablet  1  . simvastatin (ZOCOR) 80 MG tablet TAKE 1 TABLET BY MOUTH EVERY DAY  90 tablet  3  . tiotropium (SPIRIVA HANDIHALER) 18 MCG inhalation capsule Place 1 capsule (18 mcg total) into inhaler and inhale daily.  30 capsule  11      Review of Systems  Constitutional:   No  weight loss, night sweats,  Fevers, chills, fatigue, lassitude. HEENT:   No headaches,  Difficulty swallowing,  Tooth/dental problems,  Sore throat,                No sneezing, itching, ear ache, nasal congestion, post nasal drip,   CV:  No chest pain,  Orthopnea, PND, swelling in lower extremities, anasarca, dizziness, palpitations  GI  Notes  heartburn, notes indigestion, abdominal pain, nausea, vomiting, diarrhea, change in bowel habits, loss of appetite  Resp: Notes  shortness of breath with exertion not at rest.  No excess mucus, notes  productive cough,  No non-productive cough,  No coughing up of blood.  No change in color of mucus.  No wheezing.  No chest wall deformity  Skin: no rash or lesions.  GU: no dysuria, change in color of urine, no urgency or frequency.  No flank pain.  MS:  No joint pain or swelling.  No decreased range of motion.  No back pain.  Psych:  No change in mood or affect. No depression or anxiety.  No memory loss.     Objective:   Physical Exam  Filed Vitals:   01/17/12 1025  BP: 148/78  Pulse: 99  Temp:  97.2 F (36.2 C)  TempSrc: Oral  Height: 5' 2.5" (1.588 m)  Weight: 152 lb 12.8 oz (69.31 kg)  SpO2: 94%    Gen: Pleasant,  in no distress,  normal affect  ENT: No lesions,  mouth clear,  oropharynx clear, no postnasal drip  Neck: No JVD, no TMG, no carotid bruits  Lungs: No use of accessory muscles, no dullness to percussion, Coarse BS   Cardiovascular: RRR, heart sounds normal, no murmur or gallops, no peripheral edema  Abdomen: soft and NT, no HSM,  BS normal  Musculoskeletal: No deformities, no cyanosis or clubbing  Neuro: alert, non focal  Skin: Warm, no lesions or rashes     6/26 CXR: resolved RLL abscess, smaller cavity   Assessment & Plan:   No problem-specific assessment & plan notes found for this encounter.   Updated Medication List Outpatient Encounter Prescriptions as of 01/17/2012  Medication Sig Dispense Refill  . acetaminophen (TYLENOL) 325 MG tablet Take 650 mg by mouth every 6 (six) hours as needed.        Marland Kitchen aspirin 81 MG tablet Take 81 mg by mouth daily.        . chlorthalidone (HYGROTON) 25 MG tablet Take 25 mg by mouth daily.        . cilostazol (PLETAL) 100 MG tablet TAKE 1 TABLET BY MOUTH 2 TIMES A DAY  180 tablet  1  . felodipine (PLENDIL) 5 MG 24 hr tablet Take 1 tablet (5 mg total) by mouth daily.  90 tablet  6  . gabapentin (NEURONTIN) 600 MG tablet Take 600 mg by mouth 3 (three) times daily.        Marland Kitchen HYDROcodone-acetaminophen (VICODIN) 5-500 MG per tablet 1/2 tablet six times daily  90 tablet  3  . isosorbide mononitrate (IMDUR) 30 MG 24 hr tablet TAKE 1 TABLET BY MOUTH EVERY DAY  90 tablet  3  . LORazepam (ATIVAN) 0.5 MG tablet Take 1 tablet (0.5 mg total) by mouth 2 (two) times daily as needed.  60 tablet  1  . nitroGLYCERIN (NITROSTAT) 0.4 MG SL tablet Place 1 tablet (0.4 mg total) under the tongue every 5 (five) minutes as needed for chest pain.  100 tablet  3  . Omega-3 Fatty Acids (FISH OIL) 1000 MG CAPS Take 1 capsule by mouth daily.         Marland Kitchen omeprazole (PRILOSEC) 20 MG capsule Take 20 mg by mouth 2 (two) times daily.       . promethazine (PHENERGAN) 25 MG tablet        . ramipril (ALTACE) 5 MG capsule TAKE ONE CAPSULE BY MOUTH EVERY DAY  90 capsule  3  . sertraline (ZOLOFT) 50 MG tablet TAKE 1/2 TABLET BY MOUTH AT BEDTIME  90 tablet  1  . simvastatin (ZOCOR) 80 MG tablet TAKE 1 TABLET BY MOUTH EVERY DAY  90 tablet  3  . tiotropium (SPIRIVA HANDIHALER) 18 MCG inhalation capsule Place 1 capsule (18 mcg total) into inhaler and inhale daily.  30 capsule  11

## 2012-01-18 ENCOUNTER — Telehealth: Payer: Self-pay | Admitting: Critical Care Medicine

## 2012-01-18 NOTE — Progress Notes (Signed)
Quick Note:  Pt returned call and was informed of CXR results and recs per TP. She verbalized understanding of this. ______

## 2012-01-18 NOTE — Telephone Encounter (Signed)
Notes Recorded by Rubye Oaks, NP on 01/17/2012 at 2:12 PM No sign of PNA  Cont w ov recs . follow up as planned and As needed  Please contact office for sooner follow up if symptoms do not improve or worsen or seek emergency care   Called, spoke with pt.  I informed her of above results and recs per TP.  She verbalized understanding of this.

## 2012-01-25 ENCOUNTER — Ambulatory Visit (INDEPENDENT_AMBULATORY_CARE_PROVIDER_SITE_OTHER): Payer: Medicare Other | Admitting: Critical Care Medicine

## 2012-01-25 ENCOUNTER — Encounter: Payer: Self-pay | Admitting: Critical Care Medicine

## 2012-01-25 DIAGNOSIS — J449 Chronic obstructive pulmonary disease, unspecified: Secondary | ICD-10-CM

## 2012-01-25 NOTE — Patient Instructions (Signed)
No change in medications. Return in         4 months 

## 2012-01-25 NOTE — Progress Notes (Signed)
Subjective:    Patient ID: [name], female    DOB: Jan 31, 1941, 71 y.o.   MRN: [id number]  HPI  71 y.o.  WF with RML lung abscess no other risk factors and neg FOB for endobronchial lesion 9/11. Mild COPD ex smoker 60pack years. Normal FeV1, Fef25-75 39% predicted     2/18 Acute OV  Complains of  prod cough with green mucus, increased SOB, wheezing.  Had severe episode of overt reflux into her throat during sleep 3 nights ago. Had severe coughing fit , ate crackers and ginger ale to help with symptoms. Had eaten popcorn for dinner before going to bed.  Next day after episode started coughing.  Of note on ACE. Taking Spiriva daily without missed doses.  No hemoptysis , edema , chest pain .   2/26 Rx levaquin per NP 2/18. Pt had bad case of acid reflux, then noted more issues. Now is better.  Now no chest pain.  No wheezes now.  Now minimal mucus is noted.  Mucus is white now.   Past Medical History  Diagnosis Date  . ANEMIA 08/28/2010  . 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 COLONIC POLYPS, HX OF 06/15/2007  . 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 CORONARY ARTERY DISEASE 06/15/2007  . DEPRESSIVE DISORDER 06/06/2008  . GERD 12/19/2009  . HYPERLIPIDEMIA 06/15/2007  . HYPERTENSION 06/15/2007  . HYPOTENSION 08/11/2010  . OSTEOARTHRITIS 06/15/2007  . PEDAL EDEMA 06/06/2009  . PERIPHERAL VASCULAR DISEASE 06/15/2007  . Lumbar stenosis   . Lung abscess 2011  . CHF (congestive heart failure)   . Hx of colonoscopy      Family History  Problem Relation Age of Onset  . Diabetes Father   . Heart attack Mother   . Diabetes    . Coronary artery disease    . Colon cancer Neg Hx      History   Social History  . Marital Status: Widowed    Spouse Name: N/A    Number of Children: 2  . 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 Smoking status: Former Smoker -- 1.5 packs/day for 40 years    Types: Cigarettes    Quit date:  06/29/2000  . Smokeless tobacco: Never Used  . Alcohol Use: No  . Drug Use: No  . Sexually Active: Not on file   Other Topics Concern  . Not on file   Social History Narrative  . No narrative on file     Allergies  Allergen Reactions  . Codeine Phosphate     REACTION: unspecified     Outpatient Prescriptions Prior to Visit  Medication Sig Dispense Refill  . acetaminophen (TYLENOL) 325 MG tablet Take 650 mg by mouth every 6 (six) hours as needed.        Marland Kitchen aspirin 81 MG tablet Take 81 mg by mouth daily.        . chlorthalidone (HYGROTON) 25 MG tablet Take 25 mg by mouth daily.        . cilostazol (PLETAL) 100 MG tablet TAKE 1 TABLET BY MOUTH 2 TIMES A DAY  180 tablet  1  . felodipine (PLENDIL) 5 MG 24 hr tablet Take 1 tablet (5 mg total) by mouth daily.  90 tablet  6  . gabapentin (NEURONTIN) 600 MG tablet Take 600 mg by mouth 3 (three) times daily.        Marland Kitchen HYDROcodone-acetaminophen (VICODIN) 5-500 MG per  tablet 1/2 tablet six times daily  90 tablet  3  . isosorbide mononitrate (IMDUR) 30 MG 24 hr tablet TAKE 1 TABLET BY MOUTH EVERY DAY  90 tablet  3  . LORazepam (ATIVAN) 0.5 MG tablet Take 1 tablet (0.5 mg total) by mouth 2 (two) times daily as needed.  60 tablet  1  . nitroGLYCERIN (NITROSTAT) 0.4 MG SL tablet Place 1 tablet (0.4 mg total) under the tongue every 5 (five) minutes as needed for chest pain.  100 tablet  3  . Omega-3 Fatty Acids (FISH OIL) 1000 MG CAPS Take 1 capsule by mouth daily.        Marland Kitchen omeprazole (PRILOSEC) 20 MG capsule Take 20 mg by mouth 2 (two) times daily.       . promethazine (PHENERGAN) 25 MG tablet TAKE ONE TABLET EVERY 6 HOURS FOR NAUSEA  20 tablet  0  . ramipril (ALTACE) 5 MG capsule TAKE ONE CAPSULE BY MOUTH EVERY DAY  90 capsule  3  . sertraline (ZOLOFT) 50 MG tablet TAKE 1/2 TABLET BY MOUTH AT BEDTIME  90 tablet  1  . simvastatin (ZOCOR) 80 MG tablet TAKE 1 TABLET BY MOUTH EVERY DAY  90 tablet  3  . tiotropium (SPIRIVA HANDIHALER) 18 MCG  inhalation capsule Place 1 capsule (18 mcg total) into inhaler and inhale daily.  30 capsule  11  . levofloxacin (LEVAQUIN) 500 MG tablet Take 1 tablet (500 mg total) by mouth daily.  7 tablet  0  . promethazine (PHENERGAN) 25 MG tablet            Review of Systems  Constitutional:   No  weight loss, night sweats,  Fevers, chills, fatigue, lassitude. HEENT:   No headaches,  Difficulty swallowing,  Tooth/dental problems,  Sore throat,                No sneezing, itching, ear ache, nasal congestion, post nasal drip,   CV:  No chest pain,  Orthopnea, PND, swelling in lower extremities, anasarca, dizziness, palpitations  GI  Notes  heartburn, notes indigestion, abdominal pain, nausea, vomiting, diarrhea, change in bowel habits, loss of appetite  Resp: Notes  shortness of breath with exertion not at rest.  No excess mucus, notes  productive cough,  No non-productive cough,  No coughing up of blood.  No change in color of mucus.  No wheezing.  No chest wall deformity  Skin: no rash or lesions.  GU: no dysuria, change in color of urine, no urgency or frequency.  No flank pain.  MS:  No joint pain or swelling.  No decreased range of motion.  No back pain.  Psych:  No change in mood or affect. No depression or anxiety.  No memory loss.     Objective:   Physical Exam  Filed Vitals:   01/25/12 1613  BP: 150/60  Pulse: 94  Temp: 97.9 F (36.6 C)  TempSrc: Oral  Height: 5' 2.5" (1.588 m)  Weight: 155 lb 9.6 oz (70.58 kg)  SpO2: 91%    Gen: Pleasant,  in no distress,  normal affect  ENT: No lesions,  mouth clear,  oropharynx clear, no postnasal drip  Neck: No JVD, no TMG, no carotid bruits  Lungs: No use of accessory muscles, no dullness to percussion, Coarse BS   Cardiovascular: RRR, heart sounds normal, no murmur or gallops, no peripheral edema  Abdomen: soft and NT, no HSM,  BS normal  Musculoskeletal: No deformities, no cyanosis or clubbing  Neuro: alert, non  focal  Skin: Warm, no lesions or rashes       Assessment & Plan:   COPD, MILD Gold B COPD with stable airway function Plan No change in inhaled or maintenance medications. Return in 4 months     Updated Medication List Outpatient Encounter Prescriptions as of 01/25/2012  Medication Sig Dispense Refill  . acetaminophen (TYLENOL) 325 MG tablet Take 650 mg by mouth every 6 (six) hours as needed.        Marland Kitchen aspirin 81 MG tablet Take 81 mg by mouth daily.        . chlorthalidone (HYGROTON) 25 MG tablet Take 25 mg by mouth daily.        . cilostazol (PLETAL) 100 MG tablet TAKE 1 TABLET BY MOUTH 2 TIMES A DAY  180 tablet  1  . felodipine (PLENDIL) 5 MG 24 hr tablet Take 1 tablet (5 mg total) by mouth daily.  90 tablet  6  . gabapentin (NEURONTIN) 600 MG tablet Take 600 mg by mouth 3 (three) times daily.        Marland Kitchen HYDROcodone-acetaminophen (VICODIN) 5-500 MG per tablet 1/2 tablet six times daily  90 tablet  3  . isosorbide mononitrate (IMDUR) 30 MG 24 hr tablet TAKE 1 TABLET BY MOUTH EVERY DAY  90 tablet  3  . LORazepam (ATIVAN) 0.5 MG tablet Take 1 tablet (0.5 mg total) by mouth 2 (two) times daily as needed.  60 tablet  1  . nitroGLYCERIN (NITROSTAT) 0.4 MG SL tablet Place 1 tablet (0.4 mg total) under the tongue every 5 (five) minutes as needed for chest pain.  100 tablet  3  . Omega-3 Fatty Acids (FISH OIL) 1000 MG CAPS Take 1 capsule by mouth daily.        Marland Kitchen omeprazole (PRILOSEC) 20 MG capsule Take 20 mg by mouth 2 (two) times daily.       . promethazine (PHENERGAN) 25 MG tablet TAKE ONE TABLET EVERY 6 HOURS FOR NAUSEA  20 tablet  0  . ramipril (ALTACE) 5 MG capsule TAKE ONE CAPSULE BY MOUTH EVERY DAY  90 capsule  3  . sertraline (ZOLOFT) 50 MG tablet TAKE 1/2 TABLET BY MOUTH AT BEDTIME  90 tablet  1  . simvastatin (ZOCOR) 80 MG tablet TAKE 1 TABLET BY MOUTH EVERY DAY  90 tablet  3  . tiotropium (SPIRIVA HANDIHALER) 18 MCG inhalation capsule Place 1 capsule (18 mcg total) into inhaler  and inhale daily.  30 capsule  11  . DISCONTD: levofloxacin (LEVAQUIN) 500 MG tablet Take 1 tablet (500 mg total) by mouth daily.  7 tablet  0  . DISCONTD: promethazine (PHENERGAN) 25 MG tablet

## 2012-01-26 NOTE — Assessment & Plan Note (Signed)
Gold B COPD with stable airway function Plan No change in inhaled or maintenance medications. Return in 4 months

## 2012-03-02 ENCOUNTER — Telehealth: Payer: Self-pay | Admitting: Pulmonary Disease

## 2012-03-02 NOTE — Telephone Encounter (Signed)
I spoke with pt and advised her we did not have any samples at this time. She voiced her understanding and had no questions. Pt did not need an rx called in for her

## 2012-03-06 ENCOUNTER — Telehealth: Payer: Self-pay | Admitting: Critical Care Medicine

## 2012-03-06 ENCOUNTER — Other Ambulatory Visit: Payer: Self-pay | Admitting: Internal Medicine

## 2012-03-06 NOTE — Telephone Encounter (Signed)
1 box left up front for pick up. Pt aware. States nothing further needed.

## 2012-03-08 ENCOUNTER — Encounter: Payer: Self-pay | Admitting: Internal Medicine

## 2012-03-16 ENCOUNTER — Encounter: Payer: Self-pay | Admitting: Internal Medicine

## 2012-03-16 ENCOUNTER — Ambulatory Visit (INDEPENDENT_AMBULATORY_CARE_PROVIDER_SITE_OTHER): Payer: Medicare Other | Admitting: Internal Medicine

## 2012-03-16 VITALS — BP 90/60 | Temp 97.9°F | Wt 155.0 lb

## 2012-03-16 DIAGNOSIS — E785 Hyperlipidemia, unspecified: Secondary | ICD-10-CM

## 2012-03-16 DIAGNOSIS — J449 Chronic obstructive pulmonary disease, unspecified: Secondary | ICD-10-CM

## 2012-03-16 DIAGNOSIS — I739 Peripheral vascular disease, unspecified: Secondary | ICD-10-CM

## 2012-03-16 DIAGNOSIS — I10 Essential (primary) hypertension: Secondary | ICD-10-CM

## 2012-03-16 DIAGNOSIS — I2510 Atherosclerotic heart disease of native coronary artery without angina pectoris: Secondary | ICD-10-CM

## 2012-03-16 NOTE — Patient Instructions (Signed)
Return in 6 months for follow-up     It is important that you exercise regularly, at least 20 minutes 3 to 4 times per week.  If you develop chest pain or shortness of breath seek  medical attention.  Limit your sodium (Salt) intake

## 2012-03-16 NOTE — Progress Notes (Signed)
  Subjective:    Patient ID: [name], female    DOB: [birth year], 71 y.o.   MRN: [id number]  HPI  71 year old patient who is in today for her six-month followup. She has recently been followed up by pulmonary medicine for a lung abscess. She has mild COPD which has been stable on inhalational medication She has coronary artery and peripheral vascular disease. Denies any exertional chest pain or claudication. She has treated hypertension. Blood pressure bit low today after some diarrhea over the past 24 hours denies any significant orthostatic symptoms She has a history of dyslipidemia and remote tobacco use. In general has done quite well    Review of Systems  Constitutional: Negative.   HENT: Negative for hearing loss, congestion, sore throat, rhinorrhea, dental problem, sinus pressure and tinnitus.   Eyes: Negative for pain, discharge and visual disturbance.  Respiratory: Negative for cough and shortness of breath.   Cardiovascular: Negative for chest pain, palpitations and leg swelling.  Gastrointestinal: Positive for diarrhea. Negative for nausea, vomiting, abdominal pain, constipation, blood in stool and abdominal distention.  Genitourinary: Negative for dysuria, urgency, frequency, hematuria, flank pain, vaginal bleeding, vaginal discharge, difficulty urinating, vaginal pain and pelvic pain.  Musculoskeletal: Negative for joint swelling, arthralgias and gait problem.  Skin: Negative for rash.  Neurological: Negative for dizziness, syncope, speech difficulty, weakness, numbness and headaches.  Hematological: Negative for adenopathy.  Psychiatric/Behavioral: Negative for behavioral problems, dysphoric mood and agitation. The patient is not nervous/anxious.        Objective:   Physical Exam  Constitutional: She is oriented to person, place, and time. She appears well-developed and well-nourished.  HENT:  Head: Normocephalic.  Right Ear: External ear normal.  Left Ear:  External ear normal.  Mouth/Throat: Oropharynx is clear and moist.  Eyes: Conjunctivae and EOM are normal. Pupils are equal, round, and reactive to light.  Neck: Normal range of motion. Neck supple. No thyromegaly present.       Bilateral carotid bruits  Cardiovascular: Normal rate, regular rhythm and normal heart sounds.        The left dorsalis pedis pulse full right dorsalis pedis pulse faint. Posterior tibial pulses were nonpalpable  Pulmonary/Chest: Effort normal and breath sounds normal.  Abdominal: Soft. Bowel sounds are normal. She exhibits no mass. There is no tenderness.  Musculoskeletal: Normal range of motion.  Lymphadenopathy:    She has no cervical adenopathy.  Neurological: She is alert and oriented to person, place, and time.  Skin: Skin is warm and dry. No rash noted.  Psychiatric: She has a normal mood and affect. Her behavior is normal.          Assessment & Plan:   Mild COPD stable Hypertension controlled Coronary artery disease stable Dyslipidemia. Continue simvastatin 80 mg daily Peripheral vascular disease asymptomatic   Recheck 6 months with updated lab

## 2012-03-21 ENCOUNTER — Telehealth: Payer: Self-pay | Admitting: Critical Care Medicine

## 2012-03-21 NOTE — Telephone Encounter (Signed)
2 samples of spiriva placed at front for pick up.  Pt aware.

## 2012-03-30 ENCOUNTER — Other Ambulatory Visit: Payer: Self-pay | Admitting: Cardiology

## 2012-04-03 ENCOUNTER — Encounter (INDEPENDENT_AMBULATORY_CARE_PROVIDER_SITE_OTHER): Payer: Medicare Other

## 2012-04-03 DIAGNOSIS — I6529 Occlusion and stenosis of unspecified carotid artery: Secondary | ICD-10-CM

## 2012-04-24 ENCOUNTER — Other Ambulatory Visit: Payer: Self-pay | Admitting: Internal Medicine

## 2012-04-26 ENCOUNTER — Telehealth: Payer: Self-pay | Admitting: Critical Care Medicine

## 2012-04-26 NOTE — Telephone Encounter (Signed)
Pt aware that sample is at the front for pick up.

## 2012-05-01 ENCOUNTER — Other Ambulatory Visit: Payer: Self-pay | Admitting: Internal Medicine

## 2012-05-01 MED ORDER — LORAZEPAM 0.5 MG PO TABS: 0.5000 mg | ORAL_TABLET | Freq: Two times a day (BID) | ORAL | Status: DC | PRN

## 2012-05-01 NOTE — Telephone Encounter (Signed)
Pt called and said that she can get LORazepam (ATIVAN) 0.5 MG tablet cheaper at [facility] on [address] and is wondering if she could get a refill sent over to [facility]? Pt is almost out of med.

## 2012-05-01 NOTE — Telephone Encounter (Signed)
Called in to [facility] - pt aware

## 2012-05-19 ENCOUNTER — Other Ambulatory Visit: Payer: Self-pay

## 2012-05-19 MED ORDER — HYDROCODONE-ACETAMINOPHEN 5-500 MG PO TABS: ORAL_TABLET | ORAL | Status: DC

## 2012-05-19 NOTE — Telephone Encounter (Signed)
[facility] 

## 2012-05-19 NOTE — Telephone Encounter (Signed)
ok 

## 2012-05-19 NOTE — Telephone Encounter (Signed)
Faxed RF request from [facility] for vicodin 5-500 RF Last seen 03/16/12 6 mos rov  Last written 12/22/11 # [phone] [id number] Please advise

## 2012-05-29 ENCOUNTER — Other Ambulatory Visit: Payer: Self-pay | Admitting: Internal Medicine

## 2012-05-31 ENCOUNTER — Ambulatory Visit (INDEPENDENT_AMBULATORY_CARE_PROVIDER_SITE_OTHER): Payer: Medicare Other | Admitting: Critical Care Medicine

## 2012-05-31 ENCOUNTER — Encounter: Payer: Self-pay | Admitting: Critical Care Medicine

## 2012-05-31 VITALS — BP 172/74 | HR 91 | Temp 97.6°F | Ht 62.5 in | Wt 155.4 lb

## 2012-05-31 DIAGNOSIS — J449 Chronic obstructive pulmonary disease, unspecified: Secondary | ICD-10-CM

## 2012-05-31 NOTE — Progress Notes (Signed)
Subjective:    Patient ID: [name], female    DOB: 06-02-1941, 71 y.o.   MRN: [id number]  HPI  71 y.o.  WF with RML lung abscess no other risk factors and neg FOB for endobronchial lesion 9/11. Mild COPD ex smoker 60pack years. Normal FeV1, Fef25-75 39% predicted   05/31/2012 Cough doing ok.  Two weeks ago had mild GERD spell.  One week coughed then stopped. No smoking Pt denies any significant sore throat, nasal congestion or excess secretions, fever, chills, sweats, unintended weight loss, pleurtic or exertional chest pain, orthopnea PND, or leg swelling Pt denies any increase in rescue therapy over baseline, denies waking up needing it or having any early am or nocturnal exacerbations of coughing/wheezing/or dyspnea. Pt also denies any obvious fluctuation in symptoms with  weather or environmental change or other alleviating or aggravating factors   Past Medical History  Diagnosis Date  . ANEMIA 08/28/2010  . 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 COLONIC POLYPS, HX OF 06/15/2007  . 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 CORONARY ARTERY DISEASE 06/15/2007  . DEPRESSIVE DISORDER 06/06/2008  . GERD 12/19/2009  . HYPERLIPIDEMIA 06/15/2007  . HYPERTENSION 06/15/2007  . HYPOTENSION 08/11/2010  . OSTEOARTHRITIS 06/15/2007  . PEDAL EDEMA 06/06/2009  . PERIPHERAL VASCULAR DISEASE 06/15/2007  . Lumbar stenosis   . Lung abscess 2011  . CHF (congestive heart failure)   . Hx of colonoscopy      Family History  Problem Relation Age of Onset  . Diabetes Father   . Heart attack Mother   . Diabetes    . Coronary artery disease    . Colon cancer Neg Hx      History   Social History  . Marital Status: Widowed    Spouse Name: N/A    Number of Children: 2  . 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 Smoking status: Former Smoker -- 1.5 packs/day for 40 years    Types: Cigarettes    Quit date: 06/29/2000  . Smokeless tobacco:  Never Used  . Alcohol Use: No  . Drug Use: No  . Sexually Active: Not on file   Other Topics Concern  . Not on file   Social History Narrative  . No narrative on file     Allergies  Allergen Reactions  . Codeine Phosphate     REACTION: unspecified     Outpatient Prescriptions Prior to Visit  Medication Sig Dispense Refill  . acetaminophen (TYLENOL) 325 MG tablet Take 650 mg by mouth every 6 (six) hours as needed.        Marland Kitchen aspirin 81 MG tablet Take 81 mg by mouth daily.        . chlorthalidone (HYGROTON) 25 MG tablet TAKE 1 TABLET BY MOUTH DAILY  90 tablet  1  . cilostazol (PLETAL) 100 MG tablet TAKE 1 TABLET BY MOUTH 2 TIMES A DAY  180 tablet  1  . felodipine (PLENDIL) 5 MG 24 hr tablet Take 1 tablet (5 mg total) by mouth daily.  90 tablet  6  . gabapentin (NEURONTIN) 600 MG tablet Take 600 mg by mouth 3 (three) times daily.        Marland Kitchen HYDROcodone-acetaminophen (VICODIN) 5-500 MG per tablet 1/2 tablet six times daily  90 tablet  3  . isosorbide mononitrate (IMDUR) 30 MG 24 hr tablet TAKE 1 TABLET BY MOUTH EVERY DAY  90 tablet  3  .  LORazepam (ATIVAN) 0.5 MG tablet Take 1 tablet (0.5 mg total) by mouth 2 (two) times daily as needed.  60 tablet  1  . nitroGLYCERIN (NITROSTAT) 0.4 MG SL tablet Place 1 tablet (0.4 mg total) under the tongue every 5 (five) minutes as needed for chest pain.  100 tablet  3  . Omega-3 Fatty Acids (FISH OIL) 1000 MG CAPS Take 1 capsule by mouth daily.        Marland Kitchen omeprazole (PRILOSEC) 20 MG capsule Take 20 mg by mouth 2 (two) times daily.       . promethazine (PHENERGAN) 25 MG tablet TAKE ONE TABLET BY MOUTH EVERY 6 HOURS FOR NAUSEA  20 tablet  0  . ramipril (ALTACE) 5 MG capsule TAKE ONE CAPSULE BY MOUTH EVERY DAY  90 capsule  3  . sertraline (ZOLOFT) 50 MG tablet TAKE 1/2 TABLET BY MOUTH AT BEDTIME  90 tablet  1  . simvastatin (ZOCOR) 80 MG tablet TAKE 1 TABLET BY MOUTH EVERY DAY  90 tablet  3  . tiotropium (SPIRIVA HANDIHALER) 18 MCG inhalation capsule Place  1 capsule (18 mcg total) into inhaler and inhale daily.  30 capsule  11  . omeprazole (PRILOSEC) 20 MG capsule TAKE ONE CAPSULE BY MOUTH TWICE A DAY  180 capsule  2      Review of Systems  Constitutional:   No  weight loss, night sweats,  Fevers, chills, fatigue, lassitude. HEENT:   No headaches,  Difficulty swallowing,  Tooth/dental problems,  Sore throat,                No sneezing, itching, ear ache, nasal congestion, post nasal drip,   CV:  No chest pain,  Orthopnea, PND, swelling in lower extremities, anasarca, dizziness, palpitations  GI  Notes  heartburn, notes indigestion, abdominal pain, nausea, vomiting, diarrhea, change in bowel habits, loss of appetite  Resp: Notes  shortness of breath with exertion not at rest.  No excess mucus, notes  productive cough,  No non-productive cough,  No coughing up of blood.  No change in color of mucus.  No wheezing.  No chest wall deformity  Skin: no rash or lesions.  GU: no dysuria, change in color of urine, no urgency or frequency.  No flank pain.  MS:  No joint pain or swelling.  No decreased range of motion.  No back pain.  Psych:  No change in mood or affect. No depression or anxiety.  No memory loss.     Objective:   Physical Exam  Filed Vitals:   05/31/12 1622  BP: 172/74  Pulse: 91  Temp: 97.6 F (36.4 C)  TempSrc: Oral  Height: 5' 2.5" (1.588 m)  Weight: 70.489 kg (155 lb 6.4 oz)  SpO2: 95%    Gen: Pleasant,  in no distress,  normal affect  ENT: No lesions,  mouth clear,  oropharynx clear, no postnasal drip  Neck: No JVD, no TMG, no carotid bruits  Lungs: No use of accessory muscles, no dullness to percussion, Coarse BS   Cardiovascular: RRR, heart sounds normal, no murmur or gallops, no peripheral edema  Abdomen: soft and NT, no HSM,  BS normal  Musculoskeletal: No deformities, no cyanosis or clubbing  Neuro: alert, non focal  Skin: Warm, no lesions or rashes       Assessment & Plan:   COPD,  MILD Stable copd Plan Maintain spiriva     Updated Medication List Outpatient Encounter Prescriptions as of 05/31/2012  Medication Sig Dispense  Refill  . acetaminophen (TYLENOL) 325 MG tablet Take 650 mg by mouth every 6 (six) hours as needed.        Marland Kitchen aspirin 81 MG tablet Take 81 mg by mouth daily.        . chlorthalidone (HYGROTON) 25 MG tablet TAKE 1 TABLET BY MOUTH DAILY  90 tablet  1  . cilostazol (PLETAL) 100 MG tablet TAKE 1 TABLET BY MOUTH 2 TIMES A DAY  180 tablet  1  . felodipine (PLENDIL) 5 MG 24 hr tablet Take 1 tablet (5 mg total) by mouth daily.  90 tablet  6  . gabapentin (NEURONTIN) 600 MG tablet Take 600 mg by mouth 3 (three) times daily.        Marland Kitchen HYDROcodone-acetaminophen (VICODIN) 5-500 MG per tablet 1/2 tablet six times daily  90 tablet  3  . isosorbide mononitrate (IMDUR) 30 MG 24 hr tablet TAKE 1 TABLET BY MOUTH EVERY DAY  90 tablet  3  . LORazepam (ATIVAN) 0.5 MG tablet Take 1 tablet (0.5 mg total) by mouth 2 (two) times daily as needed.  60 tablet  1  . nitroGLYCERIN (NITROSTAT) 0.4 MG SL tablet Place 1 tablet (0.4 mg total) under the tongue every 5 (five) minutes as needed for chest pain.  100 tablet  3  . Omega-3 Fatty Acids (FISH OIL) 1000 MG CAPS Take 1 capsule by mouth daily.        Marland Kitchen omeprazole (PRILOSEC) 20 MG capsule Take 20 mg by mouth 2 (two) times daily.       . promethazine (PHENERGAN) 25 MG tablet TAKE ONE TABLET BY MOUTH EVERY 6 HOURS FOR NAUSEA  20 tablet  0  . ramipril (ALTACE) 5 MG capsule TAKE ONE CAPSULE BY MOUTH EVERY DAY  90 capsule  3  . sertraline (ZOLOFT) 50 MG tablet TAKE 1/2 TABLET BY MOUTH AT BEDTIME  90 tablet  1  . simvastatin (ZOCOR) 80 MG tablet TAKE 1 TABLET BY MOUTH EVERY DAY  90 tablet  3  . tiotropium (SPIRIVA HANDIHALER) 18 MCG inhalation capsule Place 1 capsule (18 mcg total) into inhaler and inhale daily.  30 capsule  11  . DISCONTD: omeprazole (PRILOSEC) 20 MG capsule TAKE ONE CAPSULE BY MOUTH TWICE A DAY  180 capsule  2

## 2012-05-31 NOTE — Patient Instructions (Signed)
No change in medications. Return in        6 months        

## 2012-06-01 NOTE — Assessment & Plan Note (Signed)
Stable copd Plan Maintain spiriva

## 2012-06-06 ENCOUNTER — Telehealth: Payer: Self-pay | Admitting: Critical Care Medicine

## 2012-06-06 NOTE — Telephone Encounter (Signed)
2 boxes of spiriva was left up front. Pt aware and states nothing further needed.

## 2012-06-26 ENCOUNTER — Telehealth: Payer: Self-pay | Admitting: Critical Care Medicine

## 2012-06-26 ENCOUNTER — Ambulatory Visit (INDEPENDENT_AMBULATORY_CARE_PROVIDER_SITE_OTHER)
Admission: RE | Admit: 2012-06-26 | Discharge: 2012-06-26 | Disposition: A | Discharge status: Home / Self Care | Payer: Medicare Other | Source: Ambulatory Visit | Attending: Critical Care Medicine | Admitting: Critical Care Medicine

## 2012-06-26 ENCOUNTER — Ambulatory Visit (INDEPENDENT_AMBULATORY_CARE_PROVIDER_SITE_OTHER): Payer: Medicare Other | Admitting: Critical Care Medicine

## 2012-06-26 ENCOUNTER — Encounter: Payer: Self-pay | Admitting: Critical Care Medicine

## 2012-06-26 VITALS — BP 122/64 | HR 97 | Temp 98.4°F | Ht 62.5 in | Wt 155.0 lb

## 2012-06-26 DIAGNOSIS — R05 Cough: Secondary | ICD-10-CM

## 2012-06-26 DIAGNOSIS — J4489 Other specified chronic obstructive pulmonary disease: Secondary | ICD-10-CM

## 2012-06-26 DIAGNOSIS — J449 Chronic obstructive pulmonary disease, unspecified: Secondary | ICD-10-CM

## 2012-06-26 DIAGNOSIS — J189 Pneumonia, unspecified organism: Secondary | ICD-10-CM | POA: Insufficient documentation

## 2012-06-26 NOTE — Assessment & Plan Note (Signed)
Left upper and lower lobe community acquired pneumonia with chronic obstructive lung disease flare no organism specified Plan Avelox 4 mg daily for 7 days Continued inhaled medications as prescribed Return 1 week with repeat chest x-ray

## 2012-06-26 NOTE — Assessment & Plan Note (Signed)
CAT 12 06/26/2012

## 2012-06-26 NOTE — Telephone Encounter (Signed)
I spoke with pt and she is coming in at 2:00 w/ cxr prior--TD spoke with PW and okay'd for pt to come in at this time since it is pt lunch hr

## 2012-06-26 NOTE — Progress Notes (Signed)
Subjective:    Patient ID: [name], female    DOB: Apr 09, 1941, 71 y.o.   MRN: [id number]  HPI  71 y.o.  WF with RML lung abscess no other risk factors and neg FOB for endobronchial lesion 9/11. Mild COPD ex smoker 60pack years. Normal FeV1, Fef25-75 39% predicted    06/26/2012 Notes two day hx of low grade fever, cough increased prod dark green, more dyspneic.  Notes sl soreness in chest area. Pt denies any significant sore throat, nasal congestion or excess secretions, fever, chills, sweats, unintended weight loss, pleurtic or exertional chest pain, orthopnea PND, or leg swelling Pt denies any increase in rescue therapy over baseline, denies waking up needing it or having any early am or nocturnal exacerbations of coughing/wheezing/or dyspnea. Pt also denies any obvious fluctuation in symptoms with  weather or environmental change or other alleviating or aggravating factors   Past Medical History  Diagnosis Date  . ANEMIA 08/28/2010  . 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 COLONIC POLYPS, HX OF 06/15/2007  . 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 CORONARY ARTERY DISEASE 06/15/2007  . DEPRESSIVE DISORDER 06/06/2008  . GERD 12/19/2009  . HYPERLIPIDEMIA 06/15/2007  . HYPERTENSION 06/15/2007  . HYPOTENSION 08/11/2010  . OSTEOARTHRITIS 06/15/2007  . PEDAL EDEMA 06/06/2009  . PERIPHERAL VASCULAR DISEASE 06/15/2007  . Lumbar stenosis   . Lung abscess 2011  . CHF (congestive heart failure)   . Hx of colonoscopy      Family History  Problem Relation Age of Onset  . Diabetes Father   . Heart attack Mother   . Diabetes    . Coronary artery disease    . Colon cancer Neg Hx      History   Social History  . Marital Status: Widowed    Spouse Name: N/A    Number of Children: 2  . 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 Smoking status: Former Smoker -- 1.5 packs/day for 40 years    Types: Cigarettes    Quit date:  06/29/2000  . Smokeless tobacco: Never Used  . Alcohol Use: No  . Drug Use: No  . Sexually Active: Not on file   Other Topics Concern  . Not on file   Social History Narrative  . No narrative on file     Allergies  Allergen Reactions  . Codeine Phosphate     REACTION: unspecified     Outpatient Prescriptions Prior to Visit  Medication Sig Dispense Refill  . acetaminophen (TYLENOL) 325 MG tablet Take 650 mg by mouth every 6 (six) hours as needed.        Marland Kitchen aspirin 81 MG tablet Take 81 mg by mouth daily.        . chlorthalidone (HYGROTON) 25 MG tablet TAKE 1 TABLET BY MOUTH DAILY  90 tablet  1  . cilostazol (PLETAL) 100 MG tablet TAKE 1 TABLET BY MOUTH 2 TIMES A DAY  180 tablet  1  . felodipine (PLENDIL) 5 MG 24 hr tablet Take 1 tablet (5 mg total) by mouth daily.  90 tablet  6  . gabapentin (NEURONTIN) 600 MG tablet Take 600 mg by mouth 3 (three) times daily.        Marland Kitchen HYDROcodone-acetaminophen (VICODIN) 5-500 MG per tablet 1/2 tablet six times daily  90 tablet  3  . isosorbide mononitrate (IMDUR) 30 MG 24 hr tablet TAKE 1 TABLET BY MOUTH EVERY DAY  90 tablet  3  . LORazepam (ATIVAN) 0.5 MG tablet Take 1 tablet (0.5 mg total) by mouth 2 (two) times daily as needed.  60 tablet  1  . nitroGLYCERIN (NITROSTAT) 0.4 MG SL tablet Place 1 tablet (0.4 mg total) under the tongue every 5 (five) minutes as needed for chest pain.  100 tablet  3  . Omega-3 Fatty Acids (FISH OIL) 1000 MG CAPS Take 1 capsule by mouth daily.        Marland Kitchen omeprazole (PRILOSEC) 20 MG capsule Take 20 mg by mouth 2 (two) times daily.       . promethazine (PHENERGAN) 25 MG tablet TAKE ONE TABLET BY MOUTH EVERY 6 HOURS FOR NAUSEA  20 tablet  0  . ramipril (ALTACE) 5 MG capsule TAKE ONE CAPSULE BY MOUTH EVERY DAY  90 capsule  3  . sertraline (ZOLOFT) 50 MG tablet TAKE 1/2 TABLET BY MOUTH AT BEDTIME  90 tablet  1  . simvastatin (ZOCOR) 80 MG tablet TAKE 1 TABLET BY MOUTH EVERY DAY  90 tablet  3  . tiotropium (SPIRIVA  HANDIHALER) 18 MCG inhalation capsule Place 1 capsule (18 mcg total) into inhaler and inhale daily.  30 capsule  11      Review of Systems  Constitutional:   No  weight loss, night sweats,  Fevers, chills, fatigue, lassitude. HEENT:   No headaches,  Difficulty swallowing,  Tooth/dental problems,  Sore throat,                No sneezing, itching, ear ache, nasal congestion, post nasal drip,   CV:  No chest pain,  Orthopnea, PND, swelling in lower extremities, anasarca, dizziness, palpitations  GI  Notes  heartburn, notes indigestion, abdominal pain, nausea, vomiting, diarrhea, change in bowel habits, loss of appetite  Resp: Notes  shortness of breath with exertion not at rest.  No excess mucus, notes  productive cough,  No non-productive cough,  No coughing up of blood.  Notes change in color of mucus.  No wheezing.  No chest wall deformity  Skin: no rash or lesions.  GU: no dysuria, change in color of urine, no urgency or frequency.  No flank pain.  MS:  No joint pain or swelling.  No decreased range of motion.  No back pain.  Psych:  No change in mood or affect. No depression or anxiety.  No memory loss.     Objective:   Physical Exam  Filed Vitals:   06/26/12 1416  BP: 122/64  Pulse: 97  Temp: 98.4 F (36.9 C)  TempSrc: Oral  Height: 5' 2.5" (1.588 m)  Weight: 155 lb (70.308 kg)  SpO2: 93%    Gen: Pleasant,  in no distress,  normal affect  ENT: No lesions,  mouth clear,  oropharynx clear, no postnasal drip  Neck: No JVD, no TMG, no carotid bruits  Lungs: No use of accessory muscles, no dullness to percussion, Coarse BS   Cardiovascular: RRR, heart sounds normal, no murmur or gallops, no peripheral edema  Abdomen: soft and NT, no HSM,  BS normal  Musculoskeletal: No deformities, no cyanosis or clubbing  Neuro: alert, non focal  Skin: Warm, no lesions or rashes     Dg Chest 2 View  06/26/2012  *RADIOLOGY REPORT*  Clinical Data: Cough, shortness of breath  and hypertension  CHEST - 2 VIEW  Comparison: 01/17/2012  Findings: Heart size is normal.  No pleural effusion or edema.  Patchy areas of airspace consolidation in the left  midlung and left base are identified.  Right lung appears clear.  The visualized osseous structures are unremarkable.  IMPRESSION:  1.  Left midlung and left base pneumonia.  Original Report Authenticated By: Rosealee Albee, M.D.    Assessment & Plan:   COPD, MILD CAT 12 06/26/2012   Pneumonia Left upper and lower lobe community acquired pneumonia with chronic obstructive lung disease flare no organism specified Plan Avelox 4 mg daily for 7 days Continued inhaled medications as prescribed Return 1 week with repeat chest x-ray    Updated Medication List Outpatient Encounter Prescriptions as of 06/26/2012  Medication Sig Dispense Refill  . acetaminophen (TYLENOL) 325 MG tablet Take 650 mg by mouth every 6 (six) hours as needed.        Marland Kitchen aspirin 81 MG tablet Take 81 mg by mouth daily.        . Chlorpheniramine-Acetaminophen (CORICIDIN HBP COLD/FLU PO) Take 1 tablet by mouth daily as needed.      . chlorthalidone (HYGROTON) 25 MG tablet TAKE 1 TABLET BY MOUTH DAILY  90 tablet  1  . cilostazol (PLETAL) 100 MG tablet TAKE 1 TABLET BY MOUTH 2 TIMES A DAY  180 tablet  1  . felodipine (PLENDIL) 5 MG 24 hr tablet Take 1 tablet (5 mg total) by mouth daily.  90 tablet  6  . gabapentin (NEURONTIN) 600 MG tablet Take 600 mg by mouth 3 (three) times daily.        Marland Kitchen HYDROcodone-acetaminophen (VICODIN) 5-500 MG per tablet 1/2 tablet six times daily  90 tablet  3  . isosorbide mononitrate (IMDUR) 30 MG 24 hr tablet TAKE 1 TABLET BY MOUTH EVERY DAY  90 tablet  3  . LORazepam (ATIVAN) 0.5 MG tablet Take 1 tablet (0.5 mg total) by mouth 2 (two) times daily as needed.  60 tablet  1  . nitroGLYCERIN (NITROSTAT) 0.4 MG SL tablet Place 1 tablet (0.4 mg total) under the tongue every 5 (five) minutes as needed for chest pain.  100 tablet  3    . Omega-3 Fatty Acids (FISH OIL) 1000 MG CAPS Take 1 capsule by mouth daily.        Marland Kitchen omeprazole (PRILOSEC) 20 MG capsule Take 20 mg by mouth 2 (two) times daily.       . promethazine (PHENERGAN) 25 MG tablet TAKE ONE TABLET BY MOUTH EVERY 6 HOURS FOR NAUSEA  20 tablet  0  . ramipril (ALTACE) 5 MG capsule TAKE ONE CAPSULE BY MOUTH EVERY DAY  90 capsule  3  . sertraline (ZOLOFT) 50 MG tablet TAKE 1/2 TABLET BY MOUTH AT BEDTIME  90 tablet  1  . simvastatin (ZOCOR) 80 MG tablet TAKE 1 TABLET BY MOUTH EVERY DAY  90 tablet  3  . tiotropium (SPIRIVA HANDIHALER) 18 MCG inhalation capsule Place 1 capsule (18 mcg total) into inhaler and inhale daily.  30 capsule  11

## 2012-06-26 NOTE — Patient Instructions (Addendum)
Avelox one daily Stay on Spiriva Return one week for recheck with Tammy Parrett and Chest xray

## 2012-06-26 NOTE — Telephone Encounter (Signed)
Called and spoke with patient.  Patient complaining of feeling like shes coming down with a cold x1wk.  States she has also been coughing up clear to dark green mucus with increased pain on left side of chest.  Patient requesting Chest Xray to endure nothing is going on.  Would like xray done today, Dr. Delford Field please advise.

## 2012-06-26 NOTE — Telephone Encounter (Signed)
Work her in to my 130pm slot today with CXR first

## 2012-06-27 ENCOUNTER — Encounter: Payer: Self-pay | Admitting: Vascular Surgery

## 2012-07-03 ENCOUNTER — Other Ambulatory Visit: Payer: Self-pay | Admitting: Adult Health

## 2012-07-03 ENCOUNTER — Encounter: Payer: Self-pay | Admitting: Adult Health

## 2012-07-03 ENCOUNTER — Ambulatory Visit (INDEPENDENT_AMBULATORY_CARE_PROVIDER_SITE_OTHER)
Admission: RE | Admit: 2012-07-03 | Discharge: 2012-07-03 | Disposition: A | Discharge status: Home / Self Care | Payer: Medicare Other | Source: Ambulatory Visit | Attending: Adult Health | Admitting: Adult Health

## 2012-07-03 ENCOUNTER — Ambulatory Visit (INDEPENDENT_AMBULATORY_CARE_PROVIDER_SITE_OTHER): Payer: Medicare Other | Admitting: Adult Health

## 2012-07-03 VITALS — BP 136/72 | HR 86 | Temp 97.4°F | Ht 62.5 in | Wt 152.0 lb

## 2012-07-03 DIAGNOSIS — J189 Pneumonia, unspecified organism: Secondary | ICD-10-CM

## 2012-07-03 DIAGNOSIS — J449 Chronic obstructive pulmonary disease, unspecified: Secondary | ICD-10-CM

## 2012-07-03 NOTE — Progress Notes (Signed)
Subjective:    Patient ID: [name], female    DOB: [birth year], 71 y.o.   MRN: [id number]  HPI  71 y.o.  WF with RML lung abscess no other risk factors and neg FOB for endobronchial lesion 9/11. Mild COPD ex smoker 60pack years. Normal FeV1, Fef25-75 39% predicted    06/26/2012 Notes two day hx of low grade fever, cough increased prod dark green, more dyspneic.  Notes sl soreness in chest area. >>Dx CAP , xray showed Left midlung and left base pneumonia. tx w/ Avelox x 7 d   07/03/2012 1 week follow up for PNA  Returns for 1 week follow up .  Seen last week for acute OV w/ productive cough.  CXR showed Left midlung and left base pneumonia. Tx w/ Avelox x 1 week. -1 day left. No diarrhea.  She is feeling better. Cough is better. Mucus clear.  Xray today shows Improving left lung infiltrates. A nodular component remains in the left perihilar region. No hemoptysis or edema. 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    Past Medical History  Diagnosis Date  . ANEMIA 08/28/2010  . 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 COLONIC POLYPS, HX OF 06/15/2007  . 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 CORONARY ARTERY DISEASE 06/15/2007  . DEPRESSIVE DISORDER 06/06/2008  . GERD 12/19/2009  . HYPERLIPIDEMIA 06/15/2007  . HYPERTENSION 06/15/2007  . HYPOTENSION 08/11/2010  . OSTEOARTHRITIS 06/15/2007  . PEDAL EDEMA 06/06/2009  . PERIPHERAL VASCULAR DISEASE 06/15/2007  . Lumbar stenosis   . Lung abscess 2011  . CHF (congestive heart failure)   . Hx of colonoscopy      Family History  Problem Relation Age of Onset  . Diabetes Father   . Heart attack Mother   . Diabetes    . Coronary artery disease    . Colon cancer Neg Hx      History   Social History  . Marital Status: Widowed    Spouse Name: N/A    Number of Children: 2  . 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 Smoking status: Former Smoker -- 1.5 packs/day for  40 years    Types: Cigarettes    Quit date: 06/29/2000  . Smokeless tobacco: Never Used  . Alcohol Use: No  . Drug Use: No  . Sexually Active: Not on file   Other Topics Concern  . Not on file   Social History Narrative  . No narrative on file     Allergies  Allergen Reactions  . Codeine Phosphate     REACTION: unspecified     Outpatient Prescriptions Prior to Visit  Medication Sig Dispense Refill  . acetaminophen (TYLENOL) 325 MG tablet Take 650 mg by mouth every 6 (six) hours as needed.        Marland Kitchen aspirin 81 MG tablet Take 81 mg by mouth daily.        . Chlorpheniramine-Acetaminophen (CORICIDIN HBP COLD/FLU PO) Take 1 tablet by mouth daily as needed.      . chlorthalidone (HYGROTON) 25 MG tablet TAKE 1 TABLET BY MOUTH DAILY  90 tablet  1  . cilostazol (PLETAL) 100 MG tablet TAKE 1 TABLET BY MOUTH 2 TIMES A DAY  180 tablet  1  . felodipine (PLENDIL) 5 MG 24 hr tablet Take 1 tablet (5 mg total) by mouth daily.  90 tablet  6  . gabapentin (NEURONTIN)  600 MG tablet Take 600 mg by mouth 3 (three) times daily.        Marland Kitchen HYDROcodone-acetaminophen (VICODIN) 5-500 MG per tablet 1/2 tablet six times daily  90 tablet  3  . isosorbide mononitrate (IMDUR) 30 MG 24 hr tablet TAKE 1 TABLET BY MOUTH EVERY DAY  90 tablet  3  . LORazepam (ATIVAN) 0.5 MG tablet Take 1 tablet (0.5 mg total) by mouth 2 (two) times daily as needed.  60 tablet  1  . nitroGLYCERIN (NITROSTAT) 0.4 MG SL tablet Place 1 tablet (0.4 mg total) under the tongue every 5 (five) minutes as needed for chest pain.  100 tablet  3  . Omega-3 Fatty Acids (FISH OIL) 1000 MG CAPS Take 1 capsule by mouth daily.        Marland Kitchen omeprazole (PRILOSEC) 20 MG capsule Take 20 mg by mouth 2 (two) times daily.       . promethazine (PHENERGAN) 25 MG tablet TAKE ONE TABLET BY MOUTH EVERY 6 HOURS FOR NAUSEA  20 tablet  0  . ramipril (ALTACE) 5 MG capsule TAKE ONE CAPSULE BY MOUTH EVERY DAY  90 capsule  3  . sertraline (ZOLOFT) 50 MG tablet TAKE 1/2  TABLET BY MOUTH AT BEDTIME  90 tablet  1  . simvastatin (ZOCOR) 80 MG tablet TAKE 1 TABLET BY MOUTH EVERY DAY  90 tablet  3  . tiotropium (SPIRIVA HANDIHALER) 18 MCG inhalation capsule Place 1 capsule (18 mcg total) into inhaler and inhale daily.  30 capsule  11      Review of Systems  Constitutional:   No  weight loss, night sweats,  Fevers, chills,  +fatigue, lassitude. HEENT:   No headaches,  Difficulty swallowing,  Tooth/dental problems,  Sore throat,                No sneezing, itching, ear ache, nasal congestion, post nasal drip,   CV:  No chest pain,  Orthopnea, PND, swelling in lower extremities, anasarca, dizziness, palpitations  GI  Notes  heartburn, notes indigestion, abdominal pain, nausea, vomiting, diarrhea, change in bowel habits, loss of appetite  Resp:    No coughing up of blood.  Notes change in color of mucus.  No wheezing.  No chest wall deformity  Skin: no rash or lesions.  GU: no dysuria, change in color of urine, no urgency or frequency.  No flank pain.  MS:  No joint pain or swelling.  No decreased range of motion.     Psych:  No change in mood or affect. No depression or anxiety.  No memory loss.     Objective:   Physical Exam  Filed Vitals:   07/03/12 1453  Height: 5' 2.5" (1.588 m)  Weight: 152 lb (68.947 kg)    Gen: Pleasant,  in no distress,  normal affect  ENT: No lesions,  mouth clear,  oropharynx clear, no postnasal drip  Neck: No JVD, no TMG, no carotid bruits  Lungs: No use of accessory muscles, no dullness to percussion, Coarse BS   Cardiovascular: RRR, heart sounds normal, no murmur or gallops, no peripheral edema  Abdomen: soft and NT, no HSM,  BS normal  Musculoskeletal: No deformities, no cyanosis or clubbing  Neuro: alert, non focal  Skin: Warm, no lesions or rashes     Dg Chest 2 View  07/03/2012  *RADIOLOGY REPORT*  Clinical Data: Follow up pneumonia.  Clinically improving symptoms. History of smoking and COPD.  CHEST  - 2 VIEW  Comparison:  06/26/2012 and 01/17/2012 radiographs.  Findings: The heart size and mediastinal contours are stable status post CABG.  The patchy left basilar air space disease has nearly completely resolved.  There is residual left perihilar nodularity on the frontal examination which has mildly improved.  Vague density overlapping the anterior aspect of the right second rib on the frontal examination is unchanged from the earlier examination. There is no new airspace disease or significant pleural effusion.  IMPRESSION: Improving left lung infiltrates.  A nodular component remains in the left perihilar region.  Continued follow-up is recommended to document complete resolution.  Original Report Authenticated By: Gerrianne Scale, M.D.    Assessment & Plan:   No problem-specific assessment & plan notes found for this encounter.   Updated Medication List Outpatient Encounter Prescriptions as of 07/03/2012  Medication Sig Dispense Refill  . acetaminophen (TYLENOL) 325 MG tablet Take 650 mg by mouth every 6 (six) hours as needed.        Marland Kitchen aspirin 81 MG tablet Take 81 mg by mouth daily.        Marland Kitchen azithromycin (ZITHROMAX) 250 MG tablet Take 250 mg by mouth daily.      . Chlorpheniramine-Acetaminophen (CORICIDIN HBP COLD/FLU PO) Take 1 tablet by mouth daily as needed.      . chlorthalidone (HYGROTON) 25 MG tablet TAKE 1 TABLET BY MOUTH DAILY  90 tablet  1  . cilostazol (PLETAL) 100 MG tablet TAKE 1 TABLET BY MOUTH 2 TIMES A DAY  180 tablet  1  . felodipine (PLENDIL) 5 MG 24 hr tablet Take 1 tablet (5 mg total) by mouth daily.  90 tablet  6  . gabapentin (NEURONTIN) 600 MG tablet Take 600 mg by mouth 3 (three) times daily.        Marland Kitchen HYDROcodone-acetaminophen (VICODIN) 5-500 MG per tablet 1/2 tablet six times daily  90 tablet  3  . isosorbide mononitrate (IMDUR) 30 MG 24 hr tablet TAKE 1 TABLET BY MOUTH EVERY DAY  90 tablet  3  . LORazepam (ATIVAN) 0.5 MG tablet Take 1 tablet (0.5 mg total) by  mouth 2 (two) times daily as needed.  60 tablet  1  . nitroGLYCERIN (NITROSTAT) 0.4 MG SL tablet Place 1 tablet (0.4 mg total) under the tongue every 5 (five) minutes as needed for chest pain.  100 tablet  3  . Omega-3 Fatty Acids (FISH OIL) 1000 MG CAPS Take 1 capsule by mouth daily.        Marland Kitchen omeprazole (PRILOSEC) 20 MG capsule Take 20 mg by mouth 2 (two) times daily.       . promethazine (PHENERGAN) 25 MG tablet TAKE ONE TABLET BY MOUTH EVERY 6 HOURS FOR NAUSEA  20 tablet  0  . ramipril (ALTACE) 5 MG capsule TAKE ONE CAPSULE BY MOUTH EVERY DAY  90 capsule  3  . sertraline (ZOLOFT) 50 MG tablet TAKE 1/2 TABLET BY MOUTH AT BEDTIME  90 tablet  1  . simvastatin (ZOCOR) 80 MG tablet TAKE 1 TABLET BY MOUTH EVERY DAY  90 tablet  3  . tiotropium (SPIRIVA HANDIHALER) 18 MCG inhalation capsule Place 1 capsule (18 mcg total) into inhaler and inhale daily.  30 capsule  11

## 2012-07-03 NOTE — Assessment & Plan Note (Signed)
CAP clinically improving w/ xray showing decreasing infiltrates w/ Avelox   Plan   Finish avelox  follow up Dr. Delford Field  In 3 weeks with xray  Please contact office for sooner follow up if symptoms do not improve or worsen or seek emergency care

## 2012-07-03 NOTE — Assessment & Plan Note (Signed)
Compensated on present regimen.   

## 2012-07-03 NOTE — Patient Instructions (Addendum)
Finish Avelox as directed  Continue on current regimen.  follow up  Dr. Delford Field  In 3-4 weeks with chest xray  Please contact office for sooner follow up if symptoms do not improve or worsen or seek emergency care

## 2012-07-21 ENCOUNTER — Encounter: Payer: Self-pay | Admitting: *Deleted

## 2012-07-21 ENCOUNTER — Telehealth: Payer: Self-pay | Admitting: Critical Care Medicine

## 2012-07-21 NOTE — Telephone Encounter (Signed)
2 samples at front. Pt is aware. [name], CMA  

## 2012-08-03 ENCOUNTER — Other Ambulatory Visit: Payer: Self-pay

## 2012-08-03 MED ORDER — LORAZEPAM 0.5 MG PO TABS: 0.5000 mg | ORAL_TABLET | Freq: Two times a day (BID) | ORAL | Status: DC | PRN

## 2012-08-09 ENCOUNTER — Ambulatory Visit (INDEPENDENT_AMBULATORY_CARE_PROVIDER_SITE_OTHER)
Admission: RE | Admit: 2012-08-09 | Discharge: 2012-08-09 | Disposition: A | Discharge status: Home / Self Care | Payer: Medicare Other | Source: Ambulatory Visit | Attending: Critical Care Medicine | Admitting: Critical Care Medicine

## 2012-08-09 ENCOUNTER — Ambulatory Visit (INDEPENDENT_AMBULATORY_CARE_PROVIDER_SITE_OTHER): Payer: Medicare Other | Admitting: Critical Care Medicine

## 2012-08-09 ENCOUNTER — Encounter: Payer: Self-pay | Admitting: Critical Care Medicine

## 2012-08-09 ENCOUNTER — Other Ambulatory Visit: Payer: Self-pay | Admitting: Critical Care Medicine

## 2012-08-09 VITALS — BP 138/68 | HR 89 | Temp 98.0°F | Ht 62.5 in | Wt 155.2 lb

## 2012-08-09 DIAGNOSIS — J449 Chronic obstructive pulmonary disease, unspecified: Secondary | ICD-10-CM

## 2012-08-09 DIAGNOSIS — J189 Pneumonia, unspecified organism: Secondary | ICD-10-CM

## 2012-08-09 DIAGNOSIS — Z23 Encounter for immunization: Secondary | ICD-10-CM

## 2012-08-09 NOTE — Assessment & Plan Note (Signed)
Bilateral upper lobe infiltrates due to pneumonia now improving with current chest x-ray Plan No additional antibiotics indicated Return 2 months with repeat chest x-ray

## 2012-08-09 NOTE — Assessment & Plan Note (Signed)
[name]. COPD stable at this time Plan Maintain Spiriva Administer flu vaccine

## 2012-08-09 NOTE — Progress Notes (Signed)
Subjective:    Patient ID: [name], female    DOB: 01-21-1941, 71 y.o.   MRN: [id number]  HPI  71 y.o.  WF with RML lung abscess no other risk factors and neg FOB for endobronchial lesion 9/11. Mild COPD ex smoker 60pack years. Normal FeV1, Fef25-75 39% predicted    06/26/2012 Notes two day hx of low grade fever, cough increased prod dark green, more dyspneic.  Notes sl soreness in chest area. >>Dx CAP , xray showed Left midlung and left base pneumonia. tx w/ Avelox x 7 d   8/5 1 week follow up for PNA  Returns for 1 week follow up .  Seen last week for acute OV w/ productive cough.  CXR showed Left midlung and left base pneumonia. Tx w/ Avelox x 1 week. -1 day left. No diarrhea.  She is feeling better. Cough is better. Mucus clear.  Xray today shows Improving left lung infiltrates. A nodular component remains in the left perihilar region. No hemoptysis or edema. No fever  Lost her job -worked fulltime at [employer] .   08/09/2012 Pt lost her job.  Pt is feeling better.  Dyspnea is better.  No real cough  Pt denies any significant sore throat, nasal congestion or excess secretions, fever, chills, sweats, unintended weight loss, pleurtic or exertional chest pain, orthopnea PND, or leg swelling Pt denies any increase in rescue therapy over baseline, denies waking up needing it or having any early am or nocturnal exacerbations of coughing/wheezing/or dyspnea. Pt also denies any obvious fluctuation in symptoms with  weather or environmental change or other alleviating or aggravating factors   Past Medical History  Diagnosis Date  . ANEMIA 08/28/2010  . 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 COLONIC POLYPS, HX OF 06/15/2007  . 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 CORONARY ARTERY DISEASE 06/15/2007  . DEPRESSIVE DISORDER 06/06/2008  . GERD 12/19/2009  . HYPERLIPIDEMIA 06/15/2007  . HYPERTENSION 06/15/2007  . HYPOTENSION 08/11/2010  . OSTEOARTHRITIS 06/15/2007  . PEDAL EDEMA 06/06/2009  . PERIPHERAL  VASCULAR DISEASE 06/15/2007  . Lumbar stenosis   . Lung abscess 2011  . CHF (congestive heart failure)   . Hx of colonoscopy      Family History  Problem Relation Age of Onset  . Diabetes Father   . Heart attack Mother   . Diabetes    . Coronary artery disease    . Colon cancer Neg Hx      History   Social History  . Marital Status: Widowed    Spouse Name: N/A    Number of Children: 2  . Years of Education: N/A   Occupational History  .     Social History Main Topics  . Smoking status: Former Smoker -- 1.5 packs/day for 40 years    Types: Cigarettes    Quit date: 06/29/2000  . Smokeless tobacco: Never Used  . Alcohol Use: No  . Drug Use: No  . Sexually Active: Not on file   Other Topics Concern  . Not on file   Social History Narrative  . No narrative on file     Allergies  Allergen Reactions  . Codeine Phosphate     REACTION: unspecified     Outpatient Prescriptions Prior to Visit  Medication Sig Dispense Refill  . acetaminophen (TYLENOL) 325 MG tablet Take 650 mg by mouth every 6 (six) hours as needed.        Marland Kitchen aspirin 81 MG tablet Take 81 mg by mouth daily.        Marland Kitchen  Chlorpheniramine-Acetaminophen (CORICIDIN HBP COLD/FLU PO) Take 1 tablet by mouth daily as needed.      . chlorthalidone (HYGROTON) 25 MG tablet TAKE 1 TABLET BY MOUTH DAILY  90 tablet  1  . cilostazol (PLETAL) 100 MG tablet TAKE 1 TABLET BY MOUTH 2 TIMES A DAY  180 tablet  1  . felodipine (PLENDIL) 5 MG 24 hr tablet Take 1 tablet (5 mg total) by mouth daily.  90 tablet  6  . gabapentin (NEURONTIN) 600 MG tablet Take 600 mg by mouth 3 (three) times daily.        Marland Kitchen HYDROcodone-acetaminophen (VICODIN) 5-500 MG per tablet 1/2 tablet six times daily  90 tablet  3  . isosorbide mononitrate (IMDUR) 30 MG 24 hr tablet TAKE 1 TABLET BY MOUTH EVERY DAY  90 tablet  3  . LORazepam (ATIVAN) 0.5 MG tablet Take 1 tablet (0.5 mg total) by mouth 2 (two) times daily as needed.  60 tablet  1  . nitroGLYCERIN  (NITROSTAT) 0.4 MG SL tablet Place 1 tablet (0.4 mg total) under the tongue every 5 (five) minutes as needed for chest pain.  100 tablet  3  . Omega-3 Fatty Acids (FISH OIL) 1000 MG CAPS Take 1 capsule by mouth daily.        Marland Kitchen omeprazole (PRILOSEC) 20 MG capsule Take 20 mg by mouth 2 (two) times daily.       . promethazine (PHENERGAN) 25 MG tablet TAKE ONE TABLET BY MOUTH EVERY 6 HOURS FOR NAUSEA  20 tablet  0  . ramipril (ALTACE) 5 MG capsule TAKE ONE CAPSULE BY MOUTH EVERY DAY  90 capsule  3  . sertraline (ZOLOFT) 50 MG tablet TAKE 1/2 TABLET BY MOUTH AT BEDTIME  90 tablet  1  . simvastatin (ZOCOR) 80 MG tablet TAKE 1 TABLET BY MOUTH EVERY DAY  90 tablet  3  . tiotropium (SPIRIVA HANDIHALER) 18 MCG inhalation capsule Place 1 capsule (18 mcg total) into inhaler and inhale daily.  30 capsule  11  . azithromycin (ZITHROMAX) 250 MG tablet Take 250 mg by mouth daily.          Review of Systems  Constitutional:   No  weight loss, night sweats,  Fevers, chills,  +fatigue, lassitude. HEENT:   No headaches,  Difficulty swallowing,  Tooth/dental problems,  Sore throat,                No sneezing, itching, ear ache, nasal congestion, post nasal drip,   CV:  No chest pain,  Orthopnea, PND, swelling in lower extremities, anasarca, dizziness, palpitations  GI  Notes  heartburn, notes indigestion, abdominal pain, nausea, vomiting, diarrhea, change in bowel habits, loss of appetite  Resp:    No coughing up of blood.  Notes change in color of mucus.  No wheezing.  No chest wall deformity  Skin: no rash or lesions.  GU: no dysuria, change in color of urine, no urgency or frequency.  No flank pain.  MS:  No joint pain or swelling.  No decreased range of motion.     Psych:  No change in mood or affect. No depression or anxiety.  No memory loss.     Objective:   Physical Exam  Filed Vitals:   08/09/12 0920  BP: 138/68  Pulse: 89  Temp: 98 F (36.7 C)  TempSrc: Oral  Height: 5' 2.5" (1.588  m)  Weight: 155 lb 3.2 oz (70.398 kg)  SpO2: 91%    Gen: Pleasant,  in no distress,  normal affect  ENT: No lesions,  mouth clear,  oropharynx clear, no postnasal drip  Neck: No JVD, no TMG, no carotid bruits  Lungs: No use of accessory muscles, no dullness to percussion, Coarse BS   Cardiovascular: RRR, heart sounds normal, no murmur or gallops, no peripheral edema  Abdomen: soft and NT, no HSM,  BS normal  Musculoskeletal: No deformities, no cyanosis or clubbing  Neuro: alert, non focal  Skin: Warm, no lesions or rashes     Dg Chest 2 View  08/09/2012  *RADIOLOGY REPORT*  Clinical Data: Follow up pneumonia  CHEST - 2 VIEW  Comparison: 07/03/2012  Findings: Patchy bilateral perihilar opacities have improved. Normal heart size.  Residual density persists bilaterally no pneumothorax.  No pleural effusion.  Increased AP diameter chest and interstitial changes are chronic.  IMPRESSION: Improving bilateral patchy airspace opacities.  Continued follow up until complete resolution is recommended.   Original Report Authenticated By: Donavan Burnet, M.D.     Assessment & Plan:   Pneumonia Bilateral upper lobe infiltrates due to pneumonia now improving with current chest x-ray Plan No additional antibiotics indicated Return 2 months with repeat chest x-ray  COPD, MILD Gold B. COPD stable at this time Plan Maintain Spiriva Administer flu vaccine    Updated Medication List Outpatient Encounter Prescriptions as of 08/09/2012  Medication Sig Dispense Refill  . acetaminophen (TYLENOL) 325 MG tablet Take 650 mg by mouth every 6 (six) hours as needed.        Marland Kitchen aspirin 81 MG tablet Take 81 mg by mouth daily.        . Chlorpheniramine-Acetaminophen (CORICIDIN HBP COLD/FLU PO) Take 1 tablet by mouth daily as needed.      . chlorthalidone (HYGROTON) 25 MG tablet TAKE 1 TABLET BY MOUTH DAILY  90 tablet  1  . cilostazol (PLETAL) 100 MG tablet TAKE 1 TABLET BY MOUTH 2 TIMES A DAY  180  tablet  1  . felodipine (PLENDIL) 5 MG 24 hr tablet Take 1 tablet (5 mg total) by mouth daily.  90 tablet  6  . gabapentin (NEURONTIN) 600 MG tablet Take 600 mg by mouth 3 (three) times daily.        Marland Kitchen HYDROcodone-acetaminophen (VICODIN) 5-500 MG per tablet 1/2 tablet six times daily  90 tablet  3  . isosorbide mononitrate (IMDUR) 30 MG 24 hr tablet TAKE 1 TABLET BY MOUTH EVERY DAY  90 tablet  3  . LORazepam (ATIVAN) 0.5 MG tablet Take 1 tablet (0.5 mg total) by mouth 2 (two) times daily as needed.  60 tablet  1  . nitroGLYCERIN (NITROSTAT) 0.4 MG SL tablet Place 1 tablet (0.4 mg total) under the tongue every 5 (five) minutes as needed for chest pain.  100 tablet  3  . Omega-3 Fatty Acids (FISH OIL) 1000 MG CAPS Take 1 capsule by mouth daily.        Marland Kitchen omeprazole (PRILOSEC) 20 MG capsule Take 20 mg by mouth 2 (two) times daily.       . promethazine (PHENERGAN) 25 MG tablet TAKE ONE TABLET BY MOUTH EVERY 6 HOURS FOR NAUSEA  20 tablet  0  . ramipril (ALTACE) 5 MG capsule TAKE ONE CAPSULE BY MOUTH EVERY DAY  90 capsule  3  . sertraline (ZOLOFT) 50 MG tablet TAKE 1/2 TABLET BY MOUTH AT BEDTIME  90 tablet  1  . simvastatin (ZOCOR) 80 MG tablet TAKE 1 TABLET BY MOUTH EVERY DAY  90  tablet  3  . tiotropium (SPIRIVA HANDIHALER) 18 MCG inhalation capsule Place 1 capsule (18 mcg total) into inhaler and inhale daily.  30 capsule  11  . DISCONTD: azithromycin (ZITHROMAX) 250 MG tablet Take 250 mg by mouth daily.

## 2012-08-09 NOTE — Patient Instructions (Signed)
No change in medications. Return in       2 months for recheck and Chest xray        Flu vaccine today

## 2012-08-14 ENCOUNTER — Ambulatory Visit (INDEPENDENT_AMBULATORY_CARE_PROVIDER_SITE_OTHER): Payer: Medicare Other | Admitting: Cardiology

## 2012-08-14 ENCOUNTER — Encounter: Payer: Self-pay | Admitting: Cardiology

## 2012-08-14 VITALS — BP 142/58 | HR 87 | Ht 62.0 in | Wt 157.4 lb

## 2012-08-14 DIAGNOSIS — E785 Hyperlipidemia, unspecified: Secondary | ICD-10-CM

## 2012-08-14 DIAGNOSIS — I779 Disorder of arteries and arterioles, unspecified: Secondary | ICD-10-CM

## 2012-08-14 DIAGNOSIS — I739 Peripheral vascular disease, unspecified: Secondary | ICD-10-CM

## 2012-08-14 DIAGNOSIS — I10 Essential (primary) hypertension: Secondary | ICD-10-CM

## 2012-08-14 DIAGNOSIS — I2510 Atherosclerotic heart disease of native coronary artery without angina pectoris: Secondary | ICD-10-CM

## 2012-08-14 DIAGNOSIS — I679 Cerebrovascular disease, unspecified: Secondary | ICD-10-CM

## 2012-08-14 DIAGNOSIS — I25810 Atherosclerosis of coronary artery bypass graft(s) without angina pectoris: Secondary | ICD-10-CM

## 2012-08-14 NOTE — Progress Notes (Signed)
HPI [name] turns today for the evaluation of her complex diffuse vascular disease.  He reports no angina or increased dyspnea on exertion. She's had no symptoms of TIAs or mini strokes. She does have some claudication right calf about 8-10 minutes and exercise. It goes away with rest and does not returned.  She seems to be compliant with her meds.  Past Medical History  Diagnosis Date  . ANEMIA 08/28/2010  . 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 COLONIC POLYPS, HX OF 06/15/2007  . 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 CORONARY ARTERY DISEASE 06/15/2007  . DEPRESSIVE DISORDER 06/06/2008  . GERD 12/19/2009  . HYPERLIPIDEMIA 06/15/2007  . HYPERTENSION 06/15/2007  . HYPOTENSION 08/11/2010  . OSTEOARTHRITIS 06/15/2007  . PEDAL EDEMA 06/06/2009  . PERIPHERAL VASCULAR DISEASE 06/15/2007  . Lumbar stenosis   . Lung abscess 2011  . CHF (congestive heart failure)   . Hx of colonoscopy     Current Outpatient Prescriptions  Medication Sig Dispense Refill  . Chlorpheniramine-Acetaminophen (CORICIDIN HBP COLD/FLU PO) Take 1 tablet by mouth daily as needed.      . chlorthalidone (HYGROTON) 25 MG tablet TAKE 1 TABLET BY MOUTH DAILY  90 tablet  1  . cilostazol (PLETAL) 100 MG tablet TAKE 1 TABLET BY MOUTH 2 TIMES A DAY  180 tablet  1  . felodipine (PLENDIL) 5 MG 24 hr tablet Take 1 tablet (5 mg total) by mouth daily.  90 tablet  6  . gabapentin (NEURONTIN) 600 MG tablet Take 600 mg by mouth 3 (three) times daily.        Marland Kitchen HYDROcodone-acetaminophen (VICODIN) 5-500 MG per tablet 1/2 tablet six times daily  90 tablet  3  . isosorbide mononitrate (IMDUR) 30 MG 24 hr tablet TAKE 1 TABLET BY MOUTH EVERY DAY  90 tablet  3  . LORazepam (ATIVAN) 0.5 MG tablet Take 1 tablet (0.5 mg total) by mouth 2 (two) times daily as needed.  60 tablet  1  . nitroGLYCERIN (NITROSTAT) 0.4 MG SL tablet Place 1 tablet (0.4 mg total) under the tongue every 5 (five) minutes as needed for chest pain.  100 tablet  3  . Omega-3 Fatty Acids (FISH  OIL) 1000 MG CAPS Take 1 capsule by mouth daily.        Marland Kitchen omeprazole (PRILOSEC) 20 MG capsule Take 20 mg by mouth 2 (two) times daily.       . promethazine (PHENERGAN) 25 MG tablet TAKE ONE TABLET BY MOUTH EVERY 6 HOURS FOR NAUSEA  20 tablet  0  . ramipril (ALTACE) 5 MG capsule TAKE ONE CAPSULE BY MOUTH EVERY DAY  90 capsule  3  . sertraline (ZOLOFT) 50 MG tablet TAKE 1/2 TABLET BY MOUTH AT BEDTIME  90 tablet  1  . simvastatin (ZOCOR) 80 MG tablet TAKE 1 TABLET BY MOUTH EVERY DAY  90 tablet  3  . tiotropium (SPIRIVA HANDIHALER) 18 MCG inhalation capsule Place 1 capsule (18 mcg total) into inhaler and inhale daily.  30 capsule  11  . acetaminophen (TYLENOL) 325 MG tablet Take 650 mg by mouth every 6 (six) hours as needed.          Allergies  Allergen Reactions  . Codeine Phosphate     REACTION: unspecified    Family History  Problem Relation Age of Onset  . Diabetes Father   . Heart attack Mother   . Diabetes    . Coronary artery disease    . Colon cancer Neg Hx  History   Social History  . Marital Status: Widowed    Spouse Name: N/A    Number of Children: 2  . Years of Education: N/A   Occupational History  .     Social History Main Topics  . Smoking status: Former Smoker -- 1.5 packs/day for 40 years    Types: Cigarettes    Quit date: 06/29/2000  . Smokeless tobacco: Never Used  . Alcohol Use: No  . Drug Use: No  . Sexually Active: Not on file   Other Topics Concern  . Not on file   Social History Narrative  . No narrative on file    ROS ALL NEGATIVE EXCEPT THOSE NOTED IN HPI  PE  General Appearance: well developed, well nourished in no acute distress HEENT: symmetrical face, PERRLA, good dentition  Neck: no JVD, thyromegaly, or adenopathy, trachea midline Chest: symmetric without deformity Cardiac: PMI non-displaced, RRR, normal S1, S2, no gallop or murmur Lung: clear to ausculation and percussion Vascular: Bilateral carotid bruits, left subclavian  bruit, dorsalis pedis 1+ on the right 2+ on the left posterior tibial 1+ on the left. Abdominal: nondistended, nontender, good bowel sounds, no HSM, no bruits Extremities: no cyanosis, clubbing or edema, no sign of DVT, no varicosities , no tissue breakdown Skin: normal color, no rashes Neuro: alert and oriented x 3, non-focal Pysch: normal affect  EKG Normal sinus rhythm with nonspecific ST segment changes. BMET    Component Value Date/Time   NA 146* 09/09/2011 0810   K 5.2* 09/09/2011 0810   CL 107 09/09/2011 0810   CO2 27 09/09/2011 0810   GLUCOSE 102* 09/09/2011 0810   GLUCOSE 91 10/31/2006 0948   BUN 17 09/09/2011 0810   CREATININE 1.2 09/09/2011 0810   CALCIUM 10.1 09/09/2011 0810   GFRNONAA 48* 08/24/2010 0259   GFRAA  Value: 58        The eGFR has been calculated using the MDRD equation. This calculation has not been validated in all clinical situations. eGFR's persistently <60 mL/min signify possible Chronic Kidney Disease.* 08/24/2010 0259    Lipid Panel     Component Value Date/Time   CHOL 141 09/09/2011 0810   TRIG 126.0 09/09/2011 0810   HDL 61.70 09/09/2011 0810   CHOLHDL 2 09/09/2011 0810   VLDL 25.2 09/09/2011 0810   LDLCALC 54 09/09/2011 0810    CBC    Component Value Date/Time   WBC 8.4 09/09/2011 0810   RBC 4.11 09/09/2011 0810   HGB 11.9* 09/09/2011 0810   HCT 36.4 09/09/2011 0810   PLT 219.0 09/09/2011 0810   MCV 88.4 09/09/2011 0810   MCH 27.8 08/24/2010 0259   MCHC 32.7 09/09/2011 0810   RDW 15.0* 09/09/2011 0810   LYMPHSABS 1.7 09/09/2011 0810   MONOABS 0.5 09/09/2011 0810   EOSABS 0.1 09/09/2011 0810   BASOSABS 0.0 09/09/2011 0810

## 2012-08-14 NOTE — Assessment & Plan Note (Signed)
Stable. Continue current secondary preventative therapy. No indication for carotid Dopplers with palpable pulses.

## 2012-08-14 NOTE — Patient Instructions (Addendum)
Your physician has requested that you have a carotid duplex in Nov 2013. This test is an ultrasound of the carotid arteries in your neck. It looks at blood flow through these arteries that supply the brain with blood. Allow one hour for this exam. There are no restrictions or special instructions.  Your physician wants you to follow-up in: 1 year.   You will receive a reminder letter in the mail two months in advance. If you don't receive a letter, please call our office to schedule the follow-up appointment.  Your physician recommends that you continue on your current medications as directed. Please refer to the Current Medication list given to you today.

## 2012-08-14 NOTE — Assessment & Plan Note (Signed)
Complex anatomy and previous surgery. Repeat carotid and subclavian Dopplers. Continue secondary preventative therapy.

## 2012-08-14 NOTE — Assessment & Plan Note (Signed)
Stable. Continue secondary preventative therapy. Return the office in one year. 

## 2012-08-28 ENCOUNTER — Other Ambulatory Visit: Payer: Self-pay | Admitting: Internal Medicine

## 2012-09-06 ENCOUNTER — Encounter: Payer: Self-pay | Admitting: Internal Medicine

## 2012-09-06 LAB — HM MAMMOGRAPHY

## 2012-09-07 ENCOUNTER — Other Ambulatory Visit: Payer: Self-pay | Admitting: Internal Medicine

## 2012-09-12 ENCOUNTER — Telehealth: Payer: Self-pay | Admitting: Internal Medicine

## 2012-09-12 NOTE — Telephone Encounter (Signed)
Caller: [name]/Patient; Patient Name: [name]; PCP: [name]); Best Callback Phone Number: [phone]. Call regarding fast before her physical.  Instructed pt if she was able to fast it would be a good idea in case lab work is ordered.  No symptoms or triage.

## 2012-09-13 ENCOUNTER — Telehealth: Payer: Self-pay | Admitting: Critical Care Medicine

## 2012-09-13 MED ORDER — MOXIFLOXACIN HCL 400 MG PO TABS: 400.0000 mg | ORAL_TABLET | Freq: Every day | ORAL | Status: DC

## 2012-09-13 MED ORDER — PREDNISONE 10 MG PO TABS: ORAL_TABLET | ORAL | Status: DC

## 2012-09-13 NOTE — Telephone Encounter (Signed)
Call in avelox 400mg  /d x 5 days Call in prednisone 10mg  Take 4 tablets daily for 5 days then stop  #20

## 2012-09-13 NOTE — Telephone Encounter (Signed)
Called spoke with patient, advised of PW's recs as stated below.  Rx sent to [facility] per pt's request.  Pt to call if her symptoms do not improve or worsen.  Will sign off.

## 2012-09-13 NOTE — Telephone Encounter (Signed)
Pt reports having a bad episode of acid reflux 3 nights ago and since then has had increased chest congestion, sob, prod cough (dark brown), wheezing.  PT is taking prilosec bid , spiriva daily daily and has an old atrovent inhaler that she uses occasionally.  Pt wants to know what she can do to dry this up.  Please advise. Allergies  Allergen Reactions  . Codeine Phosphate     REACTION: unspecified

## 2012-09-15 ENCOUNTER — Telehealth: Payer: Self-pay | Admitting: Critical Care Medicine

## 2012-09-15 DIAGNOSIS — J449 Chronic obstructive pulmonary disease, unspecified: Secondary | ICD-10-CM

## 2012-09-15 MED ORDER — TIOTROPIUM BROMIDE MONOHYDRATE 18 MCG IN CAPS: 18.0000 ug | ORAL_CAPSULE | Freq: Every day | RESPIRATORY_TRACT | Status: DC

## 2012-09-15 NOTE — Telephone Encounter (Signed)
2 samples left up front for the pt. Pt is aware.

## 2012-09-18 ENCOUNTER — Ambulatory Visit (INDEPENDENT_AMBULATORY_CARE_PROVIDER_SITE_OTHER): Payer: Medicare Other | Admitting: Internal Medicine

## 2012-09-18 ENCOUNTER — Encounter: Payer: Self-pay | Admitting: Internal Medicine

## 2012-09-18 VITALS — BP 130/70 | HR 86 | Temp 97.9°F | Resp 18 | Ht 62.5 in | Wt 151.0 lb

## 2012-09-18 DIAGNOSIS — D649 Anemia, unspecified: Secondary | ICD-10-CM

## 2012-09-18 DIAGNOSIS — I10 Essential (primary) hypertension: Secondary | ICD-10-CM

## 2012-09-18 DIAGNOSIS — Z0000 Encounter for general adult medical examination without abnormal findings: Secondary | ICD-10-CM

## 2012-09-18 DIAGNOSIS — I679 Cerebrovascular disease, unspecified: Secondary | ICD-10-CM

## 2012-09-18 DIAGNOSIS — E785 Hyperlipidemia, unspecified: Secondary | ICD-10-CM

## 2012-09-18 DIAGNOSIS — I2510 Atherosclerotic heart disease of native coronary artery without angina pectoris: Secondary | ICD-10-CM

## 2012-09-18 DIAGNOSIS — J449 Chronic obstructive pulmonary disease, unspecified: Secondary | ICD-10-CM

## 2012-09-18 DIAGNOSIS — M1990 Unspecified osteoarthritis, unspecified site: Secondary | ICD-10-CM

## 2012-09-18 LAB — CBC WITH DIFFERENTIAL/PLATELET
Basophils Absolute: 0 10*3/uL (ref 0.0–0.1)
Eosinophils Absolute: 0.1 10*3/uL (ref 0.0–0.7)
Lymphocytes Relative: 31.7 % (ref 12.0–46.0)
MCHC: 32.4 g/dL (ref 30.0–36.0)
Monocytes Relative: 6.3 % (ref 3.0–12.0)
Neutrophils Relative %: 61 % (ref 43.0–77.0)
Platelets: 302 10*3/uL (ref 150.0–400.0)
RDW: 14.8 % — ABNORMAL HIGH (ref 11.5–14.6)

## 2012-09-18 LAB — LIPID PANEL
Total CHOL/HDL Ratio: 2
Triglycerides: 157 mg/dL — ABNORMAL HIGH (ref 0.0–149.0)

## 2012-09-18 LAB — COMPREHENSIVE METABOLIC PANEL
ALT: 16 U/L (ref 0–35)
AST: 16 U/L (ref 0–37)
Albumin: 3.6 g/dL (ref 3.5–5.2)
CO2: 29 mEq/L (ref 19–32)
Calcium: 9.7 mg/dL (ref 8.4–10.5)
Chloride: 100 mEq/L (ref 96–112)
Creatinine, Ser: 1.1 mg/dL (ref 0.4–1.2)
GFR: 50.46 mL/min — ABNORMAL LOW (ref >60.00)
Potassium: 4.1 mEq/L (ref 3.5–5.1)
Sodium: 140 mEq/L (ref 135–145)
Total Protein: 7.5 g/dL (ref 6.0–8.3)

## 2012-09-18 MED ORDER — ISOSORBIDE MONONITRATE ER 30 MG PO TB24: 30.0000 mg | ORAL_TABLET | Freq: Every day | ORAL | Status: DC

## 2012-09-18 MED ORDER — TIOTROPIUM BROMIDE MONOHYDRATE 18 MCG IN CAPS: 18.0000 ug | ORAL_CAPSULE | Freq: Every day | RESPIRATORY_TRACT | Status: DC

## 2012-09-18 MED ORDER — CHLORTHALIDONE 25 MG PO TABS: 25.0000 mg | ORAL_TABLET | Freq: Every day | ORAL | Status: DC

## 2012-09-18 MED ORDER — LORAZEPAM 0.5 MG PO TABS: 0.5000 mg | ORAL_TABLET | Freq: Two times a day (BID) | ORAL | Status: DC | PRN

## 2012-09-18 MED ORDER — SERTRALINE HCL 50 MG PO TABS: 50.0000 mg | ORAL_TABLET | Freq: Every day | ORAL | Status: DC

## 2012-09-18 MED ORDER — SIMVASTATIN 80 MG PO TABS: 80.0000 mg | ORAL_TABLET | Freq: Every day | ORAL | Status: DC

## 2012-09-18 MED ORDER — FELODIPINE ER 5 MG PO TB24: 5.0000 mg | ORAL_TABLET | Freq: Every day | ORAL | Status: DC

## 2012-09-18 MED ORDER — CILOSTAZOL 100 MG PO TABS: 100.0000 mg | ORAL_TABLET | Freq: Two times a day (BID) | ORAL | Status: DC

## 2012-09-18 MED ORDER — GABAPENTIN 600 MG PO TABS: 600.0000 mg | ORAL_TABLET | Freq: Three times a day (TID) | ORAL | Status: DC

## 2012-09-18 MED ORDER — RAMIPRIL 5 MG PO CAPS: 5.0000 mg | ORAL_CAPSULE | Freq: Every day | ORAL | Status: DC

## 2012-09-18 MED ORDER — OMEPRAZOLE 20 MG PO CPDR: 20.0000 mg | DELAYED_RELEASE_CAPSULE | Freq: Two times a day (BID) | ORAL | Status: DC

## 2012-09-18 NOTE — Patient Instructions (Signed)
Limit your sodium (Salt) intake    It is important that you exercise regularly, at least 20 minutes 3 to 4 times per week.  If you develop chest pain or shortness of breath seek  medical attention.  Return in 6 months for follow-up  

## 2012-09-18 NOTE — Progress Notes (Signed)
Patient ID: [name], female   DOB: July 12, 1941, 71 y.o.   MRN: [id number]  Subjective:    Patient ID: [name], female    DOB: 1941-01-01, 71 y.o.   MRN: [id number]  HPI   71 year old patient who is seen today for a wellness exam. She is followed by cardiology for coronary artery disease and also has a history of extensive peripheral vascular disease. She is followed also by pulmonary for  Mild COPD and a prior history of a lung abscess. She did have followup colonoscopy  2011. She continues to work until laid off in August and does reasonably well. She does have arthritis. Medical problems include dyslipidemia treated hypertension gastroesophageal reflux disease and a history of anxiety disorder.  Past History:  Past Medical History:  CORONARY ARTERY DISEASE (ICD-414.00)  PERIPHERAL VASCULAR DISEASE (ICD-443.9)  HYPERTENSION (ICD-401.9)  HYPERLIPIDEMIA (ICD-272.4)  DEPRESSIVE DISORDER (ICD-311)  HEALTH SCREENING (ICD-V70.0)  OSTEOARTHRITIS (ICD-715.90)  COLONIC POLYPS, HX OF (ICD-V12.72)  lumbar spinal stenosis   Past Surgical History:  L subclavian bypass September 2005  Tubal ligation  Coronary artery bypass graft July 2001  Femoral popliteal bypass R.-June 2000  status post right common iliac PTA with stent placement, September 2001  status post redo right femoral until bypass February 2002; complicated by early thrombosis, requiring thrombectomy  aorto bifemoral bypass November 2004  colonoscopy October 2006  2011  breat bx (2) 2010  Dexa 12/09  carotid duplex- 6-09  Lumbar MRI 8-10  ABI 4-10   Family History:  Reviewed history from 10/16/2007 and no changes required.  father died age 1. History diabetes, cerebrovascular disease  mother died at age 52, status post hip surgery of an MI  5 brothers, one sister- one brother deceased -DM, CAD, SDAT  Positive diabetes, and coronary artery disease   Social History:  Reviewed history from 02/12/2008 and no  changes required.  Former Smoker  Divorced  1. Risk factors, based on past  M,S,F history- patient has known cardiovascular disease and peripheral vascular disease status post bypass. Cardiovascular risk factors include hypertension dyslipidemia and a history of tobacco use  2.  Physical activities: No activity restrictions  3.  Depression/mood: No history depression or mood disorder  4.  Hearing: No deficits  5.  ADL's: Independent in all aspects of daily living  6.  Fall risk: Low  7.  Home safety: No problems identified 8.  Height weight, and visual acuity; height and weight stable no change in visual acuity  9.  Counseling: Regular exercise heart healthy diet encouraged  10. Lab orders based on risk factors: Laboratory update including lipid panel be reviewed  11. Referral : Followup cardiology pulmonary and GI  12. Care plan: Continue present regimen  13. Cognitive assessment: Alert and oriented with normal affect. No cognitive dysfunction     Past Medical History  Diagnosis Date  . ANEMIA 08/28/2010  . 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 COLONIC POLYPS, HX OF 06/15/2007  . 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 CORONARY ARTERY DISEASE 06/15/2007  . DEPRESSIVE DISORDER 06/06/2008  . GERD 12/19/2009  . HYPERLIPIDEMIA 06/15/2007  . HYPERTENSION 06/15/2007  . HYPOTENSION 08/11/2010  . OSTEOARTHRITIS 06/15/2007  . PEDAL EDEMA 06/06/2009  . PERIPHERAL VASCULAR DISEASE 06/15/2007  . Lumbar stenosis   . Lung abscess 2011  . CHF (congestive heart failure)   . Hx of colonoscopy    Past Surgical History  Procedure Date  . L subclavian bypass 07/2004  . Tubal ligation   .  Coronary artery bypass graft 05/2000  . Femoral popliteal bypass-r 04/1999  . Right common iliac pta with stent placement 07/2000  . Right leg blockage 2003  . Aorta bifemoral bypass graft 2004  . Left cea 2005  . Lung cyst biopsy 2011    reports that she quit smoking about 12 years ago. Her smoking use included  Cigarettes. She has a 60 pack-year smoking history. She has never used smokeless tobacco. She reports that she does not drink alcohol or use illicit drugs. family history includes Coronary artery disease in an unspecified family member; Diabetes in her father and unspecified family member; and Heart attack in her mother.  There is no history of Colon cancer. Allergies  Allergen Reactions  . Codeine Phosphate     REACTION: unspecified      Review of Systems  Constitutional: Negative for fever, appetite change, fatigue and unexpected weight change.  HENT: Negative for hearing loss, ear pain, nosebleeds, congestion, sore throat, mouth sores, trouble swallowing, neck stiffness, dental problem, voice change, sinus pressure and tinnitus.   Eyes: Negative for photophobia, pain, redness and visual disturbance.  Respiratory: Negative for cough, chest tightness and shortness of breath.   Cardiovascular: Negative for chest pain, palpitations and leg swelling.  Gastrointestinal: Negative for nausea, vomiting, abdominal pain, diarrhea, constipation, blood in stool, abdominal distention and rectal pain.  Genitourinary: Negative for dysuria, urgency, frequency, hematuria, flank pain, vaginal bleeding, vaginal discharge, difficulty urinating, genital sores, vaginal pain, menstrual problem and pelvic pain.  Musculoskeletal: Positive for back pain and arthralgias.  Skin: Negative for rash.  Neurological: Negative for dizziness, syncope, speech difficulty, weakness, light-headedness, numbness and headaches.  Hematological: Negative for adenopathy. Does not bruise/bleed easily.  Psychiatric/Behavioral: Negative for suicidal ideas, behavioral problems, self-injury, dysphoric mood and agitation. The patient is not nervous/anxious.        Objective:   Physical Exam  Constitutional: She is oriented to person, place, and time. She appears well-developed and well-nourished.       Blood pressure 140/60 in both  arms  HENT:  Head: Normocephalic and atraumatic.  Right Ear: External ear normal.  Left Ear: External ear normal.  Mouth/Throat: Oropharynx is clear and moist.  Eyes: Conjunctivae normal and EOM are normal.  Neck: Normal range of motion. Neck supple. No JVD present. No thyromegaly present.       Bilateral carotid and supraclavicular bruits  Cardiovascular: Normal rate, regular rhythm and normal heart sounds.   No murmur heard.      The left dorsalis pedis pulse was intact the right dorsalis pedis pulse was diminished. Posterior tibial pulses were nonpalpable  Bilateral carotid and supraclavicular bruits were noted as well as bifemoral bruits  Status post sternotomy scar  Pulmonary/Chest: Effort normal and breath sounds normal. She has no wheezes. She has no rales.  Abdominal: Soft. Bowel sounds are normal. She exhibits no distension and no mass. There is no tenderness. There is no rebound and no guarding.  Musculoskeletal: Normal range of motion. She exhibits no edema and no tenderness.  Neurological: She is alert and oriented to person, place, and time. She has normal reflexes. No cranial nerve deficit. She exhibits normal muscle tone. Coordination normal.  Skin: Skin is warm and dry. No rash noted.  Psychiatric: She has a normal mood and affect. Her behavior is normal.          Assessment & Plan:   Preventive health examination Coronary artery disease status post CABG in 2001 Peripheral vascular disease Hypertension Dyslipidemia.  LDL is at goal less than 70  We'll continue present regimen. Followup cardiology and pulmonary as scheduled. We'll see him in 6 months or as needed

## 2012-09-20 ENCOUNTER — Other Ambulatory Visit: Payer: Self-pay | Admitting: Internal Medicine

## 2012-09-29 ENCOUNTER — Encounter: Payer: Self-pay | Admitting: Internal Medicine

## 2012-09-29 ENCOUNTER — Ambulatory Visit (INDEPENDENT_AMBULATORY_CARE_PROVIDER_SITE_OTHER): Payer: Medicare Other | Admitting: Internal Medicine

## 2012-09-29 VITALS — BP 124/80 | Temp 98.1°F | Wt 152.0 lb

## 2012-09-29 DIAGNOSIS — B370 Candidal stomatitis: Secondary | ICD-10-CM

## 2012-09-29 DIAGNOSIS — J449 Chronic obstructive pulmonary disease, unspecified: Secondary | ICD-10-CM

## 2012-09-29 DIAGNOSIS — I2510 Atherosclerotic heart disease of native coronary artery without angina pectoris: Secondary | ICD-10-CM

## 2012-09-29 DIAGNOSIS — J4489 Other specified chronic obstructive pulmonary disease: Secondary | ICD-10-CM

## 2012-09-29 DIAGNOSIS — I10 Essential (primary) hypertension: Secondary | ICD-10-CM

## 2012-09-29 MED ORDER — FLUCONAZOLE 100 MG PO TABS: ORAL_TABLET | ORAL | Status: DC

## 2012-09-29 MED ORDER — DIPHENHYD-HYDROCORT-NYSTATIN MT SUSP: 5.0000 mL | Freq: Four times a day (QID) | OROMUCOSAL | Status: DC

## 2012-09-29 NOTE — Patient Instructions (Signed)
Do not take simvastatin if you require treatment with fluconazole  Try nystatin mouth wash initially 4 times daily  Return in 3 months for follow-up

## 2012-09-29 NOTE — Progress Notes (Signed)
Subjective:    Patient ID: [name], female    DOB: 1941-02-06, 71 y.o.   MRN: [id number]  HPI  71 year old patient has a history of mild COPD. She was evaluated and treated by pulmonary medicine last month for exacerbation of COPD. She was treated with a brief round of antibiotic therapy and a prednisone regimen. For the past several days she has noted the soreness in the mouth associated with white exudate. She was concerned about thrush  Past Medical History  Diagnosis Date  . ANEMIA 08/28/2010  . 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 COLONIC POLYPS, HX OF 06/15/2007  . 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 CORONARY ARTERY DISEASE 06/15/2007  . DEPRESSIVE DISORDER 06/06/2008  . GERD 12/19/2009  . HYPERLIPIDEMIA 06/15/2007  . HYPERTENSION 06/15/2007  . HYPOTENSION 08/11/2010  . OSTEOARTHRITIS 06/15/2007  . PEDAL EDEMA 06/06/2009  . PERIPHERAL VASCULAR DISEASE 06/15/2007  . Lumbar stenosis   . Lung abscess 2011  . CHF (congestive heart failure)   . Hx of colonoscopy     History   Social History  . Marital Status: Widowed    Spouse Name: N/A    Number of Children: 2  . Years of Education: N/A   Occupational History  .     Social History Main Topics  . Smoking status: Former Smoker -- 1.5 packs/day for 40 years    Types: Cigarettes    Quit date: 06/29/2000  . Smokeless tobacco: Never Used  . Alcohol Use: No  . Drug Use: No  . Sexually Active: Not on file   Other Topics Concern  . Not on file   Social History Narrative  . No narrative on file    Past Surgical History  Procedure Date  . L subclavian bypass 07/2004  . Tubal ligation   . Coronary artery bypass graft 05/2000  . Femoral popliteal bypass-r 04/1999  . Right common iliac pta with stent placement 07/2000  . Right leg blockage 2003  . Aorta bifemoral bypass graft 2004  . Left cea 2005  . Lung cyst biopsy 2011    Family History  Problem Relation Age of Onset  . Diabetes Father   . Heart attack Mother   . Diabetes     . Coronary artery disease    . Colon cancer Neg Hx     Allergies  Allergen Reactions  . Codeine Phosphate     REACTION: unspecified    Current Outpatient Prescriptions on File Prior to Visit  Medication Sig Dispense Refill  . acetaminophen (TYLENOL) 325 MG tablet Take 650 mg by mouth every 6 (six) hours as needed.        . Chlorpheniramine-Acetaminophen (CORICIDIN HBP COLD/FLU PO) Take 1 tablet by mouth daily as needed.      . chlorthalidone (HYGROTON) 25 MG tablet Take 1 tablet (25 mg total) by mouth daily.  90 tablet  3  . cilostazol (PLETAL) 100 MG tablet Take 1 tablet (100 mg total) by mouth 2 (two) times daily.  180 tablet  5  . felodipine (PLENDIL) 5 MG 24 hr tablet Take 1 tablet (5 mg total) by mouth daily.  90 tablet  6  . gabapentin (NEURONTIN) 600 MG tablet Take 1 tablet (600 mg total) by mouth 3 (three) times daily.  180 tablet  4  . HYDROcodone-acetaminophen (VICODIN) 5-500 MG per tablet 1/2 tablet six times daily  90 tablet  3  . isosorbide mononitrate (IMDUR) 30 MG 24 hr tablet Take 1 tablet (30 mg  total) by mouth daily.  90 tablet  3  . LORazepam (ATIVAN) 0.5 MG tablet Take 1 tablet (0.5 mg total) by mouth 2 (two) times daily as needed.  60 tablet  1  . nitroGLYCERIN (NITROSTAT) 0.4 MG SL tablet Place 0.4 mg under the tongue every 5 (five) minutes as needed.      . Omega-3 Fatty Acids (FISH OIL) 1000 MG CAPS Take 1 capsule by mouth daily.        Marland Kitchen omeprazole (PRILOSEC) 20 MG capsule Take 1 capsule (20 mg total) by mouth 2 (two) times daily.  90 capsule  5  . promethazine (PHENERGAN) 25 MG tablet TAKE ONE TABLET BY MOUTH EVERY 6 HOURS FOR NAUSEA  20 tablet  0  . ramipril (ALTACE) 5 MG capsule Take 1 capsule (5 mg total) by mouth daily.  90 capsule  5  . sertraline (ZOLOFT) 50 MG tablet Take 1 tablet (50 mg total) by mouth daily.  90 tablet  5  . simvastatin (ZOCOR) 80 MG tablet Take 1 tablet (80 mg total) by mouth at bedtime.  90 tablet  3  . tiotropium (SPIRIVA  HANDIHALER) 18 MCG inhalation capsule Place 1 capsule (18 mcg total) into inhaler and inhale daily.  20 capsule  0    BP 124/80  Temp 98.1 F (36.7 C) (Oral)  Wt 152 lb (68.947 kg)       Review of Systems  Constitutional: Negative.   HENT: Positive for sore throat. Negative for hearing loss, congestion, rhinorrhea, dental problem, sinus pressure and tinnitus.   Eyes: Negative for pain, discharge and visual disturbance.  Respiratory: Negative for cough and shortness of breath.   Cardiovascular: Negative for chest pain, palpitations and leg swelling.  Gastrointestinal: Negative for nausea, vomiting, abdominal pain, diarrhea, constipation, blood in stool and abdominal distention.  Genitourinary: Negative for dysuria, urgency, frequency, hematuria, flank pain, vaginal bleeding, vaginal discharge, difficulty urinating, vaginal pain and pelvic pain.  Musculoskeletal: Negative for joint swelling, arthralgias and gait problem.  Skin: Negative for rash.  Neurological: Negative for dizziness, syncope, speech difficulty, weakness, numbness and headaches.  Hematological: Negative for adenopathy.  Psychiatric/Behavioral: Negative for behavioral problems, dysphoric mood and agitation. The patient is not nervous/anxious.        Objective:   Physical Exam  Constitutional: She appears well-developed and well-nourished. No distress.  HENT:       Scattered white patchy lesions of the oral pharynx consistent with the oral pharyngeal candidiasis  Pulmonary/Chest: Effort normal and breath sounds normal. No respiratory distress. She has no wheezes.          Assessment & Plan:   Thrush we'll give a trial of nystatin if this is not effective we'll treat with fluconazole (If  fluconazole used will hold statin therapy during this interval) COPD stable CAD stable

## 2012-10-03 ENCOUNTER — Encounter (INDEPENDENT_AMBULATORY_CARE_PROVIDER_SITE_OTHER): Payer: Medicare Other

## 2012-10-03 DIAGNOSIS — I6529 Occlusion and stenosis of unspecified carotid artery: Secondary | ICD-10-CM

## 2012-10-10 ENCOUNTER — Telehealth: Payer: Self-pay | Admitting: Internal Medicine

## 2012-10-10 NOTE — Telephone Encounter (Signed)
Caller: [name]/Patient; Patient Name: [name]; PCP: [name] [facility]); Best Callback Phone Number: [phone].  Patient calling about medication refill.  States seen in office 09/29/12 and is being treated for thrush currently; has one more dose of her medication.  States her tongue continues to be pale whitish and "it tastes bad."  Would like to know if Dr. Amador Cunas would consider extending the medication course, and call in a liquid Rx.  Per Epic, active Rx in system for diphenhyd/hydrocort/nystatin susp, 5ml to mouth or throat QID, disp 8oz, RF x 1; [facility] tells patient they never received that Rx.  Per Epic, Rx [facility] [phone].

## 2012-10-30 ENCOUNTER — Telehealth: Payer: Self-pay | Admitting: Family Medicine

## 2012-10-30 ENCOUNTER — Telehealth: Payer: Self-pay | Admitting: Critical Care Medicine

## 2012-10-30 NOTE — Telephone Encounter (Signed)
Pt wondering if we have samples available of Spiriva (tiotropium (SPIRIVA HANDIHALER) 18 MCG inhalation capsule) inhaler. Please advise and call pt to pick up if we do. Thanks.

## 2012-10-30 NOTE — Telephone Encounter (Signed)
Pt aware that she will need an appointment soon. Samples are going to be given to her to last until appointment.

## 2012-10-31 NOTE — Telephone Encounter (Signed)
Left message on voicemail. Sample of Spiriva is ready for pick up at front desk.

## 2012-10-31 NOTE — Telephone Encounter (Signed)
Left message on voicemail. Sample of Spiriva is up front for pick up.

## 2012-11-07 ENCOUNTER — Ambulatory Visit (INDEPENDENT_AMBULATORY_CARE_PROVIDER_SITE_OTHER): Payer: Medicare Other | Admitting: Critical Care Medicine

## 2012-11-07 ENCOUNTER — Ambulatory Visit (INDEPENDENT_AMBULATORY_CARE_PROVIDER_SITE_OTHER)
Admission: RE | Admit: 2012-11-07 | Discharge: 2012-11-07 | Disposition: A | Discharge status: Home / Self Care | Payer: Medicare Other | Source: Ambulatory Visit | Attending: Critical Care Medicine | Admitting: Critical Care Medicine

## 2012-11-07 ENCOUNTER — Encounter: Payer: Self-pay | Admitting: Critical Care Medicine

## 2012-11-07 VITALS — BP 112/70 | HR 98 | Temp 97.9°F | Ht 62.5 in | Wt 157.6 lb

## 2012-11-07 DIAGNOSIS — J189 Pneumonia, unspecified organism: Secondary | ICD-10-CM

## 2012-11-07 DIAGNOSIS — J449 Chronic obstructive pulmonary disease, unspecified: Secondary | ICD-10-CM

## 2012-11-07 DIAGNOSIS — K219 Gastro-esophageal reflux disease without esophagitis: Secondary | ICD-10-CM

## 2012-11-07 MED ORDER — LOSARTAN POTASSIUM 25 MG PO TABS: 25.0000 mg | ORAL_TABLET | Freq: Every day | ORAL | Status: DC

## 2012-11-07 NOTE — Assessment & Plan Note (Signed)
Gastroesophageal reflux disease driving lower airway inflammation Plan Maintain proton pump inhibitor Discontinue fish oil Change ACE inhibitor to an ARB to reduce cough cycle

## 2012-11-07 NOTE — Assessment & Plan Note (Signed)
Bilateral left and right midlung opacities now completely resolved on today's film 11/07/2012 No additional antibiotics indicated

## 2012-11-07 NOTE — Progress Notes (Signed)
Subjective:    Patient ID: [name], female    DOB: [birth year], 71 y.o.   MRN: [id number]  HPI  71 y.o.  WF with RML lung abscess no other risk factors and neg FOB for endobronchial lesion 9/11. Mild COPD ex smoker 60pack years. Normal FeV1, Fef25-75 39% predicted    06/26/2012 Notes two day hx of low grade fever, cough increased prod dark green, more dyspneic.  Notes sl soreness in chest area. >>Dx CAP , xray showed Left midlung and left base pneumonia. tx w/ Avelox x 7 d   8/5 1 week follow up for PNA  Returns for 1 week follow up .  Seen last week for acute OV w/ productive cough.  CXR showed Left midlung and left base pneumonia. Tx w/ Avelox x 1 week. -1 day left. No diarrhea.  She is feeling better. Cough is better. Mucus clear.  Xray today shows Improving left lung infiltrates. A nodular component remains in the left perihilar region. No hemoptysis or edema. No fever  Lost her job -worked fulltime at [employer] .   08/09/2012 Pt lost her job.  Pt is feeling better.  Dyspnea is better.  No real cough  Pt denies any significant sore throat, nasal congestion or excess secretions, fever, chills, sweats, unintended weight loss, pleurtic or exertional chest pain, orthopnea PND, or leg swelling Pt denies any increase in rescue therapy over baseline, denies waking up needing it or having any early am or nocturnal exacerbations of coughing/wheezing/or dyspnea. Pt also denies any obvious fluctuation in symptoms with  weather or environmental change or other alleviating or aggravating factors  11/07/2012 Noting more GERD and cough is prod white mucus.  Dyspnea is at baseline. Pt notes some wheezing. Pt denies any significant sore throat, nasal congestion or excess secretions, fever, chills, sweats, unintended weight loss, pleurtic or exertional chest pain, orthopnea PND, or leg swelling Pt denies any increase in rescue therapy over baseline, denies waking up needing it or having any  early am or nocturnal exacerbations of coughing/wheezing/or dyspnea. Pt also denies any obvious fluctuation in symptoms with  weather or environmental change or other alleviating or aggravating factors     Past Medical History  Diagnosis Date  . ANEMIA 08/28/2010  . 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 COLONIC POLYPS, HX OF 06/15/2007  . 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 CORONARY ARTERY DISEASE 06/15/2007  . DEPRESSIVE DISORDER 06/06/2008  . GERD 12/19/2009  . HYPERLIPIDEMIA 06/15/2007  . HYPERTENSION 06/15/2007  . HYPOTENSION 08/11/2010  . OSTEOARTHRITIS 06/15/2007  . PEDAL EDEMA 06/06/2009  . PERIPHERAL VASCULAR DISEASE 06/15/2007  . Lumbar stenosis   . Lung abscess 2011  . CHF (congestive heart failure)   . Hx of colonoscopy      Family History  Problem Relation Age of Onset  . Diabetes Father   . Heart attack Mother   . Diabetes    . Coronary artery disease    . Colon cancer Neg Hx      History   Social History  . Marital Status: Widowed    Spouse Name: N/A    Number of Children: 2  . Years of Education: N/A   Occupational History  .     Social History Main Topics  . Smoking status: Former Smoker -- 1.5 packs/day for 40 years    Types: Cigarettes    Quit date: 06/29/2000  . Smokeless tobacco: Never Used  . Alcohol Use: No  . Drug Use: No  .  Sexually Active: Not on file   Other Topics Concern  . Not on file   Social History Narrative  . No narrative on file     Allergies  Allergen Reactions  . Codeine Phosphate     REACTION: unspecified     Outpatient Prescriptions Prior to Visit  Medication Sig Dispense Refill  . acetaminophen (TYLENOL) 325 MG tablet Take 650 mg by mouth every 6 (six) hours as needed.        . Chlorpheniramine-Acetaminophen (CORICIDIN HBP COLD/FLU PO) Take 1 tablet by mouth daily as needed.      . chlorthalidone (HYGROTON) 25 MG tablet Take 1 tablet (25 mg total) by mouth daily.  90 tablet  3  . cilostazol (PLETAL) 100 MG tablet Take 1 tablet  (100 mg total) by mouth 2 (two) times daily.  180 tablet  5  . felodipine (PLENDIL) 5 MG 24 hr tablet Take 1 tablet (5 mg total) by mouth daily.  90 tablet  6  . gabapentin (NEURONTIN) 600 MG tablet Take 1 tablet (600 mg total) by mouth 3 (three) times daily.  180 tablet  4  . HYDROcodone-acetaminophen (VICODIN) 5-500 MG per tablet 1/2 tablet six times daily  90 tablet  3  . isosorbide mononitrate (IMDUR) 30 MG 24 hr tablet Take 1 tablet (30 mg total) by mouth daily.  90 tablet  3  . LORazepam (ATIVAN) 0.5 MG tablet Take 1 tablet (0.5 mg total) by mouth 2 (two) times daily as needed.  60 tablet  1  . nitroGLYCERIN (NITROSTAT) 0.4 MG SL tablet Place 0.4 mg under the tongue every 5 (five) minutes as needed.      Marland Kitchen omeprazole (PRILOSEC) 20 MG capsule Take 1 capsule (20 mg total) by mouth 2 (two) times daily.  90 capsule  5  . promethazine (PHENERGAN) 25 MG tablet TAKE ONE TABLET BY MOUTH EVERY 6 HOURS FOR NAUSEA  20 tablet  0  . sertraline (ZOLOFT) 50 MG tablet Take 1 tablet (50 mg total) by mouth daily.  90 tablet  5  . simvastatin (ZOCOR) 80 MG tablet Take 1 tablet (80 mg total) by mouth at bedtime.  90 tablet  3  . tiotropium (SPIRIVA HANDIHALER) 18 MCG inhalation capsule Place 1 capsule (18 mcg total) into inhaler and inhale daily.  20 capsule  0  . [DISCONTINUED] Omega-3 Fatty Acids (FISH OIL) 1000 MG CAPS Take 1 capsule by mouth daily.        . [DISCONTINUED] ramipril (ALTACE) 5 MG capsule Take 1 capsule (5 mg total) by mouth daily.  90 capsule  5  . Diphenhyd-Hydrocort-Nystatin SUSP Use as directed 5 mLs in the mouth or throat 4 (four) times daily.  8 oz  1  . [DISCONTINUED] fluconazole (DIFLUCAN) 100 MG tablet 2 initially then 1 daily  15 tablet  0  Last reviewed on 11/07/2012  9:43 AM by Storm Frisk, MD    Review of Systems  Constitutional:   No  weight loss, night sweats,  Fevers, chills,  +fatigue, lassitude. HEENT:   No headaches,  Difficulty swallowing,  Tooth/dental problems,   Sore throat,                No sneezing, itching, ear ache, nasal congestion, post nasal drip,   CV:  No chest pain,  Orthopnea, PND, swelling in lower extremities, anasarca, dizziness, palpitations  GI  Notes  heartburn, notes indigestion, abdominal pain, nausea, vomiting, diarrhea, change in bowel habits, loss of appetite  Resp:  No coughing up of blood.  Notes change in color of mucus.  No wheezing.  No chest wall deformity  Skin: no rash or lesions.  GU: no dysuria, change in color of urine, no urgency or frequency.  No flank pain.  MS:  No joint pain or swelling.  No decreased range of motion.     Psych:  No change in mood or affect. No depression or anxiety.  No memory loss.     Objective:   Physical Exam  Filed Vitals:   11/07/12 0926  BP: 112/70  Pulse: 98  Temp: 97.9 F (36.6 C)  TempSrc: Oral  Height: 5' 2.5" (1.588 m)  Weight: 157 lb 9.6 oz (71.487 kg)  SpO2: 94%    Gen: Pleasant,  in no distress,  normal affect  ENT: No lesions,  mouth clear,  oropharynx clear, no postnasal drip  Neck: No JVD, no TMG, no carotid bruits  Lungs: No use of accessory muscles, no dullness to percussion, Coarse BS   Cardiovascular: RRR, heart sounds normal, no murmur or gallops, no peripheral edema  Abdomen: soft and NT, no HSM,  BS normal  Musculoskeletal: No deformities, no cyanosis or clubbing  Neuro: alert, non focal  Skin: Warm, no lesions or rashes     Dg Chest 2 View  11/07/2012  *RADIOLOGY REPORT*  Clinical Data: Pneumonia  CHEST - 2 VIEW  Comparison: 08/09/2012  Findings: Patchy bilateral perihilar opacities have resolved. Increased AP diameter the chest and chronic basilar predominately linear lung markings are stable.  No pneumothorax.  No pleural effusion.  Normal heart size.  Stable thoracic spine.  IMPRESSION: Resolved bilateral pneumonia.  Chronic changes are superimposed.   Original Report Authenticated By: Jolaine Click, M.D.     Assessment & Plan:    COPD, MILD Chronic obstructive lung disease gold stage B. stable at this time Associated reflux disease Plan Follow reflux diet Use flaxseed crushed on salads instead of fish oil Stop fish oil Start losartan 25mg  /d  Stop ramipril Stay on Spiriva Return 4 months   GERD Gastroesophageal reflux disease driving lower airway inflammation Plan Maintain proton pump inhibitor Discontinue fish oil Change ACE inhibitor to an ARB to reduce cough cycle  Pneumonia Bilateral left and right midlung opacities now completely resolved on today's film 11/07/2012 No additional antibiotics indicated    Updated Medication List Outpatient Encounter Prescriptions as of 11/07/2012  Medication Sig Dispense Refill  . acetaminophen (TYLENOL) 325 MG tablet Take 650 mg by mouth every 6 (six) hours as needed.        . Chlorpheniramine-Acetaminophen (CORICIDIN HBP COLD/FLU PO) Take 1 tablet by mouth daily as needed.      . chlorthalidone (HYGROTON) 25 MG tablet Take 1 tablet (25 mg total) by mouth daily.  90 tablet  3  . cilostazol (PLETAL) 100 MG tablet Take 1 tablet (100 mg total) by mouth 2 (two) times daily.  180 tablet  5  . felodipine (PLENDIL) 5 MG 24 hr tablet Take 1 tablet (5 mg total) by mouth daily.  90 tablet  6  . gabapentin (NEURONTIN) 600 MG tablet Take 1 tablet (600 mg total) by mouth 3 (three) times daily.  180 tablet  4  . HYDROcodone-acetaminophen (VICODIN) 5-500 MG per tablet 1/2 tablet six times daily  90 tablet  3  . isosorbide mononitrate (IMDUR) 30 MG 24 hr tablet Take 1 tablet (30 mg total) by mouth daily.  90 tablet  3  . LORazepam (ATIVAN) 0.5 MG tablet  Take 1 tablet (0.5 mg total) by mouth 2 (two) times daily as needed.  60 tablet  1  . nitroGLYCERIN (NITROSTAT) 0.4 MG SL tablet Place 0.4 mg under the tongue every 5 (five) minutes as needed.      Marland Kitchen omeprazole (PRILOSEC) 20 MG capsule Take 1 capsule (20 mg total) by mouth 2 (two) times daily.  90 capsule  5  . promethazine  (PHENERGAN) 25 MG tablet TAKE ONE TABLET BY MOUTH EVERY 6 HOURS FOR NAUSEA  20 tablet  0  . sertraline (ZOLOFT) 50 MG tablet Take 1 tablet (50 mg total) by mouth daily.  90 tablet  5  . simvastatin (ZOCOR) 80 MG tablet Take 1 tablet (80 mg total) by mouth at bedtime.  90 tablet  3  . tiotropium (SPIRIVA HANDIHALER) 18 MCG inhalation capsule Place 1 capsule (18 mcg total) into inhaler and inhale daily.  20 capsule  0  . [DISCONTINUED] Omega-3 Fatty Acids (FISH OIL) 1000 MG CAPS Take 1 capsule by mouth daily.        . [DISCONTINUED] ramipril (ALTACE) 5 MG capsule Take 1 capsule (5 mg total) by mouth daily.  90 capsule  5  . Diphenhyd-Hydrocort-Nystatin SUSP Use as directed 5 mLs in the mouth or throat 4 (four) times daily.  8 oz  1  . losartan (COZAAR) 25 MG tablet Take 1 tablet (25 mg total) by mouth daily.  30 tablet  6  . [DISCONTINUED] fluconazole (DIFLUCAN) 100 MG tablet 2 initially then 1 daily  15 tablet  0

## 2012-11-07 NOTE — Assessment & Plan Note (Signed)
Chronic obstructive lung disease gold stage B. stable at this time Associated reflux disease Plan Follow reflux diet Use flaxseed crushed on salads instead of fish oil Stop fish oil Start losartan 25mg  /d  Stop ramipril Stay on Spiriva Return 4 months

## 2012-11-07 NOTE — Patient Instructions (Addendum)
Follow reflux diet Use flaxseed crushed on salads instead of fish oil Stop fish oil Start losartan 25mg  /d  Stop ramipril Stay on Spiriva Return 4 months

## 2012-11-15 ENCOUNTER — Other Ambulatory Visit: Payer: Self-pay | Admitting: Internal Medicine

## 2012-11-16 ENCOUNTER — Other Ambulatory Visit: Payer: Self-pay | Admitting: Internal Medicine

## 2012-11-16 MED ORDER — LORAZEPAM 0.5 MG PO TABS: 0.5000 mg | ORAL_TABLET | Freq: Two times a day (BID) | ORAL | Status: DC | PRN

## 2012-11-17 ENCOUNTER — Other Ambulatory Visit: Payer: Self-pay | Admitting: Internal Medicine

## 2012-12-05 ENCOUNTER — Other Ambulatory Visit: Payer: Self-pay | Admitting: Internal Medicine

## 2012-12-06 ENCOUNTER — Other Ambulatory Visit: Payer: Self-pay | Admitting: Internal Medicine

## 2012-12-06 ENCOUNTER — Telehealth: Payer: Self-pay | Admitting: *Deleted

## 2012-12-06 NOTE — Telephone Encounter (Signed)
Left message on voicemail. Need to know what pharmacy pt uses.

## 2012-12-06 NOTE — Telephone Encounter (Signed)
Is returning a call to office but is unsure why was called. Advised per EPIC, office needs to know what pharmacy is used. [facility] uses [facility] [place].

## 2012-12-07 NOTE — Telephone Encounter (Signed)
Spoke to pt told her I received Rx refill from [facility] for Lorazapem but it was [facility] at [facility] does she need a refill yet. Pt stated no it was sent to the wrong pharmacy. Told her okay, let me know when need refill and I will send to correct pharmacy. Pt verbalized understanding.

## 2012-12-14 ENCOUNTER — Telehealth: Payer: Self-pay | Admitting: Critical Care Medicine

## 2012-12-14 DIAGNOSIS — J449 Chronic obstructive pulmonary disease, unspecified: Secondary | ICD-10-CM

## 2012-12-14 MED ORDER — TIOTROPIUM BROMIDE MONOHYDRATE 18 MCG IN CAPS: 18.0000 ug | ORAL_CAPSULE | Freq: Every day | RESPIRATORY_TRACT | Status: DC

## 2012-12-14 NOTE — Telephone Encounter (Signed)
Spiriva samples left for pt to pick up and she has been notified of this.

## 2012-12-14 NOTE — Telephone Encounter (Signed)
Pt returned call. [name]  

## 2012-12-14 NOTE — Telephone Encounter (Signed)
[id number] x1 

## 2012-12-22 ENCOUNTER — Other Ambulatory Visit: Payer: Self-pay | Admitting: *Deleted

## 2012-12-22 MED ORDER — LORAZEPAM 0.5 MG PO TABS: 0.5000 mg | ORAL_TABLET | Freq: Two times a day (BID) | ORAL | Status: DC | PRN

## 2013-01-03 ENCOUNTER — Telehealth: Payer: Self-pay | Admitting: Internal Medicine

## 2013-01-03 NOTE — Telephone Encounter (Signed)
Patient Information:  Caller Name: Heer  Phone: [phone]  Patient: [name], [name]  Gender: Female  DOB: 05-31-1941  Age: 72 Years  PCP: [name])  Office Follow Up:  Does the office need to follow up with this patient?: Yes  Instructions For The Office: Pt needs to be seen now in office.  No appts available in EPIC.  PLEASE CALL PT BACK ASAP [phone] TO ADVISE IF SHE CAN BE WORKED IN OR IF YOU NEED TO ADVISE HER TO URGENT CARE.  Thanks.   Symptoms  Reason For Call & Symptoms: Pt calling today 01/03/13 regarding having episodes of dizziness.  Stayed in bed most of the day yesterday.  Still having dizziness today.  Reviewed Health History In EMR: Yes  Reviewed Medications In EMR: Yes  Reviewed Allergies In EMR: Yes  Reviewed Surgeries / Procedures: Yes  Date of Onset of Symptoms: 01/02/2013  Guideline(s) Used:  Dizziness  Disposition Per Guideline:   Go to Office Now  Reason For Disposition Reached:   Lightheadedness (dizziness) present now, after 2 hours of rest and fluids  Advice Given: Advises pt to call office back or call 911 if she feels faint or severe dizziness.

## 2013-01-03 NOTE — Telephone Encounter (Signed)
Appointment scheduled for tomorrow with Dr. Selena Batten at 10:45 am pt aware.

## 2013-01-03 NOTE — Telephone Encounter (Signed)
Spoke to pt c/o dizziness since yesterday off and on is a little better today. Asked pt if having ear pain? Pt denies. Told pt will have someone call her to schedule an appt tomorrow. Also if dizziness becomes worse or feels faint needs to call 911. Pt verbalized understanding.

## 2013-01-04 ENCOUNTER — Encounter: Payer: Self-pay | Admitting: Family Medicine

## 2013-01-04 ENCOUNTER — Ambulatory Visit (INDEPENDENT_AMBULATORY_CARE_PROVIDER_SITE_OTHER): Payer: Medicare Other | Admitting: Family Medicine

## 2013-01-04 VITALS — BP 122/80 | HR 98 | Temp 98.4°F | Wt 155.0 lb

## 2013-01-04 DIAGNOSIS — R42 Dizziness and giddiness: Secondary | ICD-10-CM

## 2013-01-04 DIAGNOSIS — J329 Chronic sinusitis, unspecified: Secondary | ICD-10-CM

## 2013-01-04 NOTE — Patient Instructions (Addendum)
INSTRUCTIONS FOR UPPER RESPIRATORY INFECTION:  -plenty of rest and fluids  -nasal saline wash 2-3 times daily (use prepackaged nasal saline or bottled/distilled water if making your own)   -can use sinex nasal spray for drainage and nasal congestion - but do NOT use longer then 3-4 days  -in the winter time, using a humidifier at night is helpful (please follow cleaning instructions)  -if you are taking a cough medication - use only as directed, may also try a teaspoon of honey to coat the throat and throat lozenges  -for sore throat, salt water gargles can help  -follow up if you have fevers, facial pain, tooth pain, difficulty breathing or are worsening or not getting better in 5-7 days  

## 2013-01-04 NOTE — Progress Notes (Signed)
Chief Complaint  Patient presents with  . Dizziness    head feels full    HPI:  Acute visit for " dizziness" and sinus pressure: -started 3 days ago -symptoms: room spinning when got up out of bed or bent her head down fast - lasted a short time then when away, head feels full like sinus pressure - dizziness has resolved, but head feels full -tried some otc sinus medications which helped -denies: CP, SOB, palpitations, fevers, dysuria, HA, vision changes, NVD, cough -has long hx of sinus/allergy issues - takes claritin  HTN: -BP mildly elevated here but 122/80 when I checked it -home log she brought with her shows BP 118/55 at home  ROS: See pertinent positives and negatives per HPI.  Past Medical History  Diagnosis Date  . ANEMIA 08/28/2010  . 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 COLONIC POLYPS, HX OF 06/15/2007  . 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 CORONARY ARTERY DISEASE 06/15/2007  . DEPRESSIVE DISORDER 06/06/2008  . GERD 12/19/2009  . HYPERLIPIDEMIA 06/15/2007  . HYPERTENSION 06/15/2007  . HYPOTENSION 08/11/2010  . OSTEOARTHRITIS 06/15/2007  . PEDAL EDEMA 06/06/2009  . PERIPHERAL VASCULAR DISEASE 06/15/2007  . Lumbar stenosis   . Lung abscess 2011  . CHF (congestive heart failure)   . Hx of colonoscopy     Family History  Problem Relation Age of Onset  . Diabetes Father   . Heart attack Mother   . Diabetes    . Coronary artery disease    . Colon cancer Neg Hx     History   Social History  . Marital Status: Widowed    Spouse Name: N/A    Number of Children: 2  . Years of Education: N/A   Occupational History  .     Social History Main Topics  . Smoking status: Former Smoker -- 1.5 packs/day for 40 years    Types: Cigarettes    Quit date: 06/29/2000  . Smokeless tobacco: Never Used  . Alcohol Use: No  . Drug Use: No  . Sexually Active: None   Other Topics Concern  . None   Social History Narrative  . None    Current outpatient prescriptions:acetaminophen  (TYLENOL) 325 MG tablet, Take 650 mg by mouth every 6 (six) hours as needed.  , Disp: , Rfl: ;  chlorthalidone (HYGROTON) 25 MG tablet, Take 1 tablet (25 mg total) by mouth daily., Disp: 90 tablet, Rfl: 3;  cilostazol (PLETAL) 100 MG tablet, Take 1 tablet (100 mg total) by mouth 2 (two) times daily., Disp: 180 tablet, Rfl: 5 Diphenhyd-Hydrocort-Nystatin SUSP, Use as directed 5 mLs in the mouth or throat 4 (four) times daily., Disp: 8 oz, Rfl: 1;  felodipine (PLENDIL) 5 MG 24 hr tablet, Take 1 tablet (5 mg total) by mouth daily., Disp: 90 tablet, Rfl: 6;  gabapentin (NEURONTIN) 600 MG tablet, Take 1 tablet (600 mg total) by mouth 3 (three) times daily., Disp: 180 tablet, Rfl: 4 HYDROcodone-acetaminophen (VICODIN) 5-500 MG per tablet, TAKE 1/2 TABLET 6 TIMES EACH DAY, Disp: 90 tablet, Rfl: 1;  isosorbide mononitrate (IMDUR) 30 MG 24 hr tablet, Take 1 tablet (30 mg total) by mouth daily., Disp: 90 tablet, Rfl: 3;  LORazepam (ATIVAN) 0.5 MG tablet, Take 1 tablet (0.5 mg total) by mouth 2 (two) times daily as needed., Disp: 60 tablet, Rfl: 0 losartan (COZAAR) 25 MG tablet, Take 1 tablet (25 mg total) by mouth daily., Disp: 30 tablet, Rfl: 6;  nitroGLYCERIN (NITROSTAT) 0.4 MG SL tablet, Place  0.4 mg under the tongue every 5 (five) minutes as needed., Disp: , Rfl: ;  omeprazole (PRILOSEC) 20 MG capsule, Take 1 capsule (20 mg total) by mouth 2 (two) times daily., Disp: 90 capsule, Rfl: 5 promethazine (PHENERGAN) 25 MG tablet, TAKE ONE TABLET BY MOUTH EVERY 6 HOURS FOR NAUSEA, Disp: 20 tablet, Rfl: 0;  sertraline (ZOLOFT) 50 MG tablet, Take 1 tablet (50 mg total) by mouth daily., Disp: 90 tablet, Rfl: 5;  simvastatin (ZOCOR) 80 MG tablet, Take 1 tablet (80 mg total) by mouth at bedtime., Disp: 90 tablet, Rfl: 3;  simvastatin (ZOCOR) 80 MG tablet, TAKE 1 TABLET BY MOUTH EVERY DAY, Disp: 90 tablet, Rfl: 3 tiotropium (SPIRIVA HANDIHALER) 18 MCG inhalation capsule, Place 1 capsule (18 mcg total) into inhaler and inhale  daily., Disp: 20 capsule, Rfl: 0;  Chlorpheniramine-Acetaminophen (CORICIDIN HBP COLD/FLU PO), Take 1 tablet by mouth daily as needed., Disp: , Rfl:   EXAM:  Filed Vitals:   01/04/13 1048  BP: 122/80  Pulse: 98  Temp: 98.4 F (36.9 C)    There is no height on file to calculate BMI.  GENERAL: vitals reviewed and listed above, alert, oriented, appears well hydrated and in no acute distress  HEENT: atraumatic, conjunttiva clear, no obvious abnormalities on inspection of external nose and ears, normal appearance of ear canals and TMs except mild effusion L clear, clear nasal congestion, mild post oropharyngeal erythema with PND, no tonsillar edema or exudate, no sinus TTP  NECK: no obvious masses on inspection  LUNGS: clear to auscultation bilaterally, no wheezes, rales or rhonchi, good air movement  CV: HRRR, SEM, no peripheral edema  MS: moves all extremities without noticeable abnormality  PSYCH: pleasant and cooperative, no obvious depression or anxiety  ASSESSMENT AND PLAN:  Discussed the following assessment and plan:  1. Rhinosinusitis   2. Dizziness    -likely VURI versus AR - advised supportive care -dizziness has resolved - sounds like mild BPPV or labrythitis - advised if recurring or new symptoms to see a doctor immediately, advised to take caution when getting up from bending over and when first standing -Patient advised to return or notify a doctor immediately if symptoms worsen or persist or new concerns arise.  Patient Instructions  INSTRUCTIONS FOR UPPER RESPIRATORY INFECTION:  -plenty of rest and fluids  -nasal saline wash 2-3 times daily (use prepackaged nasal saline or bottled/distilled water if making your own)   -can use sinex/ nasal spray for drainage and nasal congestion - but do NOT use longer then 3-4 days  -in the winter time, using a humidifier at night is helpful (please follow cleaning instructions)  -if you are taking a cough medication -  use only as directed, may also try a teaspoon of honey to coat the throat and throat lozenges  -for sore throat, salt water gargles can help  -follow up if you have fevers, facial pain, tooth pain, difficulty breathing or are worsening or not getting better in 5-7 days      KIM, HANNAH R.

## 2013-01-18 ENCOUNTER — Telehealth: Payer: Self-pay | Admitting: Critical Care Medicine

## 2013-01-18 DIAGNOSIS — J449 Chronic obstructive pulmonary disease, unspecified: Secondary | ICD-10-CM

## 2013-01-18 MED ORDER — TIOTROPIUM BROMIDE MONOHYDRATE 18 MCG IN CAPS: 18.0000 ug | ORAL_CAPSULE | Freq: Every day | RESPIRATORY_TRACT | Status: DC

## 2013-01-18 NOTE — Telephone Encounter (Signed)
LM for pt that samples of spiriva were left at front desk.

## 2013-02-15 ENCOUNTER — Ambulatory Visit (HOSPITAL_COMMUNITY): Payer: Medicare Other | Attending: Cardiology | Admitting: Radiology

## 2013-02-15 ENCOUNTER — Encounter: Payer: Self-pay | Admitting: Cardiology

## 2013-02-15 ENCOUNTER — Ambulatory Visit (INDEPENDENT_AMBULATORY_CARE_PROVIDER_SITE_OTHER): Payer: Medicare Other | Admitting: Cardiology

## 2013-02-15 VITALS — BP 128/70 | HR 87 | Ht 62.5 in | Wt 158.0 lb

## 2013-02-15 DIAGNOSIS — R011 Cardiac murmur, unspecified: Secondary | ICD-10-CM | POA: Insufficient documentation

## 2013-02-15 DIAGNOSIS — I350 Nonrheumatic aortic (valve) stenosis: Secondary | ICD-10-CM

## 2013-02-15 DIAGNOSIS — I2510 Atherosclerotic heart disease of native coronary artery without angina pectoris: Secondary | ICD-10-CM | POA: Insufficient documentation

## 2013-02-15 DIAGNOSIS — I379 Nonrheumatic pulmonary valve disorder, unspecified: Secondary | ICD-10-CM | POA: Insufficient documentation

## 2013-02-15 DIAGNOSIS — I359 Nonrheumatic aortic valve disorder, unspecified: Secondary | ICD-10-CM

## 2013-02-15 DIAGNOSIS — I059 Rheumatic mitral valve disease, unspecified: Secondary | ICD-10-CM | POA: Insufficient documentation

## 2013-02-15 DIAGNOSIS — I739 Peripheral vascular disease, unspecified: Secondary | ICD-10-CM

## 2013-02-15 DIAGNOSIS — I679 Cerebrovascular disease, unspecified: Secondary | ICD-10-CM

## 2013-02-15 DIAGNOSIS — I079 Rheumatic tricuspid valve disease, unspecified: Secondary | ICD-10-CM | POA: Insufficient documentation

## 2013-02-15 DIAGNOSIS — J449 Chronic obstructive pulmonary disease, unspecified: Secondary | ICD-10-CM | POA: Insufficient documentation

## 2013-02-15 DIAGNOSIS — E785 Hyperlipidemia, unspecified: Secondary | ICD-10-CM | POA: Insufficient documentation

## 2013-02-15 DIAGNOSIS — J4489 Other specified chronic obstructive pulmonary disease: Secondary | ICD-10-CM | POA: Insufficient documentation

## 2013-02-15 DIAGNOSIS — I10 Essential (primary) hypertension: Secondary | ICD-10-CM | POA: Insufficient documentation

## 2013-02-15 NOTE — Assessment & Plan Note (Signed)
Stable with good pulses. Continue secondary preventative therapy.

## 2013-02-15 NOTE — Patient Instructions (Addendum)
Your physician has requested that you have an echocardiogram. Echocardiography is a painless test that uses sound waves to create images of your heart. It provides your doctor with information about the size and shape of your heart and how well your heart's chambers and valves are working. This procedure takes approximately one hour. There are no restrictions for this procedure.  Your physician recommends that you continue on your current medications as directed. Please refer to the Current Medication list given to you today.   Your physician has requested that you have a carotid duplex in November 2014. This test is an ultrasound of the carotid arteries in your neck. It looks at blood flow through these arteries that supply the brain with blood. Allow one hour for this exam. There are no restrictions or special instructions.  Your physician recommends that you schedule a follow-up appointment in: 1 year with our office

## 2013-02-15 NOTE — Progress Notes (Signed)
HPI [name] returns today for evaluation and management of her coronary artery disease, history bypass surgery, and history of carotid disease.  She's having no angina, presyncope or syncope, or palpitations. She really denies any dyspnea on exertion.  Primary care checks her blood work. She's very compliant with her medications. Carotid Dopplers last year in November were stable.  Past Medical History  Diagnosis Date  . ANEMIA 08/28/2010  . 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 COLONIC POLYPS, HX OF 06/15/2007  . 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 CORONARY ARTERY DISEASE 06/15/2007  . DEPRESSIVE DISORDER 06/06/2008  . GERD 12/19/2009  . HYPERLIPIDEMIA 06/15/2007  . HYPERTENSION 06/15/2007  . HYPOTENSION 08/11/2010  . OSTEOARTHRITIS 06/15/2007  . PEDAL EDEMA 06/06/2009  . PERIPHERAL VASCULAR DISEASE 06/15/2007  . Lumbar stenosis   . Lung abscess 2011  . CHF (congestive heart failure)   . Hx of colonoscopy     Current Outpatient Prescriptions  Medication Sig Dispense Refill  . acetaminophen (TYLENOL) 325 MG tablet Take 650 mg by mouth every 6 (six) hours as needed.        Marland Kitchen aspirin 81 MG tablet Take 81 mg by mouth daily.      . chlorthalidone (HYGROTON) 25 MG tablet Take 1 tablet (25 mg total) by mouth daily.  90 tablet  3  . cilostazol (PLETAL) 100 MG tablet Take 100 mg by mouth daily.      . felodipine (PLENDIL) 5 MG 24 hr tablet Take 1 tablet (5 mg total) by mouth daily.  90 tablet  6  . gabapentin (NEURONTIN) 600 MG tablet Take 1 tablet (600 mg total) by mouth 3 (three) times daily.  180 tablet  4  . HYDROcodone-acetaminophen (NORCO/VICODIN) 5-325 MG per tablet as needed.      . isosorbide mononitrate (IMDUR) 30 MG 24 hr tablet Take 1 tablet (30 mg total) by mouth daily.  90 tablet  3  . LORazepam (ATIVAN) 0.5 MG tablet Take 1 tablet (0.5 mg total) by mouth 2 (two) times daily as needed.  60 tablet  0  . losartan (COZAAR) 25 MG tablet Take 1 tablet (25 mg total) by mouth daily.  30 tablet  6   . nitroGLYCERIN (NITROSTAT) 0.4 MG SL tablet Place 0.4 mg under the tongue every 5 (five) minutes as needed.      Marland Kitchen omeprazole (PRILOSEC) 20 MG capsule Take 1 capsule (20 mg total) by mouth 2 (two) times daily.  90 capsule  5  . promethazine (PHENERGAN) 25 MG tablet TAKE ONE TABLET BY MOUTH EVERY 6 HOURS FOR NAUSEA  20 tablet  0  . sertraline (ZOLOFT) 50 MG tablet Take 1 tablet (50 mg total) by mouth daily.  90 tablet  5  . simvastatin (ZOCOR) 80 MG tablet Take 1 tablet (80 mg total) by mouth at bedtime.  90 tablet  3  . tiotropium (SPIRIVA HANDIHALER) 18 MCG inhalation capsule Place 1 capsule (18 mcg total) into inhaler and inhale daily.  20 capsule  0  . Chlorpheniramine-Acetaminophen (CORICIDIN HBP COLD/FLU PO) Take 1 tablet by mouth daily as needed.      . Diphenhyd-Hydrocort-Nystatin SUSP Use as directed 5 mLs in the mouth or throat 4 (four) times daily.  8 oz  1   No current facility-administered medications for this visit.    Allergies  Allergen Reactions  . Codeine Phosphate     REACTION: unspecified    Family History  Problem Relation Age of Onset  . Diabetes Father   .  Heart attack Mother   . Diabetes    . Coronary artery disease    . Colon cancer Neg Hx     History   Social History  . Marital Status: Widowed    Spouse Name: N/A    Number of Children: 2  . Years of Education: N/A   Occupational History  .     Social History Main Topics  . Smoking status: Former Smoker -- 1.50 packs/day for 40 years    Types: Cigarettes    Quit date: 06/29/2000  . Smokeless tobacco: Never Used  . Alcohol Use: No  . Drug Use: No  . Sexually Active: Not on file   Other Topics Concern  . Not on file   Social History Narrative  . No narrative on file    ROS ALL NEGATIVE EXCEPT THOSE NOTED IN HPI  PE  General Appearance: well developed, well nourished in no acute distress HEENT: symmetrical face, PERRLA, good dentition  Neck: no JVD, thyromegaly, or adenopathy,  trachea midline Chest: symmetric without deformity Cardiac: PMI non-displaced, RRR, normal S1, S2, no gallop, 3/6 systolic murmur consistent with aortic sclerosis or stenosis. Difficult to hear S2 split clear to ausculation and percussion Vascular: all pulses full without bruits  Abdominal: nondistended, nontender, good bowel sounds, no HSM, no bruits Extremities: no cyanosis, clubbing or edema, no sign of DVT, no varicosities  Skin: normal color, no rashes Neuro: alert and oriented x 3, non-focal Pysch: normal affect  EKG Normal sinus rhythm nonspecific ST segment changes. BMET    Component Value Date/Time   NA 140 09/18/2012 0922   K 4.1 09/18/2012 0922   CL 100 09/18/2012 0922   CO2 29 09/18/2012 0922   GLUCOSE 86 09/18/2012 0922   GLUCOSE 91 10/31/2006 0948   BUN 33* 09/18/2012 0922   CREATININE 1.1 09/18/2012 0922   CALCIUM 9.7 09/18/2012 0922   GFRNONAA 48* 08/24/2010 0259   GFRAA  Value: 58        The eGFR has been calculated using the MDRD equation. This calculation has not been validated in all clinical situations. eGFR's persistently <60 mL/min signify possible Chronic Kidney Disease.* 08/24/2010 0259    Lipid Panel     Component Value Date/Time   CHOL 165 09/18/2012 0922   TRIG 157.0* 09/18/2012 0922   HDL 69.50 09/18/2012 0922   CHOLHDL 2 09/18/2012 0922   VLDL 31.4 09/18/2012 0922   LDLCALC 64 09/18/2012 0922    CBC    Component Value Date/Time   WBC 11.2* 09/18/2012 0922   RBC 4.50 09/18/2012 0922   HGB 12.9 09/18/2012 0922   HCT 39.7 09/18/2012 0922   PLT 302.0 09/18/2012 0922   MCV 88.2 09/18/2012 0922   MCH 27.8 08/24/2010 0259   MCHC 32.4 09/18/2012 0922   RDW 14.8* 09/18/2012 0922   LYMPHSABS 3.6 09/18/2012 0922   MONOABS 0.7 09/18/2012 0922   EOSABS 0.1 09/18/2012 0922   BASOSABS 0.0 09/18/2012 1610

## 2013-02-15 NOTE — Assessment & Plan Note (Signed)
Her last echo in 2011 showed aortic valve thickening. I suspect she now has mild aortic stenosis or more. We'll obtain 2-D echocardiogram.

## 2013-02-15 NOTE — Assessment & Plan Note (Signed)
Stable. Continue secondary preventative therapy. 

## 2013-02-15 NOTE — Assessment & Plan Note (Signed)
Stable in November of 13. Continue secondary preventative therapy. Re study in November 14.

## 2013-02-15 NOTE — Progress Notes (Signed)
Echocardiogram performed.  

## 2013-02-16 ENCOUNTER — Telehealth: Payer: Self-pay | Admitting: Cardiology

## 2013-02-16 NOTE — Telephone Encounter (Signed)
Pt aware of echo results. She would like Caryn Bee to know " he was just exceptionally well, He was great on the phone!" Mylo Red RN

## 2013-02-16 NOTE — Telephone Encounter (Signed)
New Problem:    Patient called returning your call regarding her recent ECHO results.  Please call back.

## 2013-02-26 ENCOUNTER — Telehealth: Payer: Self-pay | Admitting: Critical Care Medicine

## 2013-02-26 DIAGNOSIS — J449 Chronic obstructive pulmonary disease, unspecified: Secondary | ICD-10-CM

## 2013-02-26 MED ORDER — TIOTROPIUM BROMIDE MONOHYDRATE 18 MCG IN CAPS: 18.0000 ug | ORAL_CAPSULE | Freq: Every day | RESPIRATORY_TRACT | Status: DC

## 2013-02-26 NOTE — Telephone Encounter (Signed)
Samples at front. Pt is aware. [name], CMA  

## 2013-03-19 ENCOUNTER — Ambulatory Visit (INDEPENDENT_AMBULATORY_CARE_PROVIDER_SITE_OTHER): Payer: Medicare Other | Admitting: Internal Medicine

## 2013-03-19 ENCOUNTER — Encounter: Payer: Self-pay | Admitting: Internal Medicine

## 2013-03-19 VITALS — BP 126/70 | HR 69 | Temp 98.4°F | Resp 20 | Wt 160.0 lb

## 2013-03-19 DIAGNOSIS — I10 Essential (primary) hypertension: Secondary | ICD-10-CM

## 2013-03-19 DIAGNOSIS — J449 Chronic obstructive pulmonary disease, unspecified: Secondary | ICD-10-CM

## 2013-03-19 DIAGNOSIS — I359 Nonrheumatic aortic valve disorder, unspecified: Secondary | ICD-10-CM

## 2013-03-19 DIAGNOSIS — I2510 Atherosclerotic heart disease of native coronary artery without angina pectoris: Secondary | ICD-10-CM

## 2013-03-19 DIAGNOSIS — I350 Nonrheumatic aortic (valve) stenosis: Secondary | ICD-10-CM

## 2013-03-19 DIAGNOSIS — I25810 Atherosclerosis of coronary artery bypass graft(s) without angina pectoris: Secondary | ICD-10-CM

## 2013-03-19 DIAGNOSIS — E785 Hyperlipidemia, unspecified: Secondary | ICD-10-CM

## 2013-03-19 NOTE — Progress Notes (Signed)
Subjective:    Patient ID: [name], female    DOB: Aug 06, 1941, 72 y.o.   MRN: [id number]  HPI  72 year old patient who is seen today for her biannual followup. She is followed both by cardiology and pulmonary medicine. She has COPD and a history of coronary artery disease she has peripheral vascular disease and carotid artery disease. Her cardiac and pulmonary status has been stable although she is recovering from a URI. She is using hydrocodone when necessary for arthritic pain. She was seen by cardiology last month an EKG was reviewed. This revealed frequent PACs  Past Medical History  Diagnosis Date  . ANEMIA 08/28/2010  . 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 COLONIC POLYPS, HX OF 06/15/2007  . 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 CORONARY ARTERY DISEASE 06/15/2007  . DEPRESSIVE DISORDER 06/06/2008  . GERD 12/19/2009  . HYPERLIPIDEMIA 06/15/2007  . HYPERTENSION 06/15/2007  . HYPOTENSION 08/11/2010  . OSTEOARTHRITIS 06/15/2007  . PEDAL EDEMA 06/06/2009  . PERIPHERAL VASCULAR DISEASE 06/15/2007  . Lumbar stenosis   . Lung abscess 2011  . CHF (congestive heart failure)   . Hx of colonoscopy     History   Social History  . Marital Status: Widowed    Spouse Name: N/A    Number of Children: 2  . Years of Education: N/A   Occupational History  .     Social History Main Topics  . Smoking status: Former Smoker -- 1.50 packs/day for 40 years    Types: Cigarettes    Quit date: 06/29/2000  . Smokeless tobacco: Never Used  . Alcohol Use: No  . Drug Use: No  . Sexually Active: Not on file   Other Topics Concern  . Not on file   Social History Narrative  . No narrative on file    Past Surgical History  Procedure Laterality Date  . L subclavian bypass  07/2004  . Tubal ligation    . Coronary artery bypass graft  05/2000  . Femoral popliteal bypass-r  04/1999  . Right common iliac pta with stent placement  07/2000  . Right leg blockage  2003  . Aorta bifemoral bypass graft  2004  . Left  cea  2005  . Lung cyst biopsy  2011    Family History  Problem Relation Age of Onset  . Diabetes Father   . Heart attack Mother   . Diabetes    . Coronary artery disease    . Colon cancer Neg Hx     Allergies  Allergen Reactions  . Codeine Phosphate     REACTION: unspecified    Current Outpatient Prescriptions on File Prior to Visit  Medication Sig Dispense Refill  . acetaminophen (TYLENOL) 325 MG tablet Take 650 mg by mouth every 6 (six) hours as needed.        Marland Kitchen aspirin 81 MG tablet Take 81 mg by mouth daily.      . Chlorpheniramine-Acetaminophen (CORICIDIN HBP COLD/FLU PO) Take 1 tablet by mouth daily as needed.      . chlorthalidone (HYGROTON) 25 MG tablet Take 1 tablet (25 mg total) by mouth daily.  90 tablet  3  . cilostazol (PLETAL) 100 MG tablet Take 100 mg by mouth daily.      . felodipine (PLENDIL) 5 MG 24 hr tablet Take 1 tablet (5 mg total) by mouth daily.  90 tablet  6  . gabapentin (NEURONTIN) 600 MG tablet Take 1 tablet (600 mg total) by mouth 3 (three) times daily.  180 tablet  4  . HYDROcodone-acetaminophen (NORCO/VICODIN) 5-325 MG per tablet as needed.      . isosorbide mononitrate (IMDUR) 30 MG 24 hr tablet Take 1 tablet (30 mg total) by mouth daily.  90 tablet  3  . LORazepam (ATIVAN) 0.5 MG tablet Take 1 tablet (0.5 mg total) by mouth 2 (two) times daily as needed.  60 tablet  0  . losartan (COZAAR) 25 MG tablet Take 1 tablet (25 mg total) by mouth daily.  30 tablet  6  . nitroGLYCERIN (NITROSTAT) 0.4 MG SL tablet Place 0.4 mg under the tongue every 5 (five) minutes as needed.      Marland Kitchen omeprazole (PRILOSEC) 20 MG capsule Take 1 capsule (20 mg total) by mouth 2 (two) times daily.  90 capsule  5  . promethazine (PHENERGAN) 25 MG tablet TAKE ONE TABLET BY MOUTH EVERY 6 HOURS FOR NAUSEA  20 tablet  0  . sertraline (ZOLOFT) 50 MG tablet Take 1 tablet (50 mg total) by mouth daily.  90 tablet  5  . simvastatin (ZOCOR) 80 MG tablet Take 1 tablet (80 mg total) by mouth  at bedtime.  90 tablet  3  . tiotropium (SPIRIVA HANDIHALER) 18 MCG inhalation capsule Place 1 capsule (18 mcg total) into inhaler and inhale daily.  30 capsule  0   No current facility-administered medications on file prior to visit.    BP 126/70  Pulse 69  Temp(Src) 98.4 F (36.9 C) (Oral)  Resp 20  Wt 160 lb (72.576 kg)  BMI 28.78 kg/m2  SpO2 94%       Review of Systems  Constitutional: Negative.   HENT: Positive for congestion. Negative for hearing loss, sore throat, rhinorrhea, dental problem, sinus pressure and tinnitus.   Eyes: Negative for pain, discharge and visual disturbance.  Respiratory: Positive for cough and shortness of breath.   Cardiovascular: Negative for chest pain, palpitations and leg swelling.  Gastrointestinal: Negative for nausea, vomiting, abdominal pain, diarrhea, constipation, blood in stool and abdominal distention.  Genitourinary: Negative for dysuria, urgency, frequency, hematuria, flank pain, vaginal bleeding, vaginal discharge, difficulty urinating, vaginal pain and pelvic pain.  Musculoskeletal: Negative for joint swelling, arthralgias and gait problem.  Skin: Negative for rash.  Neurological: Negative for dizziness, syncope, speech difficulty, weakness, numbness and headaches.  Hematological: Negative for adenopathy.  Psychiatric/Behavioral: Negative for behavioral problems, dysphoric mood and agitation. The patient is not nervous/anxious.        Objective:   Physical Exam  Constitutional: She is oriented to person, place, and time. She appears well-developed and well-nourished.  HENT:  Head: Normocephalic.  Right Ear: External ear normal.  Left Ear: External ear normal.  Mouth/Throat: Oropharynx is clear and moist.  Eyes: Conjunctivae and EOM are normal. Pupils are equal, round, and reactive to light.  Neck: Normal range of motion. Neck supple. No thyromegaly present.  Transmitted murmur versus bruits in the carotid and supraclavicular  distributions  Cardiovascular: Normal rate, normal heart sounds and intact distal pulses.   Grade 3/6 systolic murmur loudest at the primary aortic area Frequent ectopics  Pulmonary/Chest: Effort normal and breath sounds normal.  Abdominal: Soft. Bowel sounds are normal. She exhibits no mass. There is no tenderness.  Musculoskeletal: Normal range of motion.  Lymphadenopathy:    She has no cervical adenopathy.  Neurological: She is alert and oriented to person, place, and time.  Skin: Skin is warm and dry. No rash noted.  Psychiatric: She has a normal mood and affect. Her  behavior is normal.          Assessment & Plan:   Hypertension controlled Coronary artery disease stable Dyslipidemia. Continue simvastatin 80 Osteoarthritis Peripheral vascular disease   Recheck 6 months

## 2013-03-19 NOTE — Patient Instructions (Signed)
Limit your sodium (Salt) intake    It is important that you exercise regularly, at least 20 minutes 3 to 4 times per week.  If you develop chest pain or shortness of breath seek  medical attention.  Return in 6 months for follow-up  

## 2013-03-21 ENCOUNTER — Other Ambulatory Visit: Payer: Self-pay | Admitting: Internal Medicine

## 2013-03-28 ENCOUNTER — Ambulatory Visit (INDEPENDENT_AMBULATORY_CARE_PROVIDER_SITE_OTHER): Payer: Medicare Other | Admitting: Critical Care Medicine

## 2013-03-28 ENCOUNTER — Encounter: Payer: Self-pay | Admitting: Critical Care Medicine

## 2013-03-28 VITALS — BP 124/70 | HR 93 | Temp 97.6°F | Ht 62.5 in | Wt 157.0 lb

## 2013-03-28 DIAGNOSIS — J449 Chronic obstructive pulmonary disease, unspecified: Secondary | ICD-10-CM

## 2013-03-28 DIAGNOSIS — J4489 Other specified chronic obstructive pulmonary disease: Secondary | ICD-10-CM

## 2013-03-28 MED ORDER — TIOTROPIUM BROMIDE MONOHYDRATE 18 MCG IN CAPS: 18.0000 ug | ORAL_CAPSULE | Freq: Every day | RESPIRATORY_TRACT | Status: DC

## 2013-03-28 NOTE — Patient Instructions (Addendum)
No change in medications. Return in         4 months 

## 2013-03-28 NOTE — Progress Notes (Signed)
Subjective:    Patient ID: [name], female    DOB: 09-11-1941, 72 y.o.   MRN: [id number]  HPI  72 y.o.  WF with RML lung abscess no other risk factors and neg FOB for endobronchial lesion 9/11. Mild COPD ex smoker 60pack years. Normal FeV1, Fef25-75 39% predicted   03/28/2013 Since last ov: last two weeks, URI syndrome.  Pt saw PCP, assumed viral and rx OTC.  No chest pain.  Notes some mucus, white to yellow.   Emesis with coughing.  Dyspnea with exertion.  No change with dyspnea.      Past Medical History  Diagnosis Date  . ANEMIA 08/28/2010  . 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 COLONIC POLYPS, HX OF 06/15/2007  . 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 CORONARY ARTERY DISEASE 06/15/2007  . DEPRESSIVE DISORDER 06/06/2008  . GERD 12/19/2009  . HYPERLIPIDEMIA 06/15/2007  . HYPERTENSION 06/15/2007  . HYPOTENSION 08/11/2010  . OSTEOARTHRITIS 06/15/2007  . PEDAL EDEMA 06/06/2009  . PERIPHERAL VASCULAR DISEASE 06/15/2007  . Lumbar stenosis   . Lung abscess 2011  . CHF (congestive heart failure)   . Hx of colonoscopy      Family History  Problem Relation Age of Onset  . Diabetes Father   . Heart attack Mother   . Diabetes    . Coronary artery disease    . Colon cancer Neg Hx      History   Social History  . Marital Status: Widowed    Spouse Name: N/A    Number of Children: 2  . Years of Education: N/A   Occupational History  .     Social History Main Topics  . Smoking status: Former Smoker -- 1.50 packs/day for 40 years    Types: Cigarettes    Quit date: 06/29/2000  . Smokeless tobacco: Never Used  . Alcohol Use: No  . Drug Use: No  . Sexually Active: Not on file   Other Topics Concern  . Not on file   Social History Narrative  . No narrative on file     Allergies  Allergen Reactions  . Codeine Phosphate     REACTION: unspecified     Outpatient Prescriptions Prior to Visit  Medication Sig Dispense Refill  . acetaminophen (TYLENOL) 325 MG tablet Take 650 mg by  mouth every 6 (six) hours as needed.        Marland Kitchen aspirin 81 MG tablet Take 81 mg by mouth daily.      . Chlorpheniramine-Acetaminophen (CORICIDIN HBP COLD/FLU PO) Take 1 tablet by mouth daily as needed.      . chlorthalidone (HYGROTON) 25 MG tablet Take 1 tablet (25 mg total) by mouth daily.  90 tablet  3  . cilostazol (PLETAL) 100 MG tablet Take 100 mg by mouth daily.      . felodipine (PLENDIL) 5 MG 24 hr tablet Take 1 tablet (5 mg total) by mouth daily.  90 tablet  6  . gabapentin (NEURONTIN) 600 MG tablet Take 1 tablet (600 mg total) by mouth 3 (three) times daily.  180 tablet  4  . HYDROcodone-acetaminophen (NORCO/VICODIN) 5-325 MG per tablet TAKE 1/2 TABLET BY MOUTH 6 TIMES A DAY  90 tablet  0  . isosorbide mononitrate (IMDUR) 30 MG 24 hr tablet Take 1 tablet (30 mg total) by mouth daily.  90 tablet  3  . LORazepam (ATIVAN) 0.5 MG tablet TAKE 1 TABLET BY MOUTH TWICE A DAY  60 tablet  0  . losartan (  COZAAR) 25 MG tablet Take 1 tablet (25 mg total) by mouth daily.  30 tablet  6  . nitroGLYCERIN (NITROSTAT) 0.4 MG SL tablet Place 0.4 mg under the tongue every 5 (five) minutes as needed.      Marland Kitchen omeprazole (PRILOSEC) 20 MG capsule Take 1 capsule (20 mg total) by mouth 2 (two) times daily.  90 capsule  5  . promethazine (PHENERGAN) 25 MG tablet TAKE ONE TABLET BY MOUTH EVERY 6 HOURS FOR NAUSEA  20 tablet  0  . sertraline (ZOLOFT) 50 MG tablet Take 1 tablet (50 mg total) by mouth daily.  90 tablet  5  . simvastatin (ZOCOR) 80 MG tablet Take 1 tablet (80 mg total) by mouth at bedtime.  90 tablet  3  . tiotropium (SPIRIVA HANDIHALER) 18 MCG inhalation capsule Place 1 capsule (18 mcg total) into inhaler and inhale daily.  30 capsule  0   No facility-administered medications prior to visit.      Review of Systems  Constitutional:   No  weight loss, night sweats,  Fevers, chills,  +fatigue, lassitude. HEENT:   No headaches,  Difficulty swallowing,  Tooth/dental problems,  Sore throat,                 No sneezing, itching, ear ache, nasal congestion, post nasal drip,   CV:  No chest pain,  Orthopnea, PND, swelling in lower extremities, anasarca, dizziness, palpitations  GI  Notes  heartburn, notes indigestion, abdominal pain, nausea, vomiting, diarrhea, change in bowel habits, loss of appetite  Resp:    No coughing up of blood.  Notes change in color of mucus.  No wheezing.  No chest wall deformity  Skin: no rash or lesions.  GU: no dysuria, change in color of urine, no urgency or frequency.  No flank pain.  MS:  No joint pain or swelling.  No decreased range of motion.     Psych:  No change in mood or affect. No depression or anxiety.  No memory loss.     Objective:   Physical Exam  Filed Vitals:   03/28/13 1035  BP: 124/70  Pulse: 93  Temp: 97.6 F (36.4 C)  TempSrc: Oral  Height: 5' 2.5" (1.588 m)  Weight: 157 lb (71.215 kg)  SpO2: 95%    Gen: Pleasant,  in no distress,  normal affect  ENT: No lesions,  mouth clear,  oropharynx clear, no postnasal drip  Neck: No JVD, no TMG, no carotid bruits  Lungs: No use of accessory muscles, no dullness to percussion, Coarse BS   Cardiovascular: RRR, heart sounds normal, no murmur or gallops, no peripheral edema  Abdomen: soft and NT, no HSM,  BS normal  Musculoskeletal: No deformities, no cyanosis or clubbing  Neuro: alert, non focal  Skin: Warm, no lesions or rashes     No results found.  Assessment & Plan:   COPD Gold B Gold stage B. COPD with stable airway function on Spiriva Plan Maintain Spiriva daily Return 6 months    Updated Medication List Outpatient Encounter Prescriptions as of 03/28/2013  Medication Sig Dispense Refill  . acetaminophen (TYLENOL) 325 MG tablet Take 650 mg by mouth every 6 (six) hours as needed.        Marland Kitchen aspirin 81 MG tablet Take 81 mg by mouth daily.      . Chlorpheniramine-Acetaminophen (CORICIDIN HBP COLD/FLU PO) Take 1 tablet by mouth daily as needed.      . chlorthalidone  (HYGROTON) 25 MG  tablet Take 1 tablet (25 mg total) by mouth daily.  90 tablet  3  . cilostazol (PLETAL) 100 MG tablet Take 100 mg by mouth daily.      . felodipine (PLENDIL) 5 MG 24 hr tablet Take 1 tablet (5 mg total) by mouth daily.  90 tablet  6  . gabapentin (NEURONTIN) 600 MG tablet Take 1 tablet (600 mg total) by mouth 3 (three) times daily.  180 tablet  4  . HYDROcodone-acetaminophen (NORCO/VICODIN) 5-325 MG per tablet TAKE 1/2 TABLET BY MOUTH 6 TIMES A DAY  90 tablet  0  . isosorbide mononitrate (IMDUR) 30 MG 24 hr tablet Take 1 tablet (30 mg total) by mouth daily.  90 tablet  3  . LORazepam (ATIVAN) 0.5 MG tablet TAKE 1 TABLET BY MOUTH TWICE A DAY  60 tablet  0  . losartan (COZAAR) 25 MG tablet Take 1 tablet (25 mg total) by mouth daily.  30 tablet  6  . nitroGLYCERIN (NITROSTAT) 0.4 MG SL tablet Place 0.4 mg under the tongue every 5 (five) minutes as needed.      Marland Kitchen omeprazole (PRILOSEC) 20 MG capsule Take 1 capsule (20 mg total) by mouth 2 (two) times daily.  90 capsule  5  . promethazine (PHENERGAN) 25 MG tablet TAKE ONE TABLET BY MOUTH EVERY 6 HOURS FOR NAUSEA  20 tablet  0  . sertraline (ZOLOFT) 50 MG tablet Take 1 tablet (50 mg total) by mouth daily.  90 tablet  5  . simvastatin (ZOCOR) 80 MG tablet Take 1 tablet (80 mg total) by mouth at bedtime.  90 tablet  3  . tiotropium (SPIRIVA HANDIHALER) 18 MCG inhalation capsule Place 1 capsule (18 mcg total) into inhaler and inhale daily.  30 capsule  11  . [DISCONTINUED] tiotropium (SPIRIVA HANDIHALER) 18 MCG inhalation capsule Place 1 capsule (18 mcg total) into inhaler and inhale daily.  30 capsule  0   No facility-administered encounter medications on file as of 03/28/2013.

## 2013-03-28 NOTE — Assessment & Plan Note (Signed)
Gold stage B. COPD with stable airway function on Spiriva Plan Maintain Spiriva daily Return 6 months

## 2013-04-09 ENCOUNTER — Encounter (INDEPENDENT_AMBULATORY_CARE_PROVIDER_SITE_OTHER): Payer: Medicare Other

## 2013-04-09 DIAGNOSIS — I6529 Occlusion and stenosis of unspecified carotid artery: Secondary | ICD-10-CM

## 2013-05-02 ENCOUNTER — Other Ambulatory Visit: Payer: Self-pay | Admitting: Internal Medicine

## 2013-05-14 ENCOUNTER — Other Ambulatory Visit: Payer: Self-pay | Admitting: Internal Medicine

## 2013-06-08 ENCOUNTER — Other Ambulatory Visit: Payer: Self-pay | Admitting: Critical Care Medicine

## 2013-06-11 ENCOUNTER — Telehealth: Payer: Self-pay | Admitting: Internal Medicine

## 2013-06-11 MED ORDER — HYDROCHLOROTHIAZIDE 25 MG PO TABS: 25.0000 mg | ORAL_TABLET | Freq: Every day | ORAL | Status: DC

## 2013-06-11 NOTE — Telephone Encounter (Signed)
Spoke to pt told her new Rx for HCTZ sent to pharmacy. Pt verbalized understanding.

## 2013-06-11 NOTE — Telephone Encounter (Signed)
PT called back and stated that it is not her losartan that she needs a new RX for, its her chlorthalidone (HYGROTON) 25 MG tablet. She states that this RX is too expensive, and she would like to have a cheaper one. Please assist.

## 2013-06-11 NOTE — Telephone Encounter (Signed)
PT called and stated that she would like to receive a cheaper RX to replace losartan (COZAAR) 25 MG tablet. Please assist.

## 2013-06-11 NOTE — Telephone Encounter (Signed)
Please defer losartan rx to pt's PCP.  Thank you.

## 2013-06-11 NOTE — Telephone Encounter (Signed)
Change to HCTZ 25 #90 refill x4 one daily

## 2013-06-12 ENCOUNTER — Other Ambulatory Visit: Payer: Self-pay | Admitting: Critical Care Medicine

## 2013-06-13 NOTE — Telephone Encounter (Signed)
Spoke with [place] at [facility].  She will defer losartan rx to Dr. Charm Rings office, pt PCP, as he should really be managing pt's BP meds.  [place] is aware to call back if she doesn't get a response from PCP or if they are not willing to rx med.

## 2013-06-14 ENCOUNTER — Other Ambulatory Visit: Payer: Self-pay | Admitting: Critical Care Medicine

## 2013-06-15 ENCOUNTER — Telehealth: Payer: Self-pay | Admitting: Critical Care Medicine

## 2013-06-15 MED ORDER — LOSARTAN POTASSIUM 25 MG PO TABS: 25.0000 mg | ORAL_TABLET | Freq: Every day | ORAL | Status: DC

## 2013-06-15 NOTE — Telephone Encounter (Signed)
Rx has been sent in. Pt is aware. Nothing further was needed. 

## 2013-07-03 ENCOUNTER — Other Ambulatory Visit: Payer: Self-pay | Admitting: Internal Medicine

## 2013-07-17 ENCOUNTER — Telehealth: Payer: Self-pay | Admitting: Internal Medicine

## 2013-07-17 NOTE — Telephone Encounter (Signed)
Please call pt and schedule appointment for tomorrow or she can go to urgent care today.  No appointments available

## 2013-07-17 NOTE — Telephone Encounter (Signed)
Patient Information:  Caller Name: Khrystina  Phone: [phone]  Patient: [name], Edge  Gender: Female  DOB: 09/26/1941  Age: 72 Years  PCP: [name] [facility])  Office Follow Up:  Does the office need to follow up with this patient?: Yes  Instructions For The Office: Has see today in office disposition- No appointments available with Dr. Freida Busman on 07/17/13   Symptoms  Reason For Call & Symptoms: [place] states has burning with urination onset 07/16/13. Has urinary frequency but only able to void a few drops. Thought she may have a vaginal yeast  infection due to brown vaginal  discharge onset 07/15/13 x 2 days. Used a douche. No vaginal or perineal itching.  Per urinary pain protocol has see today in office due to unusual vaginal discharge. No appointments available on 07/17/13 for Dr. Amador Cunas. PLEASE CALL [place] AT [phone] regarding appointment.  Reviewed Health History In EMR: Yes  Reviewed Medications In EMR: Yes  Reviewed Allergies In EMR: Yes  Reviewed Surgeries / Procedures: Yes  Date of Onset of Symptoms: 07/17/2013  Treatments Tried: Yogurt, Used a douche  Treatments Tried Worked: No  Guideline(s) Used:  Urination Pain - Female  Disposition Per Guideline:   See Today in Office  Reason For Disposition Reached:   Unusual vaginal discharge  Advice Given:  N/A  Patient Will Follow Care Advice:  YES

## 2013-07-18 ENCOUNTER — Encounter: Payer: Self-pay | Admitting: Family

## 2013-07-18 ENCOUNTER — Ambulatory Visit (INDEPENDENT_AMBULATORY_CARE_PROVIDER_SITE_OTHER): Payer: Self-pay | Admitting: Family

## 2013-07-18 VITALS — BP 130/58 | HR 102 | Wt 157.0 lb

## 2013-07-18 DIAGNOSIS — N390 Urinary tract infection, site not specified: Secondary | ICD-10-CM

## 2013-07-18 DIAGNOSIS — R300 Dysuria: Secondary | ICD-10-CM

## 2013-07-18 LAB — POCT URINALYSIS DIPSTICK
Bilirubin, UA: NEGATIVE
Glucose, UA: NEGATIVE
Nitrite, UA: POSITIVE

## 2013-07-18 MED ORDER — CIPROFLOXACIN HCL 250 MG PO TABS: 250.0000 mg | ORAL_TABLET | Freq: Two times a day (BID) | ORAL | Status: DC

## 2013-07-18 NOTE — Progress Notes (Signed)
Subjective:    Patient ID: [name], female    DOB: 03-29-1941, 72 y.o.   MRN: [id number]  HPI 72 year old female, patient of Dr. Kirtland Bouchard, is in today with c/o burning with urination x 2 days. After drinking water, symptoms improved. Discharge throat are. No abnormal pain or back pain.   Review of Systems  Constitutional: Negative.   Respiratory: Negative.   Cardiovascular: Negative.   Gastrointestinal: Negative.   Endocrine: Negative.   Genitourinary: Positive for dysuria. Negative for vaginal discharge.  Psychiatric/Behavioral: Negative.    Past Medical History  Diagnosis Date  . ANEMIA 08/28/2010  . 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 COLONIC POLYPS, HX OF 06/15/2007  . 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 CORONARY ARTERY DISEASE 06/15/2007  . DEPRESSIVE DISORDER 06/06/2008  . GERD 12/19/2009  . HYPERLIPIDEMIA 06/15/2007  . HYPERTENSION 06/15/2007  . HYPOTENSION 08/11/2010  . OSTEOARTHRITIS 06/15/2007  . PEDAL EDEMA 06/06/2009  . PERIPHERAL VASCULAR DISEASE 06/15/2007  . Lumbar stenosis   . Lung abscess 2011  . CHF (congestive heart failure)   . Hx of colonoscopy     History   Social History  . Marital Status: Widowed    Spouse Name: N/A    Number of Children: 2  . Years of Education: N/A   Occupational History  .     Social History Main Topics  . Smoking status: Former Smoker -- 1.50 packs/day for 40 years    Types: Cigarettes    Quit date: 06/29/2000  . Smokeless tobacco: Never Used  . Alcohol Use: No  . Drug Use: No  . Sexual Activity: Not on file   Other Topics Concern  . Not on file   Social History Narrative  . No narrative on file    Past Surgical History  Procedure Laterality Date  . L subclavian bypass  07/2004  . Tubal ligation    . Coronary artery bypass graft  05/2000  . Femoral popliteal bypass-r  04/1999  . Right common iliac pta with stent placement  07/2000  . Right leg blockage  2003  . Aorta bifemoral bypass graft  2004  . Left cea  2005  . Lung  cyst biopsy  2011    Family History  Problem Relation Age of Onset  . Diabetes Father   . Heart attack Mother   . Diabetes    . Coronary artery disease    . Colon cancer Neg Hx     Allergies  Allergen Reactions  . Codeine Phosphate     REACTION: unspecified    Current Outpatient Prescriptions on File Prior to Visit  Medication Sig Dispense Refill  . acetaminophen (TYLENOL) 325 MG tablet Take 650 mg by mouth every 6 (six) hours as needed.        Marland Kitchen aspirin 81 MG tablet Take 81 mg by mouth daily.      . Chlorpheniramine-Acetaminophen (CORICIDIN HBP COLD/FLU PO) Take 1 tablet by mouth daily as needed.      . cilostazol (PLETAL) 100 MG tablet Take 100 mg by mouth daily.      . felodipine (PLENDIL) 5 MG 24 hr tablet Take 1 tablet (5 mg total) by mouth daily.  90 tablet  6  . gabapentin (NEURONTIN) 600 MG tablet Take 1 tablet (600 mg total) by mouth 3 (three) times daily.  180 tablet  4  . hydrochlorothiazide (HYDRODIURIL) 25 MG tablet Take 1 tablet (25 mg total) by mouth daily.  90 tablet  3  .  HYDROcodone-acetaminophen (NORCO/VICODIN) 5-325 MG per tablet TAKE 1/2 TABLET BY MOUTH 6 TIMES A DAY AS NEEDED  90 tablet  0  . isosorbide mononitrate (IMDUR) 30 MG 24 hr tablet Take 1 tablet (30 mg total) by mouth daily.  90 tablet  3  . LORazepam (ATIVAN) 0.5 MG tablet TAKE 1 TABLET BY MOUTH TWICE A DAY  60 tablet  2  . losartan (COZAAR) 25 MG tablet Take 1 tablet (25 mg total) by mouth daily.  30 tablet  6  . nitroGLYCERIN (NITROSTAT) 0.4 MG SL tablet Place 0.4 mg under the tongue every 5 (five) minutes as needed.      Marland Kitchen omeprazole (PRILOSEC) 20 MG capsule Take 1 capsule (20 mg total) by mouth 2 (two) times daily.  90 capsule  5  . promethazine (PHENERGAN) 25 MG tablet TAKE ONE TABLET BY MOUTH EVERY 6 HOURS FOR NAUSEA  20 tablet  0  . sertraline (ZOLOFT) 50 MG tablet Take 1 tablet (50 mg total) by mouth daily.  90 tablet  5  . simvastatin (ZOCOR) 80 MG tablet Take 1 tablet (80 mg total) by  mouth at bedtime.  90 tablet  3  . tiotropium (SPIRIVA HANDIHALER) 18 MCG inhalation capsule Place 1 capsule (18 mcg total) into inhaler and inhale daily.  30 capsule  11   No current facility-administered medications on file prior to visit.    BP 130/58  Pulse 102  Wt 157 lb (71.215 kg)  BMI 28.24 kg/m2chart    Objective:   Physical Exam  Constitutional: She is oriented to person, place, and time. She appears well-developed and well-nourished.  HENT:  Right Ear: External ear normal.  Left Ear: External ear normal.  Nose: Nose normal.  Mouth/Throat: Oropharynx is clear and moist.  Neck: Normal range of motion. Neck supple.  Cardiovascular: Normal rate, regular rhythm and normal heart sounds.   Pulmonary/Chest: Effort normal and breath sounds normal.  Abdominal: Soft. Bowel sounds are normal.  Musculoskeletal: Normal range of motion.  Neurological: She is alert and oriented to person, place, and time.  Skin: Skin is warm and dry.  Psychiatric: She has a normal mood and affect.          Assessment & Plan:  Assessment: 1. UTI 2. Dysuria  Plan: Cipro 200 mg one tablet twice a day x5 days. Drink plenty of water. Avoid caffeine. Call the office if symptoms worsen or persist. Recheck as scheduled, and as needed.

## 2013-07-18 NOTE — Patient Instructions (Addendum)
Urinary Tract Infection  Urinary tract infections (UTIs) can develop anywhere along your urinary tract. Your urinary tract is your body's drainage system for removing wastes and extra water. Your urinary tract includes two kidneys, two ureters, a bladder, and a urethra. Your kidneys are a pair of bean-shaped organs. Each kidney is about the size of your fist. They are located below your ribs, one on each side of your spine.  CAUSES  Infections are caused by microbes, which are microscopic organisms, including fungi, viruses, and bacteria. These organisms are so small that they can only be seen through a microscope. Bacteria are the microbes that most commonly cause UTIs.  SYMPTOMS   Symptoms of UTIs may vary by age and gender of the patient and by the location of the infection. Symptoms in young women typically include a frequent and intense urge to urinate and a painful, burning feeling in the bladder or urethra during urination. Older women and men are more likely to be tired, shaky, and weak and have muscle aches and abdominal pain. A fever may mean the infection is in your kidneys. Other symptoms of a kidney infection include pain in your back or sides below the ribs, nausea, and vomiting.  DIAGNOSIS  To diagnose a UTI, your caregiver will ask you about your symptoms. Your caregiver also will ask to provide a urine sample. The urine sample will be tested for bacteria and white blood cells. White blood cells are made by your body to help fight infection.  TREATMENT   Typically, UTIs can be treated with medication. Because most UTIs are caused by a bacterial infection, they usually can be treated with the use of antibiotics. The choice of antibiotic and length of treatment depend on your symptoms and the type of bacteria causing your infection.  HOME CARE INSTRUCTIONS   If you were prescribed antibiotics, take them exactly as your caregiver instructs you. Finish the medication even if you feel better after you  have only taken some of the medication.   Drink enough water and fluids to keep your urine clear or pale yellow.   Avoid caffeine, tea, and carbonated beverages. They tend to irritate your bladder.   Empty your bladder often. Avoid holding urine for long periods of time.   Empty your bladder before and after sexual intercourse.   After a bowel movement, women should cleanse from front to back. Use each tissue only once.  SEEK MEDICAL CARE IF:    You have back pain.   You develop a fever.   Your symptoms do not begin to resolve within 3 days.  SEEK IMMEDIATE MEDICAL CARE IF:    You have severe back pain or lower abdominal pain.   You develop chills.   You have nausea or vomiting.   You have continued burning or discomfort with urination.  MAKE SURE YOU:    Understand these instructions.   Will watch your condition.   Will get help right away if you are not doing well or get worse.  Document Released: 08/25/2005 Document Revised: 05/16/2012 Document Reviewed: 12/24/2011  [employer] Patient Information 2014 [employer]

## 2013-07-21 ENCOUNTER — Other Ambulatory Visit: Payer: Self-pay | Admitting: Internal Medicine

## 2013-07-21 LAB — URINE CULTURE: Colony Count: >=100000

## 2013-07-23 ENCOUNTER — Other Ambulatory Visit: Payer: Self-pay | Admitting: *Deleted

## 2013-07-23 MED ORDER — GABAPENTIN 600 MG PO TABS: 600.0000 mg | ORAL_TABLET | Freq: Three times a day (TID) | ORAL | Status: DC

## 2013-07-23 MED ORDER — LOSARTAN POTASSIUM 25 MG PO TABS: 25.0000 mg | ORAL_TABLET | Freq: Every day | ORAL | Status: DC

## 2013-07-27 ENCOUNTER — Encounter: Payer: Self-pay | Admitting: Critical Care Medicine

## 2013-07-27 ENCOUNTER — Ambulatory Visit (INDEPENDENT_AMBULATORY_CARE_PROVIDER_SITE_OTHER): Payer: Medicare Other | Admitting: Critical Care Medicine

## 2013-07-27 VITALS — BP 140/76 | HR 71 | Temp 97.6°F | Ht 62.5 in | Wt 157.4 lb

## 2013-07-27 DIAGNOSIS — Z23 Encounter for immunization: Secondary | ICD-10-CM

## 2013-07-27 DIAGNOSIS — J449 Chronic obstructive pulmonary disease, unspecified: Secondary | ICD-10-CM

## 2013-07-27 NOTE — Patient Instructions (Signed)
Flu vaccine today Refills on spiriva sent Return 6 months

## 2013-07-28 NOTE — Assessment & Plan Note (Signed)
Gold B Copd improved Plan  cont inhaled meds

## 2013-07-28 NOTE — Progress Notes (Signed)
Subjective:    Patient ID: [name], female    DOB: 31-May-1941, 72 y.o.   MRN: [id number]  HPI  72 y.o.  WF with RML lung abscess no other risk factors and neg FOB for endobronchial lesion 9/11. Mild COPD ex smoker 60pack years. Normal FeV1, Fef25-75 39% predicted   03/28/2013 Since last ov: last two weeks, URI syndrome.  Pt saw PCP, assumed viral and rx OTC.  No chest pain.  Notes some mucus, white to yellow.   Emesis with coughing.  Dyspnea with exertion.  No change with dyspnea.   8/29 No new issues. Dyspnea is at baseline Pt denies any significant sore throat, nasal congestion or excess secretions, fever, chills, sweats, unintended weight loss, pleurtic or exertional chest pain, orthopnea PND, or leg swelling Pt denies any increase in rescue therapy over baseline, denies waking up needing it or having any early am or nocturnal exacerbations of coughing/wheezing/or dyspnea. Pt also denies any obvious fluctuation in symptoms with  weather or environmental change or other alleviating or aggravating factors     Past Medical History  Diagnosis Date  . ANEMIA 08/28/2010  . 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 COLONIC POLYPS, HX OF 06/15/2007  . 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 CORONARY ARTERY DISEASE 06/15/2007  . DEPRESSIVE DISORDER 06/06/2008  . GERD 12/19/2009  . HYPERLIPIDEMIA 06/15/2007  . HYPERTENSION 06/15/2007  . HYPOTENSION 08/11/2010  . OSTEOARTHRITIS 06/15/2007  . PEDAL EDEMA 06/06/2009  . PERIPHERAL VASCULAR DISEASE 06/15/2007  . Lumbar stenosis   . Lung abscess 2011  . CHF (congestive heart failure)   . Hx of colonoscopy      Family History  Problem Relation Age of Onset  . Diabetes Father   . Heart attack Mother   . Diabetes    . Coronary artery disease    . Colon cancer Neg Hx      History   Social History  . Marital Status: Widowed    Spouse Name: N/A    Number of Children: 2  . Years of Education: N/A   Occupational History  .     Social History Main  Topics  . Smoking status: Former Smoker -- 1.50 packs/day for 40 years    Types: Cigarettes    Quit date: 06/29/2000  . Smokeless tobacco: Never Used  . Alcohol Use: No  . Drug Use: No  . Sexual Activity: Not on file   Other Topics Concern  . Not on file   Social History Narrative  . No narrative on file     Allergies  Allergen Reactions  . Codeine Phosphate     REACTION: unspecified     Outpatient Prescriptions Prior to Visit  Medication Sig Dispense Refill  . acetaminophen (TYLENOL) 325 MG tablet Take 650 mg by mouth every 6 (six) hours as needed.        Marland Kitchen aspirin 81 MG tablet Take 81 mg by mouth daily.      . Chlorpheniramine-Acetaminophen (CORICIDIN HBP COLD/FLU PO) Take 1 tablet by mouth daily as needed.      . cilostazol (PLETAL) 100 MG tablet Take 100 mg by mouth daily.      . felodipine (PLENDIL) 5 MG 24 hr tablet Take 1 tablet (5 mg total) by mouth daily.  90 tablet  6  . gabapentin (NEURONTIN) 600 MG tablet Take 1 tablet (600 mg total) by mouth 3 (three) times daily.  270 tablet  3  . HYDROcodone-acetaminophen (NORCO/VICODIN) 5-325 MG per tablet TAKE  1/2 A TABLET BY MOUTH 6 TIMES A DAY AS NEEDED  90 tablet  0  . isosorbide mononitrate (IMDUR) 30 MG 24 hr tablet Take 1 tablet (30 mg total) by mouth daily.  90 tablet  3  . LORazepam (ATIVAN) 0.5 MG tablet TAKE 1 TABLET BY MOUTH TWICE A DAY  60 tablet  2  . losartan (COZAAR) 25 MG tablet Take 1 tablet (25 mg total) by mouth daily.  90 tablet  1  . nitroGLYCERIN (NITROSTAT) 0.4 MG SL tablet Place 0.4 mg under the tongue every 5 (five) minutes as needed.      Marland Kitchen omeprazole (PRILOSEC) 20 MG capsule Take 1 capsule (20 mg total) by mouth 2 (two) times daily.  90 capsule  5  . promethazine (PHENERGAN) 25 MG tablet TAKE ONE TABLET BY MOUTH EVERY 6 HOURS FOR NAUSEA  20 tablet  0  . sertraline (ZOLOFT) 50 MG tablet Take 1 tablet (50 mg total) by mouth daily.  90 tablet  5  . simvastatin (ZOCOR) 80 MG tablet Take 1 tablet (80 mg  total) by mouth at bedtime.  90 tablet  3  . tiotropium (SPIRIVA HANDIHALER) 18 MCG inhalation capsule Place 1 capsule (18 mcg total) into inhaler and inhale daily.  30 capsule  11  . ciprofloxacin (CIPRO) 250 MG tablet Take 1 tablet (250 mg total) by mouth 2 (two) times daily.  10 tablet  0  . hydrochlorothiazide (HYDRODIURIL) 25 MG tablet Take 1 tablet (25 mg total) by mouth daily.  90 tablet  3   No facility-administered medications prior to visit.      Review of Systems  Constitutional:   No  weight loss, night sweats,  Fevers, chills,  +fatigue, lassitude. HEENT:   No headaches,  Difficulty swallowing,  Tooth/dental problems,  Sore throat,                No sneezing, itching, ear ache, nasal congestion, post nasal drip,   CV:  No chest pain,  Orthopnea, PND, swelling in lower extremities, anasarca, dizziness, palpitations  GI  Notes  heartburn, notes indigestion, abdominal pain, nausea, vomiting, diarrhea, change in bowel habits, loss of appetite  Resp:    No coughing up of blood.  Notes change in color of mucus.  No wheezing.  No chest wall deformity  Skin: no rash or lesions.  GU: no dysuria, change in color of urine, no urgency or frequency.  No flank pain.  MS:  No joint pain or swelling.  No decreased range of motion.     Psych:  No change in mood or affect. No depression or anxiety.  No memory loss.     Objective:   Physical Exam  Filed Vitals:   07/27/13 0908  BP: 140/76  Pulse: 71  Temp: 97.6 F (36.4 C)  TempSrc: Oral  Height: 5' 2.5" (1.588 m)  Weight: 157 lb 6.4 oz (71.396 kg)  SpO2: 96%    Gen: Pleasant,  in no distress,  normal affect  ENT: No lesions,  mouth clear,  oropharynx clear, no postnasal drip  Neck: No JVD, no TMG, no carotid bruits  Lungs: No use of accessory muscles, no dullness to percussion, Coarse BS   Cardiovascular: RRR, heart sounds normal, no murmur or gallops, no peripheral edema  Abdomen: soft and NT, no HSM,  BS  normal  Musculoskeletal: No deformities, no cyanosis or clubbing  Neuro: alert, non focal  Skin: Warm, no lesions or rashes     No  results found.  Assessment & Plan:   COPD Gold B Gold B Copd improved Plan  cont inhaled meds    Updated Medication List Outpatient Encounter Prescriptions as of 07/27/2013  Medication Sig Dispense Refill  . acetaminophen (TYLENOL) 325 MG tablet Take 650 mg by mouth every 6 (six) hours as needed.        Marland Kitchen aspirin 81 MG tablet Take 81 mg by mouth daily.      . Chlorpheniramine-Acetaminophen (CORICIDIN HBP COLD/FLU PO) Take 1 tablet by mouth daily as needed.      . chlorthalidone (HYGROTON) 25 MG tablet Take 25 mg by mouth daily.      . cilostazol (PLETAL) 100 MG tablet Take 100 mg by mouth daily.      . felodipine (PLENDIL) 5 MG 24 hr tablet Take 1 tablet (5 mg total) by mouth daily.  90 tablet  6  . gabapentin (NEURONTIN) 600 MG tablet Take 1 tablet (600 mg total) by mouth 3 (three) times daily.  270 tablet  3  . HYDROcodone-acetaminophen (NORCO/VICODIN) 5-325 MG per tablet TAKE 1/2 A TABLET BY MOUTH 6 TIMES A DAY AS NEEDED  90 tablet  0  . isosorbide mononitrate (IMDUR) 30 MG 24 hr tablet Take 1 tablet (30 mg total) by mouth daily.  90 tablet  3  . LORazepam (ATIVAN) 0.5 MG tablet TAKE 1 TABLET BY MOUTH TWICE A DAY  60 tablet  2  . losartan (COZAAR) 25 MG tablet Take 1 tablet (25 mg total) by mouth daily.  90 tablet  1  . nitroGLYCERIN (NITROSTAT) 0.4 MG SL tablet Place 0.4 mg under the tongue every 5 (five) minutes as needed.      Marland Kitchen omeprazole (PRILOSEC) 20 MG capsule Take 1 capsule (20 mg total) by mouth 2 (two) times daily.  90 capsule  5  . promethazine (PHENERGAN) 25 MG tablet TAKE ONE TABLET BY MOUTH EVERY 6 HOURS FOR NAUSEA  20 tablet  0  . sertraline (ZOLOFT) 50 MG tablet Take 1 tablet (50 mg total) by mouth daily.  90 tablet  5  . simvastatin (ZOCOR) 80 MG tablet Take 1 tablet (80 mg total) by mouth at bedtime.  90 tablet  3  . tiotropium  (SPIRIVA HANDIHALER) 18 MCG inhalation capsule Place 1 capsule (18 mcg total) into inhaler and inhale daily.  30 capsule  11  . albuterol (PROVENTIL HFA;VENTOLIN HFA) 108 (90 BASE) MCG/ACT inhaler Inhale 2 puffs into the lungs every 6 (six) hours as needed for wheezing or shortness of breath.      . [DISCONTINUED] ciprofloxacin (CIPRO) 250 MG tablet Take 1 tablet (250 mg total) by mouth 2 (two) times daily.  10 tablet  0  . [DISCONTINUED] hydrochlorothiazide (HYDRODIURIL) 25 MG tablet Take 1 tablet (25 mg total) by mouth daily.  90 tablet  3   No facility-administered encounter medications on file as of 07/27/2013.

## 2013-08-29 ENCOUNTER — Other Ambulatory Visit: Payer: Self-pay | Admitting: Internal Medicine

## 2013-09-03 ENCOUNTER — Other Ambulatory Visit: Payer: Self-pay | Admitting: Internal Medicine

## 2013-09-04 ENCOUNTER — Telehealth: Payer: Self-pay | Admitting: Critical Care Medicine

## 2013-09-04 DIAGNOSIS — J449 Chronic obstructive pulmonary disease, unspecified: Secondary | ICD-10-CM

## 2013-09-04 MED ORDER — TIOTROPIUM BROMIDE MONOHYDRATE 18 MCG IN CAPS: 18.0000 ug | ORAL_CAPSULE | Freq: Every day | RESPIRATORY_TRACT | Status: DC

## 2013-09-04 NOTE — Telephone Encounter (Signed)
I spoke with pt and is aware 1 sample of spiriva placed upfront for pick up in [place]. Nothing further needed

## 2013-09-11 ENCOUNTER — Telehealth: Payer: Self-pay | Admitting: Internal Medicine

## 2013-09-11 MED ORDER — HYDROCODONE-ACETAMINOPHEN 5-325 MG PO TABS: ORAL_TABLET | ORAL | Status: DC

## 2013-09-11 NOTE — Telephone Encounter (Signed)
Pt is calling to request a refill of her HYDROcodone-acetaminophen (NORCO/VICODIN) 5-325 MG per tablet. Please assist.

## 2013-09-11 NOTE — Telephone Encounter (Signed)
Left detailed message Rx ready for pick up. Rx printed and signed put at front desk for pickup. 

## 2013-09-19 ENCOUNTER — Encounter: Payer: Medicare Other | Admitting: Internal Medicine

## 2013-09-19 ENCOUNTER — Other Ambulatory Visit: Payer: Self-pay | Admitting: Internal Medicine

## 2013-09-21 ENCOUNTER — Telehealth: Payer: Self-pay | Admitting: Internal Medicine

## 2013-09-21 ENCOUNTER — Telehealth: Payer: Self-pay | Admitting: Critical Care Medicine

## 2013-09-21 DIAGNOSIS — J449 Chronic obstructive pulmonary disease, unspecified: Secondary | ICD-10-CM

## 2013-09-21 DIAGNOSIS — J4489 Other specified chronic obstructive pulmonary disease: Secondary | ICD-10-CM

## 2013-09-21 MED ORDER — TIOTROPIUM BROMIDE MONOHYDRATE 18 MCG IN CAPS: 18.0000 ug | ORAL_CAPSULE | Freq: Every day | RESPIRATORY_TRACT | Status: DC

## 2013-09-21 NOTE — Telephone Encounter (Signed)
Left detailed message do have sample of Spriva capsules will put at front desk for pickup.

## 2013-09-21 NOTE — Telephone Encounter (Signed)
Called and spoke with pt and she is aware of samples of the spiriva that have been left up front.  Nothing further is needed.

## 2013-09-21 NOTE — Telephone Encounter (Signed)
Pt needs samples of spriva

## 2013-10-02 ENCOUNTER — Ambulatory Visit (INDEPENDENT_AMBULATORY_CARE_PROVIDER_SITE_OTHER): Payer: Medicare Other | Admitting: Internal Medicine

## 2013-10-02 ENCOUNTER — Encounter: Payer: Self-pay | Admitting: Internal Medicine

## 2013-10-02 VITALS — BP 140/80 | HR 86 | Temp 97.7°F | Resp 20 | Ht 62.5 in | Wt 153.0 lb

## 2013-10-02 DIAGNOSIS — J449 Chronic obstructive pulmonary disease, unspecified: Secondary | ICD-10-CM

## 2013-10-02 DIAGNOSIS — E785 Hyperlipidemia, unspecified: Secondary | ICD-10-CM

## 2013-10-02 DIAGNOSIS — D649 Anemia, unspecified: Secondary | ICD-10-CM

## 2013-10-02 DIAGNOSIS — I2510 Atherosclerotic heart disease of native coronary artery without angina pectoris: Secondary | ICD-10-CM

## 2013-10-02 DIAGNOSIS — M1990 Unspecified osteoarthritis, unspecified site: Secondary | ICD-10-CM

## 2013-10-02 DIAGNOSIS — I350 Nonrheumatic aortic (valve) stenosis: Secondary | ICD-10-CM

## 2013-10-02 DIAGNOSIS — I359 Nonrheumatic aortic valve disorder, unspecified: Secondary | ICD-10-CM

## 2013-10-02 DIAGNOSIS — Z0000 Encounter for general adult medical examination without abnormal findings: Secondary | ICD-10-CM

## 2013-10-02 DIAGNOSIS — I10 Essential (primary) hypertension: Secondary | ICD-10-CM

## 2013-10-02 DIAGNOSIS — I679 Cerebrovascular disease, unspecified: Secondary | ICD-10-CM

## 2013-10-02 DIAGNOSIS — I25810 Atherosclerosis of coronary artery bypass graft(s) without angina pectoris: Secondary | ICD-10-CM

## 2013-10-02 LAB — COMPREHENSIVE METABOLIC PANEL
Albumin: 4 g/dL (ref 3.5–5.2)
Alkaline Phosphatase: 58 U/L (ref 39–117)
BUN: 14 mg/dL (ref 6–23)
Chloride: 101 mEq/L (ref 96–112)
GFR: 68.03 mL/min (ref >60.00)
Glucose, Bld: 105 mg/dL — ABNORMAL HIGH (ref 70–99)
Total Bilirubin: 0.5 mg/dL (ref 0.3–1.2)

## 2013-10-02 LAB — CBC WITH DIFFERENTIAL/PLATELET
Basophils Relative: 0.2 % (ref 0.0–3.0)
Eosinophils Relative: 2.1 % (ref 0.0–5.0)
Hemoglobin: 13.1 g/dL (ref 12.0–15.0)
Lymphocytes Relative: 21.8 % (ref 12.0–46.0)
Monocytes Relative: 7.2 % (ref 3.0–12.0)
Neutro Abs: 6.2 10*3/uL (ref 1.4–7.7)
RBC: 4.34 Mil/uL (ref 3.87–5.11)
WBC: 9 10*3/uL (ref 4.5–10.5)

## 2013-10-02 LAB — LIPID PANEL
Cholesterol: 155 mg/dL (ref 0–200)
HDL: 54.4 mg/dL (ref >39.00)
Triglycerides: 215 mg/dL — ABNORMAL HIGH (ref 0.0–149.0)
VLDL: 43 mg/dL — ABNORMAL HIGH (ref 0.0–40.0)

## 2013-10-02 LAB — TSH: TSH: 1.29 u[IU]/mL (ref 0.35–5.50)

## 2013-10-02 NOTE — Progress Notes (Signed)
  Subjective:    Patient ID: [name], female    DOB: 08-13-1941, 72 y.o.   MRN: [id number]  HPI    Review of Systems     Objective:   Physical Exam        Assessment & Plan:

## 2013-10-02 NOTE — Progress Notes (Signed)
Patient ID: [name], female   DOB: 07/17/1941, 72 y.o.   MRN: [id number]  Subjective:    Patient ID: [name], female    DOB: 17-Dec-1940, 72 y.o.   MRN: [id number]  HPI Pre-visit discussion using our clinic review tool. No additional management support is needed unless otherwise documented below in the visit note.   72  year-old patient who is seen today for a wellness exam. She is followed by cardiology for coronary artery disease and also has a history of extensive peripheral vascular disease. She is followed also by pulmonary for  Mild COPD and a prior history of a lung abscess. She did have followup colonoscopy  2011. She continues to work and has 2 part-time jobs. She does have arthritis. Medical problems include dyslipidemia treated hypertension gastroesophageal reflux disease and a history of anxiety disorder. She has carotid artery disease and had a carotid artery Doppler study performed in May of 2014  Past History:  Past Medical History:  CORONARY ARTERY DISEASE (ICD-414.00)  PERIPHERAL VASCULAR DISEASE (ICD-443.9)  HYPERTENSION (ICD-401.9)  HYPERLIPIDEMIA (ICD-272.4)  DEPRESSIVE DISORDER (ICD-311)  HEALTH SCREENING (ICD-V70.0)  OSTEOARTHRITIS (ICD-715.90)  COLONIC POLYPS, HX OF (ICD-V12.72)  lumbar spinal stenosis  Aortic stenosis-mild  Past Surgical History:  L subclavian bypass September 2005  Tubal ligation  Coronary artery bypass graft July 2001  Femoral popliteal bypass R.-June 2000  status post right common iliac PTA with stent placement, September 2001  status post redo right femoral until bypass February 2002; complicated by early thrombosis, requiring thrombectomy  aorto bifemoral bypass November 2004  colonoscopy October 2006  2011  breat bx (2) 2010  Dexa 12/09  carotid duplex- 6-09  Lumbar MRI 8-10  ABI 4-10   Family History:   father died age 34. History diabetes, cerebrovascular disease  mother died at age 60, status post hip surgery  of an MI  5 brothers, one sister- one brother deceased -DM, CAD, SDAT  Positive diabetes, and coronary artery disease   Social History:   Former Smoker  Divorced  1. Risk factors, based on past  M,S,F history- patient has known cardiovascular disease and peripheral vascular disease status post bypass. Cardiovascular risk factors include hypertension dyslipidemia and a history of tobacco use  2.  Physical activities: No activity restrictions  3.  Depression/mood: No history depression or mood disorder  4.  Hearing: No deficits  5.  ADL's: Independent in all aspects of daily living  6.  Fall risk: Low  7.  Home safety: No problems identified 8.  Height weight, and visual acuity; height and weight stable no change in visual acuity  9.  Counseling: Regular exercise heart healthy diet encouraged  10. Lab orders based on risk factors: Laboratory update including lipid panel be reviewed  11. Referral : Followup cardiology pulmonary and GI  12. Care plan: Continue present regimen  13. Cognitive assessment: Alert and oriented with normal affect. No cognitive dysfunction     Past Medical History  Diagnosis Date  . ANEMIA 08/28/2010  . 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 COLONIC POLYPS, HX OF 06/15/2007  . 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 CORONARY ARTERY DISEASE 06/15/2007  . DEPRESSIVE DISORDER 06/06/2008  . GERD 12/19/2009  . HYPERLIPIDEMIA 06/15/2007  . HYPERTENSION 06/15/2007  . HYPOTENSION 08/11/2010  . OSTEOARTHRITIS 06/15/2007  . PEDAL EDEMA 06/06/2009  . PERIPHERAL VASCULAR DISEASE 06/15/2007  . Lumbar stenosis   . Lung abscess 2011  . CHF (congestive heart failure)   . Hx  of colonoscopy    Past Surgical History  Procedure Laterality Date  . L subclavian bypass  07/2004  . Tubal ligation    . Coronary artery bypass graft  05/2000  . Femoral popliteal bypass-r  04/1999  . Right common iliac pta with stent placement  07/2000  . Right leg blockage  2003  . Aorta bifemoral bypass  graft  2004  . Left cea  2005  . Lung cyst biopsy  2011    reports that she quit smoking about 13 years ago. Her smoking use included Cigarettes. She has a 60 pack-year smoking history. She has never used smokeless tobacco. She reports that she does not drink alcohol or use illicit drugs. family history includes Coronary artery disease in an other family member; Diabetes in her father and another family member; Heart attack in her mother. There is no history of Colon cancer. Allergies  Allergen Reactions  . Codeine Phosphate     REACTION: unspecified      Review of Systems  Constitutional: Negative for fever, appetite change, fatigue and unexpected weight change.  HENT: Negative for congestion, dental problem, ear pain, hearing loss, mouth sores, nosebleeds, sinus pressure, sore throat, tinnitus, trouble swallowing and voice change.   Eyes: Negative for photophobia, pain, redness and visual disturbance.  Respiratory: Negative for cough, chest tightness and shortness of breath.   Cardiovascular: Negative for chest pain, palpitations and leg swelling.  Gastrointestinal: Negative for nausea, vomiting, abdominal pain, diarrhea, constipation, blood in stool, abdominal distention and rectal pain.  Genitourinary: Negative for dysuria, urgency, frequency, hematuria, flank pain, vaginal bleeding, vaginal discharge, difficulty urinating, genital sores, vaginal pain, menstrual problem and pelvic pain.  Musculoskeletal: Positive for arthralgias and back pain. Negative for neck stiffness.  Skin: Negative for rash.  Neurological: Negative for dizziness, syncope, speech difficulty, weakness, light-headedness, numbness and headaches.  Hematological: Negative for adenopathy. Does not bruise/bleed easily.  Psychiatric/Behavioral: Negative for suicidal ideas, behavioral problems, self-injury, dysphoric mood and agitation. The patient is not nervous/anxious.        Objective:   Physical Exam   Constitutional: She is oriented to person, place, and time. She appears well-developed and well-nourished.  Blood pressure 140/60 in both arms  HENT:  Head: Normocephalic and atraumatic.  Right Ear: External ear normal.  Left Ear: External ear normal.  Mouth/Throat: Oropharynx is clear and moist.  Eyes: Conjunctivae and EOM are normal.  Neck: Normal range of motion. Neck supple. No JVD present. No thyromegaly present.  Bilateral carotid and supraclavicular bruits  Cardiovascular: Normal rate and regular rhythm.   Murmur heard. The left dorsalis pedis pulse was intact the right dorsalis pedis pulse was diminished. Posterior tibial pulses were nonpalpable  Bilateral carotid and supraclavicular bruits were noted as well as bifemoral bruits  Grade 3/6 systolic murmur loudest at the primary aortic area    Status post sternotomy scar  Pulmonary/Chest: Effort normal and breath sounds normal. She has no wheezes. She has no rales.  Abdominal: Soft. Bowel sounds are normal. She exhibits no distension and no mass. There is no tenderness. There is no rebound and no guarding.  Musculoskeletal: Normal range of motion. She exhibits no edema and no tenderness.  Neurological: She is alert and oriented to person, place, and time. She has normal reflexes. No cranial nerve deficit. She exhibits normal muscle tone. Coordination normal.  Skin: Skin is warm and dry. No rash noted.  Psychiatric: She has a normal mood and affect. Her behavior is normal.  Assessment & Plan:   Preventive health examination Coronary artery disease status post CABG in 2001 Peripheral vascular disease Hypertension Dyslipidemia. LDL is at goal less than 70 Carotid artery disease Mild aortic stenosis   We'll continue present regimen. Followup cardiology and pulmonary as scheduled. We'll see him in 6 months or as needed

## 2013-10-02 NOTE — Patient Instructions (Signed)
Limit your sodium (Salt) intake    It is important that you exercise regularly, at least 20 minutes 3 to 4 times per week.  If you develop chest pain or shortness of breath seek  medical attention.  Return in 6 months for follow-up  

## 2013-10-09 ENCOUNTER — Other Ambulatory Visit: Payer: Self-pay | Admitting: Internal Medicine

## 2013-10-29 ENCOUNTER — Other Ambulatory Visit: Payer: Self-pay | Admitting: Internal Medicine

## 2013-11-19 ENCOUNTER — Telehealth: Payer: Self-pay | Admitting: Critical Care Medicine

## 2013-11-19 DIAGNOSIS — J449 Chronic obstructive pulmonary disease, unspecified: Secondary | ICD-10-CM

## 2013-11-19 MED ORDER — TIOTROPIUM BROMIDE MONOHYDRATE 18 MCG IN CAPS: 18.0000 ug | ORAL_CAPSULE | Freq: Every day | RESPIRATORY_TRACT | Status: DC

## 2013-11-19 NOTE — Telephone Encounter (Signed)
1 sample up front for p/u  [facility] to make the pt aware

## 2013-12-04 ENCOUNTER — Other Ambulatory Visit: Payer: Self-pay | Admitting: Internal Medicine

## 2013-12-07 ENCOUNTER — Telehealth: Payer: Self-pay | Admitting: Internal Medicine

## 2013-12-07 MED ORDER — HYDROCODONE-ACETAMINOPHEN 5-325 MG PO TABS: ORAL_TABLET | ORAL | Status: DC

## 2013-12-07 NOTE — Telephone Encounter (Signed)
Pt is requesting a refill of her HYDROcodone-acetaminophen (NORCO/VICODIN) 5-325 MG per tablet. Pt has 3 pills left. Please call when ready for pick up.

## 2013-12-07 NOTE — Telephone Encounter (Signed)
Called pt to inform her that rx is ready for pick up and it is at the front office.

## 2013-12-07 NOTE — Telephone Encounter (Signed)
Rx printed and signed, put at front desk. 

## 2013-12-10 ENCOUNTER — Encounter: Payer: Self-pay | Admitting: Internal Medicine

## 2013-12-10 ENCOUNTER — Other Ambulatory Visit: Payer: Self-pay | Admitting: Internal Medicine

## 2013-12-10 ENCOUNTER — Ambulatory Visit (INDEPENDENT_AMBULATORY_CARE_PROVIDER_SITE_OTHER): Payer: Medicare Other | Admitting: Internal Medicine

## 2013-12-10 VITALS — BP 120/80 | HR 69 | Temp 98.2°F | Resp 20 | Ht 62.5 in | Wt 157.0 lb

## 2013-12-10 DIAGNOSIS — E785 Hyperlipidemia, unspecified: Secondary | ICD-10-CM

## 2013-12-10 DIAGNOSIS — Z86010 Personal history of colonic polyps: Secondary | ICD-10-CM

## 2013-12-10 DIAGNOSIS — I739 Peripheral vascular disease, unspecified: Secondary | ICD-10-CM

## 2013-12-10 DIAGNOSIS — I679 Cerebrovascular disease, unspecified: Secondary | ICD-10-CM

## 2013-12-10 DIAGNOSIS — I2510 Atherosclerotic heart disease of native coronary artery without angina pectoris: Secondary | ICD-10-CM

## 2013-12-10 DIAGNOSIS — M1990 Unspecified osteoarthritis, unspecified site: Secondary | ICD-10-CM

## 2013-12-10 NOTE — Patient Instructions (Signed)
Limit your sodium (Salt) intake  Please check your blood pressure on a regular basis.  If it is consistently greater than 150/90, please make an office appointment.  Return in 6 months for follow-up   

## 2013-12-10 NOTE — Progress Notes (Signed)
Pre-visit discussion using our clinic review tool. No additional management support is needed unless otherwise documented below in the visit note.  

## 2013-12-10 NOTE — Progress Notes (Signed)
Subjective:    Patient ID: [name], female    DOB: 1940/12/23, 73 y.o.   MRN: [id number]  HPI  73 year old patient who presents with a chief complaint of numbness involving her right arm. There is no pain and denies any weakness. Numbness is paroxysmal and does not seem to involve the hand. Symptoms seem worse at night. No clear aggravation of symptoms with movement of the head or neck. Stable medical problems include coronary artery disease hypertension and dyslipidemia. She was seen for a full annual exam in November  Past Medical History  Diagnosis Date  . ANEMIA 08/28/2010  . 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 COLONIC POLYPS, HX OF 06/15/2007  . 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 CORONARY ARTERY DISEASE 06/15/2007  . DEPRESSIVE DISORDER 06/06/2008  . GERD 12/19/2009  . HYPERLIPIDEMIA 06/15/2007  . HYPERTENSION 06/15/2007  . HYPOTENSION 08/11/2010  . OSTEOARTHRITIS 06/15/2007  . PEDAL EDEMA 06/06/2009  . PERIPHERAL VASCULAR DISEASE 06/15/2007  . Lumbar stenosis   . Lung abscess 2011  . CHF (congestive heart failure)   . Hx of colonoscopy     History   Social History  . Marital Status: Widowed    Spouse Name: N/A    Number of Children: 2  . Years of Education: N/A   Occupational History  .     Social History Main Topics  . Smoking status: Former Smoker -- 1.50 packs/day for 40 years    Types: Cigarettes    Quit date: 06/29/2000  . Smokeless tobacco: Never Used  . Alcohol Use: No  . Drug Use: No  . Sexual Activity: Not on file   Other Topics Concern  . Not on file   Social History Narrative  . No narrative on file    Past Surgical History  Procedure Laterality Date  . L subclavian bypass  07/2004  . Tubal ligation    . Coronary artery bypass graft  05/2000  . Femoral popliteal bypass-r  04/1999  . Right common iliac pta with stent placement  07/2000  . Right leg blockage  2003  . Aorta bifemoral bypass graft  2004  . Left cea  2005  . Lung cyst biopsy  2011     Family History  Problem Relation Age of Onset  . Diabetes Father   . Heart attack Mother   . Diabetes    . Coronary artery disease    . Colon cancer Neg Hx     Allergies  Allergen Reactions  . Codeine Phosphate     REACTION: unspecified    Current Outpatient Prescriptions on File Prior to Visit  Medication Sig Dispense Refill  . acetaminophen (TYLENOL) 325 MG tablet Take 650 mg by mouth every 6 (six) hours as needed.        Marland Kitchen albuterol (PROVENTIL HFA;VENTOLIN HFA) 108 (90 BASE) MCG/ACT inhaler Inhale 2 puffs into the lungs every 6 (six) hours as needed for wheezing or shortness of breath.      Marland Kitchen aspirin 81 MG tablet Take 81 mg by mouth daily.      . Chlorpheniramine-Acetaminophen (CORICIDIN HBP COLD/FLU PO) Take 1 tablet by mouth daily as needed.      . cilostazol (PLETAL) 100 MG tablet TAKE 1 TABLET BY MOUTH 2 TIMES A DAY  180 tablet  1  . felodipine (PLENDIL) 5 MG 24 hr tablet TAKE 1 TABLET (5 MG TOTAL) BY MOUTH DAILY.  90 tablet  3  . gabapentin (NEURONTIN) 600 MG tablet Take 1  tablet (600 mg total) by mouth 3 (three) times daily.  270 tablet  3  . hydrochlorothiazide (HYDRODIURIL) 25 MG tablet       . HYDROcodone-acetaminophen (NORCO/VICODIN) 5-325 MG per tablet TAKE 1/2 A TABLET BY MOUTH 6 TIMES A DAY AS NEEDED  90 tablet  0  . isosorbide mononitrate (IMDUR) 30 MG 24 hr tablet Take 1 tablet (30 mg total) by mouth daily.  90 tablet  3  . LORazepam (ATIVAN) 0.5 MG tablet TAKE 1 TABLET BY MOUTH TWICE DAILY  60 tablet  2  . losartan (COZAAR) 25 MG tablet Take 1 tablet (25 mg total) by mouth daily.  90 tablet  1  . meloxicam (MOBIC) 7.5 MG tablet Take 7.5 mg by mouth daily.       . nitroGLYCERIN (NITROSTAT) 0.4 MG SL tablet Place 0.4 mg under the tongue every 5 (five) minutes as needed.      Marland Kitchen omeprazole (PRILOSEC) 20 MG capsule TAKE 1 CAPSULE (20 MG TOTAL) BY MOUTH 2 (TWO) TIMES DAILY.  90 capsule  3  . promethazine (PHENERGAN) 25 MG tablet TAKE ONE TABLET BY MOUTH EVERY 6  HOURS FOR NAUSEA  20 tablet  0  . sertraline (ZOLOFT) 50 MG tablet TAKE 1 TABLET (50 MG TOTAL) BY MOUTH DAILY.  90 tablet  1  . simvastatin (ZOCOR) 80 MG tablet Take 1 tablet (80 mg total) by mouth at bedtime.  90 tablet  3  . tiotropium (SPIRIVA HANDIHALER) 18 MCG inhalation capsule Place 1 capsule (18 mcg total) into inhaler and inhale daily.  10 capsule  0   No current facility-administered medications on file prior to visit.    BP 120/80  Pulse 69  Temp(Src) 98.2 F (36.8 C) (Oral)  Resp 20  Ht 5' 2.5" (1.588 m)  Wt 157 lb (71.215 kg)  BMI 28.24 kg/m2  SpO2 96%       Review of Systems  Constitutional: Negative.   HENT: Negative for congestion, dental problem, hearing loss, rhinorrhea, sinus pressure, sore throat and tinnitus.   Eyes: Negative for pain, discharge and visual disturbance.  Respiratory: Negative for cough and shortness of breath.   Cardiovascular: Negative for chest pain, palpitations and leg swelling.  Gastrointestinal: Negative for nausea, vomiting, abdominal pain, diarrhea, constipation, blood in stool and abdominal distention.  Genitourinary: Negative for dysuria, urgency, frequency, hematuria, flank pain, vaginal bleeding, vaginal discharge, difficulty urinating, vaginal pain and pelvic pain.  Musculoskeletal: Negative for arthralgias, gait problem and joint swelling.  Skin: Negative for rash.  Neurological: Positive for numbness. Negative for dizziness, syncope, speech difficulty, weakness and headaches.  Hematological: Negative for adenopathy.  Psychiatric/Behavioral: Negative for behavioral problems, dysphoric mood and agitation. The patient is not nervous/anxious.        Objective:   Physical Exam  Constitutional: She is oriented to person, place, and time. She appears well-developed and well-nourished.  HENT:  Head: Normocephalic.  Right Ear: External ear normal.  Left Ear: External ear normal.  Mouth/Throat: Oropharynx is clear and moist.   Eyes: Conjunctivae and EOM are normal. Pupils are equal, round, and reactive to light.  Neck: Normal range of motion. Neck supple. No thyromegaly present.  Cardiovascular: Normal rate, regular rhythm, normal heart sounds and intact distal pulses.   Pulmonary/Chest: Effort normal and breath sounds normal.  Abdominal: Soft. Bowel sounds are normal. She exhibits no mass. There is no tenderness.  Musculoskeletal: Normal range of motion.  Negative Tinel's  Lymphadenopathy:    She has no cervical adenopathy.  Neurological: She is alert and oriented to person, place, and time.  Reflexes very brisk and equal. Normal grip strength  Skin: Skin is warm and dry. No rash noted.  Psychiatric: She has a normal mood and affect. Her behavior is normal.          Assessment & Plan:   Intermittent numbness right arm. Possible cervical radiculopathy. Symptoms are infrequent paroxysmal and minor. Exam is unremarkable. We'll observe at this time Coronary artery disease Hypertension stable

## 2014-01-07 ENCOUNTER — Telehealth: Payer: Self-pay | Admitting: Critical Care Medicine

## 2014-01-07 NOTE — Telephone Encounter (Signed)
Samples have been placed up front for pick up. Pt is aware. 

## 2014-01-14 ENCOUNTER — Ambulatory Visit: Payer: Medicare Other | Admitting: Critical Care Medicine

## 2014-02-16 ENCOUNTER — Other Ambulatory Visit: Payer: Self-pay | Admitting: Critical Care Medicine

## 2014-02-19 ENCOUNTER — Ambulatory Visit (INDEPENDENT_AMBULATORY_CARE_PROVIDER_SITE_OTHER): Payer: Medicare Other | Admitting: Cardiology

## 2014-02-19 ENCOUNTER — Encounter: Payer: Self-pay | Admitting: Cardiology

## 2014-02-19 VITALS — BP 112/62 | Ht 62.5 in | Wt 153.8 lb

## 2014-02-19 DIAGNOSIS — I498 Other specified cardiac arrhythmias: Secondary | ICD-10-CM

## 2014-02-19 DIAGNOSIS — I25810 Atherosclerosis of coronary artery bypass graft(s) without angina pectoris: Secondary | ICD-10-CM

## 2014-02-19 DIAGNOSIS — I471 Supraventricular tachycardia: Secondary | ICD-10-CM | POA: Insufficient documentation

## 2014-02-19 LAB — CBC
HCT: 39.8 % (ref 36.0–46.0)
Hemoglobin: 12.9 g/dL (ref 12.0–15.0)
MCHC: 32.3 g/dL (ref 30.0–36.0)
MCV: 85.9 fl (ref 78.0–100.0)
Platelets: 242 10*3/uL (ref 150.0–400.0)
RBC: 4.64 Mil/uL (ref 3.87–5.11)
RDW: 15.3 % — ABNORMAL HIGH (ref 11.5–14.6)
WBC: 9.2 10*3/uL (ref 4.5–10.5)

## 2014-02-19 LAB — COMPREHENSIVE METABOLIC PANEL
ALT: 19 U/L (ref 0–35)
AST: 25 U/L (ref 0–37)
Albumin: 4.1 g/dL (ref 3.5–5.2)
Alkaline Phosphatase: 72 U/L (ref 39–117)
BUN: 15 mg/dL (ref 6–23)
CO2: 32 mEq/L (ref 19–32)
Calcium: 10.3 mg/dL (ref 8.4–10.5)
Chloride: 97 mEq/L (ref 96–112)
Creatinine, Ser: 0.9 mg/dL (ref 0.4–1.2)
GFR: 66.2 mL/min (ref >60.00)
Glucose, Bld: 102 mg/dL — ABNORMAL HIGH (ref 70–99)
Potassium: 3.2 mEq/L — ABNORMAL LOW (ref 3.5–5.1)
Sodium: 139 mEq/L (ref 135–145)
Total Bilirubin: 0.4 mg/dL (ref 0.3–1.2)
Total Protein: 7.5 g/dL (ref 6.0–8.3)

## 2014-02-19 LAB — TSH: TSH: 2.76 u[IU]/mL (ref 0.35–5.50)

## 2014-02-19 MED ORDER — RIVAROXABAN 20 MG PO TABS: 20.0000 mg | ORAL_TABLET | Freq: Every day | ORAL | Status: DC

## 2014-02-19 NOTE — Progress Notes (Signed)
Patient Name: [name] Date of Encounter: 02/19/2014  Primary Care Provider:  Nyoka Cowden, MD Primary Cardiologist:  Dorothy Spark (previously Dr Verl Blalock)  Problem List   Past Medical History  Diagnosis Date  . ANEMIA 08/28/2010  . 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 COLONIC POLYPS, HX OF 06/15/2007  . 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 CORONARY ARTERY DISEASE 06/15/2007  . DEPRESSIVE DISORDER 06/06/2008  . GERD 12/19/2009  . HYPERLIPIDEMIA 06/15/2007  . HYPERTENSION 06/15/2007  . HYPOTENSION 08/11/2010  . OSTEOARTHRITIS 06/15/2007  . PEDAL EDEMA 06/06/2009  . PERIPHERAL VASCULAR DISEASE 06/15/2007  . Lumbar stenosis   . Lung abscess 2011  . CHF (congestive heart failure)   . Hx of colonoscopy    Past Surgical History  Procedure Laterality Date  . L subclavian bypass  07/2004  . Tubal ligation    . Coronary artery bypass graft  05/2000  . Femoral popliteal bypass-r  04/1999  . Right common iliac pta with stent placement  07/2000  . Right leg blockage  2003  . Aorta bifemoral bypass graft  2004  . Left cea  2005  . Lung cyst biopsy  2011    Allergies  Allergies  Allergen Reactions  . Codeine Phosphate     REACTION: unspecified    HPI  Mrs. [name] returns today for evaluation and management of her coronary artery disease, history bypass surgery, and history of carotid disease.  The patient was previously followed by Dr. wall and has felt exceptionally well since bypass surgery in 2001. However she states that since last Thursday she felt little dizzy and weak. She also admits to exertional shortness of breath. She had occasionally and she asked her called for help and she is she felt dizzy and another episode like that over the weekend. She also mentions that for the last couple of weeks she has felt really tired and sleeps majority of time when she is at home after work. She denies chest pain, shortness of breath at rest, orthopnea paroxysmal nocturnal dyspnea or  lower extremity edema. She denies any syncope.  Home Medications  Prior to Admission medications   Medication Sig Start Date End Date Taking? Authorizing Provider  acetaminophen (TYLENOL) 325 MG tablet Take 650 mg by mouth every 6 (six) hours as needed.     Yes Historical Provider, MD  albuterol (PROVENTIL HFA;VENTOLIN HFA) 108 (90 BASE) MCG/ACT inhaler Inhale 2 puffs into the lungs every 6 (six) hours as needed for wheezing or shortness of breath.   Yes Historical Provider, MD  aspirin 81 MG tablet Take 81 mg by mouth daily.   Yes Historical Provider, MD  cilostazol (PLETAL) 100 MG tablet TAKE 1 TABLET BY MOUTH 2 TIMES A DAY 08/29/13  Yes Marletta Lor, MD  felodipine (PLENDIL) 5 MG 24 hr tablet TAKE 1 TABLET (5 MG TOTAL) BY MOUTH DAILY. 09/19/13  Yes Marletta Lor, MD  gabapentin (NEURONTIN) 600 MG tablet Take 1 tablet (600 mg total) by mouth 3 (three) times daily. 07/23/13  Yes Marletta Lor, MD  hydrochlorothiazide (HYDRODIURIL) 25 MG tablet  09/03/13  Yes Historical Provider, MD  HYDROcodone-acetaminophen (NORCO/VICODIN) 5-325 MG per tablet TAKE 1/2 A TABLET BY MOUTH 6 TIMES A DAY AS NEEDED 12/07/13  Yes Marletta Lor, MD  isosorbide mononitrate (IMDUR) 30 MG 24 hr tablet Take 1 tablet (30 mg total) by mouth daily. 09/18/12  Yes Marletta Lor, MD  LORazepam (ATIVAN) 0.5 MG tablet TAKE 1 TABLET  BY MOUTH TWICE DAILY 10/29/13  Yes Marletta Lor, MD  losartan (COZAAR) 25 MG tablet TAKE 1 TABLET (25 MG TOTAL) BY MOUTH DAILY.   Yes Elsie Stain, MD  meloxicam (MOBIC) 7.5 MG tablet Take 7.5 mg by mouth daily.  09/15/13  Yes Historical Provider, MD  nitroGLYCERIN (NITROSTAT) 0.4 MG SL tablet Place 0.4 mg under the tongue every 5 (five) minutes as needed. 08/13/11  Yes Renella Cunas, MD  omeprazole (PRILOSEC) 20 MG capsule TAKE 1 CAPSULE (20 MG TOTAL) BY MOUTH 2 (TWO) TIMES DAILY. 10/09/13  Yes Marletta Lor, MD  promethazine (PHENERGAN) 25 MG tablet TAKE ONE  TABLET BY MOUTH EVERY 6 HOURS FOR NAUSEA 07/03/13  Yes Marletta Lor, MD  sertraline (ZOLOFT) 50 MG tablet TAKE 1 TABLET (50 MG TOTAL) BY MOUTH DAILY. 12/04/13  Yes Marletta Lor, MD  simvastatin (ZOCOR) 80 MG tablet Take 1 tablet (80 mg total) by mouth at bedtime. 09/18/12  Yes Marletta Lor, MD  tiotropium (SPIRIVA HANDIHALER) 18 MCG inhalation capsule Place 1 capsule (18 mcg total) into inhaler and inhale daily. 11/19/13 11/19/14 Yes Tanda Rockers, MD    Family History  Family History  Problem Relation Age of Onset  . Diabetes Father   . Heart attack Mother   . Diabetes    . Coronary artery disease    . Colon cancer Neg Hx     Social History  History   Social History  . Marital Status: Widowed    Spouse Name: N/A    Number of Children: 2  . Years of Education: N/A   Occupational History  .     Social History Main Topics  . Smoking status: Former Smoker -- 1.50 packs/day for 40 years    Types: Cigarettes    Quit date: 06/29/2000  . Smokeless tobacco: Never Used  . Alcohol Use: No  . Drug Use: No  . Sexual Activity: Not on file   Other Topics Concern  . Not on file   Social History Narrative  . No narrative on file     Review of Systems, as per HPI, otherwise negative General:  No chills, fever, night sweats or weight changes.  Cardiovascular:  No chest pain, dyspnea on exertion, edema, orthopnea, palpitations, paroxysmal nocturnal dyspnea. Dermatological: No rash, lesions/masses Respiratory: No cough, dyspnea Urologic: No hematuria, dysuria Abdominal:   No nausea, vomiting, diarrhea, bright red blood per rectum, melena, or hematemesis Neurologic:  No visual changes, wkns, changes in mental status. All other systems reviewed and are otherwise negative except as noted above.  Physical Exam  Blood pressure 112/62, height 5' 2.5" (1.588 m), weight 153 lb 12.8 oz (69.763 kg).  General: Pleasant, NAD Psych: Normal affect. Neuro: Alert and  oriented X 3. Moves all extremities spontaneously. HEENT: Normal  Neck: Supple without bruits or JVD. Lungs:  Resp regular and unlabored, CTA. Heart: RRR no s3, s4, or murmurs. Abdomen: Soft, non-tender, non-distended, BS + x 4.  Extremities: No clubbing, cyanosis or edema. DP/PT/Radials 2+ and equal bilaterally.  Labs:  No results found for this basename: CKTOTAL, CKMB, TROPONINI,  in the last 72 hours Lab Results  Component Value Date   WBC 9.0 10/02/2013   HGB 13.1 10/02/2013   HCT 38.5 10/02/2013   MCV 88.5 10/02/2013   PLT 210.0 10/02/2013   No results found for this basename: NA, K, CL, CO2, BUN, CREATININE, CALCIUM, LABALBU, PROT, BILITOT, ALKPHOS, ALT, AST, GLUCOSE,  in the last 168  hours Lab Results  Component Value Date   CHOL 155 10/02/2013   HDL 54.40 10/02/2013   LDLCALC 64 09/18/2012   TRIG 215.0* 10/02/2013   No results found for this basename: DDIMER   No components found with this basename: POCBNP,   Accessory Clinical Findings  Echocardiogram - 01/2013 Study Conclusions  - Left ventricle: The cavity size was normal. Wall thickness was increased in a pattern of mild LVH. Systolic function was normal. The estimated ejection fraction was in the range of 55% to 60%. Wall motion was normal; there were no regional wall motion abnormalities. Doppler parameters are consistent with abnormal left ventricular relaxation (grade 1 diastolic dysfunction). - Aortic valve: There was mild stenosis. Mean gradient: 79mm Hg (S). Peak gradient: 64mm Hg (S). - Mitral valve: Calcified annulus. - Left atrium: The atrium was mildly dilated. - Pulmonary arteries: Systolic pressure was mildly increased. PA peak pressure: 81mm Hg (S). Impressions:  - Calcified aortic valve with mildly reduced cusp excursion; mild AS by doppler. There is note of elevated gradient from suprasternal notch most likely from right innominate artery (5 m/s); suggest dopplers to further assess.  ECG -  SVT, possible atrial flutter with 2 : 1 block.   Assessment & Plan  A pleasant 72 year old female with prior medical history of coronary artery disease, status post bypass surgery in 2001  1. new diagnosis of SVT, most probably atrial flutter with 2 :1 block, it was suggested to the patient that she would be admitted to the hospital today, however she needs to do so arrangements at home and would prefer to be admitted tomorrow. We will admit her to telemetry to be started on diltiazem and possible TEE cardioversion if ineffective. We will start her on Xarelto 20 mg today, she was given free samples in the clinic. Her last echocardiogram in March 2014 showed preserved LV function and mildly dilated left atrium. I won't give her any antiarrhythmics at this point if she is not anticoagulated. She is currently euvolemic and doesn't appear to be in heart failure.  2. CAD - there are ST depressions in inferolateral leads however these might just secondary to flutter waves. Considering her significant symptoms she will most probably need ischemic workup in the hospital.  3. Hypertension - controlled on current regimen  4. Hyperlipidemia - elevated triglycerides previously and fish oil. She was taken off the fish oil by her pulmonologist as he bili peak beats contributing to her cough.   Dorothy Spark, MD, [facility] 02/19/2014, 9:29 AM

## 2014-02-19 NOTE — Patient Instructions (Signed)
Your physician has recommended you make the following change in your medication:   1. Start Xarelto 20 mg 1 tablet daily. Samples # 10 given to you today.  You will get a call from the hospital tomorrow and they will let you know when to head over to the hospital.

## 2014-02-20 ENCOUNTER — Observation Stay (HOSPITAL_COMMUNITY)
Admission: RE | Admit: 2014-02-20 | Discharge: 2014-02-21 | Disposition: A | Discharge status: Home / Self Care | Payer: Medicare Other | Source: Ambulatory Visit | Attending: Cardiology | Admitting: Cardiology

## 2014-02-20 ENCOUNTER — Encounter (HOSPITAL_COMMUNITY): Payer: Self-pay | Admitting: General Practice

## 2014-02-20 DIAGNOSIS — Z9889 Other specified postprocedural states: Secondary | ICD-10-CM | POA: Insufficient documentation

## 2014-02-20 DIAGNOSIS — I25810 Atherosclerosis of coronary artery bypass graft(s) without angina pectoris: Secondary | ICD-10-CM | POA: Diagnosis present

## 2014-02-20 DIAGNOSIS — J4489 Other specified chronic obstructive pulmonary disease: Secondary | ICD-10-CM | POA: Insufficient documentation

## 2014-02-20 DIAGNOSIS — I6529 Occlusion and stenosis of unspecified carotid artery: Secondary | ICD-10-CM | POA: Insufficient documentation

## 2014-02-20 DIAGNOSIS — F3289 Other specified depressive episodes: Secondary | ICD-10-CM | POA: Insufficient documentation

## 2014-02-20 DIAGNOSIS — Z87891 Personal history of nicotine dependence: Secondary | ICD-10-CM | POA: Insufficient documentation

## 2014-02-20 DIAGNOSIS — I4892 Unspecified atrial flutter: Principal | ICD-10-CM | POA: Insufficient documentation

## 2014-02-20 DIAGNOSIS — Z951 Presence of aortocoronary bypass graft: Secondary | ICD-10-CM | POA: Insufficient documentation

## 2014-02-20 DIAGNOSIS — R0602 Shortness of breath: Secondary | ICD-10-CM | POA: Insufficient documentation

## 2014-02-20 DIAGNOSIS — I10 Essential (primary) hypertension: Secondary | ICD-10-CM | POA: Diagnosis present

## 2014-02-20 DIAGNOSIS — I739 Peripheral vascular disease, unspecified: Secondary | ICD-10-CM | POA: Diagnosis present

## 2014-02-20 DIAGNOSIS — I2510 Atherosclerotic heart disease of native coronary artery without angina pectoris: Secondary | ICD-10-CM | POA: Insufficient documentation

## 2014-02-20 DIAGNOSIS — F329 Major depressive disorder, single episode, unspecified: Secondary | ICD-10-CM | POA: Diagnosis present

## 2014-02-20 DIAGNOSIS — E785 Hyperlipidemia, unspecified: Secondary | ICD-10-CM | POA: Insufficient documentation

## 2014-02-20 DIAGNOSIS — M1990 Unspecified osteoarthritis, unspecified site: Secondary | ICD-10-CM | POA: Insufficient documentation

## 2014-02-20 DIAGNOSIS — K219 Gastro-esophageal reflux disease without esophagitis: Secondary | ICD-10-CM | POA: Insufficient documentation

## 2014-02-20 DIAGNOSIS — I350 Nonrheumatic aortic (valve) stenosis: Secondary | ICD-10-CM | POA: Diagnosis present

## 2014-02-20 DIAGNOSIS — J449 Chronic obstructive pulmonary disease, unspecified: Secondary | ICD-10-CM | POA: Diagnosis present

## 2014-02-20 HISTORY — DX: Peripheral vascular disease, unspecified: I73.9

## 2014-02-20 HISTORY — DX: Chronic obstructive pulmonary disease, unspecified: J44.9

## 2014-02-20 HISTORY — DX: Hyperlipidemia, unspecified: E78.5

## 2014-02-20 HISTORY — DX: Atherosclerotic heart disease of native coronary artery without angina pectoris: I25.10

## 2014-02-20 HISTORY — DX: Nonrheumatic aortic (valve) stenosis: I35.0

## 2014-02-20 HISTORY — DX: Depression, unspecified: F32.A

## 2014-02-20 HISTORY — DX: Essential (primary) hypertension: I10

## 2014-02-20 HISTORY — DX: Major depressive disorder, single episode, unspecified: F32.9

## 2014-02-20 HISTORY — DX: Unspecified osteoarthritis, unspecified site: M19.90

## 2014-02-20 MED ORDER — PANTOPRAZOLE SODIUM 40 MG PO TBEC
40.0000 mg | DELAYED_RELEASE_TABLET | Freq: Every day | ORAL | Status: DC
  Administered 2014-02-20: 40 mg via ORAL
  Filled 2014-02-20: qty 1

## 2014-02-20 MED ORDER — TIOTROPIUM BROMIDE MONOHYDRATE 18 MCG IN CAPS
18.0000 ug | ORAL_CAPSULE | Freq: Every day | RESPIRATORY_TRACT | Status: DC
  Administered 2014-02-21: 18 ug via RESPIRATORY_TRACT
  Filled 2014-02-20: qty 5

## 2014-02-20 MED ORDER — LOSARTAN POTASSIUM 25 MG PO TABS
25.0000 mg | ORAL_TABLET | Freq: Every day | ORAL | Status: DC
  Filled 2014-02-20: qty 1

## 2014-02-20 MED ORDER — METOPROLOL TARTRATE 25 MG PO TABS
25.0000 mg | ORAL_TABLET | Freq: Two times a day (BID) | ORAL | Status: DC
  Administered 2014-02-20 (×2): 25 mg via ORAL
  Filled 2014-02-20 (×4): qty 1

## 2014-02-20 MED ORDER — ASPIRIN EC 81 MG PO TBEC
81.0000 mg | DELAYED_RELEASE_TABLET | Freq: Every day | ORAL | Status: DC
  Filled 2014-02-20: qty 1

## 2014-02-20 MED ORDER — NITROGLYCERIN 0.4 MG SL SUBL: 0.4000 mg | SUBLINGUAL_TABLET | SUBLINGUAL | Status: DC | PRN

## 2014-02-20 MED ORDER — ASPIRIN 81 MG PO TABS: 81.0000 mg | ORAL_TABLET | Freq: Every day | ORAL | Status: DC

## 2014-02-20 MED ORDER — LORAZEPAM 0.5 MG PO TABS: 0.5000 mg | ORAL_TABLET | Freq: Four times a day (QID) | ORAL | Status: DC | PRN

## 2014-02-20 MED ORDER — RIVAROXABAN 20 MG PO TABS
20.0000 mg | ORAL_TABLET | Freq: Every day | ORAL | Status: DC
  Administered 2014-02-20: 20 mg via ORAL
  Filled 2014-02-20 (×2): qty 1

## 2014-02-20 MED ORDER — SERTRALINE HCL 50 MG PO TABS
50.0000 mg | ORAL_TABLET | Freq: Every day | ORAL | Status: DC
  Administered 2014-02-20: 50 mg via ORAL
  Filled 2014-02-20 (×2): qty 1

## 2014-02-20 MED ORDER — HYDROCHLOROTHIAZIDE 25 MG PO TABS
25.0000 mg | ORAL_TABLET | Freq: Every day | ORAL | Status: DC
  Filled 2014-02-20: qty 1

## 2014-02-20 MED ORDER — ONDANSETRON HCL 4 MG/2ML IJ SOLN: 4.0000 mg | Freq: Four times a day (QID) | INTRAMUSCULAR | Status: DC | PRN

## 2014-02-20 MED ORDER — ACETAMINOPHEN 325 MG PO TABS: 650.0000 mg | ORAL_TABLET | Freq: Four times a day (QID) | ORAL | Status: DC | PRN

## 2014-02-20 MED ORDER — HYDROCODONE-ACETAMINOPHEN 5-325 MG PO TABS: 1.0000 | ORAL_TABLET | Freq: Four times a day (QID) | ORAL | Status: DC | PRN

## 2014-02-20 MED ORDER — POTASSIUM CHLORIDE CRYS ER 20 MEQ PO TBCR
40.0000 meq | EXTENDED_RELEASE_TABLET | Freq: Once | ORAL | Status: AC
  Administered 2014-02-20: 40 meq via ORAL
  Filled 2014-02-20: qty 2

## 2014-02-20 MED ORDER — ATORVASTATIN CALCIUM 40 MG PO TABS
40.0000 mg | ORAL_TABLET | Freq: Every day | ORAL | Status: DC
  Administered 2014-02-20: 40 mg via ORAL
  Filled 2014-02-20 (×2): qty 1

## 2014-02-20 MED ORDER — ISOSORBIDE MONONITRATE ER 30 MG PO TB24
30.0000 mg | ORAL_TABLET | Freq: Every day | ORAL | Status: DC
  Filled 2014-02-20: qty 1

## 2014-02-20 MED ORDER — GABAPENTIN 600 MG PO TABS
600.0000 mg | ORAL_TABLET | Freq: Three times a day (TID) | ORAL | Status: DC
  Administered 2014-02-20 (×2): 600 mg via ORAL
  Filled 2014-02-20 (×5): qty 1

## 2014-02-20 MED ORDER — ALBUTEROL SULFATE (2.5 MG/3ML) 0.083% IN NEBU: 3.0000 mL | INHALATION_SOLUTION | Freq: Four times a day (QID) | RESPIRATORY_TRACT | Status: DC | PRN

## 2014-02-20 NOTE — H&P (Signed)
See office note as H&P by Dr Meda Coffee 02/19/14 Pt presents 02/20/14 for admission. When seen 02/19/14 in the office her rate was fast - 157. Her rate today is in the 80's. She will have 3-4 NSR beats in row followed by PACs and short runs of PAF. Will review with hospital DOD. Low dose beta blocker added, she was put on Xarelto yesterday, will continue this.   Kerin Ransom PA-C 02/20/2014 1:28 PM     Patient Name: EMELEE RODOCKER Date of Encounter: 02/19/2014   Primary Care Provider:  Nyoka Cowden, MD Primary Cardiologist:  Dorothy Spark (previously Dr Verl Blalock)   Problem List    Past Medical History   Diagnosis  Date   .  ANEMIA  08/28/2010   .  CAD, ARTERY BYPASS GRAFT  04/01/2010   .  COLONIC POLYPS, HX OF  06/15/2007   .  COPD, MILD  09/25/2010   .  CORONARY ARTERY DISEASE  06/15/2007   .  DEPRESSIVE DISORDER  06/06/2008   .  GERD  12/19/2009   .  HYPERLIPIDEMIA  06/15/2007   .  HYPERTENSION  06/15/2007   .  HYPOTENSION  08/11/2010   .  OSTEOARTHRITIS  06/15/2007   .  PEDAL EDEMA  06/06/2009   .  PERIPHERAL VASCULAR DISEASE  06/15/2007   .  Lumbar stenosis     .  Lung abscess  2011   .  CHF (congestive heart failure)     .  Hx of colonoscopy      Past Surgical History   Procedure  Laterality  Date   .  L subclavian bypass    07/2004   .  Tubal ligation       .  Coronary artery bypass graft    05/2000   .  Femoral popliteal bypass-r    04/1999   .  Right common iliac pta with stent placement    07/2000   .  Right leg blockage    2003   .  Aorta bifemoral bypass graft    2004   .  Left cea    2005   .  Lung cyst biopsy    2011      Allergies    Allergies   Allergen  Reactions   .  Codeine Phosphate         REACTION: unspecified      HPI   Mrs. Tamer returns today for evaluation and management of her coronary artery disease, history bypass surgery, and history of carotid disease.   The patient was previously followed by Dr. wall and has felt exceptionally  well since bypass surgery in 2001. However she states that since last Thursday she felt little dizzy and weak. She also admits to exertional shortness of breath. She had occasionally and she asked her called for help and she is she felt dizzy and another episode like that over the weekend. She also mentions that for the last couple of weeks she has felt really tired and sleeps majority of time when she is at home after work. She denies chest pain, shortness of breath at rest, orthopnea paroxysmal nocturnal dyspnea or lower extremity edema. She denies any syncope.   Home Medications    Prior to Admission medications    Medication  Sig  Start Date  End Date  Taking?  Authorizing Provider   acetaminophen (TYLENOL) 325 MG tablet  Take 650 mg by mouth every 6 (six) hours as needed.  Yes  Historical Provider, MD   albuterol (PROVENTIL HFA;VENTOLIN HFA) 108 (90 BASE) MCG/ACT inhaler  Inhale 2 puffs into the lungs every 6 (six) hours as needed for wheezing or shortness of breath.      Yes  Historical Provider, MD   aspirin 81 MG tablet  Take 81 mg by mouth daily.      Yes  Historical Provider, MD   cilostazol (PLETAL) 100 MG tablet  TAKE 1 TABLET BY MOUTH 2 TIMES A DAY  08/29/13    Yes  Marletta Lor, MD   felodipine (PLENDIL) 5 MG 24 hr tablet  TAKE 1 TABLET (5 MG TOTAL) BY MOUTH DAILY.  09/19/13    Yes  Marletta Lor, MD   gabapentin (NEURONTIN) 600 MG tablet  Take 1 tablet (600 mg total) by mouth 3 (three) times daily.  07/23/13    Yes  Marletta Lor, MD   hydrochlorothiazide (HYDRODIURIL) 25 MG tablet    09/03/13    Yes  Historical Provider, MD   HYDROcodone-acetaminophen (NORCO/VICODIN) 5-325 MG per tablet  TAKE 1/2 A TABLET BY MOUTH 6 TIMES A DAY AS NEEDED  12/07/13    Yes  Marletta Lor, MD   isosorbide mononitrate (IMDUR) 30 MG 24 hr tablet  Take 1 tablet (30 mg total) by mouth daily.  09/18/12    Yes  Marletta Lor, MD   LORazepam (ATIVAN) 0.5 MG tablet  TAKE 1 TABLET  BY MOUTH TWICE DAILY  10/29/13    Yes  Marletta Lor, MD   losartan (COZAAR) 25 MG tablet  TAKE 1 TABLET (25 MG TOTAL) BY MOUTH DAILY.      Yes  Elsie Stain, MD   meloxicam (MOBIC) 7.5 MG tablet  Take 7.5 mg by mouth daily.   09/15/13    Yes  Historical Provider, MD   nitroGLYCERIN (NITROSTAT) 0.4 MG SL tablet  Place 0.4 mg under the tongue every 5 (five) minutes as needed.  08/13/11    Yes  Renella Cunas, MD   omeprazole (PRILOSEC) 20 MG capsule  TAKE 1 CAPSULE (20 MG TOTAL) BY MOUTH 2 (TWO) TIMES DAILY.  10/09/13    Yes  Marletta Lor, MD   promethazine (PHENERGAN) 25 MG tablet  TAKE ONE TABLET BY MOUTH EVERY 6 HOURS FOR NAUSEA  07/03/13    Yes  Marletta Lor, MD   sertraline (ZOLOFT) 50 MG tablet  TAKE 1 TABLET (50 MG TOTAL) BY MOUTH DAILY.  12/04/13    Yes  Marletta Lor, MD   simvastatin (ZOCOR) 80 MG tablet  Take 1 tablet (80 mg total) by mouth at bedtime.  09/18/12    Yes  Marletta Lor, MD   tiotropium (SPIRIVA HANDIHALER) 18 MCG inhalation capsule  Place 1 capsule (18 mcg total) into inhaler and inhale daily.  11/19/13  11/19/14  Yes  Tanda Rockers, MD        Family History    Family History   Problem  Relation  Age of Onset   .  Diabetes  Father     .  Heart attack  Mother     .  Diabetes       .  Coronary artery disease       .  Colon cancer  Neg Hx        Social History    History       Social History   .  Marital Status:  Widowed  Spouse Name:  N/A       Number of Children:  2   .  Years of Education:  N/A       Occupational History   .           Social History Main Topics   .  Smoking status:  Former Smoker -- 1.50 packs/day for 40 years       Types:  Cigarettes       Quit date:  06/29/2000   .  Smokeless tobacco:  Never Used   .  Alcohol Use:  No   .  Drug Use:  No   .  Sexual Activity:  Not on file       Other Topics  Concern   .  Not on file       Social History Narrative   .  No narrative on file       Review of Systems, as per HPI, otherwise negative General:  No chills, fever, night sweats or weight changes.   Cardiovascular:  No chest pain, dyspnea on exertion, edema, orthopnea, palpitations, paroxysmal nocturnal dyspnea. Dermatological: No rash, lesions/masses Respiratory: No cough, dyspnea Urologic: No hematuria, dysuria Abdominal:   No nausea, vomiting, diarrhea, bright red blood per rectum, melena, or hematemesis Neurologic:  No visual changes, wkns, changes in mental status. All other systems reviewed and are otherwise negative except as noted above.   Physical Exam   Blood pressure 112/62, height 5' 2.5" (1.588 m), weight 153 lb 12.8 oz (69.763 kg).  General: Pleasant, NAD Psych: Normal affect. Neuro: Alert and oriented X 3. Moves all extremities spontaneously. HEENT: Normal          Neck: Supple without bruits or JVD. Lungs:  Resp regular and unlabored, CTA. Heart: RRR no s3, s4, or murmurs. Abdomen: Soft, non-tender, non-distended, BS + x 4.   Extremities: No clubbing, cyanosis or edema. DP/PT/Radials 2+ and equal bilaterally.   Labs:   No results found for this basename: CKTOTAL, CKMB, TROPONINI,  in the last 72 hours Lab Results   Component  Value  Date     WBC  9.0  10/02/2013     HGB  13.1  10/02/2013     HCT  38.5  10/02/2013     MCV  88.5  10/02/2013     PLT  210.0  10/02/2013    No results found for this basename: NA, K, CL, CO2, BUN, CREATININE, CALCIUM, LABALBU, PROT, BILITOT, ALKPHOS, ALT, AST, GLUCOSE,  in the last 168 hours Lab Results   Component  Value  Date     CHOL  155  10/02/2013     HDL  54.40  10/02/2013     LDLCALC  64  09/18/2012     TRIG  215.0*  10/02/2013    No results found for this basename: DDIMER    No components found with this basename: POCBNP,    Accessory Clinical Findings   Echocardiogram - 01/2013 Study Conclusions  - Left ventricle: The cavity size was normal. Wall thickness was increased in a pattern of mild  LVH. Systolic function was normal. The estimated ejection fraction was in the range of 55% to 60%. Wall motion was normal; there were no regional wall motion abnormalities. Doppler parameters are consistent with abnormal left ventricular relaxation (grade 1 diastolic dysfunction). - Aortic valve: There was mild stenosis. Mean gradient: 28mm Hg (S). Peak gradient: 16mm Hg (S). - Mitral valve: Calcified annulus. - Left atrium: The atrium was  mildly dilated. - Pulmonary arteries: Systolic pressure was mildly increased. PA peak pressure: 82mm Hg (S). Impressions:  - Calcified aortic valve with mildly reduced cusp excursion; mild AS by doppler. There is note of elevated gradient from suprasternal notch most likely from right innominate artery (5 m/s); suggest dopplers to further assess.   ECG - SVT, possible atrial flutter with 2 : 1 block.     Assessment & Plan   A pleasant 73 year old female with prior medical history of coronary artery disease, status post bypass surgery in 2001   1. new diagnosis of SVT, most probably atrial flutter with 2 :1 block, it was suggested to the patient that she would be admitted to the hospital today, however she needs to do so arrangements at home and would prefer to be admitted tomorrow. We will admit her to telemetry to be started on diltiazem and possible TEE cardioversion if ineffective. We will start her on Xarelto 20 mg today, she was given free samples in the clinic. Her last echocardiogram in March 2014 showed preserved LV function and mildly dilated left atrium. I won't give her any antiarrhythmics at this point if she is not anticoagulated. She is currently euvolemic and doesn't appear to be in heart failure.   2. CAD - there are ST depressions in inferolateral leads however these might just secondary to flutter waves. Considering her significant symptoms she will most probably need ischemic workup in the hospital.   3. Hypertension -  controlled on current regimen   4. Hyperlipidemia - elevated triglycerides previously and fish oil. She was taken off the fish oil by her pulmonologist as he bili peak beats contributing to her cough.     Dorothy Spark, MD, [facility] 02/19/2014, 9:29 AM

## 2014-02-20 NOTE — H&P (Signed)
[name] 02/20/2014

## 2014-02-20 NOTE — Progress Notes (Signed)
Reviewed telemetry with Dr Claiborne Billings- he agrees pt predominantly in NSR now with PACs.  Will monitor overnight on beta blocker, NPO after midnight in case she needs TEE CV in am.   Kerin Ransom PA-C 02/20/2014 2:45 PM

## 2014-02-21 ENCOUNTER — Encounter (HOSPITAL_COMMUNITY): Payer: Self-pay | Admitting: Physician Assistant

## 2014-02-21 ENCOUNTER — Other Ambulatory Visit: Payer: Self-pay | Admitting: Physician Assistant

## 2014-02-21 DIAGNOSIS — I4892 Unspecified atrial flutter: Secondary | ICD-10-CM

## 2014-02-21 DIAGNOSIS — I25810 Atherosclerosis of coronary artery bypass graft(s) without angina pectoris: Secondary | ICD-10-CM

## 2014-02-21 LAB — BASIC METABOLIC PANEL
BUN: 19 mg/dL (ref 6–23)
CO2: 29 mEq/L (ref 19–32)
Calcium: 10.2 mg/dL (ref 8.4–10.5)
Chloride: 100 mEq/L (ref 96–112)
Creatinine, Ser: 0.87 mg/dL (ref 0.50–1.10)
GFR calc Af Amer: 75 mL/min — ABNORMAL LOW (ref >90)
GFR calc non Af Amer: 65 mL/min — ABNORMAL LOW (ref >90)
Glucose, Bld: 122 mg/dL — ABNORMAL HIGH (ref 70–99)
Potassium: 3.9 mEq/L (ref 3.7–5.3)
Sodium: 142 mEq/L (ref 137–147)

## 2014-02-21 MED ORDER — METOPROLOL TARTRATE 25 MG PO TABS: 25.0000 mg | ORAL_TABLET | Freq: Two times a day (BID) | ORAL | Status: DC

## 2014-02-21 MED ORDER — ATORVASTATIN CALCIUM 40 MG PO TABS: 40.0000 mg | ORAL_TABLET | Freq: Every evening | ORAL | Status: DC

## 2014-02-21 NOTE — Progress Notes (Signed)
Discharged to home with family office visits in place teaching done  

## 2014-02-21 NOTE — Discharge Summary (Signed)
Discharge Summary   Patient ID: ILLEANA EDICK MRN: [id number], DOB/AGE: October 02, 1941 73 y.o. Admit date: 02/20/2014 D/C date:     02/21/2014  Primary Care Provider: Nyoka Cowden, MD Primary Cardiologist: Meda Coffee  Primary Discharge Diagnoses:  1. Paroxysmal atrial flutter with RVR - dx 02/19/14, in NSR during hospitalization 2. CAD s/p remote CABG 3. HTN  Secondary Discharge Diagnoses:  1. H/o anemia (not presently) 2. Colon polyps 3. COPD 4. Depressive disorder 5. GERD 6. HTN 7. HLD 8. H/o hypotension 9. OA 10. Peripheral vascular disease/carotid artery disease - duplex 03/2013: stable moderate carotid dz - 73-41% RICA, 93-79% LICA, L subclavian artery to CCA stent widely patent; Prior R fempop bypass graft, R CIA stent 11. Lumbar stenosis 12. Lung abscess 2011 13. Reported h/o CHF, details unclear 14. Mild AS by echo [facility] Course: Ms. Vanacker is a 73 y/o with history of CAD s/p CABG, PVD/carotid disease, COPD, HTN who presented to [facility] 02/20/2014 for direct admission. She was recently seen in the office 02/19/14 with symptoms of dizziness, weakness, and edxertional SOB. EKG showed HR 150's most likely 2:1 atrial flutter with ST depressions in inferolateral leads. She denied chest pain, shortness of breath at rest, orthopnea paroxysmal nocturnal dyspnea, syncope, or lower extremity edema. Hospital admission was recommended but she declined, agreeing to come in the following day. She was started on Xarelto as an outpatient. TSH was normal. Last echo 01/2014 - mild LVH, EF 55-60%, no RWMA, mild AS calcific AV with mildly reduced cusp excursion, mildly dilated LA, PAP 27mmHg. She returned 02/20/14 to the hospital as planned for admission at which time she was in NSR with PACs. She was started on metoprolol 25mg  BID. She was monitored overnight and remained stable. Today she remains in NSR. She denies CP, palpitations or dyspnea. Dr. Burt Knack has seen and examined  the patient today and feels she is stable for discharge. Per discussion with him, will stop aspirin given Xarelto but continue Pletal. Per Dr. Francesca Oman note, agree that [facility] should be obtained to evaluate for ischemia considering her remote CABG. Also will arrange a 30 day event monitor since the patient does not have typical symptoms of tachypalpitations. She will follow up in the office as well.   Will defer to Dr. Meda Coffee regarding updated echocardiogram given mild AS by echo in 2014 as well as history of "CHF" listed in problem list but details unclear. Also of note during this admission Zocor 80 was substituted with Lipitor 40. Further monitoring of lipids/LFTs per MD.  Discharge Vitals: Blood pressure 122/65, pulse 78, temperature 98.4 F (36.9 C), temperature source Oral, resp. rate 18, height 5' 2.5" (1.588 m), weight 152 lb 12.5 oz (69.3 kg), SpO2 94.00%.  Labs: Lab Results  Component Value Date   WBC 9.2 02/19/2014   HGB 12.9 02/19/2014   HCT 39.8 02/19/2014   MCV 85.9 02/19/2014   PLT 242.0 02/19/2014     Recent Labs Lab 02/19/14 1012 02/21/14 0330  NA 139 142  K 3.2* 3.9  CL 97 100  CO2 32 29  BUN 15 19  CREATININE 0.9 0.87  CALCIUM 10.3 10.2  PROT 7.5  --   BILITOT 0.4  --   ALKPHOS 72  --   ALT 19  --   AST 25  --   GLUCOSE 102* 122*    Lab Results  Component Value Date   CHOL 155 10/02/2013   HDL 54.40 10/02/2013   LDLCALC 64  09/18/2012   TRIG 215.0* 10/02/2013    Diagnostic Studies/Procedures   No results found.  Discharge Medications   Current Discharge Medication List    START taking these medications   Details  atorvastatin (LIPITOR) 40 MG tablet Take 1 tablet (40 mg total) by mouth every evening. Qty: 30 tablet, Refills: 6    metoprolol tartrate (LOPRESSOR) 25 MG tablet Take 1 tablet (25 mg total) by mouth 2 (two) times daily. Qty: 60 tablet, Refills: 6      CONTINUE these medications which have NOT CHANGED   Details  acetaminophen  (TYLENOL) 325 MG tablet Take 650 mg by mouth every 6 (six) hours as needed.      albuterol (PROVENTIL HFA;VENTOLIN HFA) 108 (90 BASE) MCG/ACT inhaler Inhale 2 puffs into the lungs every 6 (six) hours as needed for wheezing or shortness of breath.    cilostazol (PLETAL) 100 MG tablet Take 100 mg by mouth 2 (two) times daily.    gabapentin (NEURONTIN) 600 MG tablet Take 1 tablet (600 mg total) by mouth 3 (three) times daily.     hydrochlorothiazide (HYDRODIURIL) 25 MG tablet Take 25 mg by mouth daily.     HYDROcodone-acetaminophen (NORCO/VICODIN) 5-325 MG per tablet Take 1 tablet by mouth every 6 (six) hours as needed for moderate pain.    isosorbide mononitrate (IMDUR) 30 MG 24 hr tablet Take 1 tablet (30 mg total) by mouth daily. Qty: 90 tablet, Refills: 3    LORazepam (ATIVAN) 0.5 MG tablet Take 0.5 mg by mouth 2 (two) times daily as needed for anxiety.    losartan (COZAAR) 25 MG tablet Take 25 mg by mouth daily.    omeprazole (PRILOSEC) 20 MG capsule Take 20 mg by mouth daily.    promethazine (PHENERGAN) 25 MG tablet Take 25 mg by mouth every 6 (six) hours as needed for nausea or vomiting.    Rivaroxaban (XARELTO) 20 MG TABS tablet Take 20 mg by mouth daily with supper.    sertraline (ZOLOFT) 50 MG tablet Take 25 mg by mouth daily.    therapeutic multivitamin-minerals (THERAGRAN-M) tablet Take 1 tablet by mouth daily.    tiotropium (SPIRIVA HANDIHALER) 18 MCG inhalation capsule Place 1 capsule (18 mcg total) into inhaler and inhale daily.    Associated Diagnoses: Chronic airway obstruction, not elsewhere classified    trolamine salicylate (ASPERCREME) 10 % cream Apply 1 application topically as needed for muscle pain.    nitroGLYCERIN (NITROSTAT) 0.4 MG SL tablet Place 0.4 mg under the tongue every 5 (five) minutes as needed.      STOP taking these medications     aspirin 81 MG tablet      simvastatin (ZOCOR) 80 MG tablet         Disposition   The patient will be  discharged in stable condition to home.  Future Appointments Provider Department Dept Phone   03/06/2014 12:00 PM [facility] ([place] [facility]) [facility] CARDIOVASCULAR IMAGING [place] [phone]   03/11/2014 1:30 PM Burtis Junes, NP [place] Office [phone]   06/10/2014 8:00 AM Marletta Lor, MD [place] [facility] [place]     Follow-up Information   Follow up with [facility]. (Nuclear stress test 03/06/14 at 12pm - see last page of AVS for instructions)    Specialty:  Cardiology   Contact information:   [address] [place] [address] [phone]      Follow up with [facility]  [facility]. (Office will call you to set up a time to pick up your heart monitor to wear.)    Specialty:  Cardiology   Contact information:   [address] [place] [address] [phone]      Follow up with Truitt Merle, NP. ([place] - 03/11/14 at 1:30pm)    Specialty:  Nurse Practitioner   Contact information:   [place]. [address] [place] [address] [phone]         Duration of Discharge Encounter: Greater than 30 minutes including physician and PA time.  Signed, Melina Copa PA-C 02/21/2014, 9:55 AM

## 2014-02-21 NOTE — Progress Notes (Signed)
    Subjective:  No chest pain, dyspnea, or palpitations.  Objective:  Vital Signs in the last 24 hours: Temp:  [97.6 F (36.4 C)-98.4 F (36.9 C)] 98.4 F (36.9 C) (03/26 0300) Pulse Rate:  [75-87] 78 (03/26 0300) Resp:  [18] 18 (03/26 0300) BP: (122-142)/(51-65) 122/65 mmHg (03/26 0300) SpO2:  [94 %-97 %] 94 % (03/26 0756) Weight:  [69.3 kg (152 lb 12.5 oz)] 69.3 kg (152 lb 12.5 oz) (03/25 1017)  Intake/Output from previous day: 03/25 0701 - 03/26 0700 In: 840 [P.O.:840] Out: 1000 [Urine:1000]  Physical Exam: Pt is alert and oriented, NAD HEENT: normal Neck: JVP - normal, carotids 2+= without bruits Lungs: CTA bilaterally CV: RRR with grade 2/6 systolic murmur at the right upper sternal border Abd: soft, NT, Positive BS, no hepatomegaly Ext: no C/C/E, distal pulses intact and equal Skin: warm/dry no rash   Lab Results:  Recent Labs  02/19/14 1012  WBC 9.2  HGB 12.9  PLT 242.0    Recent Labs  02/19/14 1012 02/21/14 0330  NA 139 142  K 3.2* 3.9  CL 97 100  CO2 32 29  GLUCOSE 102* 122*  BUN 15 19  CREATININE 0.9 0.87   No results found for this basename: TROPONINI, CK, MB,  in the last 72 hours  Tele: Sinus rhythm with occasional PACs and PVCs  Assessment/Plan:  1. Paroxysmal atrial flutter with RVR 2. CAD status post CABG remotely 3. Hypertension  The patient has been started on Xarelto for anticoagulation. She has not had any atrial flutter since admission. She has also been started on metoprolol 25 mg twice daily. I think she can be discharged home this morning. Per Dr. Francesca Oman note, agree that [facility] should be obtained to evaluate for ischemia considering her remote CABG. Also will arrange a 30 day event monitor since the patient does not have typical symptoms of tachypalpitations. I think this will be important to assess the burden of her atrial flutter. Patient will followup with Dr. Meda Coffee or her PA/NP in the office.  Sherren Mocha,  M.D. 02/21/2014, 9:03 AM

## 2014-02-21 NOTE — Discharge Instructions (Signed)
° °  You have a Stress Test scheduled at [facility]. Your doctor has ordered this test to get a better idea of how your heart works.  Please arrive 15 minutes early for paperwork.   Location: [address], [place], [place] [address] [phone]  Instructions:  No food/drink after midnight the night before.   No caffeine/decaf products 24 hours before, including medicines such as Excedrin or Goody Powders. Call if there are any questions.   Wear comfortable clothes and shoes.   It is OK to take your morning meds with a sip of water EXCEPT for those types of medicines listed below or otherwise instructed.  Special Medication Instructions:  Beta blockers such as metoprolol (Lopressor/Toprol XL), atenolol (Tenormin), carvedilol (Coreg), nebivolol (Bystolic), propranolol (Inderal) should not be taken for 12 hours before the test if it's a twice-a-day medicine, or 24-hours if it it's a once-a-day medicine.  Calcium channel blockers such as diltiazem (Cardizem) or verapmil (Calan) should not be taken for 24 hours before the test.  Remove nitroglycerin patches and do not take nitrate preparations such as Imdur/isosorbide the day of your test.  No Persantine/Theophylline or Aggrenox medicines should be used within 24 hours of the test.   What To Expect: The whole test will take several hours. When you arrive in the lab, the technician will inject a small amount of radioactive tracer into your arm through an IV while you are resting quietly. This helps [place] to form pictures of your heart. You will likely only feel a sting from the IV. After a waiting period, resting pictures will be obtained under a big camera. These are the "before" pictures.  Next, you will be prepped for the stress portion of the test. This may include either walking on a treadmill or receiving a medicine that helps to dilate blood vessels in your heart to simulate the effect of exercise on  your heart. If you are walking on a treadmill, you will walk at different paces to try to get your heart rate to a goal number that is based on your age. If your doctor has chosen the pharmacologic test, then you will receive a medicine through your IV that may cause temporary nausea, flushing, shortness of breath and sometimes chest discomfort or vomiting. This is typically short-lived and usually resolves quickly. Your blood pressure and heart rate will be monitored, and we will be watching your EKG on a computer screen for any changes. During this portion of the test, the radiologist will inject another small amount of radioactive tracer into your IV. After a waiting period, you will undergo a second set of pictures. These are the "after" pictures.  The doctor reading the test will compare the before-and-after images to look for evidence of heart blockages or heart weakness. In certain instances, this test is done over 2 days but usually only takes 1 day to complete.

## 2014-02-25 ENCOUNTER — Encounter: Payer: Self-pay | Admitting: *Deleted

## 2014-02-25 ENCOUNTER — Encounter (INDEPENDENT_AMBULATORY_CARE_PROVIDER_SITE_OTHER): Payer: Medicare Other

## 2014-02-25 ENCOUNTER — Telehealth: Payer: Self-pay | Admitting: Cardiology

## 2014-02-25 DIAGNOSIS — I4892 Unspecified atrial flutter: Secondary | ICD-10-CM

## 2014-02-25 DIAGNOSIS — I4891 Unspecified atrial fibrillation: Secondary | ICD-10-CM

## 2014-02-25 NOTE — Telephone Encounter (Signed)
New message          [name] is reporting a critical ekg for pt.

## 2014-02-25 NOTE — Telephone Encounter (Signed)
**Note De-Identified Dollie Bressi Obfuscation** Aaron Edelman with E cardio states that according to the pts monitor she went into a-fib for about 25 seconds this afternoon with a HR of 170.  I called the pt and she is asymptomatic and stated that she just got the monitor put on today and that she was messing with it eariler today and may have messed something up on it. The pt states that when [facility] contacted her today to ask if she was having any symptoms she explained to them that she was having difficulties with the monitor and they helped her over the phone. The pt states that she is not having any symptoms or difficulties with the monitor at this time.

## 2014-02-25 NOTE — Progress Notes (Signed)
Patient ID: [name], female   DOB: 08-23-1941, 73 y.o.   MRN: [id number] [facility] verite 30 day cardiac event monitor applied to patient.

## 2014-02-27 ENCOUNTER — Other Ambulatory Visit: Payer: Self-pay | Admitting: Internal Medicine

## 2014-02-28 ENCOUNTER — Encounter: Payer: Self-pay | Admitting: Cardiology

## 2014-03-05 ENCOUNTER — Telehealth: Payer: Self-pay | Admitting: *Deleted

## 2014-03-05 MED ORDER — METOPROLOL TARTRATE 25 MG PO TABS: 50.0000 mg | ORAL_TABLET | Freq: Two times a day (BID) | ORAL | Status: DC

## 2014-03-05 NOTE — Telephone Encounter (Signed)
Spoke with patient and she is completely unaware of her heart racing  E-Cardio called reporting patient to be in afib with a HR of 160.  Dr Meda Coffee looked at strips and recommended patient to increase Metoprolol to 50mg  twice daily and stop Imdur.  She is going to keep a watch on her BP's and let office know if she becomes symptomatic   Patient verbalizes understanding and agrees with plan. She will call back if needs anything further

## 2014-03-06 ENCOUNTER — Ambulatory Visit (HOSPITAL_COMMUNITY): Payer: Medicare Other | Attending: Cardiovascular Disease | Admitting: Radiology

## 2014-03-06 ENCOUNTER — Telehealth: Payer: Self-pay | Admitting: *Deleted

## 2014-03-06 ENCOUNTER — Telehealth: Payer: Self-pay

## 2014-03-06 ENCOUNTER — Encounter: Payer: Self-pay | Admitting: Cardiovascular Disease

## 2014-03-06 ENCOUNTER — Other Ambulatory Visit: Payer: Self-pay

## 2014-03-06 VITALS — BP 130/60 | Ht 62.5 in | Wt 156.0 lb

## 2014-03-06 DIAGNOSIS — J4489 Other specified chronic obstructive pulmonary disease: Secondary | ICD-10-CM | POA: Insufficient documentation

## 2014-03-06 DIAGNOSIS — R0989 Other specified symptoms and signs involving the circulatory and respiratory systems: Secondary | ICD-10-CM | POA: Insufficient documentation

## 2014-03-06 DIAGNOSIS — Z87891 Personal history of nicotine dependence: Secondary | ICD-10-CM | POA: Insufficient documentation

## 2014-03-06 DIAGNOSIS — I779 Disorder of arteries and arterioles, unspecified: Secondary | ICD-10-CM | POA: Insufficient documentation

## 2014-03-06 DIAGNOSIS — R5383 Other fatigue: Secondary | ICD-10-CM

## 2014-03-06 DIAGNOSIS — R002 Palpitations: Secondary | ICD-10-CM | POA: Insufficient documentation

## 2014-03-06 DIAGNOSIS — Z951 Presence of aortocoronary bypass graft: Secondary | ICD-10-CM | POA: Insufficient documentation

## 2014-03-06 DIAGNOSIS — E785 Hyperlipidemia, unspecified: Secondary | ICD-10-CM | POA: Insufficient documentation

## 2014-03-06 DIAGNOSIS — R9439 Abnormal result of other cardiovascular function study: Secondary | ICD-10-CM

## 2014-03-06 DIAGNOSIS — Z8249 Family history of ischemic heart disease and other diseases of the circulatory system: Secondary | ICD-10-CM | POA: Insufficient documentation

## 2014-03-06 DIAGNOSIS — I739 Peripheral vascular disease, unspecified: Secondary | ICD-10-CM | POA: Insufficient documentation

## 2014-03-06 DIAGNOSIS — R42 Dizziness and giddiness: Secondary | ICD-10-CM | POA: Insufficient documentation

## 2014-03-06 DIAGNOSIS — J449 Chronic obstructive pulmonary disease, unspecified: Secondary | ICD-10-CM | POA: Insufficient documentation

## 2014-03-06 DIAGNOSIS — I10 Essential (primary) hypertension: Secondary | ICD-10-CM | POA: Insufficient documentation

## 2014-03-06 DIAGNOSIS — R0602 Shortness of breath: Secondary | ICD-10-CM | POA: Insufficient documentation

## 2014-03-06 DIAGNOSIS — R0609 Other forms of dyspnea: Secondary | ICD-10-CM | POA: Insufficient documentation

## 2014-03-06 DIAGNOSIS — I4949 Other premature depolarization: Secondary | ICD-10-CM

## 2014-03-06 DIAGNOSIS — R5381 Other malaise: Secondary | ICD-10-CM | POA: Insufficient documentation

## 2014-03-06 MED ORDER — RIVAROXABAN 20 MG PO TABS: 20.0000 mg | ORAL_TABLET | Freq: Every day | ORAL | Status: DC

## 2014-03-06 MED ORDER — TECHNETIUM TC 99M SESTAMIBI GENERIC - CARDIOLITE
30.0000 | Freq: Once | INTRAVENOUS | Status: AC | PRN
  Administered 2014-03-06: 30 via INTRAVENOUS

## 2014-03-06 MED ORDER — TECHNETIUM TC 99M SESTAMIBI GENERIC - CARDIOLITE
10.0000 | Freq: Once | INTRAVENOUS | Status: AC | PRN
  Administered 2014-03-06: 10 via INTRAVENOUS

## 2014-03-06 MED ORDER — REGADENOSON 0.4 MG/5ML IV SOLN
0.4000 mg | Freq: Once | INTRAVENOUS | Status: AC
Start: 2014-03-06 — End: 2014-03-06
  Administered 2014-03-06: 0.4 mg via INTRAVENOUS

## 2014-03-06 NOTE — Telephone Encounter (Signed)
The pt is advised, she verbalized understanding and agrees with plan. Message sent to [facility] to call pt to arrange EP consult.

## 2014-03-06 NOTE — Progress Notes (Signed)
[place] [facility] NUCLEAR MED [address] [place], [place] [address] [phone]    Cardiology Nuclear Med Study  [name] is a 73 y.o. female     MRN : [id number]     DOB: October 07, 1941  Procedure Date: 03/06/2014  Nuclear Med Background Indication for Stress Test:  Evaluation for Ischemia, Graft Patency and [facility]- 3/15 SVT/A-Flutter History:  COPD and '11 MPI: EF: 74% -CABG, '14 ECHO: EF: 55-60% mild AS Cardiac Risk Factors: Carotid Disease, Family History - CAD, History of Smoking, Hypertension, Lipids and PVD  Symptoms:  Dizziness, DOE, Fatigue, Palpitations and SOB   Nuclear Pre-Procedure Caffeine/Decaff Intake:  None NPO After: 8:00am   Lungs:  clear O2 Sat: 98% on room air. IV 0.9% NS with Angio Cath:  22g  IV Site: R Antecubital  IV Started by:  Crissie Figures, RN  Chest Size (in):  36 Cup Size: D  Height: 5' 2.5" (1.588 m)  Weight:  156 lb (70.761 kg)  BMI:  Body mass index is 28.06 kg/(m^2). Tech Comments:  N/A    Nuclear Med Study 1 or 2 day study: 1 day  Stress Test Type:  Lexiscan  Reading MD: N/A  Order Authorizing Provider:  Ena Dawley, MD  Resting Radionuclide: Technetium 78m Sestamibi  Resting Radionuclide Dose: 11.0 mCi   Stress Radionuclide:  Technetium 73m Sestamibi  Stress Radionuclide Dose: 33.0 mCi           Stress Protocol Rest HR: 80 Stress HR: 90  Rest BP: 130/60 Stress BP: 148/48  Exercise Time (min): n/a METS: n/a   Predicted Max HR: 148 bpm % Max HR: 60.81 bpm Rate Pressure Product: 13320   Dose of Adenosine (mg):  n/a Dose of Lexiscan: 0.4 mg  Dose of Atropine (mg): n/a Dose of Dobutamine: n/a mcg/kg/min (at max HR)  Stress Test Technologist: Perrin Maltese, EMT-P  Nuclear Technologist:  Charlton Amor, CNMT     Rest Procedure:  Myocardial perfusion imaging was performed at rest 45 minutes following the intravenous administration of Technetium 51m Sestamibi. Rest ECG: NSR - Normal EKG  Stress  Procedure:  The patient received IV Lexiscan 0.4 mg over 15-seconds.  Technetium 24m Sestamibi injected at 30-seconds. This patient was dizzy with the Lexiscan injection. Quantitative spect images were obtained after a 45 minute delay. Stress ECG: There are scattered PVCs and PAC's.  QPS Raw Data Images:  Mild diaphragmatic attenuation.  Normal left ventricular size. Stress Images:  medium sized moderate defect in the anterior and anteroseptal walls Rest Images:  small mild defect in the anteroseptum Subtraction (SDS):  These findings are consistent with ischemia in the mid and distal anterior wall Transient Ischemic Dilatation (Normal <1.22):  1.04 Lung/Heart Ratio (Normal <0.45):  0.38  Quantitative Gated Spect Images QGS EDV:  n/a ml QGS ESV:  n/a ml  Impression Exercise Capacity:  Lexiscan with no exercise. BP Response:  Hypotensive blood pressure response. Clinical Symptoms:  dizziness ECG Impression:  No significant ST segment change suggestive of ischemia. Comparison with Prior Nuclear Study: compared to scan of 2011 the anterior defect is new  Overall Impression:  High risk stress nuclear study with evidence of ischemia in the mid and distal anterior wall.  LV Ejection Fraction: Study not gated.  LV Wall Motion:  study not gated due to PVC's and PACs.  Suggest 2D echo to assess LVF  Signed: Fransico Him, MD

## 2014-03-06 NOTE — Telephone Encounter (Signed)
Pt here in office in lexi myoview.  She tells me that she didn't have any symptoms. They called her and asked her to push button, they picked something up - so pt pushed button. She further explains that she then felt a slight pain, but pain left quickly.

## 2014-03-06 NOTE — Telephone Encounter (Signed)
Message copied by VIA, Deliah Boston on Wed Mar 06, 2014 12:35 PM ------      Message from: Dorothy Spark      Created: Tue Mar 05, 2014  5:55 PM       Would you schedule her for an EP consult with Dr Lovena Le for a flutter ablation?      Thank you!      Houston Siren ------

## 2014-03-06 NOTE — Telephone Encounter (Addendum)
LMTCB - to assess if symptomatic  Patient activated eCardio report AFib RVR w/ couplet, rate 160 at 2pm

## 2014-03-06 NOTE — Telephone Encounter (Signed)
Same occurrence at noon yesterday. Dr Meda Coffee increased Metoprolol yesterday. Follow up with Truitt Merle, NP on Monday.  No new orders.

## 2014-03-07 ENCOUNTER — Telehealth: Payer: Self-pay

## 2014-03-07 DIAGNOSIS — I25810 Atherosclerosis of coronary artery bypass graft(s) without angina pectoris: Secondary | ICD-10-CM

## 2014-03-07 DIAGNOSIS — R9439 Abnormal result of other cardiovascular function study: Secondary | ICD-10-CM

## 2014-03-07 NOTE — Telephone Encounter (Signed)
Spoke to patient Dr.Nelson advised cardiac cath for positive stress test.Cardiac cath scheduled with Dr.Jordan 03/11/14 at 12:00 noon at [facility] instructions given to patient over the phone,hold xarelto day before cath starting Sunday 03/10/14.Patient will have pre cath lab,cxr tomorrow 03/08/14.Advised to pick up written copy of cath instructions left at 3rd floor front desk.Follow up appointment scheduled with Dr.Nelson 03/27/14 at 11:30 am.

## 2014-03-08 ENCOUNTER — Telehealth: Payer: Self-pay | Admitting: Critical Care Medicine

## 2014-03-08 ENCOUNTER — Ambulatory Visit
Admission: RE | Admit: 2014-03-08 | Discharge: 2014-03-08 | Disposition: A | Discharge status: Home / Self Care | Payer: Medicare Other | Source: Ambulatory Visit | Attending: Cardiology | Admitting: Cardiology

## 2014-03-08 ENCOUNTER — Other Ambulatory Visit (INDEPENDENT_AMBULATORY_CARE_PROVIDER_SITE_OTHER): Payer: Medicare Other

## 2014-03-08 DIAGNOSIS — R9439 Abnormal result of other cardiovascular function study: Secondary | ICD-10-CM

## 2014-03-08 DIAGNOSIS — I25810 Atherosclerosis of coronary artery bypass graft(s) without angina pectoris: Secondary | ICD-10-CM

## 2014-03-08 LAB — BASIC METABOLIC PANEL
BUN: 17 mg/dL (ref 6–23)
CO2: 27 mEq/L (ref 19–32)
Calcium: 10.1 mg/dL (ref 8.4–10.5)
Chloride: 101 mEq/L (ref 96–112)
Creatinine, Ser: 0.9 mg/dL (ref 0.4–1.2)
GFR: 66.19 mL/min (ref >60.00)
Glucose, Bld: 221 mg/dL — ABNORMAL HIGH (ref 70–99)
Potassium: 3.4 mEq/L — ABNORMAL LOW (ref 3.5–5.1)
Sodium: 138 mEq/L (ref 135–145)

## 2014-03-08 LAB — CBC WITH DIFFERENTIAL/PLATELET
Basophils Absolute: 0 10*3/uL (ref 0.0–0.1)
Basophils Relative: 0.4 % (ref 0.0–3.0)
Eosinophils Absolute: 0.1 10*3/uL (ref 0.0–0.7)
Eosinophils Relative: 1.5 % (ref 0.0–5.0)
HCT: 37.1 % (ref 36.0–46.0)
Hemoglobin: 12.1 g/dL (ref 12.0–15.0)
Lymphocytes Relative: 24.5 % (ref 12.0–46.0)
Lymphs Abs: 2 10*3/uL (ref 0.7–4.0)
MCHC: 32.7 g/dL (ref 30.0–36.0)
MCV: 84.9 fl (ref 78.0–100.0)
Monocytes Absolute: 0.5 10*3/uL (ref 0.1–1.0)
Monocytes Relative: 6.5 % (ref 3.0–12.0)
Neutro Abs: 5.4 10*3/uL (ref 1.4–7.7)
Neutrophils Relative %: 67.1 % (ref 43.0–77.0)
Platelets: 205 10*3/uL (ref 150.0–400.0)
RBC: 4.37 Mil/uL (ref 3.87–5.11)
RDW: 15.9 % — ABNORMAL HIGH (ref 11.5–14.6)
WBC: 8.1 10*3/uL (ref 4.5–10.5)

## 2014-03-08 LAB — PROTIME-INR
INR: 1.4 ratio — ABNORMAL HIGH (ref 0.8–1.0)
Prothrombin Time: 14.8 s — ABNORMAL HIGH (ref 10.2–12.4)

## 2014-03-08 NOTE — Telephone Encounter (Signed)
Called spoke with pt. Advised her did not have any samples at this time. Nothing further needed

## 2014-03-08 NOTE — Addendum Note (Signed)
Addended by: Dorothy Spark on: 03/08/2014 01:44 PM   Modules accepted: Orders

## 2014-03-11 ENCOUNTER — Encounter (HOSPITAL_COMMUNITY)
Admission: RE | Disposition: A | Discharge status: Home / Self Care | Payer: Self-pay | Source: Ambulatory Visit | Attending: Cardiology

## 2014-03-11 ENCOUNTER — Ambulatory Visit (HOSPITAL_COMMUNITY)
Admission: RE | Admit: 2014-03-11 | Discharge: 2014-03-11 | Disposition: A | Discharge status: Home / Self Care | Payer: Medicare Other | Source: Ambulatory Visit | Attending: Cardiology | Admitting: Cardiology

## 2014-03-11 ENCOUNTER — Encounter: Payer: Medicare Other | Admitting: Nurse Practitioner

## 2014-03-11 DIAGNOSIS — I10 Essential (primary) hypertension: Secondary | ICD-10-CM | POA: Insufficient documentation

## 2014-03-11 DIAGNOSIS — I2510 Atherosclerotic heart disease of native coronary artery without angina pectoris: Secondary | ICD-10-CM | POA: Insufficient documentation

## 2014-03-11 DIAGNOSIS — R002 Palpitations: Secondary | ICD-10-CM | POA: Insufficient documentation

## 2014-03-11 DIAGNOSIS — R0609 Other forms of dyspnea: Secondary | ICD-10-CM | POA: Insufficient documentation

## 2014-03-11 DIAGNOSIS — I4892 Unspecified atrial flutter: Secondary | ICD-10-CM | POA: Insufficient documentation

## 2014-03-11 DIAGNOSIS — Z951 Presence of aortocoronary bypass graft: Secondary | ICD-10-CM | POA: Insufficient documentation

## 2014-03-11 DIAGNOSIS — R9439 Abnormal result of other cardiovascular function study: Secondary | ICD-10-CM

## 2014-03-11 DIAGNOSIS — R0989 Other specified symptoms and signs involving the circulatory and respiratory systems: Secondary | ICD-10-CM | POA: Insufficient documentation

## 2014-03-11 HISTORY — PX: LEFT HEART CATHETERIZATION WITH CORONARY/GRAFT ANGIOGRAM: SHX5450

## 2014-03-11 LAB — POTASSIUM: Potassium: 3.9 mEq/L (ref 3.7–5.3)

## 2014-03-11 SURGERY — LEFT HEART CATHETERIZATION WITH CORONARY/GRAFT ANGIOGRAM

## 2014-03-11 MED ORDER — MIDAZOLAM HCL 2 MG/2ML IJ SOLN
INTRAMUSCULAR | Status: AC
  Filled 2014-03-11: qty 2

## 2014-03-11 MED ORDER — VERAPAMIL HCL 2.5 MG/ML IV SOLN
INTRAVENOUS | Status: AC
  Filled 2014-03-11: qty 2

## 2014-03-11 MED ORDER — HEPARIN (PORCINE) IN NACL 2-0.9 UNIT/ML-% IJ SOLN
INTRAMUSCULAR | Status: AC
Start: 2014-03-11 — End: 2014-03-11
  Filled 2014-03-11: qty 1500

## 2014-03-11 MED ORDER — LIDOCAINE HCL (PF) 1 % IJ SOLN
INTRAMUSCULAR | Status: AC
  Filled 2014-03-11: qty 30

## 2014-03-11 MED ORDER — SODIUM CHLORIDE 0.9 % IV SOLN: 1.0000 mL/kg/h | INTRAVENOUS | Status: DC

## 2014-03-11 MED ORDER — SODIUM CHLORIDE 0.9 % IJ SOLN: 3.0000 mL | INTRAMUSCULAR | Status: DC | PRN

## 2014-03-11 MED ORDER — SODIUM CHLORIDE 0.9 % IV SOLN
INTRAVENOUS | Status: DC
  Administered 2014-03-11: 50 mL/h via INTRAVENOUS

## 2014-03-11 MED ORDER — ASPIRIN 81 MG PO CHEW
81.0000 mg | CHEWABLE_TABLET | ORAL | Status: AC
  Administered 2014-03-11: 81 mg via ORAL
  Filled 2014-03-11: qty 1

## 2014-03-11 MED ORDER — FENTANYL CITRATE 0.05 MG/ML IJ SOLN
INTRAMUSCULAR | Status: AC
  Filled 2014-03-11: qty 2

## 2014-03-11 MED ORDER — SODIUM CHLORIDE 0.9 % IV SOLN: 250.0000 mL | INTRAVENOUS | Status: DC | PRN

## 2014-03-11 MED ORDER — NITROGLYCERIN 0.2 MG/ML ON CALL CATH LAB
INTRAVENOUS | Status: AC
  Filled 2014-03-11: qty 1

## 2014-03-11 MED ORDER — SODIUM CHLORIDE 0.9 % IJ SOLN: 3.0000 mL | Freq: Two times a day (BID) | INTRAMUSCULAR | Status: DC

## 2014-03-11 NOTE — H&P (View-Only) (Signed)
    Subjective:  No chest pain, dyspnea, or palpitations.  Objective:  Vital Signs in the last 24 hours: Temp:  [97.6 F (36.4 C)-98.4 F (36.9 C)] 98.4 F (36.9 C) (03/26 0300) Pulse Rate:  [75-87] 78 (03/26 0300) Resp:  [18] 18 (03/26 0300) BP: (122-142)/(51-65) 122/65 mmHg (03/26 0300) SpO2:  [94 %-97 %] 94 % (03/26 0756) Weight:  [69.3 kg (152 lb 12.5 oz)] 69.3 kg (152 lb 12.5 oz) (03/25 1017)  Intake/Output from previous day: 03/25 0701 - 03/26 0700 In: 840 [P.O.:840] Out: 1000 [Urine:1000]  Physical Exam: Pt is alert and oriented, NAD HEENT: normal Neck: JVP - normal, carotids 2+= without bruits Lungs: CTA bilaterally CV: RRR with grade 2/6 systolic murmur at the right upper sternal border Abd: soft, NT, Positive BS, no hepatomegaly Ext: no C/C/E, distal pulses intact and equal Skin: warm/dry no rash   Lab Results:  Recent Labs  02/19/14 1012  WBC 9.2  HGB 12.9  PLT 242.0    Recent Labs  02/19/14 1012 02/21/14 0330  NA 139 142  K 3.2* 3.9  CL 97 100  CO2 32 29  GLUCOSE 102* 122*  BUN 15 19  CREATININE 0.9 0.87   No results found for this basename: TROPONINI, CK, MB,  in the last 72 hours  Tele: Sinus rhythm with occasional PACs and PVCs  Assessment/Plan:  1. Paroxysmal atrial flutter with RVR 2. CAD status post CABG remotely 3. Hypertension  The patient has been started on Xarelto for anticoagulation. She has not had any atrial flutter since admission. She has also been started on metoprolol 25 mg twice daily. I think she can be discharged home this morning. Per Dr. Nelson's note, agree that Lexiscan Myoview should be obtained to evaluate for ischemia considering her remote CABG. Also will arrange a 30 day event monitor since the patient does not have typical symptoms of tachypalpitations. I think this will be important to assess the burden of her atrial flutter. Patient will followup with Dr. Nelson or her PA/NP in the office.  Amilia Vandenbrink,  M.D. 02/21/2014, 9:03 AM     

## 2014-03-11 NOTE — Discharge Instructions (Signed)

## 2014-03-11 NOTE — Interval H&P Note (Signed)
History and Physical Interval Note:  1/96/2229 7:98 PM  [name]  has presented today for surgery, with the diagnosis of Abnormal Myoview  The various methods of treatment have been discussed with the patient and family. After consideration of risks, benefits and other options for treatment, the patient has consented to  Procedure(s): LEFT HEART CATHETERIZATION WITH CORONARY ANGIOGRAM (N/A) as a surgical intervention .  The patient's history has been reviewed, patient examined, no change in status, stable for surgery.  I have reviewed the patient's chart and labs.  Questions were answered to the patient's satisfaction.   Cath Lab Visit (complete for each Cath Lab visit)  Clinical Evaluation Leading to the Procedure:   ACS: no  Non-ACS:    Anginal Classification: CCS III  Anti-ischemic medical therapy: Maximal Therapy (2 or more classes of medications)  Non-Invasive Test Results: High-risk stress test findings: cardiac mortality >3%/year  Prior CABG: Previous CABG        Ander Slade [facility] 03/11/2014 1:57 PM

## 2014-03-11 NOTE — Progress Notes (Signed)
UP AND WALKED AND TOL WELL; RIGHT GROIN STABLE; NO BLEEDING OR HEMATOMA 

## 2014-03-11 NOTE — CV Procedure (Signed)
   Cardiac Catheterization Procedure Note  Name: [name] MRN: [id number] DOB: 1941/03/19  Procedure: Left Heart Cath, Selective Coronary Angiography, LV angiography  Indication: 73 yo WF with history of CAD s/p remote CABG. She had recent chest pain. Myoview was abnormal demonstrating anterior wall ischemia. She has severe PAD. She is s/p left carotid to subclavian bypass for left subclavian occlusion. She is also s/p aortobifemoral bypass.    Procedural details: The right groin was prepped, draped, and anesthetized with 1% lidocaine. Using modified Seldinger technique, a 5 French sheath was introduced into the right femoral artery. Standard Judkins catheters were used for coronary angiography and left ventriculography. The left coronary was very difficult to engage due to left main ostial occlusion and heavy calcification. We upgraded to a long 6 Fr sheath for better support. With multiple catheters and guides we could not demonstrate any flow in the left main.  Catheter exchanges were performed over a guidewire. There were no immediate procedural complications. The patient was transferred to the post catheterization recovery area for further monitoring.  Procedural Findings: Hemodynamics:  AO 201/67 mean 120 mm Hg LV 215/15 mm Hg   Coronary angiography: Coronary dominance: right  Left mainstem: 100% occlusion at the ostium  Left anterior descending (LAD): Not visualized.  Left circumflex (LCx): Not visualized  Right coronary artery (RCA): 100% in the mid vessel.  The SVG to the OM is widely patent.  The SVG to the RCA is widely patent. There are some collaterals to the first septal perforator from the RCA.  Left ventriculography: Left ventricular systolic function is normal, LVEF is estimated at 6065%, there is no significant mitral regurgitation   Given her known left subclavian occlusion we next attempted to access the IMA graft from a left radial approach. The left  wrist was prepped and draped in a sterile fashion and was anesthetized with 1% lidocaine. Using ultrasound we attempted to access the left radial artery. The artery was small on ultrasound. Despite engaging the artery 3 times with the needle I was unable to pass the wire into the artery and access could not be obtained.  Final Conclusions:   1. 3 vessel occlusive CAD with left main occlusion. 2. Patent SVG to the RCA 3. Patent SVG to the OM 4. Normal LV function. 5. Unable to access left IMA graft due to inability to access the left radial artery.  Recommendations: Medical management. I would consider CTA to confirm IMA patency and assess functionality of the left carotid to subclavian bypass graft.   Ander Slade [facility] 03/11/2014, 3:19 PM

## 2014-03-12 ENCOUNTER — Other Ambulatory Visit: Payer: Self-pay

## 2014-03-12 ENCOUNTER — Telehealth: Payer: Self-pay

## 2014-03-12 MED ORDER — POTASSIUM CHLORIDE ER 10 MEQ PO TBCR: 10.0000 meq | EXTENDED_RELEASE_TABLET | Freq: Every day | ORAL | Status: DC

## 2014-03-12 MED ORDER — METOPROLOL TARTRATE 25 MG PO TABS: 50.0000 mg | ORAL_TABLET | Freq: Two times a day (BID) | ORAL | Status: DC

## 2014-03-12 MED ORDER — METOPROLOL SUCCINATE ER 50 MG PO TB24: 50.0000 mg | ORAL_TABLET | Freq: Every day | ORAL | Status: DC

## 2014-03-12 NOTE — Telephone Encounter (Signed)
Patient called to see who was going to schedule her cta scan

## 2014-03-12 NOTE — Telephone Encounter (Signed)
Patient called received message from Dr.Nelson potassium low.She advised to start Kdur 10 meq daily.Prescription sent to pharmacy.Advised to keep appointment with Dr.Nelson 03/27/14 at 11:30 am.

## 2014-03-13 ENCOUNTER — Telehealth: Payer: Self-pay | Admitting: Critical Care Medicine

## 2014-03-13 NOTE — Telephone Encounter (Signed)
Called made pt aware no samples of spiriva at this time, she will check next week.

## 2014-03-15 ENCOUNTER — Telehealth: Payer: Self-pay

## 2014-03-15 DIAGNOSIS — I25810 Atherosclerosis of coronary artery bypass graft(s) without angina pectoris: Secondary | ICD-10-CM

## 2014-03-15 NOTE — Telephone Encounter (Signed)
**Note De-Identified Ledford Goodson Obfuscation** LMTCB.  We received 2 calls from [facility] today (one at 11 am and the other at4 pm) stating that the pt went in to A-fib both times with a 170 bpm HR. 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 Per Dr Meda Coffee the pt will be advised to start taking Cardizem CD 120 mg daily and to have a coronary CTA done to evaluate graft vessels.

## 2014-03-18 ENCOUNTER — Telehealth: Payer: Self-pay | Admitting: Critical Care Medicine

## 2014-03-18 DIAGNOSIS — J449 Chronic obstructive pulmonary disease, unspecified: Secondary | ICD-10-CM

## 2014-03-18 MED ORDER — DILTIAZEM HCL ER COATED BEADS 120 MG PO CP24: 120.0000 mg | ORAL_CAPSULE | Freq: Every day | ORAL | Status: DC

## 2014-03-18 MED ORDER — TIOTROPIUM BROMIDE MONOHYDRATE 18 MCG IN CAPS: 18.0000 ug | ORAL_CAPSULE | Freq: Every day | RESPIRATORY_TRACT | Status: DC

## 2014-03-18 NOTE — Telephone Encounter (Signed)
Called spoke with pt. Aware samples left for pick up. Nothing further needed

## 2014-03-18 NOTE — Telephone Encounter (Signed)
The pt is advised, she verbalized understanding and agrees with the plan. RX sent to [facility] to fill per the pts request. Coronary CT has been ordered and a message sent to [facility] to call pt to schedule date and time of CT.

## 2014-03-18 NOTE — Telephone Encounter (Signed)
**Note De-identified [name] Obfuscation** LMTCB

## 2014-03-20 ENCOUNTER — Other Ambulatory Visit: Payer: Self-pay | Admitting: *Deleted

## 2014-03-20 DIAGNOSIS — I471 Supraventricular tachycardia: Secondary | ICD-10-CM

## 2014-03-21 ENCOUNTER — Telehealth: Payer: Self-pay | Admitting: Cardiology

## 2014-03-21 ENCOUNTER — Other Ambulatory Visit: Payer: Self-pay | Admitting: *Deleted

## 2014-03-21 ENCOUNTER — Telehealth: Payer: Self-pay | Admitting: *Deleted

## 2014-03-21 ENCOUNTER — Telehealth: Payer: Self-pay | Admitting: Internal Medicine

## 2014-03-21 MED ORDER — RIVAROXABAN 20 MG PO TABS: 20.0000 mg | ORAL_TABLET | Freq: Every day | ORAL | Status: DC

## 2014-03-21 NOTE — Telephone Encounter (Signed)
[facility] reports show A Fib at 0400 this morning, rate 140-169. No new orders received. Addressed adding anticoagulation this morning.

## 2014-03-21 NOTE — Telephone Encounter (Signed)
[facility] reports (3) from 03/20/14:  3:04 pm showing A Flutter w/ PVC, rate 150  4:29 pm showing A Fib w/ PVC, rate 180  4:37 pm showing A Fib w/ PVC, rate 150 Discussed these with Dr. Meda Coffee, whom spoke with the patient yesterday via phone. Order to start Xarelto 20 mg daily. (Haven't sent order until speaking with pt and getting pharm info)  Left message on personal answering machine advising pt to call office back.

## 2014-03-21 NOTE — Telephone Encounter (Signed)
Returned pt call. Advised to start Xarelto 20 mg daily per Dr. Meda Coffee.  Rx sent to [place].  Samples/card left at front desk for pt pick up. Patient verbalized understanding and agreeable to plan.

## 2014-03-21 NOTE — Telephone Encounter (Signed)
A user error has taken place: encounter opened in error, closed for administrative reasons.

## 2014-03-21 NOTE — Telephone Encounter (Signed)
New message     Pt said she picked up a sample of medication today to try----when she got home and looked at it---it is the same medication she is already on (xarelto).  Want to talk to a nurse.  After talking to her further, it was actually Dr Meda Coffee that left the samples not Dr Lovena Le.

## 2014-03-21 NOTE — Telephone Encounter (Signed)
Spoke with [facility]--reports aflutter at 160 bpm.  This transmission was at 2:04 CST. Attempted to call patient.  No answer. Spoke with Venida Jarvis, RN who talked to patient and Dr. Meda Coffee today about this. Dr. Meda Coffee ordered xarelto and has ordered coronary CT. Reviewed at this time with Dr. Meda Coffee as well.

## 2014-03-21 NOTE — Telephone Encounter (Signed)
New message     [facility] calling with a serious transmission

## 2014-03-21 NOTE — Telephone Encounter (Signed)
New message ° ° ° ° ° ° ° ° ° °Pt returning nurses call °

## 2014-03-22 ENCOUNTER — Telehealth: Payer: Self-pay

## 2014-03-22 NOTE — Telephone Encounter (Signed)
Called this patient to check on her due to a notification from [place]. Patient states she was unaware of this event at 0201 this am, she was asleep.She has already been started on anticoag as of yesterday by Dr. Meda Coffee (see yesterday's note.) Pt. states she feels ok today, just tired. Pt. advised to seek medical attention sooner if needed. I will check on status of CT ordered yesterday.

## 2014-03-25 ENCOUNTER — Ambulatory Visit (INDEPENDENT_AMBULATORY_CARE_PROVIDER_SITE_OTHER): Payer: Medicare Other | Admitting: Cardiology

## 2014-03-25 ENCOUNTER — Telehealth: Payer: Self-pay | Admitting: Internal Medicine

## 2014-03-25 VITALS — BP 100/50 | HR 77 | Ht 62.5 in | Wt 156.0 lb

## 2014-03-25 DIAGNOSIS — I350 Nonrheumatic aortic (valve) stenosis: Secondary | ICD-10-CM

## 2014-03-25 DIAGNOSIS — I739 Peripheral vascular disease, unspecified: Secondary | ICD-10-CM

## 2014-03-25 DIAGNOSIS — I4892 Unspecified atrial flutter: Secondary | ICD-10-CM

## 2014-03-25 DIAGNOSIS — D649 Anemia, unspecified: Secondary | ICD-10-CM

## 2014-03-25 DIAGNOSIS — E785 Hyperlipidemia, unspecified: Secondary | ICD-10-CM

## 2014-03-25 DIAGNOSIS — I25810 Atherosclerosis of coronary artery bypass graft(s) without angina pectoris: Secondary | ICD-10-CM

## 2014-03-25 DIAGNOSIS — I498 Other specified cardiac arrhythmias: Secondary | ICD-10-CM

## 2014-03-25 DIAGNOSIS — I679 Cerebrovascular disease, unspecified: Secondary | ICD-10-CM

## 2014-03-25 DIAGNOSIS — I10 Essential (primary) hypertension: Secondary | ICD-10-CM

## 2014-03-25 DIAGNOSIS — I359 Nonrheumatic aortic valve disorder, unspecified: Secondary | ICD-10-CM

## 2014-03-25 DIAGNOSIS — I471 Supraventricular tachycardia: Secondary | ICD-10-CM

## 2014-03-25 MED ORDER — PROMETHAZINE HCL 25 MG PO TABS: 25.0000 mg | ORAL_TABLET | Freq: Four times a day (QID) | ORAL | Status: DC | PRN

## 2014-03-25 MED ORDER — HYDROCODONE-ACETAMINOPHEN 5-325 MG PO TABS: 0.5000 | ORAL_TABLET | Freq: Four times a day (QID) | ORAL | Status: DC | PRN

## 2014-03-25 MED ORDER — FUROSEMIDE 40 MG PO TABS: 40.0000 mg | ORAL_TABLET | Freq: Every day | ORAL | Status: DC

## 2014-03-25 MED ORDER — METOPROLOL TARTRATE 50 MG PO TABS: 50.0000 mg | ORAL_TABLET | Freq: Two times a day (BID) | ORAL | Status: DC

## 2014-03-25 NOTE — Telephone Encounter (Signed)
Okay to refill Hydrocodone and Phenergan?

## 2014-03-25 NOTE — Telephone Encounter (Signed)
Pt is needing new rx hydrocodone ascetaminophen 5-325 and promethazine 25 mg, please call when available for pick up.

## 2014-03-25 NOTE — Telephone Encounter (Signed)
Pt notified Rx's ready for pickup. Rx printed and signed.

## 2014-03-25 NOTE — Progress Notes (Signed)
Patient ID: [name], female   DOB: 1941-02-07, 73 y.o.   MRN: [id number]    Patient Name: [name] Date of Encounter: 03/25/2014  Primary Care Provider:  Nyoka Cowden, MD Primary Cardiologist:  Dorothy Spark  Problem List   Past Medical History  Diagnosis Date  . ANEMIA 08/28/2010  . CAD (coronary artery disease)     a. s/p CABGx4 in 2001.  Marland Kitchen COLONIC POLYPS, HX OF 06/15/2007  . COPD (chronic obstructive pulmonary disease)   . Depressive disorder   . GERD 12/19/2009  . Hyperlipidemia   . Hypertension   . HYPOTENSION 08/11/2010  . Osteoarthritis   . PVD (peripheral vascular disease)     a. Duplex 03/2013: stable moderate carotid dz - 76-54% RICA, 65-03% LICA, L subclavian artery to CCA stent widely patent.  b. Prior R fempop bypass graft, R CIA stent.  . Lumbar stenosis   . Lung abscess 2011  . CHF (congestive heart failure)   . Hx of colonoscopy   . Paroxysmal atrial flutter     a. Dx 01/2014, placed on Xarelto.  . Aortic stenosis     a. Mild by echo 01/2013.   Past Surgical History  Procedure Laterality Date  . L subclavian bypass  07/2004  . Tubal ligation    . Coronary artery bypass graft  05/2000  . Femoral popliteal bypass-r  04/1999  . Right common iliac pta with stent placement  07/2000  . Right leg blockage  2003  . Aorta bifemoral bypass graft  2004  . Left cea  2005  . Lung cyst biopsy  2011   Allergies  Allergies  Allergen Reactions  . Codeine Phosphate     REACTION: unspecified   HPI  [name] is a 73 year old female with h/o CAD, CABG in 2001 and carotid disease who was previously followed by Dr Verl Blalock. She was doing really well since the CABG until March of this year when she presented with paroxymal  dizziness and weakness. She also complained of exertional shortness of breath and fatigue. She denied chest pain, orthopnea paroxysmal nocturnal dyspnea or lower extremity edema. She denies any syncope. She was diagnosed with SVT,  possibly 2:1 flutter with ventricular rate 160 BPM (ECG on 02/19/2014). She was admitted to the hospital the next day for TEE/CV but was found to be in SR already.  She underwent a Lexiscan nuclear stress test that showed anterior ischemia. The patient had a cardiac cath that showed 3 VD with the SVG to the OM and SVG to the RCA widely patent. Because of known left subclavian occlusion ( s/p left subclavian to carotid bypass) an attempt to access the IMA graft from a right radial approach was tried but was unsuccessful. The artery was small on ultrasound. The patient is scheduled for a CTA to confirm IMA patency and assess functionality of the left carotid to subclavian bypass graft.  Left ventricular systolic function is normal, LVEF is estimated at 60 - 65%, there is no significant mitral regurgitation.  Meanwhile she was wearing e-cardio monitor, she was found to have at least 6 episodes of SVT, some of then regular, some irregular with HR 160 - 190 BPM. The patient denies palpitations of syncope, however she felt profound fatigue.  Home Medications  Prior to Admission medications   Medication Sig Start Date End Date Taking? Authorizing Provider  acetaminophen (TYLENOL) 325 MG tablet Take 650 mg by mouth every 6 (six) hours as needed.  Historical Provider, MD  albuterol (PROVENTIL HFA;VENTOLIN HFA) 108 (90 BASE) MCG/ACT inhaler Inhale 2 puffs into the lungs every 6 (six) hours as needed for wheezing or shortness of breath.    Historical Provider, MD  atorvastatin (LIPITOR) 40 MG tablet Take 1 tablet (40 mg total) by mouth every evening. 02/21/14   Dayna N Dunn, PA-C  cilostazol (PLETAL) 100 MG tablet Take 100 mg by mouth 2 (two) times daily.    Historical Provider, MD  cilostazol (PLETAL) 100 MG tablet TAKE 1 TABLET BY MOUTH 2 TIMES A DAY 02/27/14   Marletta Lor, MD  diltiazem (CARDIZEM CD) 120 MG 24 hr capsule Take 1 capsule (120 mg total) by mouth daily.    Dorothy Spark, MD    gabapentin (NEURONTIN) 600 MG tablet Take 1 tablet (600 mg total) by mouth 3 (three) times daily. 07/23/13   Marletta Lor, MD  hydrochlorothiazide (HYDRODIURIL) 25 MG tablet Take 25 mg by mouth daily.  09/03/13   Historical Provider, MD  HYDROcodone-acetaminophen (NORCO/VICODIN) 5-325 MG per tablet Take 1 tablet by mouth every 6 (six) hours as needed for moderate pain.    Historical Provider, MD  LORazepam (ATIVAN) 0.5 MG tablet Take 0.5 mg by mouth 2 (two) times daily as needed for anxiety.    Historical Provider, MD  losartan (COZAAR) 25 MG tablet Take 25 mg by mouth daily.    Historical Provider, MD  metoprolol tartrate (LOPRESSOR) 25 MG tablet Take 2 tablets (50 mg total) by mouth 2 (two) times daily. 03/12/14   Dorothy Spark, MD  nitroGLYCERIN (NITROSTAT) 0.4 MG SL tablet Place 0.4 mg under the tongue every 5 (five) minutes as needed. 08/13/11   Renella Cunas, MD  omeprazole (PRILOSEC) 20 MG capsule Take 20 mg by mouth daily.    Historical Provider, MD  potassium chloride (K-DUR) 10 MEQ tablet Take 1 tablet (10 mEq total) by mouth daily. 03/12/14   Peter M Martinique, MD  promethazine (PHENERGAN) 25 MG tablet Take 25 mg by mouth every 6 (six) hours as needed for nausea or vomiting.    Historical Provider, MD  rivaroxaban (XARELTO) 20 MG TABS tablet Take 1 tablet (20 mg total) by mouth daily with supper. 03/21/14   Dorothy Spark, MD  sertraline (ZOLOFT) 50 MG tablet Take 25 mg by mouth daily.    Historical Provider, MD  therapeutic multivitamin-minerals (THERAGRAN-M) tablet Take 1 tablet by mouth daily.    Historical Provider, MD  tiotropium (SPIRIVA HANDIHALER) 18 MCG inhalation capsule Place 1 capsule (18 mcg total) into inhaler and inhale daily. 03/18/14 03/18/15  Elsie Stain, MD  trolamine salicylate (ASPERCREME) 10 % cream Apply 1 application topically as needed for muscle pain.    Historical Provider, MD    Family History  Family History  Problem Relation Age of Onset  .  Diabetes Father   . Heart attack Mother   . Diabetes    . Coronary artery disease    . Colon cancer Neg Hx     Social History  History   Social History  . Marital Status: Widowed    Spouse Name: N/A    Number of Children: 2  . Years of Education: N/A   Occupational History  .     Social History Main Topics  . Smoking status: Former Smoker -- 1.50 packs/day for 40 years    Types: Cigarettes    Quit date: 06/29/2000  . Smokeless tobacco: Never Used  . Alcohol Use: No  .  Drug Use: No  . Sexual Activity: Not on file   Other Topics Concern  . Not on file   Social History Narrative  . No narrative on file     Review of Systems, as per HPI, otherwise negative General:  No chills, fever, night sweats or weight changes.  Cardiovascular:  No chest pain, dyspnea on exertion, edema, orthopnea, palpitations, paroxysmal nocturnal dyspnea. Dermatological: No rash, lesions/masses Respiratory: No cough, dyspnea Urologic: No hematuria, dysuria Abdominal:   No nausea, vomiting, diarrhea, bright red blood per rectum, melena, or hematemesis Neurologic:  No visual changes, wkns, changes in mental status. All other systems reviewed and are otherwise negative except as noted above.  Physical Exam  There were no vitals taken for this visit.  General: Pleasant, NAD Psych: Normal affect. Neuro: Alert and oriented X 3. Moves all extremities spontaneously. HEENT: Normal  Neck: Supple without bruits or JVD. Lungs:  Resp regular and unlabored, CTA. Heart: RRR no s3, s4, or murmurs. Abdomen: Soft, non-tender, non-distended, BS + x 4.  Extremities: No clubbing, cyanosis or edema. DP/PT/Radials 2+ and equal bilaterally.  Labs:  No results found for this basename: CKTOTAL, CKMB, TROPONINI,  in the last 72 hours Lab Results  Component Value Date   WBC 8.1 03/08/2014   HGB 12.1 03/08/2014   HCT 37.1 03/08/2014   MCV 84.9 03/08/2014   PLT 205.0 03/08/2014       Component Value Date/Time     NA 138 03/08/2014 1011   K 3.9 03/11/2014 1003   CL 101 03/08/2014 1011   CO2 27 03/08/2014 1011   GLUCOSE 221* 03/08/2014 1011   GLUCOSE 91 10/31/2006 0948   BUN 17 03/08/2014 1011   CREATININE 0.9 03/08/2014 1011   CALCIUM 10.1 03/08/2014 1011   PROT 7.5 02/19/2014 1012   ALBUMIN 4.1 02/19/2014 1012   AST 25 02/19/2014 1012   ALT 19 02/19/2014 1012   ALKPHOS 72 02/19/2014 1012   BILITOT 0.4 02/19/2014 1012   GFRNONAA 65* 02/21/2014 0330   GFRAA 75* 02/21/2014 0330   Lab Results  Component Value Date   CHOL 155 10/02/2013   HDL 54.40 10/02/2013   LDLCALC 64 09/18/2012   TRIG 215.0* 10/02/2013    Accessory Clinical Findings  Echocardiogram 01/2013  - Left ventricle: The cavity size was normal. Wall thickness was increased in a pattern of mild LVH. Systolic function was normal. The estimated ejection fraction was in the range of 55% to 60%. Wall motion was normal; there were no regional wall motion abnormalities. Doppler parameters are consistent with abnormal left ventricular relaxation (grade 1 diastolic dysfunction). - Aortic valve: There was mild stenosis. Mean gradient: 61mm Hg (S). Peak gradient: 10mm Hg (S). - Mitral valve: Calcified annulus. - Left atrium: The atrium was mildly dilated. - Pulmonary arteries: Systolic pressure was mildly increased. PA peak pressure: 50mm Hg (S). Impressions: - Calcified aortic valve with mildly reduced cusp excursion; mild AS by doppler. There is note of elevated gradient from suprasternal notch most likely from right innominate artery (5 m/s); suggest dopplers to further assess.  ECG - sinus rhythm with PACs, 77 beats per minute otherwise normal EKG.   Cardiac catheterization : 03/11/2014  Coronary angiography:  Coronary dominance: right  Left mainstem: 100% occlusion at the ostium  Left anterior descending (LAD): Not visualized.  Left circumflex (LCx): Not visualized  Right coronary artery (RCA): 100% in the mid vessel.  The SVG to  the OM is widely patent.  The SVG to the  RCA is widely patent. There are some collaterals to the first septal perforator from the RCA.   Left ventriculography: Left ventricular systolic function is normal, LVEF is estimated at 60 - 65%, there is no significant mitral regurgitation   Given her known left subclavian occlusion we next attempted to access the IMA graft from a right radial approach but unsuccessful. The artery was small on ultrasound.  Recommendations: Medical management. I would consider CTA to confirm IMA patency and assess functionality of the left carotid to subclavian bypass graft.     Assessment & Plan  73 year old female with paroxysmal SVT and DOE, abnormal stress test  1. CAD, s/p CABG in 2001, anterior ischemia on Lexiscan stress test on 03/07/14, cath with 3 occluded vessels, patient SVG to OM and RCA, LIMA not images as chronic left subclavian occlusion, s/p subclavian to carotid bypass and small right radial vessel. CTA to assess LIMA patency scheduled for 04/02/14.  2. SVT - possibly a-fib and a-flutter with 2:1 block, ventricular rate 160-190 BPM. Referred to Dr Lovena Le on 04/04/14. By then we will have results from CTA. Continue Metoprolol, diltiazem and xarelto.  3. PAD - carotid disease - [id number] RICA, [id number] LICA, patent left subclavian to CCA graft, antegrade flow in vertebral arteries. We will repeat carotid US now. S/P right fem-pop BP, no claudications, on cilostazol.  We will follow after the CTA.   Dorothy Spark, MD, [facility] 03/25/2014, 11:01 AM

## 2014-03-25 NOTE — Telephone Encounter (Signed)
ok 

## 2014-03-25 NOTE — Patient Instructions (Signed)
START TAKING METOPROLOL 50 MG TWO TIMES DAILY   START TAKING LASIX (FUROSEMIDE) 40 MG DAILY  STOP TAKING FELODIPINE   Your physician recommends that you schedule a follow-up appointment in: [place]

## 2014-03-27 ENCOUNTER — Ambulatory Visit: Payer: Medicare Other | Admitting: Cardiology

## 2014-03-27 ENCOUNTER — Encounter: Payer: Self-pay | Admitting: Cardiology

## 2014-03-29 ENCOUNTER — Telehealth: Payer: Self-pay | Admitting: Cardiology

## 2014-03-29 ENCOUNTER — Other Ambulatory Visit: Payer: Self-pay | Admitting: *Deleted

## 2014-03-29 DIAGNOSIS — I739 Peripheral vascular disease, unspecified: Secondary | ICD-10-CM

## 2014-03-29 NOTE — Telephone Encounter (Signed)
New message     Pt said she was returning a nurse's call.   Did you call this patient?

## 2014-03-29 NOTE — Telephone Encounter (Signed)
Placed order for pt to get a b/l carotid US for PAD per Dr Meda Coffee Message sent to [facility] pool to have this scheduled.  Pt aware of new order and verbalized understanding.

## 2014-03-29 NOTE — Telephone Encounter (Signed)
Message copied by Nuala Alpha on Fri Mar 29, 2014  4:10 PM ------      Message from: Dorothy Spark      Created: Fri Mar 29, 2014 12:07 AM       Could you schedule a B/L carotid US for this patient? Dg: PAD      Thank you,      K ------

## 2014-03-29 NOTE — Progress Notes (Signed)
A B/L carotid US ordered on pt per Dr Meda Coffee for PAD-443.9 Will notify schedulers to arrange this.  Order in epic.

## 2014-04-01 ENCOUNTER — Ambulatory Visit: Payer: Medicare Other | Admitting: Internal Medicine

## 2014-04-01 ENCOUNTER — Other Ambulatory Visit (HOSPITAL_COMMUNITY): Payer: Self-pay | Admitting: Cardiology

## 2014-04-01 DIAGNOSIS — I6529 Occlusion and stenosis of unspecified carotid artery: Secondary | ICD-10-CM

## 2014-04-02 ENCOUNTER — Other Ambulatory Visit: Payer: Self-pay

## 2014-04-02 ENCOUNTER — Ambulatory Visit (HOSPITAL_COMMUNITY)
Admission: RE | Admit: 2014-04-02 | Discharge: 2014-04-02 | Disposition: A | Discharge status: Home / Self Care | Payer: Medicare Other | Source: Ambulatory Visit | Attending: Cardiology | Admitting: Cardiology

## 2014-04-02 DIAGNOSIS — I498 Other specified cardiac arrhythmias: Secondary | ICD-10-CM | POA: Insufficient documentation

## 2014-04-02 DIAGNOSIS — I471 Supraventricular tachycardia: Secondary | ICD-10-CM

## 2014-04-02 MED ORDER — METOPROLOL TARTRATE 1 MG/ML IV SOLN
5.0000 mg | Freq: Once | INTRAVENOUS | Status: AC
  Administered 2014-04-02: 5 mg via INTRAVENOUS
  Filled 2014-04-02: qty 5

## 2014-04-02 MED ORDER — METOPROLOL TARTRATE 1 MG/ML IV SOLN
INTRAVENOUS | Status: AC
  Administered 2014-04-02: 5 mg via INTRAVENOUS
  Filled 2014-04-02: qty 5

## 2014-04-02 NOTE — Progress Notes (Signed)
Pts CT heart has to be rescheduled per CT. There is a problem with scanner, MD ware. Pt to reschedule test.

## 2014-04-03 ENCOUNTER — Ambulatory Visit (HOSPITAL_COMMUNITY): Payer: Medicare Other | Attending: Cardiology | Admitting: Cardiology

## 2014-04-03 DIAGNOSIS — I739 Peripheral vascular disease, unspecified: Secondary | ICD-10-CM | POA: Insufficient documentation

## 2014-04-03 DIAGNOSIS — I6529 Occlusion and stenosis of unspecified carotid artery: Secondary | ICD-10-CM

## 2014-04-03 DIAGNOSIS — I771 Stricture of artery: Secondary | ICD-10-CM

## 2014-04-03 DIAGNOSIS — E785 Hyperlipidemia, unspecified: Secondary | ICD-10-CM | POA: Insufficient documentation

## 2014-04-03 DIAGNOSIS — J449 Chronic obstructive pulmonary disease, unspecified: Secondary | ICD-10-CM | POA: Insufficient documentation

## 2014-04-03 DIAGNOSIS — F17200 Nicotine dependence, unspecified, uncomplicated: Secondary | ICD-10-CM | POA: Insufficient documentation

## 2014-04-03 DIAGNOSIS — Z951 Presence of aortocoronary bypass graft: Secondary | ICD-10-CM | POA: Insufficient documentation

## 2014-04-03 DIAGNOSIS — I10 Essential (primary) hypertension: Secondary | ICD-10-CM | POA: Insufficient documentation

## 2014-04-03 DIAGNOSIS — Z9889 Other specified postprocedural states: Secondary | ICD-10-CM | POA: Insufficient documentation

## 2014-04-03 DIAGNOSIS — I708 Atherosclerosis of other arteries: Secondary | ICD-10-CM | POA: Insufficient documentation

## 2014-04-03 DIAGNOSIS — I2510 Atherosclerotic heart disease of native coronary artery without angina pectoris: Secondary | ICD-10-CM | POA: Insufficient documentation

## 2014-04-03 DIAGNOSIS — J4489 Other specified chronic obstructive pulmonary disease: Secondary | ICD-10-CM | POA: Insufficient documentation

## 2014-04-03 DIAGNOSIS — I658 Occlusion and stenosis of other precerebral arteries: Secondary | ICD-10-CM | POA: Insufficient documentation

## 2014-04-03 NOTE — Progress Notes (Signed)
Carotid duplex complete 

## 2014-04-04 ENCOUNTER — Institutional Professional Consult (permissible substitution): Payer: Medicare Other | Admitting: Internal Medicine

## 2014-04-04 ENCOUNTER — Ambulatory Visit (HOSPITAL_COMMUNITY): Admission: RE | Admit: 2014-04-04 | Payer: Medicare Other | Source: Ambulatory Visit

## 2014-04-04 ENCOUNTER — Telehealth: Payer: Self-pay | Admitting: Internal Medicine

## 2014-04-08 NOTE — Telephone Encounter (Signed)
Spoke with pt in regards to her CTA Heart/pulm vein that is scheduled for 04/12/14 at 2:00pm. Pt stated a scheduler from our office already notified her of this appointment.  Pt pleased with the prompt follow-up from all parties and verbalized understanding.

## 2014-04-08 NOTE — Telephone Encounter (Signed)
Pt contacting us due to cta heart/pulm vein appt was cancelled on 04/04/14. Cancel notes say it was cancelled by provider Tommy (ct tech), because the scanner was down.  Pt is calling today because nobody has called her back to reschedule this test.  Told pt we were not aware of the cancellation, but will definitely follow-up why nobody has contacted her to reschedule. Made our [facility] schedulers aware of this and they state nobody from the hospital contacted them as well. [facility] schedulers are going to follow-up on this matter and get this scheduled.  We will notify the pt when this appointment has been made.  Made pt aware of this and she verbalized understanding and pleased with the follow-up.

## 2014-04-08 NOTE — Telephone Encounter (Signed)
New message   Patient calling back regarding CT scan that was cancel at [facility] on  5/7 .

## 2014-04-09 ENCOUNTER — Encounter (HOSPITAL_COMMUNITY): Payer: Self-pay | Admitting: Emergency Medicine

## 2014-04-09 ENCOUNTER — Inpatient Hospital Stay (HOSPITAL_COMMUNITY)
Admission: EM | Admit: 2014-04-09 | Discharge: 2014-04-13 | DRG: 065 | Disposition: A | Discharge status: Home / Self Care | Payer: Medicare Other | Attending: Internal Medicine | Admitting: Internal Medicine

## 2014-04-09 ENCOUNTER — Emergency Department (HOSPITAL_COMMUNITY): Payer: Medicare Other

## 2014-04-09 ENCOUNTER — Telehealth: Payer: Self-pay | Admitting: Internal Medicine

## 2014-04-09 DIAGNOSIS — I6529 Occlusion and stenosis of unspecified carotid artery: Secondary | ICD-10-CM | POA: Diagnosis present

## 2014-04-09 DIAGNOSIS — I2510 Atherosclerotic heart disease of native coronary artery without angina pectoris: Secondary | ICD-10-CM | POA: Diagnosis present

## 2014-04-09 DIAGNOSIS — E876 Hypokalemia: Secondary | ICD-10-CM

## 2014-04-09 DIAGNOSIS — D509 Iron deficiency anemia, unspecified: Secondary | ICD-10-CM | POA: Diagnosis present

## 2014-04-09 DIAGNOSIS — I5032 Chronic diastolic (congestive) heart failure: Secondary | ICD-10-CM | POA: Diagnosis present

## 2014-04-09 DIAGNOSIS — I10 Essential (primary) hypertension: Secondary | ICD-10-CM

## 2014-04-09 DIAGNOSIS — F3289 Other specified depressive episodes: Secondary | ICD-10-CM

## 2014-04-09 DIAGNOSIS — I471 Supraventricular tachycardia, unspecified: Secondary | ICD-10-CM

## 2014-04-09 DIAGNOSIS — K219 Gastro-esophageal reflux disease without esophagitis: Secondary | ICD-10-CM

## 2014-04-09 DIAGNOSIS — E119 Type 2 diabetes mellitus without complications: Secondary | ICD-10-CM

## 2014-04-09 DIAGNOSIS — I25810 Atherosclerosis of coronary artery bypass graft(s) without angina pectoris: Secondary | ICD-10-CM

## 2014-04-09 DIAGNOSIS — J449 Chronic obstructive pulmonary disease, unspecified: Secondary | ICD-10-CM | POA: Diagnosis present

## 2014-04-09 DIAGNOSIS — I679 Cerebrovascular disease, unspecified: Secondary | ICD-10-CM

## 2014-04-09 DIAGNOSIS — I4891 Unspecified atrial fibrillation: Secondary | ICD-10-CM

## 2014-04-09 DIAGNOSIS — R55 Syncope and collapse: Secondary | ICD-10-CM

## 2014-04-09 DIAGNOSIS — M1990 Unspecified osteoarthritis, unspecified site: Secondary | ICD-10-CM

## 2014-04-09 DIAGNOSIS — Z7901 Long term (current) use of anticoagulants: Secondary | ICD-10-CM

## 2014-04-09 DIAGNOSIS — I350 Nonrheumatic aortic (valve) stenosis: Secondary | ICD-10-CM

## 2014-04-09 DIAGNOSIS — D649 Anemia, unspecified: Secondary | ICD-10-CM

## 2014-04-09 DIAGNOSIS — I4892 Unspecified atrial flutter: Secondary | ICD-10-CM

## 2014-04-09 DIAGNOSIS — N179 Acute kidney failure, unspecified: Secondary | ICD-10-CM | POA: Diagnosis present

## 2014-04-09 DIAGNOSIS — Z7982 Long term (current) use of aspirin: Secondary | ICD-10-CM

## 2014-04-09 DIAGNOSIS — Z860100 Personal history of colon polyps, unspecified: Secondary | ICD-10-CM

## 2014-04-09 DIAGNOSIS — Z951 Presence of aortocoronary bypass graft: Secondary | ICD-10-CM

## 2014-04-09 DIAGNOSIS — F329 Major depressive disorder, single episode, unspecified: Secondary | ICD-10-CM | POA: Diagnosis present

## 2014-04-09 DIAGNOSIS — E785 Hyperlipidemia, unspecified: Secondary | ICD-10-CM

## 2014-04-09 DIAGNOSIS — I739 Peripheral vascular disease, unspecified: Secondary | ICD-10-CM

## 2014-04-09 DIAGNOSIS — R609 Edema, unspecified: Secondary | ICD-10-CM

## 2014-04-09 DIAGNOSIS — I959 Hypotension, unspecified: Secondary | ICD-10-CM | POA: Diagnosis present

## 2014-04-09 DIAGNOSIS — Z79899 Other long term (current) drug therapy: Secondary | ICD-10-CM

## 2014-04-09 DIAGNOSIS — I639 Cerebral infarction, unspecified: Secondary | ICD-10-CM

## 2014-04-09 DIAGNOSIS — I6340 Cerebral infarction due to embolism of unspecified cerebral artery: Principal | ICD-10-CM | POA: Diagnosis present

## 2014-04-09 DIAGNOSIS — J4489 Other specified chronic obstructive pulmonary disease: Secondary | ICD-10-CM

## 2014-04-09 DIAGNOSIS — I509 Heart failure, unspecified: Secondary | ICD-10-CM | POA: Diagnosis present

## 2014-04-09 DIAGNOSIS — Z87891 Personal history of nicotine dependence: Secondary | ICD-10-CM

## 2014-04-09 DIAGNOSIS — H3400 Transient retinal artery occlusion, unspecified eye: Secondary | ICD-10-CM | POA: Diagnosis present

## 2014-04-09 LAB — CBC WITH DIFFERENTIAL/PLATELET
BASOS PCT: 0 % (ref 0–1)
Basophils Absolute: 0 10*3/uL (ref 0.0–0.1)
EOS ABS: 0.1 10*3/uL (ref 0.0–0.7)
Eosinophils Relative: 1 % (ref 0–5)
HEMATOCRIT: 33.8 % — AB (ref 36.0–46.0)
HEMOGLOBIN: 10.8 g/dL — AB (ref 12.0–15.0)
LYMPHS ABS: 2.2 10*3/uL (ref 0.7–4.0)
Lymphocytes Relative: 30 % (ref 12–46)
MCH: 26.8 pg (ref 26.0–34.0)
MCHC: 32 g/dL (ref 30.0–36.0)
MCV: 83.9 fL (ref 78.0–100.0)
MONO ABS: 0.5 10*3/uL (ref 0.1–1.0)
MONOS PCT: 7 % (ref 3–12)
NEUTROS PCT: 62 % (ref 43–77)
Neutro Abs: 4.7 10*3/uL (ref 1.7–7.7)
Platelets: 188 10*3/uL (ref 150–400)
RBC: 4.03 MIL/uL (ref 3.87–5.11)
RDW: 15.8 % — ABNORMAL HIGH (ref 11.5–15.5)
WBC: 7.6 10*3/uL (ref 4.0–10.5)

## 2014-04-09 LAB — COMPREHENSIVE METABOLIC PANEL
ALBUMIN: 3.8 g/dL (ref 3.5–5.2)
ALK PHOS: 83 U/L (ref 39–117)
ALT: 16 U/L (ref 0–35)
AST: 21 U/L (ref 0–37)
BUN: 30 mg/dL — AB (ref 6–23)
CO2: 26 mEq/L (ref 19–32)
CREATININE: 1.33 mg/dL — AB (ref 0.50–1.10)
Calcium: 11.2 mg/dL — ABNORMAL HIGH (ref 8.4–10.5)
Chloride: 96 mEq/L (ref 96–112)
GFR calc non Af Amer: 39 mL/min — ABNORMAL LOW (ref >90)
GFR, EST AFRICAN AMERICAN: 45 mL/min — AB (ref >90)
GLUCOSE: 98 mg/dL (ref 70–99)
POTASSIUM: 3.5 meq/L — AB (ref 3.7–5.3)
Sodium: 139 mEq/L (ref 137–147)
Total Bilirubin: 0.2 mg/dL — ABNORMAL LOW (ref 0.3–1.2)
Total Protein: 7.5 g/dL (ref 6.0–8.3)

## 2014-04-09 LAB — RETICULOCYTES
RBC.: 3.77 MIL/uL — AB (ref 3.87–5.11)
RETIC COUNT ABSOLUTE: 98 10*3/uL (ref 19.0–186.0)
Retic Ct Pct: 2.6 % (ref 0.4–3.1)

## 2014-04-09 LAB — TROPONIN I
Troponin I: <0.3 ng/mL (ref <0.30)
Troponin I: <0.3 ng/mL (ref <0.30)

## 2014-04-09 LAB — PRO B NATRIURETIC PEPTIDE: PRO B NATRI PEPTIDE: 678 pg/mL — AB (ref 0–125)

## 2014-04-09 LAB — CBG MONITORING, ED: Glucose-Capillary: 96 mg/dL (ref 70–99)

## 2014-04-09 LAB — TSH: TSH: 2.7 u[IU]/mL (ref 0.350–4.500)

## 2014-04-09 MED ORDER — PROMETHAZINE HCL 25 MG PO TABS
25.0000 mg | ORAL_TABLET | Freq: Four times a day (QID) | ORAL | Status: DC | PRN
  Administered 2014-04-10: 25 mg via ORAL
  Filled 2014-04-09: qty 1

## 2014-04-09 MED ORDER — SODIUM CHLORIDE 0.9 % IJ SOLN
3.0000 mL | Freq: Two times a day (BID) | INTRAMUSCULAR | Status: DC
  Administered 2014-04-09 – 2014-04-12 (×4): 3 mL via INTRAVENOUS

## 2014-04-09 MED ORDER — HYDROCODONE-ACETAMINOPHEN 5-325 MG PO TABS
0.5000 | ORAL_TABLET | Freq: Four times a day (QID) | ORAL | Status: DC | PRN
  Administered 2014-04-10: 0.5 via ORAL
  Filled 2014-04-09: qty 1

## 2014-04-09 MED ORDER — CILOSTAZOL 100 MG PO TABS
100.0000 mg | ORAL_TABLET | Freq: Two times a day (BID) | ORAL | Status: DC
  Administered 2014-04-09 – 2014-04-13 (×8): 100 mg via ORAL
  Filled 2014-04-09 (×9): qty 1

## 2014-04-09 MED ORDER — LORAZEPAM 0.5 MG PO TABS
0.5000 mg | ORAL_TABLET | Freq: Two times a day (BID) | ORAL | Status: DC | PRN
  Administered 2014-04-10 – 2014-04-11 (×2): 0.5 mg via ORAL
  Filled 2014-04-09 (×2): qty 1

## 2014-04-09 MED ORDER — POTASSIUM CHLORIDE IN NACL 20-0.9 MEQ/L-% IV SOLN
INTRAVENOUS | Status: DC
  Administered 2014-04-09 – 2014-04-11 (×3): via INTRAVENOUS
  Filled 2014-04-09 (×5): qty 1000

## 2014-04-09 MED ORDER — POTASSIUM CHLORIDE CRYS ER 20 MEQ PO TBCR
40.0000 meq | EXTENDED_RELEASE_TABLET | Freq: Once | ORAL | Status: DC
Start: 2014-04-09 — End: 2014-04-09

## 2014-04-09 MED ORDER — SERTRALINE HCL 50 MG PO TABS
50.0000 mg | ORAL_TABLET | Freq: Every day | ORAL | Status: DC
  Administered 2014-04-09 – 2014-04-13 (×5): 50 mg via ORAL
  Filled 2014-04-09 (×5): qty 1

## 2014-04-09 MED ORDER — LOSARTAN POTASSIUM 25 MG PO TABS: 25.0000 mg | ORAL_TABLET | Freq: Every day | ORAL | Status: DC

## 2014-04-09 MED ORDER — DILTIAZEM HCL ER COATED BEADS 120 MG PO CP24
120.0000 mg | ORAL_CAPSULE | Freq: Every day | ORAL | Status: DC
  Administered 2014-04-09 – 2014-04-10 (×2): 120 mg via ORAL
  Filled 2014-04-09 (×2): qty 1

## 2014-04-09 MED ORDER — RIVAROXABAN 20 MG PO TABS
20.0000 mg | ORAL_TABLET | Freq: Every day | ORAL | Status: DC
  Administered 2014-04-09 – 2014-04-12 (×4): 20 mg via ORAL
  Filled 2014-04-09 (×5): qty 1

## 2014-04-09 MED ORDER — ASPIRIN EC 81 MG PO TBEC
81.0000 mg | DELAYED_RELEASE_TABLET | Freq: Every day | ORAL | Status: DC | PRN
  Filled 2014-04-09: qty 1

## 2014-04-09 MED ORDER — GABAPENTIN 600 MG PO TABS
600.0000 mg | ORAL_TABLET | Freq: Three times a day (TID) | ORAL | Status: DC
  Administered 2014-04-09 – 2014-04-13 (×11): 600 mg via ORAL
  Filled 2014-04-09 (×13): qty 1

## 2014-04-09 MED ORDER — ATORVASTATIN CALCIUM 40 MG PO TABS
40.0000 mg | ORAL_TABLET | Freq: Every evening | ORAL | Status: DC
  Administered 2014-04-09 – 2014-04-12 (×4): 40 mg via ORAL
  Filled 2014-04-09 (×5): qty 1

## 2014-04-09 NOTE — ED Notes (Addendum)
Pt from home. States she woke up with a dull headache and took two neurontin pills and another pain pill for the headache. Went out early this am to retrieve newspaper and fell down face forward.Ptt reports LOC.  Pt states she does not know how long she was down. Reports she got up on her own, made breakfast, and went to sleep around 10:30am. No reports of syncope since this am. EMS reports no neuro deficits and pt alert and oriented x 4.

## 2014-04-09 NOTE — H&P (Signed)
Hospitalist Admission History and Physical  Patient name: [name] Medical record number: [id number] Date of birth: 12-27-1940 Age: 73 y.o. Gender: female  Primary Care Provider: Nyoka Cowden, MD  Chief Complaint: presyncope,  History of Present Illness:This is a 73 y.o. year old female with multiple medical problems including CAD s/p CABG 7342, COPD,  Diastolic CHF, Paroxysmal a flutter on xarelto, PVD presenting with presyncope. Pt got off of work today and was getting the mail, when she saw newspaper on the ground. Pt states that as she went to the ground, she was unable to stop here momentum and ended up falling on the ground. Pt denies any CP, SOB, weakness prior to fall. No hemiparesis or confusion. No LOC. No head trauama. Pt relays that she may have been a little dehydrated.  Pt is unclear how long she was on the ground. Pt was found by someone driving by and helped up. Pt went inside, ate and took a nap. Pt denies any confusion prior to or after incident. Pt does report some intermittent HA with eye pain over past month. Noted prior hx/o migraines in the past. Denies HA today.  Pt's son called and pt told him about incident. Pt's son contacted pt's daughter who contacted PCP with recs for pt to go to ER via EMS. Patient to the baby aspirin and some nausea medicine prior to EMS arrival. On presentation, SPBs 120s-170s initially. Now in 90s-100s. Labs essentially stable. Hgb 10.8, K 3.5, Cr 1.33, Ca 11.2. CXR WNL. Head CT shows age related atrophy. EKG and trop WNL.   Pt noted to have been admitted last month for a cardiac catheterization status post abnormal Myoview. Cardiac catheterization showed three-vessel occlusion of the coronary artery with the left internal mammary graft being unable to be fully assessed. Plan is for patient to obtaining CT angiogram last week. However, this was not able to be done as the machine was broken at the time.  Patient Active Problem List    Diagnosis Date Noted  . Pre-syncope 04/09/2014  . Paroxysmal atrial flutter 02/20/2014  . SVT (supraventricular tachycardia) 02/19/2014  . Aortic stenosis- mild by echo 02/15/13 02/15/2013  . Cerebrovascular disease 08/14/2012  . COPD Gold B 09/25/2010  . ANEMIA 08/28/2010  . CAD, s/p CABG 2001 04/01/2010  . GERD 12/19/2009  . PEDAL EDEMA 06/06/2009  . DEPRESSIVE DISORDER 06/06/2008  . HYPERLIPIDEMIA 06/15/2007  . HYPERTENSION 06/15/2007  . PVD, s/p AOBF '04, Lt SCA BPG 9/05 06/15/2007  . OSTEOARTHRITIS 06/15/2007  . COLONIC POLYPS, HX OF 06/15/2007   Past Medical History: Past Medical History  Diagnosis Date  . ANEMIA 08/28/2010  . CAD (coronary artery disease)     a. s/p CABGx4 in 2001.  Marland Kitchen COLONIC POLYPS, HX OF 06/15/2007  . COPD (chronic obstructive pulmonary disease)   . Depressive disorder   . GERD 12/19/2009  . Hyperlipidemia   . Hypertension   . HYPOTENSION 08/11/2010  . Osteoarthritis   . PVD (peripheral vascular disease)     a. Duplex 03/2013: stable moderate carotid dz - 87-68% RICA, 11-57% LICA, L subclavian artery to CCA stent widely patent.  b. Prior R fempop bypass graft, R CIA stent.  . Lumbar stenosis   . Lung abscess 2011  . CHF (congestive heart failure)   . Hx of colonoscopy   . Paroxysmal atrial flutter     a. Dx 01/2014, placed on Xarelto.  . Aortic stenosis     a. Mild by echo 01/2013.  Past Surgical History: Past Surgical History  Procedure Laterality Date  . L subclavian bypass  07/2004  . Tubal ligation    . Coronary artery bypass graft  05/2000  . Femoral popliteal bypass-r  04/1999  . Right common iliac pta with stent placement  07/2000  . Right leg blockage  2003  . Aorta bifemoral bypass graft  2004  . Left cea  2005  . Lung cyst biopsy  2011    Social History: History   Social History  . Marital Status: Widowed    Spouse Name: N/A    Number of Children: 2  . Years of Education: N/A   Occupational History  .     Social History  Main Topics  . Smoking status: Former Smoker -- 1.50 packs/day for 40 years    Types: Cigarettes    Quit date: 06/29/2000  . Smokeless tobacco: Never Used  . Alcohol Use: No  . Drug Use: No  . Sexual Activity: None   Other Topics Concern  . None   Social History Narrative  . None    Family History: Family History  Problem Relation Age of Onset  . Diabetes Father   . Heart attack Mother   . Diabetes    . Coronary artery disease    . Colon cancer Neg Hx     Allergies: Allergies  Allergen Reactions  . Codeine Phosphate     REACTION: unspecified    Current Facility-Administered Medications  Medication Dose Route Frequency Provider Last Rate Last Dose  . aspirin tablet 81 mg  81 mg Oral Daily PRN Shanda Howells, MD      . atorvastatin (LIPITOR) tablet 40 mg  40 mg Oral QPM Shanda Howells, MD      . cilostazol (PLETAL) tablet 100 mg  100 mg Oral BID Shanda Howells, MD      . diltiazem (CARDIZEM CD) 24 hr capsule 120 mg  120 mg Oral Daily Shanda Howells, MD      . gabapentin (NEURONTIN) tablet 600 mg  600 mg Oral TID Shanda Howells, MD      . HYDROcodone-acetaminophen (NORCO/VICODIN) 5-325 MG per tablet 0.5 tablet  0.5 tablet Oral Q6H PRN Shanda Howells, MD      . LORazepam (ATIVAN) tablet 0.5 mg  0.5 mg Oral BID PRN Shanda Howells, MD      . losartan (COZAAR) tablet 25 mg  25 mg Oral Daily Shanda Howells, MD      . promethazine (PHENERGAN) tablet 25 mg  25 mg Oral Q6H PRN Shanda Howells, MD      . Derrill Memo ON 04/10/2014] rivaroxaban (XARELTO) tablet 20 mg  20 mg Oral Q supper Shanda Howells, MD      . sertraline (ZOLOFT) tablet 50 mg  50 mg Oral Daily Shanda Howells, MD      . sodium chloride 0.9 % injection 3 mL  3 mL Intravenous Q12H Shanda Howells, MD       Current Outpatient Prescriptions  Medication Sig Dispense Refill  . aspirin 81 MG tablet Take 81 mg by mouth daily as needed for pain.      Marland Kitchen atorvastatin (LIPITOR) 40 MG tablet Take 1 tablet (40 mg total) by mouth every evening.   30 tablet  6  . cilostazol (PLETAL) 100 MG tablet Take 100 mg by mouth 2 (two) times daily.      Marland Kitchen diltiazem (CARDIZEM CD) 120 MG 24 hr capsule Take 1 capsule (120 mg total) by mouth daily.  [place]  capsule  1  . furosemide (LASIX) 40 MG tablet Take 1 tablet (40 mg total) by mouth daily.  90 tablet  6  . gabapentin (NEURONTIN) 600 MG tablet Take 1 tablet (600 mg total) by mouth 3 (three) times daily.  270 tablet  3  . hydrochlorothiazide (HYDRODIURIL) 25 MG tablet Take 25 mg by mouth daily.       Marland Kitchen HYDROcodone-acetaminophen (NORCO/VICODIN) 5-325 MG per tablet Take 0.5 tablets by mouth every 6 (six) hours as needed for moderate pain.  90 tablet  0  . LORazepam (ATIVAN) 0.5 MG tablet Take 0.5 mg by mouth 2 (two) times daily as needed for anxiety.      Marland Kitchen losartan (COZAAR) 25 MG tablet Take 25 mg by mouth daily.      . meloxicam (MOBIC) 7.5 MG tablet Take 7.5 mg by mouth daily.      . metoprolol (LOPRESSOR) 50 MG tablet Take 1 tablet (50 mg total) by mouth 2 (two) times daily.  180 tablet  6  . Multiple Vitamins-Minerals (CENTRUM SILVER ULTRA WOMENS PO) Take by mouth.      . potassium chloride (K-DUR) 10 MEQ tablet Take 1 tablet (10 mEq total) by mouth daily.  30 tablet  6  . promethazine (PHENERGAN) 25 MG tablet Take 1 tablet (25 mg total) by mouth every 6 (six) hours as needed for nausea or vomiting.  20 tablet  2  . rivaroxaban (XARELTO) 20 MG TABS tablet Take 1 tablet (20 mg total) by mouth daily with supper.  30 tablet  6  . sertraline (ZOLOFT) 50 MG tablet Take 50 mg by mouth daily.        Review Of Systems: 12 point ROS negative except as noted above in HPI.  Physical Exam: Filed Vitals:   04/09/14 1727  BP: 174/93  Pulse: 105  Temp: 98.3 F (36.8 C)  Resp: 16    General: alert and cooperative HEENT: PERRLA and extra ocular movement intact Heart: S1, S2 normal, no murmur, rub or gallop, regular rate and rhythm Lungs: clear to auscultation, no wheezes or rales and unlabored  breathing Abdomen: abdomen is soft without significant tenderness, masses, organomegaly or guarding Extremities: extremities normal, atraumatic, no cyanosis or edema Skin:no rashes, no ecchymoses Neurology: normal without focal findings  Labs and Imaging: Lab Results  Component Value Date/Time   NA 139 04/09/2014  4:44 PM   K 3.5* 04/09/2014  4:44 PM   CL 96 04/09/2014  4:44 PM   CO2 26 04/09/2014  4:44 PM   BUN 30* 04/09/2014  4:44 PM   CREATININE 1.33* 04/09/2014  4:44 PM   GLUCOSE 98 04/09/2014  4:44 PM   GLUCOSE 91 10/31/2006  9:48 AM   Lab Results  Component Value Date   WBC 7.6 04/09/2014   HGB 10.8* 04/09/2014   HCT 33.8* 04/09/2014   MCV 83.9 04/09/2014   PLT 188 04/09/2014    Dg Chest 2 View  04/09/2014   CLINICAL DATA:  Loss of consciousness  EXAM: CHEST  2 VIEW  COMPARISON:  03/08/2014  FINDINGS: Lungs are essentially clear. No focal consolidation. No pleural effusion or pneumothorax.  The heart is normal in size. Postsurgical changes related to prior CABG.  Mild degenerative changes of the visualized thoracolumbar spine.  IMPRESSION: No evidence of acute cardiopulmonary disease.   Electronically Signed   By: Julian Hy M.D.   On: 04/09/2014 18:42   Ct Head Wo Contrast  04/09/2014   CLINICAL DATA:  Loss of  consciousness  EXAM: CT HEAD WITHOUT CONTRAST  TECHNIQUE: Contiguous axial images were obtained from the base of the skull through the vertex without intravenous contrast.  COMPARISON:  None.  FINDINGS: There is calcification of the carotid arteries. There is vertebrobasilar calcification as well. There is moderate diffuse atrophy. There is mild low attenuation in the deep white matter consistent with chronic involutional change. There is no evidence of vascular territory infarct. There is a prominent perivascular space in the medial left temporal lobe. This is an incidental finding. There is no evidence of parenchymal hemorrhage or extra-axial fluid. The calvarium is intact  with no significant inflammatory change in the visualized sinuses.  IMPRESSION: Age-related atrophy and involutional change with no acute findings.   Electronically Signed   By: Skipper Cliche M.D.   On: 04/09/2014 17:55     Assessment and Plan: [name] is a 74 y.o. year old female presenting with presyncope   Presyncope: Reassuring history and exam on presentation today. No concerning prodrome prior to symptoms. However, patient does have a fairly extensive cardiac history. Differential diagnosis does include neuro cardiogenic sources. No significant metabolic derangement apart from elevated calcium today. Check ionized calcium and intact PTH. Check orthostatics. I have asked cardiology to consult as patient is due for cardiac CTA. We'll cycle cardiac enzymes. Risk stratification labs.  pro BNP. Given headache with eye pain, we'll check MRI of the brain. Seems more consistent with cluster headache. No temporal artery tenderness. Check sedimentation rate.  Coronary artery disease/CHF/atrial flutter: No cardiac complaints currently. Lower limit of normal blood pressures on exam today. ? Mild dehydration given medication regimen and hot weather. Will hold diuretics. Continue diltiazem. Hold beta blocker. Cardiology consult pending.  Anemia: downtrending hgb as of 01/2014 ( was 12.9). Hgb 10.8. No active signs of overt blood loss. On xarelto. Check hemoccult and anemia panel.   AKI: mild bump in creatinine. Baseline 0.9-->1.33 (subclinical). Mildly dry on exam. Gently hydrate and reassess. Hold nephrotoxic agents.   Peripheral vascular disease: Continue cilostazol.   FEN/GI: Heart healthy diet. Replete K. Check mag level.  Prophylaxis: xarelto  Disposition: pending further evaluation  Code Status:Full Code        Shanda Howells MD  Pager: [phone]

## 2014-04-09 NOTE — ED Provider Notes (Signed)
CSN: [id number]     Arrival date & time 04/09/14  1605 History   First MD Initiated Contact with Patient 04/09/14 1624     Chief Complaint  Patient presents with  . Loss of Consciousness     The patient is a 73 year old female past medical history as below relevant for coronary artery disease, atrial flutter ablation, and anemia who presents after single episode. Patient reports that she was walking in driveway to get mail. While doing so she had feeling that she was going to pass out. The next thing she remembers is waking up on the ground. She denies any preceding symptoms and currently has no complaints. Review of medical record shows the patient had a recent heart catheterization in which they were not able to fully evaluate coronaries/grafts and she is scheduled to have a CTA of heart.    (Consider location/radiation/quality/duration/timing/severity/associated sxs/prior Treatment) Patient is a 73 y.o. female presenting with syncope. The history is provided by the patient and medical records.  Loss of Consciousness Episode history:  Single Most recent episode:  Today Progression:  Resolved Chronicity:  New Witnessed: no   Relieved by:  Nothing Worsened by:  Nothing tried Ineffective treatments:  None tried Associated symptoms: no chest pain, no confusion, no diaphoresis, no focal weakness, no headaches, no seizures and no shortness of breath   Risk factors: coronary artery disease and vascular disease   Risk factors: no seizures     Past Medical History  Diagnosis Date  . ANEMIA 08/28/2010  . CAD (coronary artery disease)     a. s/p CABGx4 in 2001.  Marland Kitchen COLONIC POLYPS, HX OF 06/15/2007  . COPD (chronic obstructive pulmonary disease)   . Depressive disorder   . GERD 12/19/2009  . Hyperlipidemia   . Hypertension   . HYPOTENSION 08/11/2010  . Osteoarthritis   . PVD (peripheral vascular disease)     a. Duplex 03/2013: stable moderate carotid dz - 40-10% RICA, 27-25% LICA, L  subclavian artery to CCA stent widely patent.  b. Prior R fempop bypass graft, R CIA stent.  . Lumbar stenosis   . Lung abscess 2011  . CHF (congestive heart failure)   . Hx of colonoscopy   . Paroxysmal atrial flutter     a. Dx 01/2014, placed on Xarelto.  . Aortic stenosis     a. Mild by echo 01/2013.   Past Surgical History  Procedure Laterality Date  . L subclavian bypass  07/2004  . Tubal ligation    . Coronary artery bypass graft  05/2000  . Femoral popliteal bypass-r  04/1999  . Right common iliac pta with stent placement  07/2000  . Right leg blockage  2003  . Aorta bifemoral bypass graft  2004  . Left cea  2005  . Lung cyst biopsy  2011   Family History  Problem Relation Age of Onset  . Diabetes Father   . Heart attack Mother   . Diabetes    . Coronary artery disease    . Colon cancer Neg Hx    History  Substance Use Topics  . Smoking status: Former Smoker -- 1.50 packs/day for 40 years    Types: Cigarettes    Quit date: 06/29/2000  . Smokeless tobacco: Never Used  . Alcohol Use: No   OB History   Grav Para Term Preterm Abortions TAB SAB Ect Mult Living                 Review of Systems  Constitutional: Negative for diaphoresis.  Respiratory: Negative for shortness of breath.   Cardiovascular: Positive for syncope. Negative for chest pain.  Neurological: Negative for focal weakness, seizures and headaches.  Psychiatric/Behavioral: Negative for confusion.      Allergies  Codeine phosphate  Home Medications   Prior to Admission medications   Medication Sig Start Date End Date Taking? Authorizing Provider  atorvastatin (LIPITOR) 40 MG tablet Take 1 tablet (40 mg total) by mouth every evening. 02/21/14   Dayna N Dunn, PA-C  cilostazol (PLETAL) 100 MG tablet TAKE 1 TABLET BY MOUTH 2 TIMES A DAY 02/27/14   Marletta Lor, MD  diltiazem (CARDIZEM CD) 120 MG 24 hr capsule Take 1 capsule (120 mg total) by mouth daily.    Dorothy Spark, MD  felodipine  (PLENDIL) 5 MG 24 hr tablet  03/19/14   Historical Provider, MD  furosemide (LASIX) 40 MG tablet Take 1 tablet (40 mg total) by mouth daily. 03/25/14   Dorothy Spark, MD  gabapentin (NEURONTIN) 600 MG tablet Take 1 tablet (600 mg total) by mouth 3 (three) times daily. 07/23/13   Marletta Lor, MD  hydrochlorothiazide (HYDRODIURIL) 25 MG tablet Take 25 mg by mouth daily.  09/03/13   Historical Provider, MD  HYDROcodone-acetaminophen (NORCO/VICODIN) 5-325 MG per tablet Take 0.5 tablets by mouth every 6 (six) hours as needed for moderate pain. 03/25/14   Marletta Lor, MD  LORazepam (ATIVAN) 0.5 MG tablet Take 0.5 mg by mouth 2 (two) times daily as needed for anxiety.    Historical Provider, MD  meloxicam (MOBIC) 7.5 MG tablet Take 1 tablet by mouth daily. 03/23/14   Historical Provider, MD  metoprolol (LOPRESSOR) 50 MG tablet Take 1 tablet (50 mg total) by mouth 2 (two) times daily. 03/25/14   Dorothy Spark, MD  Multiple Vitamins-Minerals (CENTRUM SILVER ULTRA WOMENS PO) Take by mouth.    Historical Provider, MD  potassium chloride (K-DUR) 10 MEQ tablet Take 1 tablet (10 mEq total) by mouth daily. 03/12/14   Peter M Martinique, MD  promethazine (PHENERGAN) 25 MG tablet Take 1 tablet (25 mg total) by mouth every 6 (six) hours as needed for nausea or vomiting. 03/25/14   Marletta Lor, MD  rivaroxaban (XARELTO) 20 MG TABS tablet Take 1 tablet (20 mg total) by mouth daily with supper. 03/21/14   Dorothy Spark, MD  sertraline (ZOLOFT) 50 MG tablet Take 50 mg by mouth daily.     Historical Provider, MD   BP 121/54  Pulse 76  Temp(Src) 98.6 F (37 C) (Oral)  Resp 19  SpO2 92% Physical Exam  ED Course  Procedures (including critical care time) Labs Review Labs Reviewed  CBC WITH DIFFERENTIAL - Abnormal; Notable for the following:    Hemoglobin 10.8 (*)    HCT 33.8 (*)    RDW 15.8 (*)    All other components within normal limits  COMPREHENSIVE METABOLIC PANEL - Abnormal; Notable  for the following:    Potassium 3.5 (*)    BUN 30 (*)    Creatinine, Ser 1.33 (*)    Calcium 11.2 (*)    Total Bilirubin 0.2 (*)    GFR calc non Af Amer 39 (*)    GFR calc Af Amer 45 (*)    All other components within normal limits  PRO B NATRIURETIC PEPTIDE - Abnormal; Notable for the following:    Pro B Natriuretic peptide (BNP) 678.0 (*)    All other components within normal limits  RETICULOCYTES - Abnormal; Notable for the following:    RBC. 3.77 (*)    All other components within normal limits  TROPONIN I  TSH  TROPONIN I  COMPREHENSIVE METABOLIC PANEL  CBC WITH DIFFERENTIAL  TROPONIN I  TROPONIN I  HEMOGLOBIN A1C  VITAMIN B12  FOLATE  IRON AND TIBC  FERRITIN  PTH, INTACT AND CALCIUM  CALCIUM, IONIZED  CBG MONITORING, ED    Imaging Review Dg Chest 2 View  04/09/2014   CLINICAL DATA:  Loss of consciousness  EXAM: CHEST  2 VIEW  COMPARISON:  03/08/2014  FINDINGS: Lungs are essentially clear. No focal consolidation. No pleural effusion or pneumothorax.  The heart is normal in size. Postsurgical changes related to prior CABG.  Mild degenerative changes of the visualized thoracolumbar spine.  IMPRESSION: No evidence of acute cardiopulmonary disease.   Electronically Signed   By: Julian Hy M.D.   On: 04/09/2014 18:42   Ct Head Wo Contrast  04/09/2014   CLINICAL DATA:  Loss of consciousness  EXAM: CT HEAD WITHOUT CONTRAST  TECHNIQUE: Contiguous axial images were obtained from the base of the skull through the vertex without intravenous contrast.  COMPARISON:  None.  FINDINGS: There is calcification of the carotid arteries. There is vertebrobasilar calcification as well. There is moderate diffuse atrophy. There is mild low attenuation in the deep white matter consistent with chronic involutional change. There is no evidence of vascular territory infarct. There is a prominent perivascular space in the medial left temporal lobe. This is an incidental finding. There is no  evidence of parenchymal hemorrhage or extra-axial fluid. The calvarium is intact with no significant inflammatory change in the visualized sinuses.  IMPRESSION: Age-related atrophy and involutional change with no acute findings.   Electronically Signed   By: Skipper Cliche M.D.   On: 04/09/2014 17:55     EKG Interpretation   Date/Time:  Tuesday Apr 09 2014 16:18:15 EDT Ventricular Rate:  70 PR Interval:  167 QRS Duration: 113 QT Interval:  438 QTC Calculation: 473 R Axis:   32 Text Interpretation:  Sinus rhythm Atrial premature complex Borderline  intraventricular conduction delay RSR' in V1 or V2, probably normal  variant No significant change since last tracing Confirmed by [facility]  MD,  Juanda Crumble [phone]) on 04/09/2014 4:25:33 PM      MDM   Final diagnoses:  None   Diagnoses that have been ruled out:  None  Diagnoses that are still under consideration:  None  Final diagnoses:  Syncope    Patient gist tumors department after syncopal episode. Her vital signs were unremarkable upon arrival in the emergency department and throughout stay. Physical exam as above remarkable for no signs of volume overload or anemia. EKG showed no signs of arrhythmia or acute ischemia. As patient was found on the ground and on anti-coagulation a head CT was obtained and unremarkable for acute abnormality. Chest x-ray and labs including troponin and BNP were unremarkable. However given patient's age, past medical history, and presentation it was felt that admission was warranted. Hospitalist consulted and patient admitted without acute events.     Corlis Leak, MD 04/10/14 [phone]

## 2014-04-09 NOTE — ED Notes (Signed)
Patient transported to X-ray 

## 2014-04-09 NOTE — Telephone Encounter (Signed)
Patient Information:  Caller Name: Adela Lank  Phone: [phone]  Patient: [name], [name]  Gender: Female  DOB: 13-Jun-1941  Age: 73 Years  PCP: Bluford Kaufmann [name])  Office Follow Up:  Does the office need to follow up with this patient?: No  Instructions For The Office: N/A  RN Note:  Called the office and spoke with the Nurse and she advised that the pt. does need to go to [facility] now. Pt. advised of this. Does not want to go by ambulance. States her daughter will take her.  Symptoms  Reason For Call & Symptoms: Pt. with headache and photosensitivity. Pt. fell behind her car. Not sure if she blacked out but did not trip over anything. Came too when on the ground. Was able to get up and go into the house. Called her son and he thought her speech was slurred. Speech has cleared up now. Is in the process of having cardiac testing done due to her heart being out of rhythm.  Reviewed Health History In EMR: Yes  Reviewed Medications In EMR: Yes  Reviewed Allergies In EMR: Yes  Reviewed Surgeries / Procedures: Yes  Date of Onset of Symptoms: 04/09/2014  Guideline(s) Used:  Neurologic Deficit  Disposition Per Guideline:   Go to ED Now (or to Office with PCP Approval)  Reason For Disposition Reached:   Headache (with neurologic deficit)  Advice Given:  Call Back If:  You become worse.  Patient Will Follow Care Advice:  YES

## 2014-04-10 ENCOUNTER — Observation Stay (HOSPITAL_COMMUNITY): Payer: Medicare Other

## 2014-04-10 DIAGNOSIS — I2510 Atherosclerotic heart disease of native coronary artery without angina pectoris: Secondary | ICD-10-CM

## 2014-04-10 DIAGNOSIS — I10 Essential (primary) hypertension: Secondary | ICD-10-CM

## 2014-04-10 DIAGNOSIS — R55 Syncope and collapse: Secondary | ICD-10-CM | POA: Diagnosis present

## 2014-04-10 DIAGNOSIS — I639 Cerebral infarction, unspecified: Secondary | ICD-10-CM | POA: Diagnosis present

## 2014-04-10 DIAGNOSIS — E876 Hypokalemia: Secondary | ICD-10-CM

## 2014-04-10 DIAGNOSIS — I6350 Cerebral infarction due to unspecified occlusion or stenosis of unspecified cerebral artery: Secondary | ICD-10-CM

## 2014-04-10 LAB — CBC WITH DIFFERENTIAL/PLATELET
Basophils Absolute: 0 K/uL (ref 0.0–0.1)
Basophils Relative: 0 % (ref 0–1)
Eosinophils Absolute: 0.2 K/uL (ref 0.0–0.7)
Eosinophils Relative: 3 % (ref 0–5)
HCT: 29.9 % — ABNORMAL LOW (ref 36.0–46.0)
Hemoglobin: 9.5 g/dL — ABNORMAL LOW (ref 12.0–15.0)
Lymphocytes Relative: 31 % (ref 12–46)
Lymphs Abs: 2.2 K/uL (ref 0.7–4.0)
MCH: 26.6 pg (ref 26.0–34.0)
MCHC: 31.8 g/dL (ref 30.0–36.0)
MCV: 83.8 fL (ref 78.0–100.0)
Monocytes Absolute: 0.5 K/uL (ref 0.1–1.0)
Monocytes Relative: 7 % (ref 3–12)
Neutro Abs: 4.2 K/uL (ref 1.7–7.7)
Neutrophils Relative %: 59 % (ref 43–77)
Platelets: 170 K/uL (ref 150–400)
RBC: 3.57 MIL/uL — ABNORMAL LOW (ref 3.87–5.11)
RDW: 15.6 % — ABNORMAL HIGH (ref 11.5–15.5)
WBC: 7 K/uL (ref 4.0–10.5)

## 2014-04-10 LAB — COMPREHENSIVE METABOLIC PANEL
ALK PHOS: 68 U/L (ref 39–117)
ALT: 13 U/L (ref 0–35)
AST: 18 U/L (ref 0–37)
Albumin: 3.1 g/dL — ABNORMAL LOW (ref 3.5–5.2)
BILIRUBIN TOTAL: 0.3 mg/dL (ref 0.3–1.2)
BUN: 28 mg/dL — AB (ref 6–23)
CHLORIDE: 99 meq/L (ref 96–112)
CO2: 28 mEq/L (ref 19–32)
Calcium: 10.1 mg/dL (ref 8.4–10.5)
Creatinine, Ser: 1.2 mg/dL — ABNORMAL HIGH (ref 0.50–1.10)
GFR calc non Af Amer: 44 mL/min — ABNORMAL LOW (ref >90)
GFR, EST AFRICAN AMERICAN: 51 mL/min — AB (ref >90)
GLUCOSE: 104 mg/dL — AB (ref 70–99)
POTASSIUM: 3 meq/L — AB (ref 3.7–5.3)
SODIUM: 142 meq/L (ref 137–147)
TOTAL PROTEIN: 6.2 g/dL (ref 6.0–8.3)

## 2014-04-10 LAB — TROPONIN I
Troponin I: <0.3 ng/mL (ref <0.30)
Troponin I: <0.3 ng/mL (ref <0.30)

## 2014-04-10 LAB — IRON AND TIBC
Iron: 29 ug/dL — ABNORMAL LOW (ref 42–135)
Saturation Ratios: 6 % — ABNORMAL LOW (ref 20–55)
TIBC: 501 ug/dL — ABNORMAL HIGH (ref 250–470)
UIBC: 472 ug/dL — AB (ref 125–400)

## 2014-04-10 LAB — FERRITIN: FERRITIN: 14 ng/mL (ref 10–291)

## 2014-04-10 LAB — HEMOGLOBIN A1C
Hgb A1c MFr Bld: 6.9 % — ABNORMAL HIGH (ref <5.7)
Hgb A1c MFr Bld: 6.6 % — ABNORMAL HIGH (ref <5.7)
Mean Plasma Glucose: 151 mg/dL — ABNORMAL HIGH (ref <117)
Mean Plasma Glucose: 143 mg/dL — ABNORMAL HIGH (ref <117)

## 2014-04-10 LAB — PTH, INTACT AND CALCIUM
Calcium, Total (PTH): 10.8 mg/dL — ABNORMAL HIGH (ref 8.4–10.5)
PTH: 5.3 pg/mL — AB (ref 14.0–72.0)

## 2014-04-10 LAB — FOLATE

## 2014-04-10 LAB — VITAMIN B12: Vitamin B-12: 783 pg/mL (ref 211–911)

## 2014-04-10 LAB — CALCIUM, IONIZED: Calcium, Ion: 1.37 mmol/L — ABNORMAL HIGH (ref 1.13–1.30)

## 2014-04-10 MED ORDER — HYDROCODONE-ACETAMINOPHEN 5-325 MG PO TABS
0.5000 | ORAL_TABLET | Freq: Four times a day (QID) | ORAL | Status: DC | PRN
  Administered 2014-04-10 – 2014-04-12 (×4): 1 via ORAL
  Filled 2014-04-10 (×3): qty 1

## 2014-04-10 MED ORDER — STROKE: EARLY STAGES OF RECOVERY BOOK
Freq: Once | Status: AC
  Administered 2014-04-10: 20:00:00
  Filled 2014-04-10: qty 1

## 2014-04-10 MED ORDER — METOPROLOL TARTRATE 25 MG PO TABS
25.0000 mg | ORAL_TABLET | Freq: Once | ORAL | Status: DC
  Filled 2014-04-10: qty 1

## 2014-04-10 MED ORDER — DILTIAZEM HCL 100 MG IV SOLR
5.0000 mg/h | INTRAVENOUS | Status: DC
  Administered 2014-04-10: 5 mg/h via INTRAVENOUS
  Filled 2014-04-10 (×2): qty 100

## 2014-04-10 MED ORDER — DILTIAZEM LOAD VIA INFUSION
10.0000 mg | Freq: Once | INTRAVENOUS | Status: AC
  Administered 2014-04-10: 10 mg via INTRAVENOUS
  Filled 2014-04-10: qty 10

## 2014-04-10 MED ORDER — POTASSIUM CHLORIDE CRYS ER 20 MEQ PO TBCR
40.0000 meq | EXTENDED_RELEASE_TABLET | ORAL | Status: AC
  Administered 2014-04-10 (×3): 40 meq via ORAL
  Filled 2014-04-10 (×3): qty 2

## 2014-04-10 MED ORDER — HYDROCODONE-ACETAMINOPHEN 5-325 MG PO TABS
1.0000 | ORAL_TABLET | Freq: Four times a day (QID) | ORAL | Status: DC | PRN
  Filled 2014-04-10: qty 1

## 2014-04-10 MED ORDER — METOPROLOL TARTRATE 1 MG/ML IV SOLN
5.0000 mg | Freq: Once | INTRAVENOUS | Status: AC
  Administered 2014-04-10: 5 mg via INTRAVENOUS
  Filled 2014-04-10: qty 5

## 2014-04-10 NOTE — ED Provider Notes (Signed)
I saw and evaluated the patient, reviewed the resident's note and I agree with the findings and plan.   EKG Interpretation   Date/Time:  Tuesday Apr 09 2014 16:18:15 EDT Ventricular Rate:  70 PR Interval:  167 QRS Duration: 113 QT Interval:  438 QTC Calculation: 473 R Axis:   32 Text Interpretation:  Sinus rhythm Atrial premature complex Borderline  intraventricular conduction delay RSR' in V1 or V2, probably normal  variant No significant change since last tracing Confirmed by [facility]  MD,  Juanda Crumble [phone]) on 04/09/2014 4:25:33 PM      Pt with recent aflutter ablation had syncopal episode this afternoon. ED workup unremarkable, admit for further eval.   Challen Spainhour B. Karle Starch, MD 04/10/14 1239

## 2014-04-10 NOTE — Progress Notes (Signed)
Discussed with Mr/ Kilroy PA-C.

## 2014-04-10 NOTE — Addendum Note (Signed)
Addended by: Nuala Alpha on: 04/10/2014 02:27 PM   Modules accepted: Orders

## 2014-04-10 NOTE — Consult Note (Signed)
Referring Physician: Dr. Dillard Essex    Chief Complaint: Near syncope with small parietal stroke.  HPI: [name] is an 73 y.o. female with history of atrial flutter on Xarelto, coronary artery disease with CABG, hypertension and hyperlipidemia, admitted on 04/09/2014 following an episode of near-syncope. Patient recalls bending over to pick up a newspaper and continued to the ground. It's unclear how long she was unable to get back to her feet. The episode occurred around 11:15 AM yesterday. As no previous history of stroke nor TIA. CT scan of her head yesterday showed no signs of acute intracranial abnormality. 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 NIH stroke score at the time of this evaluation was 0. Patient had no focal deficits on admission.  LSN: 11:15 AM on 04/09/2014 tPA Given: No: No clear deficits and beyond time window for treatment consideration MRankin: 0  Past Medical History  Diagnosis Date  . ANEMIA 08/28/2010  . CAD (coronary artery disease)     a. s/p CABGx4 in 2001.  Marland Kitchen COLONIC POLYPS, HX OF 06/15/2007  . COPD (chronic obstructive pulmonary disease)   . Depressive disorder   . GERD 12/19/2009  . Hyperlipidemia   . Hypertension   . HYPOTENSION 08/11/2010  . Osteoarthritis   . PVD (peripheral vascular disease)     a. Duplex 03/2013: stable moderate carotid dz - 09-81% RICA, 19-14% LICA, L subclavian artery to CCA stent widely patent.  b. Prior R fempop bypass graft, R CIA stent.  . Lumbar stenosis   . Lung abscess 2011  . CHF (congestive heart failure)   . Hx of colonoscopy   . Paroxysmal atrial flutter     a. Dx 01/2014, placed on Xarelto.  . Aortic stenosis     a. Mild by echo 01/2013.    Family History  Problem Relation Age of Onset  . Diabetes Father   . Heart attack Mother   . Diabetes    . Coronary artery disease    . Colon cancer Neg Hx      Medications: I have reviewed the patient's current  medications.  ROS: History obtained from child and the patient  General ROS: negative for - chills, fatigue, fever, night sweats, weight gain or weight loss Psychological ROS: negative for - behavioral disorder, hallucinations, memory difficulties, mood swings or suicidal ideation Ophthalmic ROS: negative for - blurry vision, double vision, eye pain or loss of vision ENT ROS: negative for - epistaxis, nasal discharge, oral lesions, sore throat, tinnitus or vertigo Allergy and Immunology ROS: negative for - hives or itchy/watery eyes Hematological and Lymphatic ROS: negative for - bleeding problems, bruising or swollen lymph nodes Endocrine ROS: negative for - galactorrhea, hair pattern changes, polydipsia/polyuria or temperature intolerance Respiratory ROS: negative for - cough, hemoptysis, shortness of breath or wheezing Cardiovascular ROS: negative for - chest pain, dyspnea on exertion, edema or irregular heartbeat Gastrointestinal ROS: negative for - abdominal pain, diarrhea, hematemesis, nausea/vomiting or stool incontinence Genito-Urinary ROS: negative for - dysuria, hematuria, incontinence or urinary frequency/urgency Musculoskeletal ROS: negative for - joint swelling or muscular weakness Neurological ROS: as noted in HPI Dermatological ROS: negative for rash and skin lesion changes  Physical Examination: Blood pressure 156/70, pulse 150, temperature 97.8 F (36.6 C), temperature source Oral, resp. rate 22, height 5' 2.5" (1.588 m), weight 68.675 kg (151 lb 6.4 oz), SpO2 95.00%.  Neurologic Examination: Mental Status: Alert, oriented, thought content appropriate.  Speech fluent without evidence of  aphasia. Able to follow commands without difficulty. Cranial Nerves: II-Visual fields were normal. III/IV/VI-Pupils were equal and reacted. Extraocular movements were full and conjugate.    V/VII-no facial numbness and no facial weakness. VIII-normal. X-normal speech and symmetrical  palatal movement. Motor: 5/5 bilaterally with normal tone and bulk Sensory: Normal throughout. Deep Tendon Reflexes: 2+ and symmetric. Plantars: Flexor bilaterally Cerebellar: Normal finger-to-nose testing. Carotid auscultation: Loud bilateral carotid bruits, likely transmitted systolic murmur.  Dg Chest 2 View  04/09/2014   CLINICAL DATA:  Loss of consciousness  EXAM: CHEST  2 VIEW  COMPARISON:  03/08/2014  FINDINGS: Lungs are essentially clear. No focal consolidation. No pleural effusion or pneumothorax.  The heart is normal in size. Postsurgical changes related to prior CABG.  Mild degenerative changes of the visualized thoracolumbar spine.  IMPRESSION: No evidence of acute cardiopulmonary disease.   Electronically Signed   By: Julian Hy M.D.   On: 04/09/2014 18:42   Ct Head Wo Contrast  04/09/2014   CLINICAL DATA:  Loss of consciousness  EXAM: CT HEAD WITHOUT CONTRAST  TECHNIQUE: Contiguous axial images were obtained from the base of the skull through the vertex without intravenous contrast.  COMPARISON:  None.  FINDINGS: There is calcification of the carotid arteries. There is vertebrobasilar calcification as well. There is moderate diffuse atrophy. There is mild low attenuation in the deep white matter consistent with chronic involutional change. There is no evidence of vascular territory infarct. There is a prominent perivascular space in the medial left temporal lobe. This is an incidental finding. There is no evidence of parenchymal hemorrhage or extra-axial fluid. The calvarium is intact with no significant inflammatory change in the visualized sinuses.  IMPRESSION: Age-related atrophy and involutional change with no acute findings.   Electronically Signed   By: Skipper Cliche M.D.   On: 04/09/2014 17:55   Mr Brain Wo Contrast  04/10/2014   CLINICAL DATA:  Presyncope.  Headache.  The examination had to be discontinued prior to completion due to nausea and anxiety.  EXAM: MRI HEAD  WITHOUT CONTRAST  TECHNIQUE: Multiplanar, multiecho pulse sequences of the brain and surrounding structures were obtained without intravenous contrast.  COMPARISON:  CT head without contrast 04/09/2014.  FINDINGS: An acute nonhemorrhagic when infarct is present in the right parietal lobe measuring 10 mm. Focal T2 hyperintensity is associated.  Mild generalized atrophy is present. No hemorrhage or mass lesion is evident. The ventricles are of normal size. No significant extra-axial fluid collection is present.  Flow is present in the major intracranial arteries. The patient is status post bilateral lens extractions. The paranasal sinuses and mastoid air cells are clear.  IMPRESSION: 1. 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   Electronically Signed   By: Lawrence Santiago M.D.   On: 04/10/2014 18:39    Assessment: 73 y.o. female with multiple risk factors for stroke presenting with presyncopal symptoms, and abnormal MRI with acute small right parietal stroke.  Stroke Risk Factors - atrial fibrillation, family history, hyperlipidemia and hypertension  Plan: 1. HgbA1c, fasting lipid panel 2. MRA  of the brain without contrast 3. PT consult, OT consult, Speech consult 4. Echocardiogram 5. Carotid dopplers - done as outpatient last week 6. Prophylactic therapy-Anticoagulation: Xarelto 7. Risk factor modification 8. Telemetry monitoring  C.R. Nicole Kindred, MD [facility] [phone]  04/10/2014, 7:36 PM

## 2014-04-10 NOTE — Progress Notes (Signed)
Called because pt went into rapid AF- 140. She did not get her beta blocker today, will try IV Lopressor 5 mg x 1. If not effective she should be transferred to stepdown for IV Diltiazem.   Kerin Ransom PA-C 04/10/2014 7:07 PM

## 2014-04-10 NOTE — Progress Notes (Signed)
Patient's blood pressure 80/50 in right arm, unable to give PO lopressor ordered for Coronary CT scan.  Dr. Angelena Form and Dr. Meda Coffee aware.  Will continue to monitor.  [name]

## 2014-04-10 NOTE — Progress Notes (Signed)
Per Dr Meda Coffee, after reviewing pts doppler carotid, pt needs to be referred to vascular surgery to Dr Trula Slade, Early, or Dixon for diagnosis of innominate artery stenosis.  Order placed in epic. [facility] schedulers made aware to have this scheduled and contact patient regarding.

## 2014-04-10 NOTE — Progress Notes (Signed)
Patient returned from MRI very anxious, nauseated and upset.  Heart rate now Afib with rates sustaining in the 140's.  EKG obtained.  Kerin Ransom PA paged and notified, orders received.  5mg  IV lopressor x1 given per orders with rates back down to 100's.  Patient rates now back up to 130's sustaining, Dr. Colon Flattery paged and notified.  Orders for IV cardizem to be entered.  Ananias Pilgrim RN aware.  Will continue to monitor.  Sanda Linger

## 2014-04-10 NOTE — Progress Notes (Addendum)
[facility] PROGRESS NOTE  [name] GGE:[id number] DOB: January 30, 1941 DOA: 04/09/2014 PCP: Nyoka Cowden, MD  Assessment/Plan: Acute infarct, right parietal lobe -Per MRI this p.m. -Continue aspirin and Xarelto -Obtain echo, FLP,A1C. she just had carotid Dopplers done on May 7>> will hold off and defer to neuro -Consult neuro for further recommendations -Consult PT OT ST CAD/CHF/atrial flutter -Cardiac enzymes negative -I have consulted cardiology for cardiac CT Anemia -Hemoglobin is 9.5 with no gross evidence of bleeding  AKI -Improved with hydration Peripheral vascular disease -Continue Pletal  Hypercalcemia -Resolved with hydration Hypokalemia  -Replace K.  Code Status: Full Family Communication: None are at the bedside  Disposition Plan: To home when medically ready   Consultants:  Cardiology  Procedures:  None  Antibiotics:  None   HPI/Subjective: Patient denies chest pain and dizziness. She was requesting for the cardiac CT which had been ordered by Dr. Meda Coffee to be done while she is in the hospital.  Objective: Filed Vitals:   04/10/14 0515  BP: 122/58  Pulse: 66  Temp: 98 F (36.7 C)  Resp: 18    Intake/Output Summary (Last 24 hours) at 04/10/14 1158 Last data filed at 04/10/14 0255  Gross per 24 hour  Intake    360 ml  Output    925 ml  Net   -565 ml   Filed Weights   04/09/14 2046 04/10/14 0515  Weight: 68.675 kg (151 lb 6.4 oz) 68.675 kg (151 lb 6.4 oz)    Exam:  General: alert & oriented x 3 In NAD Cardiovascular: RRR, nl S1 s2 Respiratory: CTAB Abdomen: soft +BS NT/ND, no masses palpable Extremities: No cyanosis and no edema  Neuro: Alert and oriented x3 creatinine is 2-12 grossly intact strength is normal and symmetric. Sensory grossly intact    Data Reviewed: Basic Metabolic Panel:  Recent Labs Lab 04/09/14 1644 04/09/14 2101 04/10/14 0129  NA 139  --  142  K 3.5*  --  3.0*  CL 96  --  99  CO2  26  --  28  GLUCOSE 98  --  104*  BUN 30*  --  28*  CREATININE 1.33*  --  1.20*  CALCIUM 11.2* 10.8* 10.1   Liver Function Tests:  Recent Labs Lab 04/09/14 1644 04/10/14 0129  AST 21 18  ALT 16 13  ALKPHOS 83 68  BILITOT 0.2* 0.3  PROT 7.5 6.2  ALBUMIN 3.8 3.1*   No results found for this basename: LIPASE, AMYLASE,  in the last 168 hours No results found for this basename: AMMONIA,  in the last 168 hours CBC:  Recent Labs Lab 04/09/14 1644 04/10/14 0129  WBC 7.6 7.0  NEUTROABS 4.7 4.2  HGB 10.8* 9.5*  HCT 33.8* 29.9*  MCV 83.9 83.8  PLT 188 170   Cardiac Enzymes:  Recent Labs Lab 04/09/14 1644 04/09/14 2101 04/10/14 0128 04/10/14 0707  TROPONINI <0.30 <0.30 <0.30 <0.30   BNP (last 3 results)  Recent Labs  04/09/14 2101  PROBNP 678.0*   CBG:  Recent Labs Lab 04/09/14 1700  GLUCAP 96    No results found for this or any previous visit (from the past 240 hour(s)).   Studies: Dg Chest 2 View  04/09/2014   CLINICAL DATA:  Loss of consciousness  EXAM: CHEST  2 VIEW  COMPARISON:  03/08/2014  FINDINGS: Lungs are essentially clear. No focal consolidation. No pleural effusion or pneumothorax.  The heart is normal in size. Postsurgical changes related to prior CABG.  Mild  degenerative changes of the visualized thoracolumbar spine.  IMPRESSION: No evidence of acute cardiopulmonary disease.   Electronically Signed   By: Julian Hy M.D.   On: 04/09/2014 18:42   Ct Head Wo Contrast  04/09/2014   CLINICAL DATA:  Loss of consciousness  EXAM: CT HEAD WITHOUT CONTRAST  TECHNIQUE: Contiguous axial images were obtained from the base of the skull through the vertex without intravenous contrast.  COMPARISON:  None.  FINDINGS: There is calcification of the carotid arteries. There is vertebrobasilar calcification as well. There is moderate diffuse atrophy. There is mild low attenuation in the deep white matter consistent with chronic involutional change. There is no  evidence of vascular territory infarct. There is a prominent perivascular space in the medial left temporal lobe. This is an incidental finding. There is no evidence of parenchymal hemorrhage or extra-axial fluid. The calvarium is intact with no significant inflammatory change in the visualized sinuses.  IMPRESSION: Age-related atrophy and involutional change with no acute findings.   Electronically Signed   By: Skipper Cliche M.D.   On: 04/09/2014 17:55    Scheduled Meds: . atorvastatin  40 mg Oral QPM  . cilostazol  100 mg Oral BID  . diltiazem  120 mg Oral Daily  . gabapentin  600 mg Oral TID  . potassium chloride  40 mEq Oral Q4H  . rivaroxaban  20 mg Oral Q supper  . sertraline  50 mg Oral Daily  . sodium chloride  3 mL Intravenous Q12H   Continuous Infusions: . 0.9 % NaCl with KCl 20 mEq / L 75 mL/hr at 04/10/14 1126    Active Problems:   Pre-syncope   Syncope    Time spent: [facility] Pager [phone]. If 7PM-7AM, please contact night-coverage at [email or web address], password [facility] 04/10/2014, 11:58 AM  LOS: 1 day

## 2014-04-10 NOTE — Consult Note (Signed)
Patient ID: [name] MRN: [id number] DOB/AGE: 01-31-1941 73 y.o.  Admit date: 04/09/2014 Referring Physician: Dillard Essex Primary Cardiologist: Ena Dawley, MD Reason for Consultation: Pre-syncope/fall and / ? If pt can have inpatient coronary CTA. Request for consult at noon today.   HPI: 73 yo female with history of CAD s/p CABG, chronic diastolic CHF, PAF, PAD, COPD admitted after pre-syncopal episode. She was admitted yesterday after a fall. She bent over to pick up a newspaper and fell to the ground denying any chest pain, dyspnea, dizziness prior to falling. No seizure activity and no LOC.  She did have a headache before she fell. She felt like her normal self after the fall and only called EMS after her son encouraged her to do so.  CXR WNL. Head CT shows age related atrophy. EKG without ischemic changes. Troponin negative. Her cardiac issues are followed by Dr. Ena Dawley. She has been recently diagnosed with SVT, possibly 2:1 flutter with ventricular rate 160 BPM (ECG on 02/19/2014). She was admitted to the hospital the next day for TEE/CV but was found to be in sinus rhythm. She underwent a Lexiscan nuclear stress test that showed anterior ischemia. Cardiac cath 03/11/14 per Dr. Martinique showed occlusion of all native vessels with patent SVG to the OM, patent SVG to the RCA. 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 She was scheduled for a CTA to confirm LIMA patency and assess functionality of the left carotid to subclavian bypass graft but this test could not be completed due to malfunction of the CT scanner that day. Left ventricular systolic function is normal, LVEF is estimated at 60 - 65%, there is no significant mitral regurgitation. She has also worn a cardiac monitor and was found to have at least 6 episodes of SVT, some irregular with HR 160 - 190 BPM.   She  tells me today that she feels well. She has no chest pain or SOB.    Past Medical History  Diagnosis Date  . ANEMIA 08/28/2010  . CAD (coronary artery disease)     a. s/p CABGx4 in 2001.  Marland Kitchen COLONIC POLYPS, HX OF 06/15/2007  . COPD (chronic obstructive pulmonary disease)   . Depressive disorder   . GERD 12/19/2009  . Hyperlipidemia   . Hypertension   . HYPOTENSION 08/11/2010  . Osteoarthritis   . PVD (peripheral vascular disease)     a. Duplex 03/2013: stable moderate carotid dz - 41-93% RICA, 79-02% LICA, L subclavian artery to CCA stent widely patent.  b. Prior R fempop bypass graft, R CIA stent.  . Lumbar stenosis   . Lung abscess 2011  . CHF (congestive heart failure)   . Hx of colonoscopy   . Paroxysmal atrial flutter     a. Dx 01/2014, placed on Xarelto.  . Aortic stenosis     a. Mild by echo 01/2013.    Family History  Problem Relation Age of Onset  . Diabetes Father   . Heart attack Mother   . Diabetes    . Coronary artery disease    . Colon cancer Neg Hx     History   Social History  . Marital Status: Widowed    Spouse Name: N/A    Number of Children: 2  . Years of Education: N/A   Occupational History  .     Social History Main Topics  .  Smoking status: Former Smoker -- 1.50 packs/day for 40 years    Types: Cigarettes    Quit date: 06/29/2000  . Smokeless tobacco: Never Used  . Alcohol Use: No  . Drug Use: No  . Sexual Activity: Not on file   Other Topics Concern  . Not on file   Social History Narrative  . No narrative on file    Past Surgical History  Procedure Laterality Date  . L subclavian bypass  07/2004  . Tubal ligation    . Coronary artery bypass graft  05/2000  . Femoral popliteal bypass-r  04/1999  . Right common iliac pta with stent placement  07/2000  . Right leg blockage  2003  . Aorta bifemoral bypass graft  2004  . Left cea  2005  . Lung cyst biopsy  2011    Allergies  Allergen Reactions  . Codeine Phosphate     REACTION:  unspecified    Prescriptions prior to admission  Medication Sig Dispense Refill  . aspirin 81 MG tablet Take 81 mg by mouth daily as needed for pain.      Marland Kitchen atorvastatin (LIPITOR) 40 MG tablet Take 1 tablet (40 mg total) by mouth every evening.  30 tablet  6  . cilostazol (PLETAL) 100 MG tablet Take 100 mg by mouth 2 (two) times daily.      Marland Kitchen diltiazem (CARDIZEM CD) 120 MG 24 hr capsule Take 1 capsule (120 mg total) by mouth daily.  90 capsule  1  . furosemide (LASIX) 40 MG tablet Take 1 tablet (40 mg total) by mouth daily.  90 tablet  6  . gabapentin (NEURONTIN) 600 MG tablet Take 1 tablet (600 mg total) by mouth 3 (three) times daily.  270 tablet  3  . hydrochlorothiazide (HYDRODIURIL) 25 MG tablet Take 25 mg by mouth daily.       Marland Kitchen HYDROcodone-acetaminophen (NORCO/VICODIN) 5-325 MG per tablet Take 0.5 tablets by mouth every 6 (six) hours as needed for moderate pain.  90 tablet  0  . LORazepam (ATIVAN) 0.5 MG tablet Take 0.5 mg by mouth 2 (two) times daily as needed for anxiety.      Marland Kitchen losartan (COZAAR) 25 MG tablet Take 25 mg by mouth daily.      . meloxicam (MOBIC) 7.5 MG tablet Take 7.5 mg by mouth daily.      . metoprolol (LOPRESSOR) 50 MG tablet Take 1 tablet (50 mg total) by mouth 2 (two) times daily.  180 tablet  6  . Multiple Vitamins-Minerals (CENTRUM SILVER ULTRA WOMENS PO) Take by mouth.      . potassium chloride (K-DUR) 10 MEQ tablet Take 1 tablet (10 mEq total) by mouth daily.  30 tablet  6  . promethazine (PHENERGAN) 25 MG tablet Take 1 tablet (25 mg total) by mouth every 6 (six) hours as needed for nausea or vomiting.  20 tablet  2  . rivaroxaban (XARELTO) 20 MG TABS tablet Take 1 tablet (20 mg total) by mouth daily with supper.  30 tablet  6  . sertraline (ZOLOFT) 50 MG tablet Take 50 mg by mouth daily.         Review of systems complete and found to be negative unless listed above   Physical Exam: Blood pressure 127/68, pulse 79, temperature 97.8 F (36.6 C),  temperature source Oral, resp. rate 18, height 5' 2.5" (1.588 m), weight 151 lb 6.4 oz (68.675 kg), SpO2 95.00%.    General: Well developed, well nourished, NAD  HEENT: OP clear, mucus membranes moist  SKIN: warm, dry. No rashes.  Neuro: No focal deficits  Musculoskeletal: Muscle strength 5/5 all ext  Psychiatric: Mood and affect normal  Neck: No JVD, no carotid bruits, no thyromegaly, no lymphadenopathy.  Lungs:Clear bilaterally, no wheezes, rhonci, crackles  Cardiovascular: Regular rate and rhythm. No murmurs, gallops or rubs.  Abdomen:Soft. Bowel sounds present. Non-tender.  Extremities: No lower extremity edema. Pulses are 2 + in the bilateral DP/PT.  Labs:   Lab Results  Component Value Date   WBC 7.0 04/10/2014   HGB 9.5* 04/10/2014   HCT 29.9* 04/10/2014   MCV 83.8 04/10/2014   PLT 170 04/10/2014     Recent Labs Lab 04/10/14 0129  NA 142  K 3.0*  CL 99  CO2 28  BUN 28*  CREATININE 1.20*  CALCIUM 10.1  PROT 6.2  BILITOT 0.3  ALKPHOS 68  ALT 13  AST 18  GLUCOSE 104*   Lab Results  Component Value Date             TROPONINI <0.30 04/10/2014    Cardiac cath 03/11/14:  Left mainstem: 100% occlusion at the ostium  Left anterior descending (LAD): Not visualized.  Left circumflex (LCx): Not visualized  Right coronary artery (RCA): 100% in the mid vessel.  The SVG to the OM is widely patent.  The SVG to the RCA is widely patent. There are some collaterals to the first septal perforator from the RCA.  Left ventriculography: Left ventricular systolic function is normal, LVEF is estimated at 6065%, there is no significant mitral regurgitation  Given her known left subclavian occlusion we next attempted to access the IMA graft from a left radial approach. The left wrist was prepped and draped in a sterile fashion and was anesthetized with 1% lidocaine. Using ultrasound we attempted to access the left radial artery. The artery was small on ultrasound. Despite engaging the  artery 3 times with the needle I was unable to pass the wire into the artery and access could not be obtained.  Final Conclusions:  1. 3 vessel occlusive CAD with left main occlusion.  2. Patent SVG to the RCA  3. Patent SVG to the OM  4. Normal LV function.  5. Unable to access left IMA graft due to inability to access the left radial artery.   Chest x-ray:  No evidence of acute cardiopulmonary disease  EKG: sinus, PAC, rate 70 bpm.   ASSESSMENT AND PLAN:   1. Pre-syncope/fall: No dizziness or palpitations before the fall. Etiology unclear. I do not think her fall was cardiac related.   2. CAD: s/p CABG. Recent abnormal stress test and cath with 2 patent bypasses but unable to visualize LIMA graft given prior left carotid to subclavian bypass. Plans for coronary CTA later this week as an outpatient. I have discussed this with Dr. Meda Coffee and will arrange coronary CTA for later today. (Orders placed at 1pm). Dr. Meda Coffee to read later.  She does not have to wait on the results of the coronary CTA before discharge. Could be discharged home later today after her CTA from a cardiac standpoint. Dr. Meda Coffee will call her with results of her CTA.   3. Paroxysmal atrial fibrillation: Sinus today. No changes.    Signed: Secord Blanks, MD 04/10/2014, 1:10 PM

## 2014-04-11 DIAGNOSIS — I359 Nonrheumatic aortic valve disorder, unspecified: Secondary | ICD-10-CM

## 2014-04-11 DIAGNOSIS — J4489 Other specified chronic obstructive pulmonary disease: Secondary | ICD-10-CM

## 2014-04-11 DIAGNOSIS — D649 Anemia, unspecified: Secondary | ICD-10-CM

## 2014-04-11 DIAGNOSIS — J449 Chronic obstructive pulmonary disease, unspecified: Secondary | ICD-10-CM

## 2014-04-11 DIAGNOSIS — I25810 Atherosclerosis of coronary artery bypass graft(s) without angina pectoris: Secondary | ICD-10-CM

## 2014-04-11 DIAGNOSIS — I059 Rheumatic mitral valve disease, unspecified: Secondary | ICD-10-CM

## 2014-04-11 DIAGNOSIS — I4891 Unspecified atrial fibrillation: Secondary | ICD-10-CM

## 2014-04-11 LAB — CBC WITH DIFFERENTIAL/PLATELET
BASOS PCT: 1 % (ref 0–1)
Basophils Absolute: 0 10*3/uL (ref 0.0–0.1)
Eosinophils Absolute: 0.2 10*3/uL (ref 0.0–0.7)
Eosinophils Relative: 3 % (ref 0–5)
HEMATOCRIT: 30.3 % — AB (ref 36.0–46.0)
HEMOGLOBIN: 9.6 g/dL — AB (ref 12.0–15.0)
LYMPHS ABS: 2.1 10*3/uL (ref 0.7–4.0)
Lymphocytes Relative: 33 % (ref 12–46)
MCH: 27.2 pg (ref 26.0–34.0)
MCHC: 31.7 g/dL (ref 30.0–36.0)
MCV: 85.8 fL (ref 78.0–100.0)
MONO ABS: 0.4 10*3/uL (ref 0.1–1.0)
MONOS PCT: 6 % (ref 3–12)
NEUTROS ABS: 3.7 10*3/uL (ref 1.7–7.7)
Neutrophils Relative %: 57 % (ref 43–77)
Platelets: 190 10*3/uL (ref 150–400)
RBC: 3.53 MIL/uL — ABNORMAL LOW (ref 3.87–5.11)
RDW: 16 % — ABNORMAL HIGH (ref 11.5–15.5)
WBC: 6.4 10*3/uL (ref 4.0–10.5)

## 2014-04-11 LAB — LIPID PANEL
Cholesterol: 118 mg/dL (ref 0–200)
HDL: 46 mg/dL (ref >39)
LDL CALC: 45 mg/dL (ref 0–99)
Total CHOL/HDL Ratio: 2.6 RATIO
Triglycerides: 137 mg/dL (ref <150)
VLDL: 27 mg/dL (ref 0–40)

## 2014-04-11 LAB — COMPREHENSIVE METABOLIC PANEL
ALT: 12 U/L (ref 0–35)
AST: 16 U/L (ref 0–37)
Albumin: 3.1 g/dL — ABNORMAL LOW (ref 3.5–5.2)
Alkaline Phosphatase: 64 U/L (ref 39–117)
BUN: 18 mg/dL (ref 6–23)
CALCIUM: 9.9 mg/dL (ref 8.4–10.5)
CO2: 25 mEq/L (ref 19–32)
CREATININE: 0.92 mg/dL (ref 0.50–1.10)
Chloride: 106 mEq/L (ref 96–112)
GFR calc Af Amer: 70 mL/min — ABNORMAL LOW (ref >90)
GFR, EST NON AFRICAN AMERICAN: 61 mL/min — AB (ref >90)
GLUCOSE: 111 mg/dL — AB (ref 70–99)
Potassium: 4.5 mEq/L (ref 3.7–5.3)
Sodium: 144 mEq/L (ref 137–147)
TOTAL PROTEIN: 6.1 g/dL (ref 6.0–8.3)
Total Bilirubin: 0.3 mg/dL (ref 0.3–1.2)

## 2014-04-11 LAB — GLUCOSE, CAPILLARY
GLUCOSE-CAPILLARY: 114 mg/dL — AB (ref 70–99)
Glucose-Capillary: 97 mg/dL (ref 70–99)

## 2014-04-11 MED ORDER — DILTIAZEM HCL ER COATED BEADS 120 MG PO CP24
120.0000 mg | ORAL_CAPSULE | Freq: Every day | ORAL | Status: DC
  Filled 2014-04-11: qty 1

## 2014-04-11 MED ORDER — INSULIN ASPART 100 UNIT/ML ~~LOC~~ SOLN: 0.0000 [IU] | Freq: Every day | SUBCUTANEOUS | Status: DC

## 2014-04-11 MED ORDER — LIVING WELL WITH DIABETES BOOK
Freq: Once | Status: AC
  Administered 2014-04-11: 21:00:00
  Filled 2014-04-11: qty 1

## 2014-04-11 MED ORDER — INSULIN ASPART 100 UNIT/ML ~~LOC~~ SOLN
0.0000 [IU] | Freq: Three times a day (TID) | SUBCUTANEOUS | Status: DC
Start: 2014-04-11 — End: 2014-04-13

## 2014-04-11 MED ORDER — METOPROLOL TARTRATE 25 MG PO TABS
25.0000 mg | ORAL_TABLET | Freq: Two times a day (BID) | ORAL | Status: DC
  Administered 2014-04-11 – 2014-04-13 (×5): 25 mg via ORAL
  Filled 2014-04-11 (×6): qty 1

## 2014-04-11 MED ORDER — AMIODARONE HCL 200 MG PO TABS
400.0000 mg | ORAL_TABLET | Freq: Every day | ORAL | Status: DC
  Administered 2014-04-11 – 2014-04-13 (×3): 400 mg via ORAL
  Filled 2014-04-11 (×3): qty 2

## 2014-04-11 NOTE — Progress Notes (Signed)
Patient: [name] / Admit Date: 04/09/2014 / Date of Encounter: 04/11/2014, 9:08 AM  Subjective  No complaints. No CP or SOB. She does not necessary feel significant symptoms from her AF, only in retrospect does she think she felt funny.  Objective   Telemetry: NSR this AM  Physical Exam: Blood pressure 106/62, pulse 90, temperature 97.8 F (36.6 C), temperature source Oral, resp. rate 18, height 5' 2.5" (1.588 m), weight 152 lb 9.6 oz (69.219 kg), SpO2 95.00%. General: Well developed, well nourished WF in no acute distress. Head: Normocephalic, atraumatic, sclera non-icteric, no xanthomas, nares are without discharge. Neck: Bilateral carotid bruits. JVP not elevated. Lungs: Clear bilaterally to auscultation without wheezes, rales, or rhonchi. Breathing is unlabored. Heart: RRR S1 S2, 2/6 SEM without rubs or gallops.  Abdomen: Soft, non-tender, non-distended with normoactive bowel sounds. No rebound/guarding. Extremities: No clubbing or cyanosis. No edema.  Neuro: Alert and oriented X 3. Moves all extremities spontaneously. Psych:  Responds to questions appropriately with a normal affect.   Intake/Output Summary (Last 24 hours) at 04/11/14 0908 Last data filed at 04/11/14 0904  Gross per 24 hour  Intake 1923.75 ml  Output   3150 ml  Net -1226.25 ml    Inpatient Medications:  . atorvastatin  40 mg Oral QPM  . cilostazol  100 mg Oral BID  . diltiazem  120 mg Oral Daily  . gabapentin  600 mg Oral TID  . metoprolol tartrate  25 mg Oral BID  . rivaroxaban  20 mg Oral Q supper  . sertraline  50 mg Oral Daily  . sodium chloride  3 mL Intravenous Q12H   Infusions:  . 0.9 % NaCl with KCl 20 mEq / L 75 mL/hr at 04/11/14 0250    Labs:  Recent Labs  04/10/14 0129 04/11/14 0408  NA 142 144  K 3.0* 4.5  CL 99 106  CO2 28 25  GLUCOSE 104* 111*  BUN 28* 18  CREATININE 1.20* 0.92  CALCIUM 10.1 9.9    Recent Labs  04/10/14 0129 04/11/14 0408  AST 18 16  ALT 13 12   ALKPHOS 68 64  BILITOT 0.3 0.3  PROT 6.2 6.1  ALBUMIN 3.1* 3.1*    Recent Labs  04/10/14 0129 04/11/14 0408  WBC 7.0 6.4  NEUTROABS 4.2 3.7  HGB 9.5* 9.6*  HCT 29.9* 30.3*  MCV 83.8 85.8  PLT 170 190    Recent Labs  04/09/14 1644 04/09/14 2101 04/10/14 0128 04/10/14 0707  TROPONINI <0.30 <0.30 <0.30 <0.30    Recent Labs  04/10/14 0129  HGBA1C 6.6*     Radiology/Studies:  Dg Chest 2 View  04/09/2014   CLINICAL DATA:  Loss of consciousness  EXAM: CHEST  2 VIEW  COMPARISON:  03/08/2014  FINDINGS: Lungs are essentially clear. No focal consolidation. No pleural effusion or pneumothorax.  The heart is normal in size. Postsurgical changes related to prior CABG.  Mild degenerative changes of the visualized thoracolumbar spine.  IMPRESSION: No evidence of acute cardiopulmonary disease.   Electronically Signed   By: Julian Hy M.D.   On: 04/09/2014 18:42   Ct Head Wo Contrast  04/09/2014   CLINICAL DATA:  Loss of consciousness  EXAM: CT HEAD WITHOUT CONTRAST  TECHNIQUE: Contiguous axial images were obtained from the base of the skull through the vertex without intravenous contrast.  COMPARISON:  None.  FINDINGS: There is calcification of the carotid arteries. There is vertebrobasilar calcification as well. There is moderate diffuse atrophy. There  is mild low attenuation in the deep white matter consistent with chronic involutional change. There is no evidence of vascular territory infarct. There is a prominent perivascular space in the medial left temporal lobe. This is an incidental finding. There is no evidence of parenchymal hemorrhage or extra-axial fluid. The calvarium is intact with no significant inflammatory change in the visualized sinuses.  IMPRESSION: Age-related atrophy and involutional change with no acute findings.   Electronically Signed   By: Skipper Cliche M.D.   On: 04/09/2014 17:55   Mr Brain Wo Contrast  04/10/2014   CLINICAL DATA:  Presyncope.  Headache.   The examination had to be discontinued prior to completion due to nausea and anxiety.  EXAM: MRI HEAD WITHOUT CONTRAST  TECHNIQUE: Multiplanar, multiecho pulse sequences of the brain and surrounding structures were obtained without intravenous contrast.  COMPARISON:  CT head without contrast 04/09/2014.  FINDINGS: An acute nonhemorrhagic when infarct is present in the right parietal lobe measuring 10 mm. Focal T2 hyperintensity is associated.  Mild generalized atrophy is present. No hemorrhage or mass lesion is evident. The ventricles are of normal size. No significant extra-axial fluid collection is present.  Flow is present in the major intracranial arteries. The patient is status post bilateral lens extractions. The paranasal sinuses and mastoid air cells are clear.  IMPRESSION: 1. 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   Electronically Signed   By: Lawrence Santiago M.D.   On: 04/10/2014 18:39     Assessment and Plan  1. Pre-syncope/fall without dizziness or palpitations before fall, possible slurred speech - do not believe fall was acutely cardiac related, however, MRI yesterday PM demonstrating acute nonhemorrhagic infarct.   2. Acute nonhemorrhagic parietal CVA - occurring in the setting of Xarelto use. Neuro OK with continuing Xarelto per their note. See below re: PAD.  2. CAD s/p CABG 2001 - Recent abnormal stress test and cath with 2 patent bypasses but unable to visualize LIMA graft given prior left carotid to subclavian bypass. Plan was made for cardiac CT however yesterday patient's BP was low and she was unable to get the lopressor/NTG for her coronary CT, thus procedure we held. Per d/w Dr. Meda Coffee, would not do today due to borderline BP (would need to use NTG which would lower BP further). Will reconsider tomorrow.  3. SVT/Paroxysmal atrial flutter/PAF - pt had not gotten her BB yesterday due to hypotension. IV Lopressor was used along with dilt drip. Lopressor  25mg  BID (lower than home dose) ordered for this AM along with Diltiazem 120mg . BP is borderline low so in setting of stroke will d/c dilitazem and use Lopressor only. At this point she would benefit from AA therapy given inability to titrate rate controlling agents due to hypotension, and possible further hypotension as a result of rapid AF. Per d/w MD, will initiate amiodarone 400mg  daily for now. Baseline TSH and LFTs ok. LA size 60mm in 2014. 2D echo pending.  4. Hypotension - tx with IVF. Lasix, HCTZ, ARB held. See above.  5. Mild aortic stenosis by echo 01/2013 - echo pending.  6. PAD (LE bypass, left carotid to subclavian bypass, CAD) - 66-06% LICA, 3-01% RICA, Progression of innominate artery stenosis, with parvus tardus flow noted throughout the right carotid system and arm. Dr. Francesca Oman nurse is helping arrange vascular followup. Have asked vascular to see this adm.  7. Newly recognized diabetes mellitus A1C 6.6-6.9 - consider initiation of rx given stroke and CAD.  8. Anemia ?iron deficiency - iron level ? at 29, ? TIBC, ? % sat - further w/u per IM. Note she is on both Xarelto and Pletal (scheduled aspirin was dc'd in March but apparently the patient takes occasional tablet PRN pain). Also on meloxicam at home which will need to be discontinued.   9. Hypercalcemia - improved with hydration. PTH low but total calcium high. Significance per IM.  Signed, Melina Copa PA-C  Patient seen and examined  Agree with findings of D Dunn.  She is in/out of afib  WOulld recomm amio to keep in SR  With vascular disease I do not think she will tolerate.  CT (coronary) postponed with CVA.  Echo  Watch BP  Fay Records

## 2014-04-11 NOTE — Progress Notes (Signed)
[facility] PROGRESS NOTE  [name] IRS:[id number] DOB: 11/07/1941 DOA: 04/09/2014 PCP: Nyoka Cowden, MD  Assessment/Plan: Acute infarct, right parietal lobe -Per MRI 5/13 -Continue  Xarelto per neurology (does not need to be on aspirin in addition per Dr. Leonie Man) -await echo, FLP,A1C. she just had carotid Dopplers done on May 7>> per cardiology showed subclavian stenosis>> vascular surgery consulted per cardiology and will eval -Consult neuro for further recommendations -Consult PT OT ST Diabetes mellitus-new A1c elevated 6.9, will change to modify carb diet -Monitor Accu-Cheks and cover with sliding scale insulin -follow and discuss starting oral meds with patient versus outpatient followup with her PCP to start  Subclavian stenosis -Await vascular eval as above CAD/CHF/atrial flutter -Cardiac enzymes negative -Per cardiology would not do coronary CT today due to borderline BP (would need to use NTG which would lower BP further)>> cards to reconsider tomorrow. Anemia -Hemoglobin is 9.5 with no gross evidence of bleeding  AKI -Improved with hydration Peripheral vascular disease -Continue Pletal  Hypercalcemia -Resolved with hydration Hypokalemia  - resolved, replace Code Status: Full Family Communication: None are at the bedside  Disposition Plan: To home when medically ready   Consultants:  Cardiology  Procedures:  None  Antibiotics:  None   HPI/Subjective: she denies chest pain and dizziness, also denies focal weakness. Objective: Filed Vitals:   04/11/14 1126  BP: 160/70  Pulse:   Temp:   Resp:     Intake/Output Summary (Last 24 hours) at 04/11/14 1349 Last data filed at 04/11/14 0904  Gross per 24 hour  Intake 1923.75 ml  Output   2150 ml  Net -226.25 ml   Filed Weights   04/09/14 2046 04/10/14 0515 04/11/14 0400  Weight: 68.675 kg (151 lb 6.4 oz) 68.675 kg (151 lb 6.4 oz) 69.219 kg (152 lb 9.6 oz)    Exam:  General:  alert & oriented x 3 In NAD Cardiovascular: RRR, nl S1 s2 Respiratory: CTAB Abdomen: soft +BS NT/ND, no masses palpable Extremities: No cyanosis and no edema  Neuro: Alert and oriented x3 creatinine is 2-12 grossly intact strength is normal and symmetric. Sensory grossly intact    Data Reviewed: Basic Metabolic Panel:  Recent Labs Lab 04/09/14 1644 04/09/14 2101 04/10/14 0129 04/11/14 0408  NA 139  --  142 144  K 3.5*  --  3.0* 4.5  CL 96  --  99 106  CO2 26  --  28 25  GLUCOSE 98  --  104* 111*  BUN 30*  --  28* 18  CREATININE 1.33*  --  1.20* 0.92  CALCIUM 11.2* 10.8* 10.1 9.9   Liver Function Tests:  Recent Labs Lab 04/09/14 1644 04/10/14 0129 04/11/14 0408  AST 21 18 16   ALT 16 13 12   ALKPHOS 83 68 64  BILITOT 0.2* 0.3 0.3  PROT 7.5 6.2 6.1  ALBUMIN 3.8 3.1* 3.1*   No results found for this basename: LIPASE, AMYLASE,  in the last 168 hours No results found for this basename: AMMONIA,  in the last 168 hours CBC:  Recent Labs Lab 04/09/14 1644 04/10/14 0129 04/11/14 0408  WBC 7.6 7.0 6.4  NEUTROABS 4.7 4.2 3.7  HGB 10.8* 9.5* 9.6*  HCT 33.8* 29.9* 30.3*  MCV 83.9 83.8 85.8  PLT 188 170 190   Cardiac Enzymes:  Recent Labs Lab 04/09/14 1644 04/09/14 2101 04/10/14 0128 04/10/14 0707  TROPONINI <0.30 <0.30 <0.30 <0.30   BNP (last 3 results)  Recent Labs  04/09/14 2101  PROBNP 678.0*  CBG:  Recent Labs Lab 04/09/14 1700  GLUCAP 96    No results found for this or any previous visit (from the past 240 hour(s)).   Studies: Dg Chest 2 View  04/09/2014   CLINICAL DATA:  Loss of consciousness  EXAM: CHEST  2 VIEW  COMPARISON:  03/08/2014  FINDINGS: Lungs are essentially clear. No focal consolidation. No pleural effusion or pneumothorax.  The heart is normal in size. Postsurgical changes related to prior CABG.  Mild degenerative changes of the visualized thoracolumbar spine.  IMPRESSION: No evidence of acute cardiopulmonary disease.    Electronically Signed   By: Julian Hy M.D.   On: 04/09/2014 18:42   Ct Head Wo Contrast  04/09/2014   CLINICAL DATA:  Loss of consciousness  EXAM: CT HEAD WITHOUT CONTRAST  TECHNIQUE: Contiguous axial images were obtained from the base of the skull through the vertex without intravenous contrast.  COMPARISON:  None.  FINDINGS: There is calcification of the carotid arteries. There is vertebrobasilar calcification as well. There is moderate diffuse atrophy. There is mild low attenuation in the deep white matter consistent with chronic involutional change. There is no evidence of vascular territory infarct. There is a prominent perivascular space in the medial left temporal lobe. This is an incidental finding. There is no evidence of parenchymal hemorrhage or extra-axial fluid. The calvarium is intact with no significant inflammatory change in the visualized sinuses.  IMPRESSION: Age-related atrophy and involutional change with no acute findings.   Electronically Signed   By: Skipper Cliche M.D.   On: 04/09/2014 17:55   Mr Brain Wo Contrast  04/10/2014   CLINICAL DATA:  Presyncope.  Headache.  The examination had to be discontinued prior to completion due to nausea and anxiety.  EXAM: MRI HEAD WITHOUT CONTRAST  TECHNIQUE: Multiplanar, multiecho pulse sequences of the brain and surrounding structures were obtained without intravenous contrast.  COMPARISON:  CT head without contrast 04/09/2014.  FINDINGS: An acute nonhemorrhagic when infarct is present in the right parietal lobe measuring 10 mm. Focal T2 hyperintensity is associated.  Mild generalized atrophy is present. No hemorrhage or mass lesion is evident. The ventricles are of normal size. No significant extra-axial fluid collection is present.  Flow is present in the major intracranial arteries. The patient is status post bilateral lens extractions. The paranasal sinuses and mastoid air cells are clear.  IMPRESSION: 1. Acute nonhemorrhagic infarct  of the right parietal lobe measures 10 mm.   Electronically Signed   By: Lawrence Santiago M.D.   On: 04/10/2014 18:39    Scheduled Meds: . amiodarone  400 mg Oral Daily  . atorvastatin  40 mg Oral QPM  . cilostazol  100 mg Oral BID  . gabapentin  600 mg Oral TID  . metoprolol tartrate  25 mg Oral BID  . rivaroxaban  20 mg Oral Q supper  . sertraline  50 mg Oral Daily  . sodium chloride  3 mL Intravenous Q12H   Continuous Infusions: . 0.9 % NaCl with KCl 20 mEq / L Stopped (04/11/14 0900)    Active Problems:   Pre-syncope   Syncope   CVA (cerebral infarction)    Time spent: [facility] Pager [phone]. If 7PM-7AM, please contact night-coverage at [email or web address], password [facility] 04/11/2014, 1:49 PM  LOS: 2 days

## 2014-04-11 NOTE — Progress Notes (Signed)
Occupational Therapy Evaluation Patient Details Name: [name] MRN: [id number] DOB: [birth year] Today's Date: 04/11/2014    History of Present Illness 73 y.o. female with history of atrial flutter on Xarelto, coronary artery disease with CABG, hypertension and hyperlipidemia, admitted on 04/09/2014 following an episode of near-syncope. MRI revealed small parietal infarct.   Clinical Impression   PTA, pt lived alone independently. Pt appears @ baseline with ADL. Educated on stroke signs/symptoms. Discussed home safety and fall prevention. At this time. Recommend pt D/C home when medically stable with initial 24/7 S. No further OT needed.     Follow Up Recommendations  No OT follow up;Supervision/Assistance - 24 hour (initial 24/7)    Equipment Recommendations  Tub/shower seat;Other (comment) (tub grab bar)    Recommendations for Other Services       Precautions / Restrictions Precautions Precautions: Fall Restrictions Weight Bearing Restrictions: No      Mobility Bed Mobility Overal bed mobility: Modified Independent             General bed mobility comments: increased time to perform  Transfers Overall transfer level: Modified independent Equipment used: None             General transfer comment: increased time to perform    Balance                                 Standardized Balance Assessment Standardized Balance Assessment : Berg Balance Test Berg Balance Test Sit to Stand: Able to stand using hands after several tries Standing Unsupported: Able to stand safely 2 minutes Sitting with Back Unsupported but Feet Supported on Floor or Stool: Able to sit safely and securely 2 minutes Stand to Sit: Sits safely with minimal use of hands Transfers: Able to transfer safely, minor use of hands Standing Unsupported with Eyes Closed: Able to stand 10 seconds with supervision Standing Ubsupported with Feet Together: Able to place feet together  independently but unable to hold for 30 seconds Standing Unsupported, One Foot in Front: Able to take small step independently and hold 30 seconds Standing on One Leg: Able to lift leg independently and hold equal to or more than 3 seconds Dynamic Gait Index Level Surface: Mild Impairment Change in Gait Speed: Mild Impairment Gait with Horizontal Head Turns: Normal Gait with Vertical Head Turns: Normal Step Over Obstacle: Mild Impairment      ADL Overall ADL's : Needs assistance/impaired                                     Functional mobility during ADLs: Supervision/safety General ADL Comments: recommend general S for ADL     Vision                     Perception Perception Perception Tested?: Yes Comments: no apparent deficits   Praxis Praxis Praxis tested?: Within functional limits    Pertinent Vitals/Pain No c/o pain     Hand Dominance Right   Extremity/Trunk Assessment Upper Extremity Assessment Upper Extremity Assessment: Overall WFL for tasks assessed   Lower Extremity Assessment Lower Extremity Assessment: Overall WFL for tasks assessed   Cervical / Trunk Assessment Cervical / Trunk Assessment: Normal   Communication Communication Communication:  (glasses readers)   Cognition Arousal/Alertness: Awake/alert Behavior During Therapy: WFL for tasks assessed/performed Overall Cognitive Status: Within Functional Limits for tasks  assessed                     General Comments       Exercises       Shoulder Instructions      Home Living Family/patient expects to be discharged to:: Private residence Living Arrangements: Alone Available Help at Discharge: Family Type of Home: House Home Access: Stairs to enter Technical brewer of Steps: 3 Entrance Stairs-Rails: Can reach both [place]: One level     Bathroom Shower/Tub: Tub/shower unit Shower/tub characteristics: Architectural technologist: Standard Bathroom  Accessibility: Yes How Accessible: Accessible via walker Home Equipment: Gilford Rile - 2 wheels;Grab bars - tub/shower          Prior Functioning/Environment Level of Independence: Independent             OT Diagnosis:     OT Problem List:     OT Treatment/Interventions:      OT Goals(Current goals can be found in the care plan section) Acute Rehab OT Goals Patient Stated Goal: to go home soon Potential to Achieve Goals:  (eval only)  OT Frequency:     Barriers to D/C:            Co-evaluation              End of Session Nurse Communication: Mobility status  Activity Tolerance: Patient tolerated treatment well Patient left: in chair;with call bell/phone within reach;with family/visitor present   Time: 9518-8416 OT Time Calculation (min): 15 min Charges:  OT General Charges $OT Visit: 1 Procedure OT Evaluation $Initial OT Evaluation Tier I: 1 Procedure OT Treatments $Self Care/Home Management : 8-22 mins G-Codes:    Roney Jaffe Bettyjean Stefanski Apr 18, 2014, 11:28 AM   Maurie Boettcher, OTR/L  [phone] Apr 18, 2014

## 2014-04-11 NOTE — Evaluation (Signed)
Physical Therapy Evaluation Patient Details Name: [name] MRN: [id number] DOB: May 10, 1941 Today's Date: 04/11/2014   History of Present Illness  73 y.o. female with history of atrial flutter on Xarelto, coronary artery disease with CABG, hypertension and hyperlipidemia, admitted on 04/09/2014 following an episode of near-syncope. MRI revealed small parietal infarct.  Clinical Impression  Patient demonstrates deficits in mobility as indicated below. Will benefit from continued skilled PT acutely to address deficits and maximize function.  Will also continue to reinforce education with patient re: Stroke. At this time, do not feel patient needs PT follow up upon discharge.    Follow Up Recommendations No PT follow up    Equipment Recommendations  None recommended by PT    Recommendations for Other Services       Precautions / Restrictions Precautions Precautions: Fall Restrictions Weight Bearing Restrictions: No      Mobility  Bed Mobility Overal bed mobility: Modified Independent             General bed mobility comments: increased time to perform  Transfers Overall transfer level: Modified independent Equipment used: None             General transfer comment: increased time to perform  Ambulation/Gait Ambulation/Gait assistance: Supervision Ambulation Distance (Feet): 230 Feet Assistive device: None Gait Pattern/deviations: Step-through pattern;Decreased stride length;Narrow base of support Gait velocity: initially decreased, improved with cues Gait velocity interpretation: Below normal speed for age/gender General Gait Details: steady with ambulation   Stairs            Wheelchair Mobility    Modified Rankin (Stroke Patients Only)       Balance                                 Standardized Balance Assessment Standardized Balance Assessment : Berg Balance Test Berg Balance Test Sit to Stand: Able to stand using hands  after several tries Standing Unsupported: Able to stand safely 2 minutes Sitting with Back Unsupported but Feet Supported on Floor or Stool: Able to sit safely and securely 2 minutes Stand to Sit: Sits safely with minimal use of hands Transfers: Able to transfer safely, minor use of hands Standing Unsupported with Eyes Closed: Able to stand 10 seconds with supervision Standing Ubsupported with Feet Together: Able to place feet together independently but unable to hold for 30 seconds Standing Unsupported, One Foot in Front: Able to take small step independently and hold 30 seconds Standing on One Leg: Able to lift leg independently and hold equal to or more than 3 seconds Dynamic Gait Index Level Surface: Mild Impairment Change in Gait Speed: Mild Impairment Gait with Horizontal Head Turns: Normal Gait with Vertical Head Turns: Normal Step Over Obstacle: Mild Impairment       Pertinent Vitals/Pain VSS, HR remained 80's and NSR with mobility, no pain reported    Home Living Family/patient expects to be discharged to:: Private residence Living Arrangements: Alone Available Help at Discharge: Family Type of Home: House Home Access: Stairs to enter Entrance Stairs-Rails: Can reach both Entrance Stairs-Number of Steps: 3 Home Layout: One level Home Equipment: Walker - 2 wheels      Prior Function Level of Independence: Independent               Hand Dominance   Dominant Hand: Right    Extremity/Trunk Assessment   Upper Extremity Assessment: Overall WFL for tasks assessed  Lower Extremity Assessment: Overall WFL for tasks assessed         Communication   Communication:  (glasses readers)  Cognition Arousal/Alertness: Awake/alert Behavior During Therapy: WFL for tasks assessed/performed Overall Cognitive Status: Within Functional Limits for tasks assessed                      General Comments General comments (skin integrity, edema, etc.):  Spoke with patient and family and educated extensively on BE FAST signs and symtpoms of stroke.     Exercises        Assessment/Plan    PT Assessment Patient needs continued PT services  PT Diagnosis Difficulty walking;Abnormality of gait   PT Problem List Decreased activity tolerance;Decreased balance;Decreased mobility  PT Treatment Interventions DME instruction;Gait training;Stair training;Functional mobility training;Therapeutic activities;Therapeutic exercise;Balance training;Patient/family education   PT Goals (Current goals can be found in the Care Plan section) Acute Rehab PT Goals Patient Stated Goal: to go home soon PT Goal Formulation: With patient/family Time For Goal Achievement: 04/25/14 Potential to Achieve Goals: Good    Frequency Min 3X/week   Barriers to discharge        Co-evaluation               End of Session Equipment Utilized During Treatment: Gait belt Activity Tolerance: Patient tolerated treatment well Patient left: in bed;with call bell/phone within reach;Other (comment) (sitting EOB with family and occupational therapist in room) Nurse Communication: Mobility status    Functional Assessment Tool Used: clinical judgement Functional Limitation: Mobility: Walking and moving around Mobility: Walking and Moving Around Current Status ([id number]): At least 1 percent but less than 20 percent impaired, limited or restricted Mobility: Walking and Moving Around Goal Status [phone]): 0 percent impaired, limited or restricted    Time: 0916-0942 PT Time Calculation (min): 26 min   Charges:   PT Evaluation $Initial PT Evaluation Tier I: 1 Procedure PT Treatments $Gait Training: 8-22 mins $Self Care/Home Management: 8-22   PT G Codes:   Functional Assessment Tool Used: clinical judgement Functional Limitation: Mobility: Walking and moving around    [employer] 04/11/2014, 9:57 AM Alben Deeds, PT DPT  [phone]

## 2014-04-11 NOTE — Progress Notes (Addendum)
Shift event: RN Ananias Pilgrim paged this NP earlier in shift secondary to pt being in Afib with RVR in 130s confirmed by tele and EKG. For some reason, another RN had paged cardiology concerning this, but cardiology has not consulted on this pt.  Ananias Pilgrim, RN, called this NP to state cardio had ordered Lopressor x 1 dose and "if doesn't work, transfer pt to SDU for cardizem drip". The pt is stable otherwise, has a known hx of paroxysmal Afib, and is asymptomatic. 3W is capable to handle cardizem drip and titrate as needed. Cardizem drip started around 2130 hrs.  Spoke to [place], Therapist, sports at 0100 hrs. Pt just went into NSR on 5mg /hr Cardizem with HR 80s, confirmed by 12 lead EKG. Orders to Jalesa to wean Cardizem gtt to 2.5/hr and page me in one hour with HR/rhythm and BP. If still in NSR and otherwise stable, can convert to po Cardizem at that time. Clance Boll, NP [facility] Update: Pt remains in NSR with HR in the 80s after weaning the Cardizem gtt to 2.5/hr. Will d/c drip and place pt back on her Cardizem CD 120mg  daily.  KJKG, NP

## 2014-04-11 NOTE — Progress Notes (Addendum)
Stroke Team Progress Note  HISTORY [name] is an 73 y.o. female with history of atrial flutter on Xarelto, coronary artery disease with CABG, hypertension and hyperlipidemia, admitted on 04/09/2014 following an episode of near-syncope. Patient recalls bending over to pick up a newspaper and continued to the ground. It's unclear how long she was unable to get back to her feet. The episode occurred around 11:15 AM yesterday. As no previous history of stroke nor TIA. CT scan of her head yesterday showed no signs of acute intracranial abnormality. 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 NIH stroke score at the time of this evaluation was 0. Patient had no focal deficits on admission.  LSN: 11:15 AM on 04/09/2014  tPA Given: No: No clear deficits and beyond time window for treatment consideration  MRankin: 0   SUBJECTIVE Her  family is at the bedside.  Overall she feels her condition is completely resolved.  She denies loss of consciousness, loss of memory, she states that she did have a headache and some blurred vision prior to the episode and she got out of the car and fell down all of a sudden. She denies prior history suggestive of vertebrobasilar Steele, dizziness, vertigo related to head or neck movements. She had a carotid ultrasound done  At [facility] on 04/04/14 o which showed heterogeneous plaque, bilaterally. Progression of innominate artery stenosis, with parvus tardus flow noted throughout the right carotid system and arm. RICA has decreased from prior exam, now in the 1-39% range. Essentially stable 34-19% LICA stenosis. Patent left CCA to subclavian artery bypass graft. Patent and antegrade bilateral vertebral arteries, with atypical flow on the right.    OBJECTIVE Most recent Vital Signs: Filed Vitals:   04/11/14 0806 04/11/14 1126 04/11/14 1200 04/11/14 1617  BP: 106/62 160/70 146/52 140/44  Pulse: 90  73 72  Temp: 97.8 F (36.6  C)  98 F (36.7 C) 97.5 F (36.4 C)  TempSrc: Oral  Oral Oral  Resp: 18  18 18   Height:      Weight:      SpO2: 95%  89% 93%   CBG (last 3)   Recent Labs  04/09/14 1700  GLUCAP 96    IV Fluid Intake:   . 0.9 % NaCl with KCl 20 mEq / L Stopped (04/11/14 0900)    MEDICATIONS  . amiodarone  400 mg Oral Daily  . atorvastatin  40 mg Oral QPM  . cilostazol  100 mg Oral BID  . gabapentin  600 mg Oral TID  . insulin aspart  0-5 Units Subcutaneous QHS  . insulin aspart  0-9 Units Subcutaneous TID WC  . metoprolol tartrate  25 mg Oral BID  . rivaroxaban  20 mg Oral Q supper  . sertraline  50 mg Oral Daily  . sodium chloride  3 mL Intravenous Q12H   PRN:  HYDROcodone-acetaminophen, LORazepam, promethazine  Diet:  Carb Control   Activity:  Bedrest  DVT Prophylaxis:  xarelto  CLINICALLY SIGNIFICANT STUDIES Basic Metabolic Panel:  Recent Labs Lab 04/10/14 0129 04/11/14 0408  NA 142 144  K 3.0* 4.5  CL 99 106  CO2 28 25  GLUCOSE 104* 111*  BUN 28* 18  CREATININE 1.20* 0.92  CALCIUM 10.1 9.9   Liver Function Tests:  Recent Labs Lab 04/10/14 0129 04/11/14 0408  AST 18 16  ALT 13 12  ALKPHOS 68 64  BILITOT 0.3 0.3  PROT 6.2 6.1  ALBUMIN  3.1* 3.1*   CBC:  Recent Labs Lab 04/10/14 0129 04/11/14 0408  WBC 7.0 6.4  NEUTROABS 4.2 3.7  HGB 9.5* 9.6*  HCT 29.9* 30.3*  MCV 83.8 85.8  PLT 170 190   Coagulation: No results found for this basename: LABPROT, INR,  in the last 168 hours Cardiac Enzymes:  Recent Labs Lab 04/09/14 2101 04/10/14 0128 04/10/14 0707  TROPONINI <0.30 <0.30 <0.30   Urinalysis: No results found for this basename: COLORURINE, APPERANCEUR, LABSPEC, [place], GLUCOSEU, HGBUR, BILIRUBINUR, KETONESUR, PROTEINUR, UROBILINOGEN, NITRITE, LEUKOCYTESUR,  in the last 168 hours Lipid Panel    Component Value Date/Time   CHOL 118 04/11/2014 0408   TRIG 137 04/11/2014 0408   HDL 46 04/11/2014 0408   CHOLHDL 2.6 04/11/2014 0408   VLDL 27 04/11/2014  0408   LDLCALC 45 04/11/2014 0408   HgbA1C  Lab Results  Component Value Date   HGBA1C 6.6* 04/10/2014    Urine Drug Screen:   No results found for this basename: labopia, cocainscrnur, labbenz, amphetmu, thcu, labbarb    Alcohol Level: No results found for this basename: ETH,  in the last 168 hours  Dg Chest 2 View  04/09/2014   CLINICAL DATA:  Loss of consciousness  EXAM: CHEST  2 VIEW  COMPARISON:  03/08/2014  FINDINGS: Lungs are essentially clear. No focal consolidation. No pleural effusion or pneumothorax.  The heart is normal in size. Postsurgical changes related to prior CABG.  Mild degenerative changes of the visualized thoracolumbar spine.  IMPRESSION: No evidence of acute cardiopulmonary disease.   Electronically Signed   By: Julian Hy M.D.   On: 04/09/2014 18:42   Ct Head Wo Contrast  04/09/2014   CLINICAL DATA:  Loss of consciousness  EXAM: CT HEAD WITHOUT CONTRAST  TECHNIQUE: Contiguous axial images were obtained from the base of the skull through the vertex without intravenous contrast.  COMPARISON:  None.  FINDINGS: There is calcification of the carotid arteries. There is vertebrobasilar calcification as well. There is moderate diffuse atrophy. There is mild low attenuation in the deep white matter consistent with chronic involutional change. There is no evidence of vascular territory infarct. There is a prominent perivascular space in the medial left temporal lobe. This is an incidental finding. There is no evidence of parenchymal hemorrhage or extra-axial fluid. The calvarium is intact with no significant inflammatory change in the visualized sinuses.  IMPRESSION: Age-related atrophy and involutional change with no acute findings.   Electronically Signed   By: Skipper Cliche M.D.   On: 04/09/2014 17:55   Mr Brain Wo Contrast  04/10/2014   CLINICAL DATA:  Presyncope.  Headache.  The examination had to be discontinued prior to completion due to nausea and anxiety.  EXAM:  MRI HEAD WITHOUT CONTRAST  TECHNIQUE: Multiplanar, multiecho pulse sequences of the brain and surrounding structures were obtained without intravenous contrast.  COMPARISON:  CT head without contrast 04/09/2014.  FINDINGS: An acute nonhemorrhagic when infarct is present in the right parietal lobe measuring 10 mm. Focal T2 hyperintensity is associated.  Mild generalized atrophy is present. No hemorrhage or mass lesion is evident. The ventricles are of normal size. No significant extra-axial fluid collection is present.  Flow is present in the major intracranial arteries. The patient is status post bilateral lens extractions. The paranasal sinuses and mastoid air cells are clear.  IMPRESSION: 1. 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   Electronically Signed   By: Bretta Bang.D.  On: 04/10/2014 18:39     2D Echocardiogram   Left ventricle: The cavity size was normal. Wall thickness was normal. Systolic function was normal. The estimated ejection fraction was in the range of 50% to 55%. Regional wall motion abnormalities: Moderate hypokinesis of the mid-distalanteroseptal myocardium. Early diastolic septal annular tissue    Carotid Doppler  See 04/04/14 report above  CXR    EKG  Normal sinus rhythm Normal ECG   Therapy Recommendations pending Physical Exam  Pleasant elderly Caucasian lady not in distress.Awake alert. Afebrile. Head is nontraumatic. Neck is supple without bruit. Hearing is normal. Cardiac exam no murmur or gallop. Lungs are clear to auscultation. Distal pulses are well felt. Neurological Exam ;  Awake  Alert oriented x 3. Normal speech and language.eye movements full without nystagmus.fundi were not visualized. Vision acuity and fields appear normal. Hearing is normal. Palatal movements are normal. Face symmetric. Tongue midline. Normal strength, tone, reflexes and coordination. Normal sensation. Gait deferred. ASSESSMENT Ms. [name]  is a 73 y.o. female presenting with unexplained fall with ? Loss of consciousness    Imaging confirms a small left pareital embolic infarct likely clinically silent and unrelated to her presentation . Infarct. Infarct felt to be  embolic secondary to atrial fibrillation.  On xarelto prior to admission. Now on xarelto for secondary stroke prevention. Patient with resultant no deficits. Stroke work up underway.   Atrial Fibrillation  LDL 45 mg%  Right subclavian stenosis    Hospital day # 2  TREATMENT/PLAN  Continue   xarelto for secondary stroke prevention. Patient counseled to take the medication with a proper meal and not on an empty stomach to decrease the hepatitic first pass effect  Check CT angiogram of the neck to evaluate a right subclavian steal further. May need vascular surgery consult if significant stenosis Antony Contras, MD  04/11/2014 5:11 PM       To contact Stroke Continuity provider, please refer to [email or web address] After hours, contact General Neurology

## 2014-04-11 NOTE — Progress Notes (Signed)
Echocardiogram 2D Echocardiogram has been performed.  Alyson Locket Ansen Sayegh 04/11/2014, 12:29 PM

## 2014-04-12 ENCOUNTER — Inpatient Hospital Stay (HOSPITAL_COMMUNITY): Payer: Medicare Other

## 2014-04-12 ENCOUNTER — Other Ambulatory Visit: Payer: Self-pay | Admitting: Physician Assistant

## 2014-04-12 ENCOUNTER — Other Ambulatory Visit: Payer: Self-pay | Admitting: *Deleted

## 2014-04-12 ENCOUNTER — Ambulatory Visit (HOSPITAL_COMMUNITY): Payer: Medicare Other

## 2014-04-12 DIAGNOSIS — D62 Acute posthemorrhagic anemia: Secondary | ICD-10-CM

## 2014-04-12 DIAGNOSIS — I4892 Unspecified atrial flutter: Secondary | ICD-10-CM

## 2014-04-12 DIAGNOSIS — D649 Anemia, unspecified: Secondary | ICD-10-CM

## 2014-04-12 DIAGNOSIS — I6529 Occlusion and stenosis of unspecified carotid artery: Secondary | ICD-10-CM

## 2014-04-12 DIAGNOSIS — E119 Type 2 diabetes mellitus without complications: Secondary | ICD-10-CM

## 2014-04-12 DIAGNOSIS — I739 Peripheral vascular disease, unspecified: Secondary | ICD-10-CM

## 2014-04-12 LAB — CBC WITH DIFFERENTIAL/PLATELET
BASOS ABS: 0 10*3/uL (ref 0.0–0.1)
Basophils Relative: 0 % (ref 0–1)
EOS ABS: 0.3 10*3/uL (ref 0.0–0.7)
Eosinophils Relative: 5 % (ref 0–5)
HCT: 27.6 % — ABNORMAL LOW (ref 36.0–46.0)
HEMOGLOBIN: 8.8 g/dL — AB (ref 12.0–15.0)
Lymphocytes Relative: 28 % (ref 12–46)
Lymphs Abs: 1.5 10*3/uL (ref 0.7–4.0)
MCH: 27.5 pg (ref 26.0–34.0)
MCHC: 31.9 g/dL (ref 30.0–36.0)
MCV: 86.3 fL (ref 78.0–100.0)
MONOS PCT: 9 % (ref 3–12)
Monocytes Absolute: 0.5 10*3/uL (ref 0.1–1.0)
Neutro Abs: 3.1 10*3/uL (ref 1.7–7.7)
Neutrophils Relative %: 58 % (ref 43–77)
Platelets: 161 10*3/uL (ref 150–400)
RBC: 3.2 MIL/uL — ABNORMAL LOW (ref 3.87–5.11)
RDW: 16.1 % — ABNORMAL HIGH (ref 11.5–15.5)
WBC: 5.3 10*3/uL (ref 4.0–10.5)

## 2014-04-12 LAB — COMPREHENSIVE METABOLIC PANEL
ALT: 11 U/L (ref 0–35)
AST: 16 U/L (ref 0–37)
Albumin: 3 g/dL — ABNORMAL LOW (ref 3.5–5.2)
Alkaline Phosphatase: 61 U/L (ref 39–117)
BILIRUBIN TOTAL: 0.4 mg/dL (ref 0.3–1.2)
BUN: 19 mg/dL (ref 6–23)
CHLORIDE: 107 meq/L (ref 96–112)
CO2: 24 meq/L (ref 19–32)
Calcium: 9.5 mg/dL (ref 8.4–10.5)
Creatinine, Ser: 1.04 mg/dL (ref 0.50–1.10)
GFR calc Af Amer: 61 mL/min — ABNORMAL LOW (ref >90)
GFR calc non Af Amer: 52 mL/min — ABNORMAL LOW (ref >90)
GLUCOSE: 100 mg/dL — AB (ref 70–99)
POTASSIUM: 4.3 meq/L (ref 3.7–5.3)
SODIUM: 144 meq/L (ref 137–147)
Total Protein: 5.8 g/dL — ABNORMAL LOW (ref 6.0–8.3)

## 2014-04-12 LAB — GLUCOSE, CAPILLARY
GLUCOSE-CAPILLARY: 105 mg/dL — AB (ref 70–99)
Glucose-Capillary: 99 mg/dL (ref 70–99)
Glucose-Capillary: 96 mg/dL (ref 70–99)
Glucose-Capillary: 105 mg/dL — ABNORMAL HIGH (ref 70–99)

## 2014-04-12 MED ORDER — LEVALBUTEROL TARTRATE 45 MCG/ACT IN AERO: 2.0000 | INHALATION_SPRAY | Freq: Four times a day (QID) | RESPIRATORY_TRACT | Status: DC | PRN

## 2014-04-12 MED ORDER — LEVALBUTEROL HCL 0.63 MG/3ML IN NEBU
0.6300 mg | INHALATION_SOLUTION | Freq: Four times a day (QID) | RESPIRATORY_TRACT | Status: DC | PRN
  Administered 2014-04-12: 0.63 mg via RESPIRATORY_TRACT
  Filled 2014-04-12: qty 3

## 2014-04-12 MED ORDER — TIOTROPIUM BROMIDE MONOHYDRATE 18 MCG IN CAPS
18.0000 ug | ORAL_CAPSULE | Freq: Every day | RESPIRATORY_TRACT | Status: DC
  Administered 2014-04-13: 18 ug via RESPIRATORY_TRACT
  Filled 2014-04-12: qty 5

## 2014-04-12 MED ORDER — GLIMEPIRIDE 1 MG PO TABS
1.0000 mg | ORAL_TABLET | Freq: Every day | ORAL | Status: DC
  Administered 2014-04-12 – 2014-04-13 (×2): 1 mg via ORAL
  Filled 2014-04-12 (×3): qty 1

## 2014-04-12 MED ORDER — IOHEXOL 350 MG/ML SOLN
50.0000 mL | Freq: Once | INTRAVENOUS | Status: AC | PRN
  Administered 2014-04-12: 50 mL via INTRAVENOUS

## 2014-04-12 NOTE — Progress Notes (Signed)
Stroke Team Progress Note  HISTORY [name] is a 73 y.o. female with history of atrial flutter on Xarelto, coronary artery disease with CABG, hypertension and hyperlipidemia, admitted on 04/09/2014 following an episode of near-syncope. Patient recalls bending over to pick up a newspaper and continued to the ground. It's unclear how long she was unable to get back to her feet. The episode occurred around 11:15 AM on 04/09/2014. The patient has no previous history of stroke nor TIA. CT scan of her head 04/09/2014 showed no signs of acute intracranial abnormality. 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 NIH stroke score at the time of Dr. Les Pou evaluation was 0. Patient had no focal deficits on admission.   LSN: 11:15 AM on 04/09/2014  tPA Given: No: No clear deficits and beyond time window for treatment consideration  MRankin: 0   SUBJECTIVE The patient's daughter was at the bedside. The patient denies any further presyncopal or syncopal episodes. 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   OBJECTIVE Most recent Vital Signs: Filed Vitals:   04/11/14 1200 04/11/14 1617 04/11/14 2100 04/12/14 0420  BP: 146/52 140/44 157/64 137/61  Pulse: 73 72  78  Temp: 98 F (36.7 C) 97.5 F (36.4 C)  97.8 F (36.6 C)  TempSrc: Oral Oral  Oral  Resp: 18 18  18   Height:      Weight:    157 lb 14.4 oz (71.623 kg)  SpO2: 89% 93%  90%   CBG (last 3)   Recent Labs  04/11/14 1618 04/11/14 2030 04/12/14 0739  GLUCAP 97 114* 105*    IV Fluid Intake:      MEDICATIONS  . amiodarone  400 mg Oral Daily  . atorvastatin  40 mg Oral QPM  . cilostazol  100 mg Oral BID  . gabapentin  600 mg Oral TID  . insulin aspart  0-5 Units Subcutaneous QHS  . insulin aspart  0-9 Units Subcutaneous TID WC  . metoprolol tartrate  25 mg Oral BID  . rivaroxaban  20 mg Oral Q supper  . sertraline  50 mg Oral Daily  . sodium chloride  3 mL  Intravenous Q12H   PRN:  HYDROcodone-acetaminophen, LORazepam, promethazine  Diet:  Carb Control   Activity:  Bedrest  DVT Prophylaxis:  xarelto  CLINICALLY SIGNIFICANT STUDIES Basic Metabolic Panel:   Recent Labs Lab 04/11/14 0408 04/12/14 0510  NA 144 144  K 4.5 4.3  CL 106 107  CO2 25 24  GLUCOSE 111* 100*  BUN 18 19  CREATININE 0.92 1.04  CALCIUM 9.9 9.5   Liver Function Tests:   Recent Labs Lab 04/11/14 0408 04/12/14 0510  AST 16 16  ALT 12 11  ALKPHOS 64 61  BILITOT 0.3 0.4  PROT 6.1 5.8*  ALBUMIN 3.1* 3.0*   CBC:   Recent Labs Lab 04/11/14 0408 04/12/14 0510  WBC 6.4 5.3  NEUTROABS 3.7 3.1  HGB 9.6* 8.8*  HCT 30.3* 27.6*  MCV 85.8 86.3  PLT 190 161   Coagulation: No results found for this basename: LABPROT, INR,  in the last 168 hours Cardiac Enzymes:   Recent Labs Lab 04/09/14 2101 04/10/14 0128 04/10/14 0707  TROPONINI <0.30 <0.30 <0.30   Urinalysis: No results found for this basename: COLORURINE, APPERANCEUR, LABSPEC, PHURINE, GLUCOSEU, HGBUR, BILIRUBINUR, KETONESUR, PROTEINUR, UROBILINOGEN, NITRITE, LEUKOCYTESUR,  in the last 168 hours Lipid Panel    Component Value Date/Time   CHOL 118 04/11/2014 0408  TRIG 137 04/11/2014 0408   HDL 46 04/11/2014 0408   CHOLHDL 2.6 04/11/2014 0408   VLDL 27 04/11/2014 0408   LDLCALC 45 04/11/2014 0408   HgbA1C  Lab Results  Component Value Date   HGBA1C 6.6* 04/10/2014    Urine Drug Screen:   No results found for this basename: labopia,  cocainscrnur,  labbenz,  amphetmu,  thcu,  labbarb    Alcohol Level: No results found for this basename: ETH,  in the last 168 hours  Mr Brain Wo Contrast 04/10/2014    1. 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      2D Echocardiogram   Left ventricle: The cavity size was normal. Wall thickness was normal. Systolic function was normal. The estimated ejection fraction was in the range of 50% to 55%. Regional wall motion  abnormalities: Moderate hypokinesis of the mid-distalanteroseptal myocardium. Early diastolic septal annular tissue   CT Angiogram of the neck with and without contrast 04/12/2014  High-grade complex plaque involving the proximal innominate artery with extensive calcification in irregularity, estimated at least 90% stenosis.  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  Estimated 75-80% calcific stenosis left internal carotid artery origin.  Non stenotic atheromatous change right carotid bifurcation.  Right vertebral dominant without significant proximal flow reducing lesion. Left vertebral patent.    Carotid Doppler  See 04/04/14 report above  CXR    EKG  Normal sinus rhythm Normal ECG   Therapy Recommendations no followup therapy was recommended.   Physical Exam  Pleasant elderly Caucasian lady not in distress.Awake alert. Afebrile. Head is nontraumatic. Neck is supple without bruit. Hearing is normal. Cardiac exam no murmur or gallop. Lungs are clear to auscultation. Distal pulses are well felt. Neurological Exam ;  Awake  Alert oriented x 3. Normal speech and language.eye movements full without nystagmus.fundi were not visualized. Vision acuity and fields appear normal. Hearing is normal. Palatal movements are normal. Face symmetric. Tongue midline. Normal strength, tone, reflexes and coordination. Normal sensation. Gait deferred.   ASSESSMENT Ms. [name] is a 73 y.o. female presenting with unexplained fall with ? loss of consciousness. Imaging confirmed a small left pareital embolic infarct likely clinically silent and unrelated to her presentation as well as an acute nonhemorrhagic infarct of the right parietal lobe which measures 10 mm.  Infarcts felt to be  embolic secondary to atrial fibrillation. On xarelto prior to admission. Now on xarelto for secondary stroke prevention. Patient with resultant no deficits. Stroke work up  underway.   Atrial Fibrillation  Cholesterol 118; LDL 45 mg%  Hemoglobin A1c 6.6  CT angiogram -  High-grade proximal innominate artery estimated at least 90% stenosis.    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    Estimated 75-80% calcific stenosis left internal carotid artery origin.   Hospital day # 3  TREATMENT/PLAN  Continue   xarelto for secondary stroke prevention. Patient counseled to take the medication with a proper meal and not on an empty stomach to decrease the hepatitic first pass effect  Vascular surgery consult  Stroke team will sign off. Please call if we can be of further assistance.  Followup Dr. Leonie Man in 2 months.  Antony Contras, MD       To contact Stroke Continuity provider, please refer to [email or web address] After hours, contact General Neurology

## 2014-04-12 NOTE — Progress Notes (Addendum)
Inpatient Diabetes Program Recommendations  AACE/ADA: New Consensus Statement on Inpatient Glycemic Control (2013)  Target Ranges:  Prepandial:   less than 140 mg/dL      Peak postprandial:   less than 180 mg/dL (1-2 hours)      Critically ill patients:  140 - 180 mg/dL     Results for KAPRI, NERO (MRN [id number]) as of 04/12/2014 11:14  Ref. Range 04/11/2014 16:18 04/11/2014 20:30 04/12/2014 07:39  Glucose-Capillary Latest Range: 70-99 mg/dL 97 114 (H) 105 (H)    Results for CECLIA, KOKER (MRN [id number]) as of 04/12/2014 11:14  Ref. Range 04/09/2014 21:01 04/10/2014 01:29  Hemoglobin A1C Latest Range: <5.7 % 6.9 (H) 6.6 (H)     Patient admitted with Presyncope.  History of CAD, CABG, COPD, CHF.  New diagnosis of DM made this admission by the MD.   Spoke with pt and her daughter about new diagnosis.  Discussed A1C results with her and explained what an A1C is, basic pathophysiology of DM Type 2, basic home care, importance of checking CBGs and maintaining good CBG control to prevent long-term and short-term complications.  Reviewed signs and symptoms of hyperglycemia and hypoglycemia.  RNs to provide ongoing basic DM education at bedside with this patient.  Have ordered educational booklet and DM videos.  RD consult placed by RN last evening for DM diet education.  Will place Outpatient DM education referral to the [facility] DM management center for patient to receive follow up DM education after d/c.   MD- Patient fairly well controlled without medications prior to admission.  Not sure if you will send her home on Oral DM medication?  If creatinine WNL, could send home on low dose Metformin 500 mg daily or bid.   MD- Please also make sure to give patient a Rx for the following:  1. Blood glucose meter kit [Code # 23536] 2. Blood glucose meter strips [Code # 14431] 3. Lancets miscellaneous [Code # 5400]   Will follow. Wyn Quaker RN, MSN,  CDE Diabetes Coordinator Inpatient Diabetes Program Team Pager: [phone] (8a-10p)

## 2014-04-12 NOTE — Consult Note (Signed)
Vascular Surgery Consultation  Reason for Consult: Possible subclavian steal syndrome right side-recent episode of right visual abnormality and falling spell  HPI: [name] is a 73 y.o. 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 This patient has a complex medical history. She underwent coronary artery bypass grafting in 2001. She also underwent left carotid to subclavian bypass grafting by Dr. Drucie Opitz shortly after that. She has had previous aortobifemoral bypass grafting and bilateral femoral-popliteal bypass grafting all done but Dr. Amedeo Plenty in the past. She has no history of CVA. 3 days ago she had an episode where she was bending over to pick something up and had a falling spell. She's not certain why this happened. There was no syncope or blurred vision or dizziness associated with this. She has had a history on 2 occasions of some abnormal vision in the right eye lasting a few minutes and then resolving. She has had no right arm claudication symptoms or ischemic symptoms in the right arm. She will occasionally get some tingling in the right hand when writing. 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   Past Medical History  Diagnosis Date  . ANEMIA 08/28/2010  . CAD (coronary artery disease)     a. s/p CABGx4 in 2001.  Marland Kitchen COLONIC POLYPS, HX OF 06/15/2007  . COPD (chronic obstructive pulmonary disease)   . Depressive disorder   . GERD 12/19/2009  . Hyperlipidemia   . Hypertension   . HYPOTENSION 08/11/2010  . Osteoarthritis   . PVD (peripheral vascular disease)     a. Duplex 03/2013: stable moderate carotid dz - 10-93% RICA, 23-55% LICA, L subclavian artery to CCA stent widely patent.  b. Prior R fempop bypass graft, R CIA stent.  . Lumbar stenosis   . Lung abscess 2011  . CHF (congestive heart failure)   . Hx of colonoscopy   . Paroxysmal atrial flutter     a. Dx 01/2014, placed  on Xarelto.  . Aortic stenosis     a. Mild by echo 01/2013.   Past Surgical History  Procedure Laterality Date  . L subclavian bypass  07/2004  . Tubal ligation    . Coronary artery bypass graft  05/2000  . Femoral popliteal bypass-r  04/1999  . Right common iliac pta with stent placement  07/2000  . Right leg blockage  2003  . Aorta bifemoral bypass graft  2004  . Left cea  2005  . Lung cyst biopsy  2011   History   Social History  . Marital Status: Widowed    Spouse Name: N/A    Number of Children: 2  . Years of Education: N/A   Occupational History  .     Social History Main Topics  . Smoking status: Former Smoker -- 1.50 packs/day for 40 years    Types: Cigarettes    Quit date: 06/29/2000  . Smokeless tobacco: Never Used  . Alcohol Use: No  . Drug Use: No  . Sexual Activity: None   Other Topics Concern  . None   Social History Narrative  . None   Family History  Problem Relation Age of Onset  . Diabetes Father   . Heart attack Mother   . Diabetes    . Coronary artery disease    . Colon cancer Neg Hx    Allergies  Allergen Reactions  . Codeine Phosphate     REACTION: unspecified  Prior to Admission medications   Medication Sig Start Date End Date Taking? Authorizing Provider  aspirin 81 MG tablet Take 81 mg by mouth daily as needed for pain.   Yes Historical Provider, MD  atorvastatin (LIPITOR) 40 MG tablet Take 1 tablet (40 mg total) by mouth every evening. 02/21/14  Yes Dayna N Dunn, PA-C  cilostazol (PLETAL) 100 MG tablet Take 100 mg by mouth 2 (two) times daily.   Yes Historical Provider, MD  diltiazem (CARDIZEM CD) 120 MG 24 hr capsule Take 1 capsule (120 mg total) by mouth daily.   Yes Dorothy Spark, MD  furosemide (LASIX) 40 MG tablet Take 1 tablet (40 mg total) by mouth daily. 03/25/14  Yes Dorothy Spark, MD  gabapentin (NEURONTIN) 600 MG tablet Take 1 tablet (600 mg total) by mouth 3 (three) times daily. 07/23/13  Yes Marletta Lor, MD   hydrochlorothiazide (HYDRODIURIL) 25 MG tablet Take 25 mg by mouth daily.  09/03/13  Yes Historical Provider, MD  HYDROcodone-acetaminophen (NORCO/VICODIN) 5-325 MG per tablet Take 0.5 tablets by mouth every 6 (six) hours as needed for moderate pain. 03/25/14  Yes Marletta Lor, MD  LORazepam (ATIVAN) 0.5 MG tablet Take 0.5 mg by mouth 2 (two) times daily as needed for anxiety.   Yes Historical Provider, MD  losartan (COZAAR) 25 MG tablet Take 25 mg by mouth daily.   Yes Historical Provider, MD  meloxicam (MOBIC) 7.5 MG tablet Take 7.5 mg by mouth daily.   Yes Historical Provider, MD  metoprolol (LOPRESSOR) 50 MG tablet Take 1 tablet (50 mg total) by mouth 2 (two) times daily. 03/25/14  Yes Dorothy Spark, MD  Multiple Vitamins-Minerals (CENTRUM SILVER ULTRA WOMENS PO) Take by mouth.   Yes Historical Provider, MD  potassium chloride (K-DUR) 10 MEQ tablet Take 1 tablet (10 mEq total) by mouth daily. 03/12/14  Yes Peter M Martinique, MD  promethazine (PHENERGAN) 25 MG tablet Take 1 tablet (25 mg total) by mouth every 6 (six) hours as needed for nausea or vomiting. 03/25/14  Yes Marletta Lor, MD  rivaroxaban (XARELTO) 20 MG TABS tablet Take 1 tablet (20 mg total) by mouth daily with supper. 03/21/14  Yes Dorothy Spark, MD  sertraline (ZOLOFT) 50 MG tablet Take 50 mg by mouth daily.    Yes Historical Provider, MD     Positive ROS: Denies active chest pain, dyspnea on exertion, PND, orthopnea, hemoptysis, claudication.   All other systems have been reviewed and were otherwise negative with the exception of those mentioned in the HPI and as above.  Physical Exam: Filed Vitals:   04/12/14 1001  BP: 180/70  Pulse: 85  Temp:   Resp:     General: Alert, no acute distress HEENT: Normal for age Cardiovascular: Regular rate and rhythm. Carotid pulse 2+ right 3+ on the left.-Harsh bruit right supraclavicular area. Respiratory: Clear to auscultation. No cyanosis, no use of accessory  musculature GI: No organomegaly, abdomen is soft and non-tender Skin: No lesions in the area of chief complaint Neurologic: Sensation intact distally Psychiatric: Patient is competent for consent with normal mood and affect Musculoskeletal: No obvious deformities Extremities: 3+ brachial 2+ radial pulse palpable on the left 3+ brachial 1-2+ radial pulse palpable on the right Bilateral femoral pulses and 3+ and dorsalis pedis pulses and 3+.    Imaging reviewed: CT angiogram of the neck was performed today which I reviewed a computer. Patient has a high-grade stenosis of the innominate artery at its origin  proximal and 90% in severity. 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 Recent falling spell etiology unknown. #275-80% left ICA stenosis-likely asymptomatic although patient does have evidence of old CVA on MRI scan #3 aortobifemoral bypass grafting and bilateral femoral-popliteal bypass grafting all patent #4 history of left carotid subclavian bypass widely patent #5 coronary artery disease status post coronary artery bypass grafting 2001 by Dr. Lucy Chris awaiting CT angiogram of heart because apparently cardiac cath was unable to be performed through femoral for radial approach  Do not think symptoms are convincing enough to present time to plan any intervention, severe innominate stenosis which would be difficult. This is either require open repair through a redo sternal incision versus a left carotid to right carotid bypass. Doubt stenting of this heavily calcified ostial stenosis of the innominate artery would be an option I will see patient in 6 months in the office with a carotid duplex exam in my office at that time. If she develops any left brain symptoms or further loss of vision and right she will be in touch with [place].  Discussed this with patient and daughter and they agree with this plan. No recommendation for intervention at this time   Tinnie Gens, MD 04/12/2014 12:25 PM

## 2014-04-12 NOTE — Progress Notes (Addendum)
Patient: [name] / Admit Date: 04/09/2014 / Date of Encounter: 04/12/2014, 7:57 AM  Subjective  Denies complaints. Bedside hemoccult by me was negative but unfortunately patient had almost no stool in rectal vault.  Objective   Telemetry: NSR occ PVC  Physical Exam: Blood pressure 137/61, pulse 78, temperature 97.8 F (36.6 C), temperature source Oral, resp. rate 18, height 5' 2.5" (1.588 m), weight 157 lb 14.4 oz (71.623 kg), SpO2 90.00%. General: Well developed, well nourished WF in no acute distress.  Head: Normocephalic, atraumatic, sclera non-icteric, no xanthomas, nares are without discharge.  Neck: Bilateral carotid bruits. JVP not elevated. Bilateral subclavian bruits. Lungs: Clear bilaterally to auscultation without wheezes, rales, or rhonchi. Breathing is unlabored.  Heart: RRR S1 S2, 2/6 SEM without rubs or gallops.  Abdomen: Soft, non-tender, non-distended with normoactive bowel sounds. No rebound/guarding.  Extremities: No clubbing or cyanosis. No edema. Bilateral femoral bruits. Neuro: Alert and oriented X 3. Moves all extremities spontaneously.  Psych: Responds to questions appropriately with a normal affect.    Intake/Output Summary (Last 24 hours) at 04/12/14 0757 Last data filed at 04/11/14 1700  Gross per 24 hour  Intake   1705 ml  Output      0 ml  Net   1705 ml    Inpatient Medications:  . amiodarone  400 mg Oral Daily  . atorvastatin  40 mg Oral QPM  . cilostazol  100 mg Oral BID  . gabapentin  600 mg Oral TID  . insulin aspart  0-5 Units Subcutaneous QHS  . insulin aspart  0-9 Units Subcutaneous TID WC  . metoprolol tartrate  25 mg Oral BID  . rivaroxaban  20 mg Oral Q supper  . sertraline  50 mg Oral Daily  . sodium chloride  3 mL Intravenous Q12H   Infusions:    Labs:  Recent Labs  04/11/14 0408 04/12/14 0510  NA 144 144  K 4.5 4.3  CL 106 107  CO2 25 24  GLUCOSE 111* 100*  BUN 18 19  CREATININE 0.92 1.04  CALCIUM 9.9 9.5     Recent Labs  04/11/14 0408 04/12/14 0510  AST 16 16  ALT 12 11  ALKPHOS 64 61  BILITOT 0.3 0.4  PROT 6.1 5.8*  ALBUMIN 3.1* 3.0*    Recent Labs  04/11/14 0408 04/12/14 0510  WBC 6.4 5.3  NEUTROABS 3.7 3.1  HGB 9.6* 8.8*  HCT 30.3* 27.6*  MCV 85.8 86.3  PLT 190 161    Recent Labs  04/09/14 1644 04/09/14 2101 04/10/14 0128 04/10/14 0707  TROPONINI <0.30 <0.30 <0.30 <0.30    Recent Labs  04/10/14 0129  HGBA1C 6.6*     Radiology/Studies:  Dg Chest 2 View  04/09/2014   CLINICAL DATA:  Loss of consciousness  EXAM: CHEST  2 VIEW  COMPARISON:  03/08/2014  FINDINGS: Lungs are essentially clear. No focal consolidation. No pleural effusion or pneumothorax.  The heart is normal in size. Postsurgical changes related to prior CABG.  Mild degenerative changes of the visualized thoracolumbar spine.  IMPRESSION: No evidence of acute cardiopulmonary disease.   Electronically Signed   By: Julian Hy M.D.   On: 04/09/2014 18:42   Ct Head Wo Contrast  04/09/2014   CLINICAL DATA:  Loss of consciousness  EXAM: CT HEAD WITHOUT CONTRAST  TECHNIQUE: Contiguous axial images were obtained from the base of the skull through the vertex without intravenous contrast.  COMPARISON:  None.  FINDINGS: There is calcification of the carotid  arteries. There is vertebrobasilar calcification as well. There is moderate diffuse atrophy. There is mild low attenuation in the deep white matter consistent with chronic involutional change. There is no evidence of vascular territory infarct. There is a prominent perivascular space in the medial left temporal lobe. This is an incidental finding. There is no evidence of parenchymal hemorrhage or extra-axial fluid. The calvarium is intact with no significant inflammatory change in the visualized sinuses.  IMPRESSION: Age-related atrophy and involutional change with no acute findings.   Electronically Signed   By: Skipper Cliche M.D.   On: 04/09/2014 17:55    Mr Brain Wo Contrast  04/10/2014   CLINICAL DATA:  Presyncope.  Headache.  The examination had to be discontinued prior to completion due to nausea and anxiety.  EXAM: MRI HEAD WITHOUT CONTRAST  TECHNIQUE: Multiplanar, multiecho pulse sequences of the brain and surrounding structures were obtained without intravenous contrast.  COMPARISON:  CT head without contrast 04/09/2014.  FINDINGS: An acute nonhemorrhagic when infarct is present in the right parietal lobe measuring 10 mm. Focal T2 hyperintensity is associated.  Mild generalized atrophy is present. No hemorrhage or mass lesion is evident. The ventricles are of normal size. No significant extra-axial fluid collection is present.  Flow is present in the major intracranial arteries. The patient is status post bilateral lens extractions. The paranasal sinuses and mastoid air cells are clear.  IMPRESSION: 1. 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   Electronically Signed   By: Lawrence Santiago M.D.   On: 04/10/2014 18:39     Assessment and Plan  1. Pre-syncope/fall without dizziness or palpitations before fall, possible slurred speech - do not believe fall was acutely cardiac related, however, MRI yesterday PM demonstrating acute nonhemorrhagic infarct.   2. Acute nonhemorrhagic parietal CVA - occurring in the setting of Xarelto use. Neuro OK with continuing Xarelto per their note. See below re: PAD.   3. CAD s/p CABG 2001 - Recent abnormal stress test and cath with 2 patent bypasses but unable to visualize LIMA graft given prior left carotid to subclavian bypass. Plan was made for cardiac CT however 5/13 patient's BP was low and she was unable to get the lopressor/NTG for her coronary CT, thus procedure was held. BP has improved.  4. SVT/Paroxysmal atrial flutter/PAF - meds downtitrated due to hypotension/CVA. Amiodarone added given inability to titrate rate controlling agents due to hypotension, and possible further  hypotension as a result of rapid AF. Baseline TSH and LFTs ok. 2D echo EF 50-55%, mod HK of mid-distal antseptal myocardium, grade 2 d/d, no aortic stenosis, LA size 66mm.  5. Hypotension - tx with IVF. Lasix, HCTZ, ARB held.  I would keep meds the same for now  BP is better.  Follow    6.  AV  I have reviewed echo  There is mild AS.   7. PAD (LE bypass, left carotid to subclavian bypass, CAD) - 02-58% LICA, 5-27% RICA, Progression of innominate artery stenosis, with parvus tardus flow noted throughout the right carotid system and arm. Dr. Francesca Oman nurse is helping arrange vascular followup. Vascular asked to see yesterday, will make sure this consult is called again.   8. Newly recognized diabetes mellitus A1C 6.6-6.9 - consider initiation of rx given stroke and CAD.   9. Anemia ?iron deficiency - Hgb continues to fall. Bedside hemoccult by me was negative but almost no stool in rectal vault. She reports darker stools a few weeks ago. Iron  level ? at 29, ? TIBC, ? % sat - further w/u per IM. Note she is on both Xarelto and Pletal, may need to consider d/c Pletal (scheduled aspirin was dc'd in March but apparently the patient takes occasional tablet PRN pain). Meloxicam DC'd.  10. Hypercalcemia - improved with hydration. PTH low but total calcium high. Significance per IM.    Signed,  Melina Copa PA-C   Patient seen and examined  She was ambulating in hall with daughter at good pace  Only complained of mild SOB  Asked for an inhaler (Albuterol/spiriva) that she uses at home.   Overall, she looked pretty good  I would keep on same meds  I will discuss with Liane Comber  I am not eager to rush into cardiac CT>    Will Rx inhalers that patient is asking for.    Fay Records

## 2014-04-12 NOTE — Plan of Care (Signed)
Problem: Food- and Nutrition-Related Knowledge Deficit (NB-1.1) Goal: Nutrition education Formal process to instruct or train a patient/client in a skill or to impart knowledge to help patients/clients voluntarily manage or modify food choices and eating behavior to maintain or improve health. Outcome: Completed/Met Date Met:  04/12/14  RD consulted for nutrition education regarding diabetes.   Patient with newly diagnosed diabetes. She reports a family history, but lacks knowledge of dietary restrictions. She states that she typically eats small meals and snacks throughout the day.     Lab Results  Component Value Date    HGBA1C 6.6* 04/10/2014    RD provided "Carbohydrate Counting for People with Diabetes" handout from the Academy of [employer]etics. Discussed different food groups and their effects on blood sugar, emphasizing carbohydrate-containing foods. Provided list of carbohydrates and recommended serving sizes of common foods.  Discussed importance of controlled and consistent carbohydrate intake throughout the day. Provided examples of ways to balance meals/snacks and encouraged intake of high-fiber, whole grain complex carbohydrates. Teach back method used.  Noted: outpatient DM education referral to be made. Unclear if patient will be sent home on medications or will try to manage with diet alone.   Expect good compliance.  Body mass index is 28.4 kg/(m^2). Pt meets criteria for overweight based on current BMI.  Current diet order is Carbohydrate modified, patient is consuming approximately 100% of meals at this time. Labs and medications reviewed. No further nutrition interventions warranted at this time. RD contact information provided. If additional nutrition issues arise, please re-consult RD.  Larey Seat, RD, LDN Pager #: [phone] After-Hours Pager #: [phone]

## 2014-04-12 NOTE — Progress Notes (Signed)
D/w Dr. Harrington Challenger - pt is ok for discharge from our standpoint. Do not plan for inpatient cardiac CT at this time - will hold off given recent stroke and anemia and reassess timing as outpatient. Patient ambulated with her daughter briskly in the hallway without chest pain. Continue current medications. Dr. Harrington Challenger had mentioned to the patient that she may benefit from albuterol and spiriva and I will defer to IM for this. Will defer further workup of anemia to internal medicine as well. Note hemoccult was negative today by me thus wondering if there is a component of dilution given administration of IVF. She did endorse dark stools a few weeks ago but not black. No further incidence of this. She denies any other bleeding. We will check CBC in our office on Monday the 18th (unless further inpatient workup is warranted). I have left a message on our office's scheduling voicemail requesting this, plus a follow-up appointment, and our office will call the patient with this appointment. The patient does not need to keep appt 5/28 (was scheduled for "new EP consult) with Dr. Lovena Le --> she will f/u with Dr. Meda Coffee. Dr. Inis Sizer will be observing patient's blood sugars on new Amaryl so not yet decided if she will go home tonight or tomorrow.  Khamryn Calderone PA-C

## 2014-04-12 NOTE — Progress Notes (Signed)
[facility] PROGRESS NOTE  IRIE DOWSON NAT:[id number] DOB: 01-15-1941 DOA: 04/09/2014 PCP: Nyoka Cowden, MD  Assessment/Plan: Acute infarct, right parietal lobe -Per MRI 5/13 -Continue  Xarelto per neurology (does not need to be on aspirin in addition per Dr. Leonie Man) -await echo, FLP,A1C. she just had carotid Dopplers done on May 7>> per cardiology showed subclavian stenosis>> vascular surgery consulted per cardiology and will eval -Consult neuro for further recommendations -Consult PT OT ST Diabetes mellitus-new A1c elevated 6.9, continue modify carb diet -She has had some CBGs  in the 90s,and I discussed starting on low-dose oral hypoglycemic to monitor overnight>> if she tolerates this without hypoglycemia will DC her on it in a.m. but if hypoglycemia will DC on no meds for outpatient followup. -Discussed with patient and family and the agreeable>> will followup in a.m.  Subclavian stenosis -CT angiogram of neck ordered per neuro -Appreciate vascular input>> no surgical intervention recommended at this time patient to follow up outpatient >> follow up on results CAD/CHF/atrial flutter -Cardiac enzymes negative -Per cardiology would not do coronary CT today due to borderline BP (would need to use NTG which would lower BP further)>> per cards coronary CT would not be done this admission she is to follow up outpatient with Dr. Meda Coffee Anemia -Hemoglobin is 9.5 with no gross evidence of bleeding  AKI -Improved with hydration Peripheral vascular disease -Continue Pletal  Hypercalcemia -Resolved with hydration Hypokalemia  - resolved, replace Code Status: Full Family Communication: None are at the bedside  Disposition Plan: To home when medically ready   Consultants:  Cardiology  Procedures:  None  Antibiotics:  None   HPI/Subjective:  she denies any complaints. Objective: Filed Vitals:   04/12/14 1500  BP: 157/60  Pulse: 84  Temp: 98 F (36.7  C)  Resp: 16    Intake/Output Summary (Last 24 hours) at 04/12/14 1636 Last data filed at 04/12/14 1100  Gross per 24 hour  Intake    240 ml  Output   1000 ml  Net   -760 ml   Filed Weights   04/10/14 0515 04/11/14 0400 04/12/14 0420  Weight: 68.675 kg (151 lb 6.4 oz) 69.219 kg (152 lb 9.6 oz) 71.623 kg (157 lb 14.4 oz)    Exam:  General: alert & oriented x 3 In NAD Cardiovascular: RRR, nl S1 s2 Respiratory: CTAB Abdomen: soft +BS NT/ND, no masses palpable Extremities: No cyanosis and no edema  Neuro: Alert and oriented x3 creatinine is 2-12 grossly intact strength is normal and symmetric. Sensory grossly intact    Data Reviewed: Basic Metabolic Panel:  Recent Labs Lab 04/09/14 1644 04/09/14 2101 04/10/14 0129 04/11/14 0408 04/12/14 0510  NA 139  --  142 144 144  K 3.5*  --  3.0* 4.5 4.3  CL 96  --  99 106 107  CO2 26  --  28 25 24   GLUCOSE 98  --  104* 111* 100*  BUN 30*  --  28* 18 19  CREATININE 1.33*  --  1.20* 0.92 1.04  CALCIUM 11.2* 10.8* 10.1 9.9 9.5   Liver Function Tests:  Recent Labs Lab 04/09/14 1644 04/10/14 0129 04/11/14 0408 04/12/14 0510  AST 21 18 16 16   ALT 16 13 12 11   ALKPHOS 83 68 64 61  BILITOT 0.2* 0.3 0.3 0.4  PROT 7.5 6.2 6.1 5.8*  ALBUMIN 3.8 3.1* 3.1* 3.0*   No results found for this basename: LIPASE, AMYLASE,  in the last 168 hours No results found for  this basename: AMMONIA,  in the last 168 hours CBC:  Recent Labs Lab 04/09/14 1644 04/10/14 0129 04/11/14 0408 04/12/14 0510  WBC 7.6 7.0 6.4 5.3  NEUTROABS 4.7 4.2 3.7 3.1  HGB 10.8* 9.5* 9.6* 8.8*  HCT 33.8* 29.9* 30.3* 27.6*  MCV 83.9 83.8 85.8 86.3  PLT 188 170 190 161   Cardiac Enzymes:  Recent Labs Lab 04/09/14 1644 04/09/14 2101 04/10/14 0128 04/10/14 0707  TROPONINI <0.30 <0.30 <0.30 <0.30   BNP (last 3 results)  Recent Labs  04/09/14 2101  PROBNP 678.0*   CBG:  Recent Labs Lab 04/09/14 1700 04/11/14 1618 04/11/14 2030 04/12/14 0739  04/12/14 1149  GLUCAP 96 97 114* 105* 99    No results found for this or any previous visit (from the past 240 hour(s)).   Studies: Ct Angio Neck W/cm &/or Wo/cm  04/12/2014   CLINICAL DATA:  Syncopal episode. Atrial fibrillation. Acute right parietal infarct. Question subclavian steal.  EXAM: CT ANGIOGRAPHY NECK  TECHNIQUE: Multidetector CT imaging of the neck was performed using the standard protocol during bolus administration of intravenous contrast. Multiplanar CT image reconstructions and MIPs were obtained to evaluate the vascular anatomy. Carotid stenosis measurements (when applicable) are obtained utilizing NASCET criteria, using the distal internal carotid diameter as the denominator.  CONTRAST:  85mL OMNIPAQUE IOHEXOL 350 MG/ML SOLN  COMPARISON:  MR head 04/10/2014.  CT head 04/09/2014  FINDINGS: Moderate atheromatous change transverse arch.  High-grade complex plaque involving the proximal innominate with extensive calcification and multiple non contiguous intraplaque channels, is estimated to be at least 90% stenosis.  Non stenotic calcification at the origins of the right common carotid and right subclavian arteries. No right vertebral ostial stenosis, but moderate eccentric plaque throughout the proximal and mid right vertebral.  Non stenotic atheromatous change at the origin of the left common carotid. Widely patent left carotid to subclavian bypass. Occluded proximal left subclavian. Left vertebral is patent but smaller than the right. Minor eccentric atherosclerotic calcifications are noted in the proximal and mid left vertebral.  At the right carotid bifurcation, there is non stenotic calcific and noncalcified plaque. Proximal/distal ratio of 2.6/4.3 yields less than a 50% stenosis.  At the left carotid bifurcation, there is significant narrowing due to heavily calcified plaque correlating with the Doppler findings. Proximal/distal ratio of 1.0/4.6 correlates with a 75-80% stenosis.  Both  vertebrals contribute to formation of the basilar, with a right dominant. No definite flow reducing distal vertebral stenosis.  No neck masses. COPD. Mild biapical scarring. No mediastinal adenopathy.  Review of the MIP images confirms the above findings.  IMPRESSION: High-grade complex plaque involving the proximal innominate artery with extensive calcification in irregularity, estimated at least 90% stenosis.  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  Estimated 75-80% calcific stenosis left internal carotid artery origin.  Non stenotic atheromatous change right carotid bifurcation.  Right vertebral dominant without significant proximal flow reducing lesion. Left vertebral patent.   Electronically Signed   By: Rolla Flatten M.D.   On: 04/12/2014 11:58   Mr Brain Wo Contrast  04/10/2014   CLINICAL DATA:  Presyncope.  Headache.  The examination had to be discontinued prior to completion due to nausea and anxiety.  EXAM: MRI HEAD WITHOUT CONTRAST  TECHNIQUE: Multiplanar, multiecho pulse sequences of the brain and surrounding structures were obtained without intravenous contrast.  COMPARISON:  CT head without contrast 04/09/2014.  FINDINGS: An acute nonhemorrhagic when infarct is present in the right parietal lobe  measuring 10 mm. Focal T2 hyperintensity is associated.  Mild generalized atrophy is present. No hemorrhage or mass lesion is evident. The ventricles are of normal size. No significant extra-axial fluid collection is present.  Flow is present in the major intracranial arteries. The patient is status post bilateral lens extractions. The paranasal sinuses and mastoid air cells are clear.  IMPRESSION: 1. 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   Electronically Signed   By: Lawrence Santiago M.D.   On: 04/10/2014 18:39    Scheduled Meds: . amiodarone  400 mg Oral Daily  . atorvastatin  40 mg Oral QPM  . cilostazol  100 mg Oral BID  .  gabapentin  600 mg Oral TID  . insulin aspart  0-5 Units Subcutaneous QHS  . insulin aspart  0-9 Units Subcutaneous TID WC  . metoprolol tartrate  25 mg Oral BID  . rivaroxaban  20 mg Oral Q supper  . sertraline  50 mg Oral Daily  . sodium chloride  3 mL Intravenous Q12H   Continuous Infusions:    Active Problems:   Pre-syncope   Syncope   CVA (cerebral infarction)    Time spent: [facility] Pager [phone]. If 7PM-7AM, please contact night-coverage at [email or web address], password [facility] 04/12/2014, 4:36 PM  LOS: 3 days

## 2014-04-13 LAB — CBC WITH DIFFERENTIAL/PLATELET
BASOS PCT: 0 % (ref 0–1)
Basophils Absolute: 0 10*3/uL (ref 0.0–0.1)
EOS ABS: 0.2 10*3/uL (ref 0.0–0.7)
EOS PCT: 2 % (ref 0–5)
HEMATOCRIT: 29 % — AB (ref 36.0–46.0)
HEMOGLOBIN: 9.3 g/dL — AB (ref 12.0–15.0)
Lymphocytes Relative: 22 % (ref 12–46)
Lymphs Abs: 1.6 10*3/uL (ref 0.7–4.0)
MCH: 27.4 pg (ref 26.0–34.0)
MCHC: 32.1 g/dL (ref 30.0–36.0)
MCV: 85.5 fL (ref 78.0–100.0)
MONO ABS: 0.5 10*3/uL (ref 0.1–1.0)
Monocytes Relative: 7 % (ref 3–12)
Neutro Abs: 4.9 10*3/uL (ref 1.7–7.7)
Neutrophils Relative %: 69 % (ref 43–77)
Platelets: 167 10*3/uL (ref 150–400)
RBC: 3.39 MIL/uL — ABNORMAL LOW (ref 3.87–5.11)
RDW: 16 % — ABNORMAL HIGH (ref 11.5–15.5)
WBC: 7.2 10*3/uL (ref 4.0–10.5)

## 2014-04-13 LAB — COMPREHENSIVE METABOLIC PANEL
ALT: 11 U/L (ref 0–35)
AST: 17 U/L (ref 0–37)
Albumin: 3.1 g/dL — ABNORMAL LOW (ref 3.5–5.2)
Alkaline Phosphatase: 68 U/L (ref 39–117)
BUN: 17 mg/dL (ref 6–23)
CALCIUM: 9.6 mg/dL (ref 8.4–10.5)
CO2: 25 mEq/L (ref 19–32)
Chloride: 106 mEq/L (ref 96–112)
Creatinine, Ser: 1.05 mg/dL (ref 0.50–1.10)
GFR calc Af Amer: 60 mL/min — ABNORMAL LOW (ref >90)
GFR calc non Af Amer: 52 mL/min — ABNORMAL LOW (ref >90)
Glucose, Bld: 96 mg/dL (ref 70–99)
Potassium: 4.2 mEq/L (ref 3.7–5.3)
Sodium: 145 mEq/L (ref 137–147)
TOTAL PROTEIN: 6.2 g/dL (ref 6.0–8.3)
Total Bilirubin: 0.5 mg/dL (ref 0.3–1.2)

## 2014-04-13 LAB — GLUCOSE, CAPILLARY: Glucose-Capillary: 109 mg/dL — ABNORMAL HIGH (ref 70–99)

## 2014-04-13 MED ORDER — AMIODARONE HCL 400 MG PO TABS: 400.0000 mg | ORAL_TABLET | Freq: Every day | ORAL | Status: DC

## 2014-04-13 MED ORDER — METOPROLOL TARTRATE 50 MG PO TABS: 25.0000 mg | ORAL_TABLET | Freq: Two times a day (BID) | ORAL | Status: DC

## 2014-04-13 MED ORDER — BLOOD GLUC METER DISP-STRIPS DEVI
Status: DC
Start: 2014-04-13 — End: 2014-04-19

## 2014-04-13 MED ORDER — TIOTROPIUM BROMIDE MONOHYDRATE 18 MCG IN CAPS: 18.0000 ug | ORAL_CAPSULE | Freq: Every day | RESPIRATORY_TRACT | Status: DC

## 2014-04-13 MED ORDER — BLOOD GLUCOSE METER KIT
PACK | Status: DC
Start: 2014-04-13 — End: 2014-04-19

## 2014-04-13 MED ORDER — FUROSEMIDE 40 MG PO TABS: 20.0000 mg | ORAL_TABLET | Freq: Every day | ORAL | Status: DC

## 2014-04-13 MED ORDER — GLIMEPIRIDE 1 MG PO TABS: 1.0000 mg | ORAL_TABLET | Freq: Every day | ORAL | Status: DC

## 2014-04-13 MED ORDER — LANCETS MISC: Status: DC

## 2014-04-13 NOTE — Discharge Summary (Signed)
Physician Discharge Summary  [name]:[id number] DOB: Jul 13, 1941 DOA: 04/09/2014  PCP: Nyoka Cowden, MD  Admit date: 04/09/2014 Discharge date: 04/13/2014  Time spent: >44mnutes  Recommendations for Outpatient Follow-up:  Follow-up Information   Follow up with SForbes Cellar MD. Schedule an appointment as soon as possible for a visit in 2 months.   Specialties:  Neurology, Radiology   Contact information:   [address] [phone]      Follow up with NDorothy Spark MD. (Our office will call you for a follow-up appointment and labwork to recheck your blood count. Please call the office if you have not heard from [place]. You do NOT need to keep your appointment with Dr. TLovena Le)    Specialty:  Cardiology   Contact information:   [phone]      Follow up with LTinnie Gens MD. (in 620moas directed with carotid duplex in his office at that time)    Specialty:  Vascular Surgery   Contact information:   [id number]     Follow up with KWNyoka CowdenMD. 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 year old female with multiple medical problems including CAD s/p CABG 203329COPD, Diastolic CHF, Paroxysmal a flutter on xarelto, PVD presenting with presyncope. Pt got off work  and was getting the mail, when she saw newspaper on the ground. Pt states that as she went to the ground, she was unable to stop here momentum and ended up falling on the ground. Pt denies any CP, SOB, weakness prior to fall. No hemiparesis or confusion. No LOC. No head trauama. Pt relays that she may have been a little dehydrated. Pt is unclear how long she was on the ground. Pt was found by someone driving by and helped up. Pt went inside, ate and took a nap. Pt denies any confusion prior to or after incident. Pt does report some intermittent HA with eye pain over past month. Noted prior hx/o migraines in the past. Denies HA today. Pt's son called and pt told him about incident. Pt's son contacted pt's daughter who contacted PCP with recs for pt to go to ER via EMS. Patient to the baby aspirin and some nausea medicine prior to EMS arrival.  On presentation, SPBs 120s-170s initially. Now in 90s-100s. Labs essentially stable. Hgb 10.8, K 3.5, Cr 1.33, Ca 11.2. CXR WNL. Head CT shows age related atrophy. EKG and trop WNL.  Pt noted to have been admitted last month for a cardiac catheterization status post abnormal Myoview. Cardiac catheterization showed three-vessel occlusion of the coronary artery with the left internal mammary  graft being unable to be fully assessed. Plan is for patient to obtaining CT angiogram last week. However, this was not able to be done as the machine was broken at the time. She was admitted for further eval and management.   Hospital Course:  Acute infarct, right parietal lobe  -As discussed above upon admission patient had a CT scan of head which showed no acute findings an  MRI 5/13 showed no acute nonhemorrhagic infarct of the right parietal lobe, 10 mm. Patient had been  on xarelto and aspirin prior to admission and these were continued -Neurology was consulted and they saw the patient and recommended to continue Xarelto (does not need to be on aspirin in addition per Dr. Pearlean Brownie)  - It was noted that patient had had carotid Doppler as done on 5/7 per Dr. Delton See and that this had showed an abnormality>> vascular surgery was consulted and CT angiogram of the neck was done and showed high-grade complex plaque involving the proximal innominate artery with extensive calcification and irregularity estimated at least 90% stenosis, and estimated 75-80% calcific stenosis of left internal carotid artery origin noted. Dr. Hart Rochester saw the patient and recommended no surgical intervention for the innominate stenosis. Regarding the left ICA stenosis he recommended outpatient followup in 6 months carotid Doppler to be repeated at that time in his office. -A1c was done to complete stroke workup and was elevated at 6.9, patient's sugars were monitored and she was covered with sliding scale insulin. She'll be discharged on Amaryl and is to followup with her PCP for continued monitoring and dose adjustment as clinically appropriate.  -PT OT was consulted and no skilled needs recommended. -She is to follow up outpatient with Dr. Pearlean Brownie in 2 months and with Dr. Hart Rochester as directed  Diabetes mellitus-new  As above A1c elevated 6.9, her Accu-Cheks were monitored and she was covered with sliding scale insulin. She was placed on modified carbohydrate diet. She is also to followup for outpatient diabetes teaching -She was started on low-dose Amaryl, her blood sugars monitored overnight with no hypoglycemic episodes -She is to followup with her PCP for continued monitoring and adjustment of her occasion doses as clinically appropriate for maintaining optimal blood glucose control. Innominate high-grade stenosis  -Per CT angiogram of neck >> patient was seen by vascular surgery>> no surgical intervention  recommended at this time patient to follow up outpatient  CAD/CHF/atrial flutter  -Cardiac enzymes negative. Patient had an episode of A. fib/flutter rapid ventricular response and was hypotensive at the time and so cardiology started patient on amiodarone, Cardizem was DC'd in the hospital, metoprolol dose decreased. She is to continue current amiodarone and followup with Dr. Delton See -Patient had requested for coronary CT to be done while she was inpatient, cardiology was consulted and initially planned to do a coronary CT but due to borderline BP (would need to use NTG which would lower BP further), metoprolol dose was decreased and her blood pressures did improve on followup but Dr. Tenny Craw followed up and decided that coronary CT would not be done this admission she is to follow up outpatient with Dr. Delton See  Anemia  -Hemoglobin is 9.5 with no gross evidence of bleeding -she is follow up with PCP next week for recheck hemoglobin and further management as appropriate  AKI  -I secondary to volume depletion, resolved with hydration  -Her Lasix dose was decreased on discharge Peripheral vascular disease  -Continue Pletal  Hypercalcemia  -Resolved with hydration  Hypokalemia  - Her  potassium was replaced   Procedures:  2-D echo Study Conclusions  - Left ventricle: The cavity size was normal. Wall thickness was normal. Systolic function was normal. The estimated ejection fraction was in the range of 50% to 55%. Moderate hypokinesis of the mid-distalanteroseptal myocardium. Features are consistent with a pseudonormal left ventricular filling pattern, with concomitant abnormal relaxation and increased filling pressure (grade 2 diastolic dysfunction). - Aortic valve: Mildly to moderately calcified annulus. Mildly calcified leaflets. Valve area: 0.96cm^2(VTI). Valve area: 1cm^2 (Vmax). - Mitral valve: Mild regurgitation. - Left atrium: The atrium was severely dilated. - Right ventricle: The  cavity size was mildly dilated. Systolic function was mildly reduced. - Pulmonary arteries: Systolic pressure was mildly increased. PA peak pressure: 73m Hg (S).    Consultations:  Neurology  Cardiology  Vascular surgery  Discharge Exam: Filed Vitals:   04/13/14 0520  BP: 131/49  Pulse: 83  Temp: 98 F (36.7 C)  Resp: 16  Exam:  General: alert & oriented x 3 In NAD  Cardiovascular: RRR, nl S1 s2  Respiratory: CTAB  Abdomen: soft +BS NT/ND, no masses palpable  Extremities: No cyanosis and no edema  Neuro: Alert and oriented x3 creatinine is 2-12 grossly intact strength is normal and symmetric. Sensory grossly intact     Discharge Instructions You were cared for by a hospitalist during your hospital stay. If you have any questions about your discharge medications or the care you received while you were in the hospital after you are discharged, you can call the unit and asked to speak with the hospitalist on call if the hospitalist that took care of you is not available. Once you are discharged, your primary care physician will handle any further medical issues. Please note that NO REFILLS for any discharge medications will be authorized once you are discharged, as it is imperative that you return to your primary care physician (or establish a relationship with a primary care physician if you do not have one) for your aftercare needs so that they can reassess your need for medications and monitor your lab values.  Discharge Instructions   Ambulatory referral to Nutrition and Diabetic Education    Complete by:  As directed   New diagnosis of DM.  A1c 6.6%.  PCP is Dr. KBurnice Logan  Not sure if patient will be d/c'd home on oral medications.  Patient: Please call the [facility] to schedule an appointment for diabetes education if you do not hear from the center before discharge  [phone]     Diet Carb Modified     Complete by:  As directed      Increase activity slowly    Complete by:  As directed             Medication List    STOP taking these medications       aspirin 81 MG tablet     diltiazem 120 MG 24 hr capsule  Commonly known as:  CARDIZEM CD     hydrochlorothiazide 25 MG tablet  Commonly known as:  HYDRODIURIL     meloxicam 7.5 MG tablet  Commonly known as:  MOBIC      TAKE these medications       amiodarone 400 MG tablet  Commonly known as:  PACERONE  Take 1 tablet (400 mg total) by mouth daily.     atorvastatin 40 MG tablet  Commonly known as:  LIPITOR  Take 1 tablet (40 mg total)  by mouth every evening.     BLOOD GLUCOSE METER DISPOSABLE Devi  Check blood glucose before meals and at bedtime     Blood Glucose Meter kit  Use as instructed     CENTRUM SILVER ULTRA WOMENS PO  Take by mouth.     cilostazol 100 MG tablet  Commonly known as:  PLETAL  Take 100 mg by mouth 2 (two) times daily.     furosemide 40 MG tablet  Commonly known as:  LASIX  Take 0.5 tablets (20 mg total) by mouth daily.     gabapentin 600 MG tablet  Commonly known as:  NEURONTIN  Take 1 tablet (600 mg total) by mouth 3 (three) times daily.     glimepiride 1 MG tablet  Commonly known as:  AMARYL  Take 1 tablet (1 mg total) by mouth daily with breakfast.     HYDROcodone-acetaminophen 5-325 MG per tablet  Commonly known as:  NORCO/VICODIN  Take 0.5 tablets by mouth every 6 (six) hours as needed for moderate pain.     Lancets Misc  For blood glucose checks     LORazepam 0.5 MG tablet  Commonly known as:  ATIVAN  Take 0.5 mg by mouth 2 (two) times daily as needed for anxiety.     losartan 25 MG tablet  Commonly known as:  COZAAR  Take 25 mg by mouth daily.     metoprolol 50 MG tablet  Commonly known as:  LOPRESSOR  Take 0.5 tablets (25 mg total) by mouth 2 (two) times daily.     potassium chloride 10 MEQ tablet  Commonly known as:  K-DUR  Take 1 tablet (10 mEq total) by  mouth daily.     promethazine 25 MG tablet  Commonly known as:  PHENERGAN  Take 1 tablet (25 mg total) by mouth every 6 (six) hours as needed for nausea or vomiting.     rivaroxaban 20 MG Tabs tablet  Commonly known as:  XARELTO  Take 1 tablet (20 mg total) by mouth daily with supper.     sertraline 50 MG tablet  Commonly known as:  ZOLOFT  Take 50 mg by mouth daily.     tiotropium 18 MCG inhalation capsule  Commonly known as:  SPIRIVA  Place 1 capsule (18 mcg total) into inhaler and inhale daily.       Allergies  Allergen Reactions  . Codeine Phosphate     REACTION: unspecified       Follow-up Information   Follow up with Forbes Cellar, MD. Schedule an appointment as soon as possible for a visit in 2 months.   Specialties:  Neurology, Radiology   Contact information:   [address] [place] [address] [phone]       Follow up with Dorothy Spark, MD. (Our office will call you for a follow-up appointment and labwork to recheck your blood count. Please call the office if you have not heard from [place] within 3 days. You do NOT need to keep your appointment with Dr. Lovena Le.)    Specialty:  Cardiology   Contact information:   [place] [address] [phone]       Follow up with Tinnie Gens, MD. (in 53mos as directed with carotid duplex in his office at that time)    Specialty:  Vascular Surgery   Contact information:   [address] [place] [address] [phone]       Follow up with Bluford Kaufmann  Pilar Plate, MD. (in [place], call for appt upon discharge)    Specialty:  Internal Medicine   Contact information:   [place] [address] [phone]        The results of significant diagnostics from this hospitalization (including imaging, microbiology, ancillary and laboratory) are listed below for reference.    Significant Diagnostic Studies: Dg Chest 2 View  04/09/2014    CLINICAL DATA:  Loss of consciousness  EXAM: CHEST  2 VIEW  COMPARISON:  03/08/2014  FINDINGS: Lungs are essentially clear. No focal consolidation. No pleural effusion or pneumothorax.  The heart is normal in size. Postsurgical changes related to prior CABG.  Mild degenerative changes of the visualized thoracolumbar spine.  IMPRESSION: No evidence of acute cardiopulmonary disease.   Electronically Signed   By: Julian Hy M.D.   On: 04/09/2014 18:42   Ct Head Wo Contrast  04/09/2014   CLINICAL DATA:  Loss of consciousness  EXAM: CT HEAD WITHOUT CONTRAST  TECHNIQUE: Contiguous axial images were obtained from the base of the skull through the vertex without intravenous contrast.  COMPARISON:  None.  FINDINGS: There is calcification of the carotid arteries. There is vertebrobasilar calcification as well. There is moderate diffuse atrophy. There is mild low attenuation in the deep white matter consistent with chronic involutional change. There is no evidence of vascular territory infarct. There is a prominent perivascular space in the medial left temporal lobe. This is an incidental finding. There is no evidence of parenchymal hemorrhage or extra-axial fluid. The calvarium is intact with no significant inflammatory change in the visualized sinuses.  IMPRESSION: Age-related atrophy and involutional change with no acute findings.   Electronically Signed   By: Skipper Cliche M.D.   On: 04/09/2014 17:55   Ct Angio Neck W/cm &/or Wo/cm  04/12/2014   CLINICAL DATA:  Syncopal episode. Atrial fibrillation. Acute right parietal infarct. Question subclavian steal.  EXAM: CT ANGIOGRAPHY NECK  TECHNIQUE: Multidetector CT imaging of the neck was performed using the standard protocol during bolus administration of intravenous contrast. Multiplanar CT image reconstructions and MIPs were obtained to evaluate the vascular anatomy. Carotid stenosis measurements (when applicable) are obtained utilizing NASCET criteria, using  the distal internal carotid diameter as the denominator.  CONTRAST:  28m OMNIPAQUE IOHEXOL 350 MG/ML SOLN  COMPARISON:  MR head 04/10/2014.  CT head 04/09/2014  FINDINGS: Moderate atheromatous change transverse arch.  High-grade complex plaque involving the proximal innominate with extensive calcification and multiple non contiguous intraplaque channels, is estimated to be at least 90% stenosis.  Non stenotic calcification at the origins of the right common carotid and right subclavian arteries. No right vertebral ostial stenosis, but moderate eccentric plaque throughout the proximal and mid right vertebral.  Non stenotic atheromatous change at the origin of the left common carotid. Widely patent left carotid to subclavian bypass. Occluded proximal left subclavian. Left vertebral is patent but smaller than the right. Minor eccentric atherosclerotic calcifications are noted in the proximal and mid left vertebral.  At the right carotid bifurcation, there is non stenotic calcific and noncalcified plaque. Proximal/distal ratio of 2.6/4.3 yields less than a 50% stenosis.  At the left carotid bifurcation, there is significant narrowing due to heavily calcified plaque correlating with the Doppler findings. Proximal/distal ratio of 1.0/4.6 correlates with a 75-80% stenosis.  Both vertebrals contribute to formation of the basilar, with a right dominant. No definite flow reducing distal vertebral stenosis.  No neck masses. COPD. Mild biapical scarring. No mediastinal adenopathy.  Review of the MIP images confirms the above findings.  IMPRESSION: High-grade complex plaque involving the proximal innominate artery with extensive calcification in irregularity, estimated at least 90% stenosis.  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  Estimated 75-80% calcific stenosis left internal carotid artery origin.  Non stenotic atheromatous change right carotid bifurcation.  Right vertebral dominant  without significant proximal flow reducing lesion. Left vertebral patent.   Electronically Signed   By: Rolla Flatten M.D.   On: 04/12/2014 11:58   Mr Brain Wo Contrast  04/10/2014   CLINICAL DATA:  Presyncope.  Headache.  The examination had to be discontinued prior to completion due to nausea and anxiety.  EXAM: MRI HEAD WITHOUT CONTRAST  TECHNIQUE: Multiplanar, multiecho pulse sequences of the brain and surrounding structures were obtained without intravenous contrast.  COMPARISON:  CT head without contrast 04/09/2014.  FINDINGS: An acute nonhemorrhagic when infarct is present in the right parietal lobe measuring 10 mm. Focal T2 hyperintensity is associated.  Mild generalized atrophy is present. No hemorrhage or mass lesion is evident. The ventricles are of normal size. No significant extra-axial fluid collection is present.  Flow is present in the major intracranial arteries. The patient is status post bilateral lens extractions. The paranasal sinuses and mastoid air cells are clear.  IMPRESSION: 1. 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   Electronically Signed   By: Lawrence Santiago M.D.   On: 04/10/2014 18:39    Microbiology: No results found for this or any previous visit (from the past 240 hour(s)).   Labs: Basic Metabolic Panel:  Recent Labs Lab 04/09/14 1644 04/09/14 2101 04/10/14 0129 04/11/14 0408 04/12/14 0510 04/13/14 0417  NA 139  --  142 144 144 145  K 3.5*  --  3.0* 4.5 4.3 4.2  CL 96  --  99 106 107 106  CO2 26  --  _0 GLUCOSE 98  --  104* 111* 100* 96  BUN 30*  --  28* _1 CREATININE 1.33*  --  1.20* 0.92 1.04 1.05  CALCIUM 11.2* 10.8* 10.1 9.9 9.5 9.6   Liver Function Tests:  Recent Labs Lab 04/09/14 1644 04/10/14 0129 04/11/14 0408 04/12/14 0510 04/13/14 0417  AST _2 ALT _3 ALKPHOS 83 68 64 61 68  BILITOT 0.2* 0.3 0.3 0.4 0.5  PROT 7.5 6.2 6.1 5.8* 6.2  ALBUMIN 3.8 3.1* 3.1* 3.0*  3.1*   No results found for this basename: LIPASE, AMYLASE,  in the last 168 hours No results found for this basename: AMMONIA,  in the last 168 hours CBC:  Recent Labs Lab 04/09/14 1644 04/10/14 0129 04/11/14 0408 04/12/14 0510 04/13/14 0417  WBC 7.6 7.0 6.4 5.3 7.2  NEUTROABS 4.7 4.2 3.7 3.1 4.9  HGB 10.8* 9.5* 9.6* 8.8* 9.3*  HCT 33.8* 29.9* 30.3* 27.6* 29.0*  MCV 83.9 83.8 85.8 86.3 85.5  PLT 188 170 190 161 167   Cardiac Enzymes:  Recent Labs Lab 04/09/14 1644 04/09/14 2101 04/10/14 0128 04/10/14 0707  TROPONINI <0.30 <0.30 <0.30 <0.30   BNP: BNP (last 3 results)  Recent Labs  04/09/14 2101  PROBNP 678.0*   CBG:  Recent Labs Lab 04/12/14 0739 04/12/14 1149 04/12/14 1654 04/12/14 2150 04/13/14 0823  GLUCAP 105* 99 96 105* 109*       Signed:  Mckenzie Toruno C Olar Santini  [facility] 04/13/2014, 9:31 AM

## 2014-04-15 ENCOUNTER — Telehealth: Payer: Self-pay | Admitting: Cardiology

## 2014-04-15 ENCOUNTER — Telehealth: Payer: Self-pay | Admitting: Vascular Surgery

## 2014-04-15 NOTE — Telephone Encounter (Signed)
LMTCB so we can follow up with her on her med questions

## 2014-04-15 NOTE — Telephone Encounter (Signed)
New message          Pt has a question about her medications that she is currently taking. Please give pt a call.

## 2014-04-15 NOTE — Telephone Encounter (Signed)
Pt calling because she thinks that since she was discharged from the Hospital she has been placed on too many meds. Pt wanting to know could she eliminate some of her medications.  Advised pt to keep taking all the medications that she was discharged from the hospital with and to come in for her consult appointment with Dr Lovena Le on 5/28 and follow-up appointment with Dr Meda Coffee on 6/1. Informed pt that at these appointments the Dr can reevaluate her medications and further advise.  Pt denies any CP or SOB. No complications experienced by pt since discharge from [facility] pt I will forward this to Dr Meda Coffee for further review.  Pt verbalized understanding and pleased with the call back

## 2014-04-15 NOTE — Telephone Encounter (Addendum)
Message copied by Gena Fray on Mon Apr 15, 2014  2:18 PM ------      Message from: [place], [place] K      Created: Fri Apr 12, 2014  1:07 PM      Regarding: Schedule                   ----- Message -----         From: Mal Misty, MD         Sent: 04/12/2014  12:32 PM           To: [facility] Charge Pool            04/12/2014      Consult by Dr. Kellie Simmering             Level IV new patient            Diagnosis severe innominate stenosis with possible amaurosis fugax right eye      Severe left ICA stenosis      Status post aortobifemoral bypass grafting and bilateral femoral-popliteal bypass grafting and left common carotid to subclavian bypass grafting and past            Return in 6 months to our office with carotid duplex exam ------  04/15/14: spoke with pt to schedule, dpm

## 2014-04-15 NOTE — Telephone Encounter (Signed)
Will forward to [place] with Dr Meda Coffee

## 2014-04-16 NOTE — Telephone Encounter (Signed)
Unfortunately, she has to take these medicines unless she has side effects. We will discuss at the next appointment. Ask her how is she doing, Thank you, K

## 2014-04-16 NOTE — Telephone Encounter (Signed)
Pt contacted about her concerns with her medications since being discharged and per Dr Meda Coffee the pt needs to continue on current medication regimen until further advised by Dr Meda Coffee at next follow up appt on April 29, 2014. Pt states she is feeling better.  Pt verbalized understanding and agrees with current plan.

## 2014-04-19 ENCOUNTER — Other Ambulatory Visit: Payer: Self-pay | Admitting: *Deleted

## 2014-04-25 ENCOUNTER — Encounter: Payer: Self-pay | Admitting: Internal Medicine

## 2014-04-25 ENCOUNTER — Ambulatory Visit (INDEPENDENT_AMBULATORY_CARE_PROVIDER_SITE_OTHER): Payer: Medicare Other | Admitting: Internal Medicine

## 2014-04-25 VITALS — BP 142/78 | HR 65 | Ht 62.5 in | Wt 151.8 lb

## 2014-04-25 DIAGNOSIS — I4892 Unspecified atrial flutter: Secondary | ICD-10-CM

## 2014-04-25 DIAGNOSIS — R55 Syncope and collapse: Secondary | ICD-10-CM

## 2014-04-25 NOTE — Assessment & Plan Note (Signed)
She appears to be maintaining NSR very nicely. I have recommended she continue her amio 400 mg daily for another month, then decrease down to 200 mg daily. She will continue systemic anti-coagulation. My expectations is that she also has atrial fibrillation.

## 2014-04-25 NOTE — Patient Instructions (Signed)
Your physician recommends that you schedule a follow-up appointment in: 07/2014 or Oct/2015  Your physician has recommended you make the following change in your medication:  1) Decrease Amiodarone to 200mg  daily on 05/29/2014

## 2014-04-25 NOTE — Progress Notes (Signed)
HPI [name] is referred today by Dr. Meda Coffee for evaluation of atrial fibrillation and atrial flutter. She has an extensive past medical history with severe vascular disease including CAD, s/p CABG, peripheral vascular disease with subclavian artery bypass, HTN, and dyslipidemia. In the interim she developed atrial flutter with an RVR which has been only modestly symptomatic. She was placed on systemic anti-coagulation and started on amiodarone. Her arrhythmia appear to have improved. She denies worsening sob. When I reviewed her cardiac monitor it was my impression that she had both atrial fib and flutter and that flutter ablation alone would be unlikely to control her symptoms. Allergies  Allergen Reactions  . Codeine Phosphate     REACTION: unspecified     Current Outpatient Prescriptions  Medication Sig Dispense Refill  . amiodarone (PACERONE) 400 MG tablet Take 1 tablet (400 mg total) by mouth daily.  30 tablet  0  . atorvastatin (LIPITOR) 40 MG tablet Take 1 tablet (40 mg total) by mouth every evening.  30 tablet  6  . cilostazol (PLETAL) 100 MG tablet Take 100 mg by mouth 2 (two) times daily.      Marland Kitchen diltiazem (CARDIZEM CD) 120 MG 24 hr capsule Take 1 capsule by mouth daily.      . furosemide (LASIX) 40 MG tablet Take 0.5 tablets (20 mg total) by mouth daily.  90 tablet  6  . gabapentin (NEURONTIN) 600 MG tablet Take 1 tablet (600 mg total) by mouth 3 (three) times daily.  270 tablet  3  . glimepiride (AMARYL) 1 MG tablet Take 1 tablet (1 mg total) by mouth daily with breakfast.  30 tablet  0  . HYDROcodone-acetaminophen (NORCO/VICODIN) 5-325 MG per tablet Take 0.5 tablets by mouth every 6 (six) hours as needed for moderate pain.  90 tablet  0  . LORazepam (ATIVAN) 0.5 MG tablet Take 0.5 mg by mouth 2 (two) times daily as needed for anxiety.      Marland Kitchen losartan (COZAAR) 25 MG tablet Take 25 mg by mouth daily.      . metoprolol (LOPRESSOR) 50 MG tablet Take 25 mg by mouth 2 (two)  times daily. Pt is unsure of what dosage she is on because it's been changed numerous times in the past few months (04/25/14)      . Multiple Vitamins-Minerals (CENTRUM SILVER ULTRA WOMENS PO) Take 1 capsule by mouth daily.       . potassium chloride (K-DUR) 10 MEQ tablet Take 1 tablet (10 mEq total) by mouth daily.  30 tablet  6  . promethazine (PHENERGAN) 25 MG tablet Take 1 tablet (25 mg total) by mouth every 6 (six) hours as needed for nausea or vomiting.  20 tablet  2  . rivaroxaban (XARELTO) 20 MG TABS tablet Take 1 tablet (20 mg total) by mouth daily with supper.  30 tablet  6  . sertraline (ZOLOFT) 50 MG tablet Take 50 mg by mouth daily.       Marland Kitchen tiotropium (SPIRIVA) 18 MCG inhalation capsule Place 1 capsule (18 mcg total) into inhaler and inhale daily.  30 capsule  0   No current facility-administered medications for this visit.     Past Medical History  Diagnosis Date  . ANEMIA 08/28/2010  . CAD (coronary artery disease)     a. s/p CABGx4 in 2001.  Marland Kitchen COLONIC POLYPS, HX OF 06/15/2007  . COPD (chronic obstructive pulmonary disease)   . Depressive disorder   . GERD 12/19/2009  .  Hyperlipidemia   . Hypertension   . HYPOTENSION 08/11/2010  . Osteoarthritis   . PVD (peripheral vascular disease)     a. Duplex 03/2013: stable moderate carotid dz - 65-99% RICA, 35-70% LICA, L subclavian artery to CCA stent widely patent.  b. Prior R fempop bypass graft, R CIA stent.  . Lumbar stenosis   . Lung abscess 2011  . CHF (congestive heart failure)   . Hx of colonoscopy   . Paroxysmal atrial flutter     a. Dx 01/2014, placed on Xarelto.  . Aortic stenosis     a. Mild by echo 01/2013.    ROS:   All systems reviewed and negative except as noted in the HPI.   Past Surgical History  Procedure Laterality Date  . L subclavian bypass  07/2004  . Tubal ligation    . Coronary artery bypass graft  05/2000  . Femoral popliteal bypass-r  04/1999  . Right common iliac pta with stent placement  07/2000    . Right leg blockage  2003  . Aorta bifemoral bypass graft  2004  . Left cea  2005  . Lung cyst biopsy  2011     Family History  Problem Relation Age of Onset  . Diabetes Father   . Heart attack Mother   . Diabetes    . Coronary artery disease    . Colon cancer Neg Hx      History   Social History  . Marital Status: Widowed    Spouse Name: N/A    Number of Children: 2  . Years of Education: N/A   Occupational History  .     Social History Main Topics  . Smoking status: Former Smoker -- 1.50 packs/day for 40 years    Types: Cigarettes    Quit date: 06/29/2000  . Smokeless tobacco: Never Used  . Alcohol Use: No  . Drug Use: No  . Sexual Activity: Not on file   Other Topics Concern  . Not on file   Social History Narrative  . No narrative on file     BP 142/78  Pulse 65  Ht 5' 2.5" (1.588 m)  Wt 151 lb 12.8 oz (68.856 kg)  BMI 27.30 kg/m2  Physical Exam:  stable appearing 73 yo woman, NAD HEENT: Unremarkable Neck:  No JVD, no thyromegally Back:  No CVA tenderness Lungs:  Clear with scattered rales. Decreased breath sounds. HEART:  Regular rate rhythm, no murmurs, no rubs, no clicks Abd:  soft, positive bowel sounds, no organomegally, no rebound, no guarding Ext:  2 plus pulses, no edema, no cyanosis, no clubbing Skin:  No rashes no nodules Neuro:  CN II through XII intact, motor grossly intact  EKG - nsr at 65/min.   Assess/Plan:

## 2014-04-25 NOTE — Assessment & Plan Note (Signed)
No recurrent symptoms. Will follow. She is at risk for post-termination pauses out of atrial fib and flutter.

## 2014-04-26 ENCOUNTER — Ambulatory Visit (INDEPENDENT_AMBULATORY_CARE_PROVIDER_SITE_OTHER): Payer: Medicare Other | Admitting: Internal Medicine

## 2014-04-26 ENCOUNTER — Other Ambulatory Visit: Payer: Self-pay | Admitting: *Deleted

## 2014-04-26 ENCOUNTER — Encounter: Payer: Self-pay | Admitting: Internal Medicine

## 2014-04-26 VITALS — BP 130/62 | HR 46 | Temp 98.3°F | Resp 18 | Ht 62.5 in | Wt 154.0 lb

## 2014-04-26 DIAGNOSIS — I6350 Cerebral infarction due to unspecified occlusion or stenosis of unspecified cerebral artery: Secondary | ICD-10-CM

## 2014-04-26 DIAGNOSIS — I10 Essential (primary) hypertension: Secondary | ICD-10-CM

## 2014-04-26 DIAGNOSIS — E119 Type 2 diabetes mellitus without complications: Secondary | ICD-10-CM | POA: Insufficient documentation

## 2014-04-26 DIAGNOSIS — I639 Cerebral infarction, unspecified: Secondary | ICD-10-CM

## 2014-04-26 DIAGNOSIS — R55 Syncope and collapse: Secondary | ICD-10-CM

## 2014-04-26 DIAGNOSIS — I25810 Atherosclerosis of coronary artery bypass graft(s) without angina pectoris: Secondary | ICD-10-CM

## 2014-04-26 DIAGNOSIS — E785 Hyperlipidemia, unspecified: Secondary | ICD-10-CM

## 2014-04-26 DIAGNOSIS — D649 Anemia, unspecified: Secondary | ICD-10-CM

## 2014-04-26 LAB — CBC WITH DIFFERENTIAL/PLATELET
Basophils Absolute: 0 10*3/uL (ref 0.0–0.1)
Basophils Relative: 0.3 % (ref 0.0–3.0)
EOS PCT: 1.3 % (ref 0.0–5.0)
Eosinophils Absolute: 0.1 10*3/uL (ref 0.0–0.7)
HEMATOCRIT: 33.7 % — AB (ref 36.0–46.0)
Hemoglobin: 11 g/dL — ABNORMAL LOW (ref 12.0–15.0)
Lymphocytes Relative: 23.7 % (ref 12.0–46.0)
Lymphs Abs: 1.7 10*3/uL (ref 0.7–4.0)
MCHC: 32.7 g/dL (ref 30.0–36.0)
MCV: 82.5 fl (ref 78.0–100.0)
MONOS PCT: 10.5 % (ref 3.0–12.0)
Monocytes Absolute: 0.8 10*3/uL (ref 0.1–1.0)
Neutro Abs: 4.7 10*3/uL (ref 1.4–7.7)
Neutrophils Relative %: 64.2 % (ref 43.0–77.0)
PLATELETS: 248 10*3/uL (ref 150.0–400.0)
RBC: 4.08 Mil/uL (ref 3.87–5.11)
RDW: 16.3 % — ABNORMAL HIGH (ref 11.5–15.5)
WBC: 7.3 10*3/uL (ref 4.0–10.5)

## 2014-04-26 LAB — GLUCOSE, POCT (MANUAL RESULT ENTRY): POC GLUCOSE: 127 mg/dL — AB (ref 70–99)

## 2014-04-26 MED ORDER — GLUCOSE BLOOD VI STRP: 1.0000 | ORAL_STRIP | Freq: Every day | Status: DC | PRN

## 2014-04-26 MED ORDER — LORAZEPAM 0.5 MG PO TABS: 0.5000 mg | ORAL_TABLET | Freq: Two times a day (BID) | ORAL | Status: DC | PRN

## 2014-04-26 MED ORDER — ACCU-CHEK SOFTCLIX LANCETS MISC: 1.0000 | Freq: Every day | Status: DC | PRN

## 2014-04-26 MED ORDER — METFORMIN HCL 500 MG PO TABS: 500.0000 mg | ORAL_TABLET | Freq: Two times a day (BID) | ORAL | Status: DC

## 2014-04-26 NOTE — Patient Instructions (Addendum)
Limit your sodium (Salt) intake   Please check your hemoglobin A1c every 3 months  Glucose, Blood Sugar, Fasting Blood Sugar This is a test to measure your blood sugar. Glucose is a simple sugar that serves as the main source of energy for the body. The carbohydrates we eat are broken down into glucose (and a few other simple sugars), absorbed by the small intestine, and circulated throughout the body. Most of the body's cells require glucose for energy production; brain and nervous system cells not only rely on glucose for energy, they can only function when glucose levels in the blood remain above a certain level.  The body's use of glucose hinges on the availability of insulin, a hormone produced by the pancreas. Insulin acts as a Control and instrumentation engineer, transporting glucose into the body's cells, directing the body to store excess glucose as glycogen (for short-term storage) and/or as triglycerides in fat cells. We can not live without glucose or insulin, and they must be in balance.  Normally, blood glucose levels rise slightly after a meal, and insulin is secreted to lower them, with the amount of insulin released matched up with the size and content of the meal. If blood glucose levels drop too low, such as might occur in between meals or after a strenuous workout, glucagon (another pancreatic hormone) is secreted to tell the liver to turn some glycogen back into glucose, raising the blood glucose levels. If the glucose/insulin feedback mechanism is working properly, the amount of glucose in the blood remains fairly stable. If the balance is disrupted and glucose levels in the blood rise, then the body tries to restore the balance, both by increasing insulin production and by excreting glucose in the urine.  PREPARATION FOR TEST A blood sample drawn from a vein in your arm or, for a self check, a drop of blood from a skin prick; in general, it may be recommended that you fast before having a blood glucose  test; sometimes a random (no preparation) urine sample is used. Your caregiver will instruct you as to what they want prior to your testing. NORMAL FINDINGS Normal values depend on many factors. Your lab will provide a range of normal values with your test results. The following information summarizes the meaning of the test results. These are based on the clinical practice recommendations of the American Diabetes Association.  FASTING BLOOD GLUCOSE  From 70 to 99 mg/dL (3.9 to 5.5 mmol/L): Normal glucose tolerance  From 100 to 125 mg/dL (5.6 to 6.9 mmol/L):Impaired fasting glucose (pre-diabetes)  126 mg/dL (7.0 mmol/L) and above on more than one testing occasion: Diabetes ORAL GLUCOSE TOLERANCE TEST (OGTT) [EXCEPT PREGNANCY] (2 HOURS AFTER A 75-GRAM GLUCOSE DRINK)  Less than 140 mg/dL (7.8 mmol/L): Normal glucose tolerance  From 140 to 200 mg/dL (7.8 to 11.1 mmol/L): Impaired glucose tolerance (pre-diabetes)  Over 200 mg/dL (11.1 mmol/L) on more than one testing occasion: Diabetes GESTATIONAL DIABETES SCREENING: GLUCOSE CHALLENGE TEST (1 HOUR AFTER A 50-GRAM GLUCOSE DRINK)  Less than 140* mg/dL (7.8 mmol/L): Normal glucose tolerance  140* mg/dL (7.8 mmol/L) and over: Abnormal, needs OGTT (see below) * Some use a cutoff of More Than 130 mg/dL (7.2 mmol/L) because that identifies 90% of women with gestational diabetes, compared to 80% identified using the threshold of More Than 140 mg/dL (7.8 mmol/L). GESTATIONAL DIABETES DIAGNOSTIC: OGTT (100-GRAM GLUCOSE DRINK)  Fasting*..........................................95 mg/dL (5.3 mmol/L)  1 hour after glucose load*..............180 mg/dL (10.0 mmol/L)  2 hours after glucose load*.............155 mg/dL (8.6 mmol/L)  3 hours after glucose load* **.........140 mg/dL (7.8 mmol/L) * If two or more values are above the criteria, gestational diabetes is diagnosed. ** A 75-gram glucose load may be used, although this method is not as well  validated as the 100-gram OGTT; the 3-hour sample is not drawn if 75 grams is used.  Ranges for normal findings may vary among different laboratories and hospitals. You should always check with your doctor after having lab work or other tests done to discuss the meaning of your test results and whether your values are considered within normal limits. MEANING OF TEST  Your caregiver will go over the test results with you and discuss the importance and meaning of your results, as well as treatment options and the need for additional tests if necessary. OBTAINING THE TEST RESULTS It is your responsibility to obtain your test results. Ask the lab or department performing the test when and how you will get your results. Document Released: 12/17/2004 Document Revised: 02/07/2012 Document Reviewed: 10/26/2008 [facility] Patient Information 2014 [place], [place]. Diabetes and Standards of Medical Care  Diabetes is complicated. You may find that your diabetes team includes a dietitian, nurse, diabetes educator, eye doctor, and more. To help everyone know what is going on and to help you get the care you deserve, the following schedule of care was developed to help keep you on track. Below are the tests, exams, vaccines, medicines, education, and plans you will need. HbA1c test This test shows how well you have controlled your glucose over the past 2 3 months. It is used to see if your diabetes management plan needs to be adjusted.   It is performed at least 2 times a year if you are meeting treatment goals.  It is performed 4 times a year if therapy has changed or if you are not meeting treatment goals. Blood pressure test  This test is performed at every routine medical visit. The goal is less than 140/90 mmHg for most people, but 130/80 mmHg in some cases. Ask your health care provider about your goal. Dental exam  Follow up with the dentist regularly. Eye exam  If you are diagnosed with type 1  diabetes as a child, get an exam upon reaching the age of 1 years or older and have had diabetes for 3 5 years. Yearly eye exams are recommended after that initial eye exam.  If you are diagnosed with type 1 diabetes as an adult, get an exam within 5 years of diagnosis and then yearly.  If you are diagnosed with type 2 diabetes, get an exam as soon as possible after the diagnosis and then yearly. Foot care exam  Visual foot exams are performed at every routine medical visit. The exams check for cuts, injuries, or other problems with the feet.  A comprehensive foot exam should be done yearly. This includes visual inspection as well as assessing foot pulses and testing for loss of sensation.  Check your feet nightly for cuts, injuries, or other problems with your feet. Tell your health care provider if anything is not healing. Kidney function test (urine microalbumin)  This test is performed once a year.  Type 1 diabetes: The first test is performed 5 years after diagnosis.  Type 2 diabetes: The first test is performed at the time of diagnosis.  A serum creatinine and estimated glomerular filtration rate (eGFR) test is done once a year to assess the level of chronic kidney disease (CKD), if present. Lipid profile (cholesterol, HDL,  LDL, triglycerides)  Performed every 5 years for most people.  The goal for LDL is less than 100 mg/dL. If you are at high risk, the goal is less than 70 mg/dL.  The goal for HDL is 40 mg/dL 50 mg/dL for men and 50 mg/dL 60 mg/dL for women. An HDL cholesterol of 60 mg/dL or higher gives some protection against heart disease.  The goal for triglycerides is less than 150 mg/dL. Influenza vaccine, pneumococcal vaccine, and hepatitis B vaccine  The influenza vaccine is recommended yearly.  The pneumococcal vaccine is generally given once in a lifetime. However, there are some instances when another vaccination is recommended. Check with your health care  provider.  The hepatitis B vaccine is also recommended for adults with diabetes. Diabetes self-management education  Education is recommended at diagnosis and ongoing as needed. Treatment plan  Your treatment plan is reviewed at every medical visit. Document Released: 09/12/2009 Document Revised: 07/18/2013 Document Reviewed: 04/17/2013 [facility] Patient Information 2014 [place], [place]. Diabetes Meal Planning Guide The diabetes meal planning guide is a tool to help you plan your meals and snacks. It is important for people with diabetes to manage their blood glucose (sugar) levels. Choosing the right foods and the right amounts throughout your day will help control your blood glucose. Eating right can even help you improve your blood pressure and reach or maintain a healthy weight. CARBOHYDRATE COUNTING MADE EASY When you eat carbohydrates, they turn to sugar. This raises your blood glucose level. Counting carbohydrates can help you control this level so you feel better. When you plan your meals by counting carbohydrates, you can have more flexibility in what you eat and balance your medicine with your food intake. Carbohydrate counting simply means adding up the total amount of carbohydrate grams in your meals and snacks. Try to eat about the same amount at each meal. Foods with carbohydrates are listed below. Each portion below is 1 carbohydrate serving or 15 grams of carbohydrates. Ask your dietician how many grams of carbohydrates you should eat at each meal or snack. Grains and Starches  1 slice bread.   English muffin or hotdog/hamburger bun.   cup cold cereal (unsweetened).   cup cooked pasta or rice.   cup starchy vegetables (corn, potatoes, peas, beans, winter squash).  1 tortilla (6 inches).   bagel.  1 waffle or pancake (size of a CD).   cup cooked cereal.  4 to 6 small crackers. *Whole grain is recommended. Fruit  1 cup fresh unsweetened berries, melon,  papaya, pineapple.  1 small fresh fruit.   banana or mango.   cup fruit juice (4 oz unsweetened).   cup canned fruit in natural juice or water.  2 tbs dried fruit.  12 to 15 grapes or cherries. Milk and Yogurt  1 cup fat-free or 1% milk.  1 cup soy milk.  6 oz light yogurt with sugar-free sweetener.  6 oz low-fat soy yogurt.  6 oz plain yogurt. Vegetables  1 cup raw or  cup cooked is counted as 0 carbohydrates or a "free" food.  If you eat 3 or more servings at 1 meal, count them as 1 carbohydrate serving. Other Carbohydrates   oz chips or pretzels.   cup ice cream or frozen yogurt.   cup sherbet or sorbet.  2 inch square cake, no frosting.  1 tbs honey, sugar, jam, jelly, or syrup.  2 small cookies.  3 squares of graham crackers.  3 cups popcorn.  6 crackers.  1 cup broth-based soup.  Count 1 cup casserole or other mixed foods as 2 carbohydrate servings.  Foods with less than 20 calories in a serving may be counted as 0 carbohydrates or a "free" food. You may want to purchase a book or computer software that lists the carbohydrate gram counts of different foods. In addition, the nutrition facts panel on the labels of the foods you eat are a good source of this information. The label will tell you how big the serving size is and the total number of carbohydrate grams you will be eating per serving. Divide this number by 15 to obtain the number of carbohydrate servings in a portion. Remember, 1 carbohydrate serving equals 15 grams of carbohydrate. SERVING SIZES Measuring foods and serving sizes helps you make sure you are getting the right amount of food. The list below tells how big or small some common serving sizes are.  1 oz.........4 stacked dice.  3 oz........Marland KitchenDeck of cards.  1 tsp.......Marland KitchenTip of little finger.  1 tbs......Marland KitchenMarland KitchenThumb.  2 tbs.......Marland KitchenGolf ball.   cup......Marland KitchenHalf of a fist.  1 cup.......Marland KitchenA fist. SAMPLE DIABETES MEAL  PLAN Below is a sample meal plan that includes foods from the grain and starches, dairy, vegetable, fruit, and meat groups. A dietician can individualize a meal plan to fit your calorie needs and tell you the number of servings needed from each food group. However, controlling the total amount of carbohydrates in your meal or snack is more important than making sure you include all of the food groups at every meal. You may interchange carbohydrate containing foods (dairy, starches, and fruits). The meal plan below is an example of a 2000 calorie diet using carbohydrate counting. This meal plan has 17 carbohydrate servings. Breakfast  1 cup oatmeal (2 carb servings).   cup light yogurt (1 carb serving).  1 cup blueberries (1 carb serving).   cup almonds. Snack  1 large apple (2 carb servings).  1 low-fat string cheese stick. Lunch  Chicken breast salad.  1 cup spinach.   cup chopped tomatoes.  2 oz chicken breast, sliced.  2 tbs low-fat [place] dressing.  12 whole-wheat crackers (2 carb servings).  12 to 15 grapes (1 carb serving).  1 cup low-fat milk (1 carb serving). Snack  1 cup carrots.   cup hummus (1 carb serving). Dinner  3 oz broiled salmon.  1 cup brown rice (3 carb servings). Snack  1  cups steamed broccoli (1 carb serving) drizzled with 1 tsp olive oil and lemon juice.  1 cup light pudding (2 carb servings). DIABETES MEAL PLANNING WORKSHEET Your dietician can use this worksheet to help you decide how many servings of foods and what types of foods are right for you.  BREAKFAST Food Group and Servings / Carb Servings Grain/Starches __________________________________ Dairy __________________________________________ Vegetable ______________________________________ Fruit ___________________________________________ Meat __________________________________________ Fat ____________________________________________ LUNCH Food Group and Servings / Carb  Servings Grain/Starches ___________________________________ Dairy ___________________________________________ Fruit ____________________________________________ Meat ___________________________________________ Fat _____________________________________________ [name] Food Group and Servings / Carb Servings Grain/Starches ___________________________________ Dairy ___________________________________________ Fruit ____________________________________________ Meat ___________________________________________ Fat _____________________________________________ SNACKS Food Group and Servings / Carb Servings Grain/Starches ___________________________________ Dairy ___________________________________________ Vegetable _______________________________________ Fruit ____________________________________________ Meat ___________________________________________ Fat _____________________________________________ DAILY TOTALS Starches _________________________ Vegetable ________________________ Fruit ____________________________ Dairy ____________________________ Meat ____________________________ Fat ______________________________ Document Released: 08/12/2005 Document Revised: 02/07/2012 Document Reviewed: 06/23/2009 [employer] Patient Information 2014 [employer]. Diabetes, Eating Away From Home Sometimes, you might eat in a restaurant or have meals that are prepared by someone else. You can  enjoy eating out. However, the portions in restaurants may be much larger than needed. Listed below are some ideas to help you choose foods that will keep your blood glucose (sugar) in better control.  TIPS FOR EATING OUT  Know your meal plan and how many carbohydrate servings you should have at each meal. You may wish to carry a copy of your meal plan in your purse or wallet. Learn the foods included in each food group.  Make a list of restaurants near you that offer healthy choices. Take a copy of the  carry-out menus to see what they offer. Then, you can plan what you will order ahead of time.  Become familiar with serving sizes by practicing them at home using measuring cups and spoons. Once you learn to recognize portion sizes, you will be able to correctly estimate the amount of total carbohydrate you are allowed to eat at the restaurant. Ask for a takeout box if the portion is more than you should have. When your food comes, leave the amount you should have on the plate, and put the rest in the takeout box before you start eating.  Plan ahead if your mealtime will be different from usual. Check with your caregiver to find out how to time meals and medicine if you are taking insulin.  Avoid high-fat foods, such as fried foods, cream sauces, high-fat salad dressings, or any added butter or margarine.  Do not be afraid to ask questions. Ask your server about the portion size, cooking methods, ingredients and if items can be substituted. Restaurants do not list all available items on the menu. You can ask for your main entree to be prepared using skim milk, oil instead of butter or margarine, and without gravy or sauces. Ask your waiter or waitress to serve salad dressings, gravy, sauces, margarine, and sour cream on the side. You can then add the amount your meal plan suggests.  Add more vegetables whenever possible.  Avoid items that are labeled "jumbo," "giant," "deluxe," or "supersized."  You may want to split an entre with someone and order an extra side salad.  Watch for hidden calories in foods like croutons, bacon, or cheese.  Ask your server to take away the bread basket or chips from your table.  Order a dinner salad as an appetizer. You can eat most foods served in a restaurant. Some foods are better choices than others. Breads and Starches  Recommended: All kinds of bread (wheat, rye, white, oatmeal, [place], [place], raisin), hard or soft dinner rolls, frankfurter or hamburger  buns, small bagels, small corn or whole-wheat flour tortillas.  Avoid: Frosted or glazed breads, butter rolls, egg or cheese breads, croissants, sweet rolls, pastries, coffee cake, glazed or frosted doughnuts, muffins. Crackers  Recommended: Animal crackers, graham, rye, saltine, oyster, and matzoth crackers. Bread sticks, melba toast, rusks, pretzels, popcorn (without fat), zwieback toast.  Avoid: High-fat snack crackers or chips. Buttered popcorn. Cereals  Recommended: Hot and cold cereals. Whole grains such as oatmeal or shredded wheat are good choices.  Avoid: Sugar-coated or granola type cereals. Potatoes/Pasta/Rice/Beans  Recommended: Order baked, boiled, or mashed potatoes, rice or noodles without added fat, whole beans. Order gravies, butter, margarine, or sauces on the side so you can control the amount you add.  Avoid: Hash browns or fried potatoes. Potatoes, pasta, or rice prepared with cream or cheese sauce. Potato or pasta salads prepared with large amounts of dressing. Fried beans or fried rice. Vegetables  Recommended: Order steamed,  baked, boiled, or stewed vegetables without sauces or extra fat. Ask that sauce be served on the side. If vegetables are not listed on the menu, ask what is available.  Avoid: Vegetables prepared with cream, butter, or cheese sauce. Fried vegetables. Salad Bars  Recommended: Many of the vegetables at a salad bar are considered "free." Use lemon juice, vinegar, or low-calorie salad dressing (fewer than 20 calories per serving) as "free" dressings for your salad. Look for salad bar ingredients that have no added fat or sugar such as tomatoes, lettuce, cucumbers, broccoli, carrots, onions, and mushrooms.  Avoid: Prepared salads with large amounts of dressing, such as coleslaw, caesar salad, macaroni salad, bean salad, or carrot salad. Fruit  Recommended: Eat fresh fruit or fresh fruit salad without added dressing. A salad bar often offers fresh  fruit choices, but canned fruit at a restaurant is usually packed in sugar or syrup.  Avoid: Sweetened canned or frozen fruits, plain or sweetened fruit juice. Fruit salads with dressing, sour cream, or sugar added to them. Meat and Meat Substitutes  Recommended: Order broiled, baked, roasted, or grilled meat, poultry, or fish. Trim off all visible fat. Do not eat the skin of poultry. The size stated on the menu is the raw weight. Meat shrinks by  in cooking (for example, 4 oz raw equals 3 oz cooked meat).  Avoid: Deep-fat fried meat, poultry, or fish. Breaded meats. Eggs  Recommended: Order soft, hard-cooked, poached, or scrambled eggs. Omelets may be okay, depending on what ingredients are added. Egg substitutes are also a good choice.  Avoid: Fried eggs, eggs prepared with cream or cheese sauce. Milk  Recommended: Order low-fat or fat-free milk according to your meal plan. Plain, nonfat yogurt or flavored yogurt with no sugar added may be used as a substitute for milk. Soy milk may also be used.  Avoid: Milk shakes or sweetened milk beverages. Soups and Combination Foods  Recommended: Clear broth or consomm are "free" foods and may be used as an appetizer. Broth-based soups with fat removed count as a starch serving and are preferred over cream soups. Soups made with beans or split peas may be eaten but count as a starch.  Avoid: Fatty soups, soup made with cream, cheese soup. Combination foods prepared with excessive amounts of fat or with cream or cheese sauces. Desserts and Sweets  Recommended: Ask for fresh fruit. Sponge or angel food cake without icing, ice milk, no sugar added ice cream, sherbet, or frozen yogurt may fit into your meal plan occasionally.  Avoid: Pastries, puddings, pies, cakes with icing, custard, gelatin desserts. Fats and Oils  Recommended: Choose healthy fats such as olive oil, canola oil, or tub margarine, reduced fat or fat-free sour cream, cream cheese,  avocado, or nuts.  Avoid: Any fats in excess of your allowed portion. Deep-fried foods or any food with a large amount of fat. Note: Ask for all fats to be served on the side, and limit your portion sizes according to your meal plan. Document Released: 11/15/2005 Document Revised: 02/07/2012 Document Reviewed: 06/05/2009 [facility] Patient Information 2014 [place], [place]. Diets for Diabetes, Food Labeling Look at food labels to help you decide how much of a product you can eat. You will want to check the amount of total carbohydrate in a serving to see how the food fits into your meal plan. In the list of ingredients, the ingredient present in the largest amount by weight must be listed first, followed by the other ingredients in descending  order. STANDARD OF IDENTITY Most products have a list of ingredients. However, foods that the Food and Drug Administration (FDA) has given a standard of identity do not need a list of ingredients. A standard of identity means that a food must contain certain ingredients if it is called a particular name. Examples are mayonnaise, peanut butter, ketchup, jelly, and cheese. LABELING TERMS There are many terms found on food labels. Some of these terms have specific definitions. Some terms are regulated by the FDA, and the FDA has clearly specified how they can be used. Others are not regulated or well-defined and can be misleading and confusing. SPECIFICALLY DEFINED TERMS Nutritive Sweetener.  A sweetener that contains calories,such as table sugar or honey. Nonnutritive Sweetener.  A sweetener with few or no calories,such as saccharin, aspartame, sucralose, and cyclamate. LABELING TERMS REGULATED BY THE FDA Free.  The product contains only a tiny or small amount of fat, cholesterol, sodium, sugar, or calories. For example, a "fat-free" product will contain less than 0.5 g of fat per serving. Low.  A food described as "low" in fat, saturated fat, cholesterol,  sodium, or calories could be eaten fairly often without exceeding dietary guidelines. For example, "low in fat" means no more than 3 g of fat per serving. Lean.  "Lean" and "extra lean" are U.S. Department of Agriculture Scientist, research (physical sciences)) terms for use on meat and poultry products. "Lean" means the product contains less than 10 g of fat, 4 g of saturated fat, and 95 mg of cholesterol per serving. "Lean" is not as low in fat as a product labeled "low." Extra Lean.  "Extra lean" means the product contains less than 5 g of fat, 2 g of saturated fat, and 95 mg of cholesterol per serving. While "extra lean" has less fat than "lean," it is still higher in fat than a product labeled "low." Reduced, Less, Fewer.  A diet product that contains 25% less of a nutrient or calories than the regular version. For example, hot dogs might be labeled "25% less fat than our regular hot dogs." Light/Lite.  A diet product that contains  fewer calories or  the fat of the original. For example, "light in sodium" means a product with  the usual sodium. More.  One serving contains at least 10% more of the daily value of a vitamin, mineral, or fiber than usual. Good Source Of.  One serving contains 10% to 19% of the daily value for a particular vitamin, mineral, or fiber. Excellent Source Of.  One serving contains 20% or more of the daily value for a particular nutrient. Other terms used might be "high in" or "rich in." Enriched or Fortified.  The product contains added vitamins, minerals, or protein. Nutrition labeling must be used on enriched or fortified foods. Imitation.  The product has been altered so that it is lower in protein, vitamins, or minerals than the usual food,such as imitation peanut butter. Total Fat.  The number listed is the total of all fat found in a serving of the product. Under total fat, food labels must list saturated fat and trans fat, which are associated with raising bad cholesterol and an  increased risk of heart blood vessel disease. Saturated Fat.  Mainly fats from animal-based sources. Some examples are red meat, cheese, cream, whole milk, and coconut oil. Trans Fat.  Found in some fried snack foods, packaged foods, and fried restaurant foods. It is recommended you eat as close to 0 g of trans fat as possible,  since it raises bad cholesterol and lowers good cholesterol. Polyunsaturated and Monounsaturated Fats.  More healthful fats. These fats are from plant sources. Total Carbohydrate.  The number of carbohydrate grams in a serving of the product. Under total carbohydrate are listed the other carbohydrate sources, such as dietary fiber and sugars. Dietary Fiber.  A carbohydrate from plant sources. Sugars.  Sugars listed on the label contain all naturally occurring sugars as well as added sugars. LABELING TERMS NOT REGULATED BY THE FDA Sugarless.  Table sugar (sucrose) has not been added. However, the manufacturer may use another form of sugar in place of sucrose to sweeten the product. For example, sugar alcohols are used to sweeten foods. Sugar alcohols are a form of sugar but are not table sugar. If a product contains sugar alcohols in place of sucrose, it can still be labeled "sugarless." Low Salt, Salt-Free, Unsalted, No Salt, No Salt Added, Without Added Salt.  Food that is usually processed with salt has been made without salt. However, the food may contain sodium-containing additives, such as preservatives, leavening agents, or flavorings. Natural.  This term has no legal meaning. Organic.  Foods that are certified as organic have been inspected and approved by the USDA to ensure they are produced without pesticides, fertilizers containing synthetic ingredients, bioengineering, or ionizing radiation. Document Released: 11/18/2003 Document Revised: 02/07/2012 Document Reviewed: 06/05/2009 [facility] Patient Information 2014 [place], [place]. Type 2 Diabetes  [place], Adult Type 2 diabetes mellitus, often simply referred to as type 2 diabetes, is a long-lasting (chronic) disease. In type 2 diabetes, the pancreas does not make enough insulin (a hormone), the cells are less responsive to the insulin that is made (insulin resistance), or both. Normally, insulin moves sugars from food into the tissue cells. The tissue cells use the sugars for energy. The lack of insulin or the lack of normal response to insulin causes excess sugars to build up in the blood instead of going into the tissue cells. As a result, high blood sugar (hyperglycemia) develops. The effect of high sugar (glucose) levels can cause many complications. Type 2 diabetes was also previously called adult-onset diabetes but it can occur at any age.  RISK FACTORS  A person is predisposed to developing type 2 diabetes if someone in the family has the disease and also has one or more of the following primary risk factors:  Overweight.  An inactive lifestyle.  A history of consistently eating high-calorie foods. Maintaining a normal weight and regular physical activity can reduce the chance of developing type 2 diabetes. SYMPTOMS  A person with type 2 diabetes may not show symptoms initially. The symptoms of type 2 diabetes appear slowly. The symptoms include:  Increased thirst (polydipsia).  Increased urination (polyuria).  Increased urination during the night (nocturia).  Weight loss. This weight loss may be rapid.  Frequent, recurring infections.  Tiredness (fatigue).  Weakness.  Vision changes, such as blurred vision.  Fruity smell to your breath.  Abdominal pain.  Nausea or vomiting.  Cuts or bruises which are slow to heal.  Tingling or numbness in the hands or feet. DIAGNOSIS Type 2 diabetes is frequently not diagnosed until complications of diabetes are present. Type 2 diabetes is diagnosed when symptoms or complications are present and when blood glucose levels  are increased. Your blood glucose level may be checked by one or more of the following blood tests:  A fasting blood glucose test. You will not be allowed to eat for at least 8 hours before a  blood sample is taken.  A random blood glucose test. Your blood glucose is checked at any time of the day regardless of when you ate.  A hemoglobin A1c blood glucose test. A hemoglobin A1c test provides information about blood glucose control over the previous 3 months.  An oral glucose tolerance test (OGTT). Your blood glucose is measured after you have not eaten (fasted) for 2 hours and then after you drink a glucose-containing beverage. TREATMENT   You may need to take insulin or diabetes medicine daily to keep blood glucose levels in the desired range.  You will need to match insulin dosing with exercise and healthy food choices. The treatment goal is to maintain the before meal blood sugar (preprandial glucose) level at 70 130 mg/dL. HOME CARE INSTRUCTIONS   Have your hemoglobin A1c level checked twice a year.  Perform daily blood glucose monitoring as directed by your caregiver.  Monitor urine ketones when you are ill and as directed by your caregiver.  Take your diabetes medicine or insulin as directed by your caregiver to maintain your blood glucose levels in the desired range.  Never run out of diabetes medicine or insulin. It is needed every day.  Adjust insulin based on your intake of carbohydrates. Carbohydrates can raise blood glucose levels but need to be included in your diet. Carbohydrates provide vitamins, minerals, and fiber which are an essential part of a healthy diet. Carbohydrates are found in fruits, vegetables, whole grains, dairy products, legumes, and foods containing added sugars.    Eat healthy foods. Alternate 3 meals with 3 snacks.  Lose weight if overweight.  Carry a medical alert card or wear your medical alert jewelry.  Carry a 15 gram carbohydrate snack with  you at all times to treat low blood glucose (hypoglycemia). Some examples of 15 gram carbohydrate snacks include:  Glucose tablets, 3 or 4   Glucose gel, 15 gram tube  Raisins, 2 tablespoons (24 grams)  Jelly beans, 6  Animal crackers, 8  Regular pop, 4 ounces (120 mL)  Gummy treats, 9  Recognize hypoglycemia. Hypoglycemia occurs with blood glucose levels of 70 mg/dL and below. The risk for hypoglycemia increases when fasting or skipping meals, during or after intense exercise, and during sleep. Hypoglycemia symptoms can include:  Tremors or shakes.  Decreased ability to concentrate.  Sweating.  Increased heart rate.  Headache.  Dry mouth.  Hunger.  Irritability.  Anxiety.  Restless sleep.  Altered speech or coordination.  Confusion.  Treat hypoglycemia promptly. If you are alert and able to safely swallow, follow the 15:15 rule:  Take 15 20 grams of rapid-acting glucose or carbohydrate. Rapid-acting options include glucose gel, glucose tablets, or 4 ounces (120 mL) of fruit juice, regular soda, or low fat milk.  Check your blood glucose level 15 minutes after taking the glucose.  Take 15 20 grams more of glucose if the repeat blood glucose level is still 70 mg/dL or below.  Eat a meal or snack within 1 hour once blood glucose levels return to normal.    Be alert to polyuria and polydipsia which are early signs of hyperglycemia. An early awareness of hyperglycemia allows for prompt treatment. Treat hyperglycemia as directed by your caregiver.  Engage in at least 150 minutes of moderate-intensity physical activity a week, spread over at least 3 days of the week or as directed by your caregiver. In addition, you should engage in resistance exercise at least 2 times a week or as directed by your  caregiver.  Adjust your medicine and food intake as needed if you start a new exercise or sport.  Follow your sick day plan at any time you are unable to eat or  drink as usual.  Avoid tobacco use.  Limit alcohol intake to no more than 1 drink per day for nonpregnant women and 2 drinks per day for men. You should drink alcohol only when you are also eating food. Talk with your caregiver whether alcohol is safe for you. Tell your caregiver if you drink alcohol several times a week.  Follow up with your caregiver regularly.  Schedule an eye exam soon after the diagnosis of type 2 diabetes and then annually.  Perform daily skin and foot care. Examine your skin and feet daily for cuts, bruises, redness, nail problems, bleeding, blisters, or sores. A foot exam by a caregiver should be done annually.  Brush your teeth and gums at least twice a day and floss at least once a day. Follow up with your dentist regularly.  Share your diabetes management plan with your workplace or school.  Stay up-to-date with immunizations.  Learn to manage stress.  Obtain ongoing diabetes education and support as needed.  Participate in, or seek rehabilitation as needed to maintain or improve independence and quality of life. Request a physical or occupational therapy referral if you are having foot or hand numbness or difficulties with grooming, dressing, eating, or physical activity. SEEK MEDICAL CARE IF:   You are unable to eat food or drink fluids for more than 6 hours.  You have nausea and vomiting for more than 6 hours.  Your blood glucose level is over 240 mg/dL.  There is a change in mental status.  You develop an additional serious illness.  You have diarrhea for more than 6 hours.  You have been sick or have had a fever for a couple of days and are not getting better.  You have pain during any physical activity.  SEEK IMMEDIATE MEDICAL CARE IF:  You have difficulty breathing.  You have moderate to large ketone levels. MAKE SURE YOU:  Understand these instructions.  Will watch your condition.  Will get help right away if you are not doing  well or get worse. Document Released: 11/15/2005 Document Revised: 08/09/2012 Document Reviewed: 06/13/2012 [facility] Patient Information 2014 [place].

## 2014-04-26 NOTE — Progress Notes (Signed)
Subjective:    Patient ID: [name], female    DOB: Jun 02, 1941, 73 y.o.   MRN: [id number]  HPI  a 73 year old patient seen today following a recent hospital discharge for presyncope.  She was noted to have a small right parietal stroke.  He was also noted to have new onset diabetes and now is on low-dose Amaryl.  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 ANEMIA 08/28/2010  . CAD (coronary artery disease)     a. s/p CABGx4 in 2001.  Marland Kitchen COLONIC POLYPS, HX OF 06/15/2007  . COPD (chronic obstructive pulmonary disease)   . Depressive disorder   . GERD 12/19/2009  . Hyperlipidemia   . Hypertension   . HYPOTENSION 08/11/2010  . Osteoarthritis   . PVD (peripheral vascular disease)     a. Duplex 03/2013: stable moderate carotid dz - 58-09% RICA, 98-33% LICA, L subclavian artery to CCA stent widely patent.  b. Prior R fempop bypass graft, R CIA stent.  . Lumbar stenosis   . Lung abscess 2011  . CHF (congestive heart failure)   . Hx of colonoscopy   . Paroxysmal atrial flutter     a. Dx 01/2014, placed on Xarelto.  . Aortic stenosis     a. Mild by echo 01/2013.    History   Social History  . Marital Status: Widowed    Spouse Name: N/A    Number of Children: 2  . Years of Education: N/A   Occupational History  .     Social History Main Topics  . Smoking status: Former Smoker -- 1.50 packs/day for 40 years    Types: Cigarettes    Quit date: 06/29/2000  . Smokeless tobacco: Never Used  . Alcohol Use: No  . Drug Use: No  . Sexual Activity: Not on file   Other  Topics Concern  . Not on file   Social History Narrative  . No narrative on file    Past Surgical History  Procedure Laterality Date  . L subclavian bypass  07/2004  . Tubal ligation    . Coronary artery bypass graft  05/2000  . Femoral popliteal bypass-r  04/1999  . Right common iliac pta with stent placement  07/2000  . Right leg blockage  2003  . Aorta bifemoral bypass graft  2004  . Left cea  2005  . Lung cyst biopsy  2011    Family History  Problem Relation Age of Onset  . Diabetes Father   . Heart attack Mother   . Diabetes    . Coronary artery disease    . Colon cancer Neg Hx     Allergies  Allergen Reactions  . Codeine Phosphate     REACTION: unspecified    Current Outpatient Prescriptions on File Prior to Visit  Medication Sig Dispense Refill  . amiodarone (PACERONE) 400 MG tablet Take 1 tablet (400 mg total) by mouth daily.  30 tablet  0  . atorvastatin (LIPITOR) 40 MG tablet Take 1 tablet (40 mg total) by mouth every evening.  30 tablet  6  . cilostazol (PLETAL) 100 MG tablet  Take 100 mg by mouth 2 (two) times daily.      Marland Kitchen diltiazem (CARDIZEM CD) 120 MG 24 hr capsule Take 1 capsule by mouth daily.      . furosemide (LASIX) 40 MG tablet Take 0.5 tablets (20 mg total) by mouth daily.  90 tablet  6  . gabapentin (NEURONTIN) 600 MG tablet Take 1 tablet (600 mg total) by mouth 3 (three) times daily.  270 tablet  3  . glimepiride (AMARYL) 1 MG tablet Take 1 tablet (1 mg total) by mouth daily with breakfast.  30 tablet  0  . HYDROcodone-acetaminophen (NORCO/VICODIN) 5-325 MG per tablet Take 0.5 tablets by mouth every 6 (six) hours as needed for moderate pain.  90 tablet  0  . LORazepam (ATIVAN) 0.5 MG tablet Take 0.5 mg by mouth 2 (two) times daily as needed for anxiety.      Marland Kitchen losartan (COZAAR) 25 MG tablet Take 25 mg by mouth daily.      . metoprolol (LOPRESSOR) 50 MG tablet Take 25 mg by mouth 2 (two) times daily.       . Multiple Vitamins-Minerals (CENTRUM SILVER  ULTRA WOMENS PO) Take 1 capsule by mouth daily.       . potassium chloride (K-DUR) 10 MEQ tablet Take 1 tablet (10 mEq total) by mouth daily.  30 tablet  6  . promethazine (PHENERGAN) 25 MG tablet Take 1 tablet (25 mg total) by mouth every 6 (six) hours as needed for nausea or vomiting.  20 tablet  2  . rivaroxaban (XARELTO) 20 MG TABS tablet Take 1 tablet (20 mg total) by mouth daily with supper.  30 tablet  6  . sertraline (ZOLOFT) 50 MG tablet Take 50 mg by mouth daily.       Marland Kitchen tiotropium (SPIRIVA) 18 MCG inhalation capsule Place 1 capsule (18 mcg total) into inhaler and inhale daily.  30 capsule  0   No current facility-administered medications on file prior to visit.    BP 130/62  Pulse 46  Temp(Src) 98.3 F (36.8 C) (Oral)  Resp 18  Ht 5' 2.5" (1.588 m)  Wt 154 lb (69.854 kg)  BMI 27.70 kg/m2     Review of Systems  HENT: Negative for congestion, dental problem, hearing loss, rhinorrhea, sinus pressure, sore throat and tinnitus.   Eyes: Negative for pain, discharge and visual disturbance.  Respiratory: Negative for cough and shortness of breath.   Cardiovascular: Negative for chest pain, palpitations and leg swelling.  Gastrointestinal: Negative for nausea, vomiting, abdominal pain, diarrhea, constipation, blood in stool and abdominal distention.  Genitourinary: Negative for dysuria, urgency, frequency, hematuria, flank pain, vaginal bleeding, vaginal discharge, difficulty urinating, vaginal pain and pelvic pain.  Musculoskeletal: Negative for arthralgias, gait problem and joint swelling.  Skin: Negative for rash.  Neurological: Positive for dizziness and weakness. Negative for syncope, speech difficulty, numbness and headaches.  Hematological: Negative for adenopathy.  Psychiatric/Behavioral: Negative for behavioral problems, dysphoric mood and agitation. The patient is not nervous/anxious.        Objective:   Physical Exam  Constitutional: She is oriented to person,  place, and time. She appears well-developed and well-nourished.  Blood pressure 140/60 left arm Blood pressure 80/60 right arm  HENT:  Head: Normocephalic.  Right Ear: External ear normal.  Left Ear: External ear normal.  Mouth/Throat: Oropharynx is clear and moist.  Eyes: Conjunctivae and EOM are normal. Pupils are equal, round, and reactive to light.  Neck: Normal range of motion. Neck supple.  No thyromegaly present.  Bruits in the carotid and supraclavicular distributions  Cardiovascular: Normal rate and regular rhythm.   Murmur heard. Grade 3 over 6 harsh systolic murmur at the base  Pulmonary/Chest: Effort normal and breath sounds normal.  Abdominal: Soft. Bowel sounds are normal. She exhibits no mass. There is no tenderness.  Musculoskeletal: Normal range of motion.  Lymphadenopathy:    She has no cervical adenopathy.  Neurological: She is alert and oriented to person, place, and time.  Skin: Skin is warm and dry. No rash noted.  Psychiatric: She has a normal mood and affect. Her behavior is normal.          Assessment & Plan:   History of presyncope New-onset diabetes.  Will switch to metformin and discontinue Amaryl Diabetic teaching.  Ordered We'll initiate home blood sugar monitoring Diabetic instructions dispensed PAD Hypertension

## 2014-04-26 NOTE — Progress Notes (Signed)
Pre-visit discussion using our clinic review tool. No additional management support is needed unless otherwise documented below in the visit note.  

## 2014-04-29 ENCOUNTER — Encounter: Payer: Self-pay | Admitting: Cardiology

## 2014-04-29 ENCOUNTER — Ambulatory Visit (INDEPENDENT_AMBULATORY_CARE_PROVIDER_SITE_OTHER): Payer: Medicare Other | Admitting: Cardiology

## 2014-04-29 ENCOUNTER — Encounter: Payer: Self-pay | Admitting: *Deleted

## 2014-04-29 VITALS — BP 122/50 | HR 68 | Ht 62.5 in | Wt 154.0 lb

## 2014-04-29 DIAGNOSIS — G43909 Migraine, unspecified, not intractable, without status migrainosus: Secondary | ICD-10-CM

## 2014-04-29 NOTE — Progress Notes (Signed)
Patient ID: [name], female   DOB: 1941-01-15, 73 y.o.   MRN: [id number]     Patient Name: [name] Date of Encounter: 04/29/2014  Primary Care Provider:  Nyoka Cowden, MD Primary Cardiologist:  Dorothy Spark  Problem List   Past Medical History  Diagnosis Date  . ANEMIA 08/28/2010  . CAD (coronary artery disease)     a. s/p CABGx4 in 2001.  Marland Kitchen COLONIC POLYPS, HX OF 06/15/2007  . COPD (chronic obstructive pulmonary disease)   . Depressive disorder   . GERD 12/19/2009  . Hyperlipidemia   . Hypertension   . HYPOTENSION 08/11/2010  . Osteoarthritis   . PVD (peripheral vascular disease)     a. Duplex 03/2013: stable moderate carotid dz - 99-24% RICA, 26-83% LICA, L subclavian artery to CCA stent widely patent.  b. Prior R fempop bypass graft, R CIA stent.  . Lumbar stenosis   . Lung abscess 2011  . CHF (congestive heart failure)   . Hx of colonoscopy   . Paroxysmal atrial flutter     a. Dx 01/2014, placed on Xarelto.  . Aortic stenosis     a. Mild by echo 01/2013.   Past Surgical History  Procedure Laterality Date  . L subclavian bypass  07/2004  . Tubal ligation    . Coronary artery bypass graft  05/2000  . Femoral popliteal bypass-r  04/1999  . Right common iliac pta with stent placement  07/2000  . Right leg blockage  2003  . Aorta bifemoral bypass graft  2004  . Left cea  2005  . Lung cyst biopsy  2011   Allergies  Allergies  Allergen Reactions  . Codeine Phosphate     REACTION: unspecified   HPI  [name] is a 73 year old female with h/o CAD, CABG in 2001 and carotid disease who was previously followed by Dr Verl Blalock. She was doing really well since the CABG until March of this year when she presented with paroxymal  dizziness and weakness. She also complained of exertional shortness of breath and fatigue. She denied chest pain, orthopnea paroxysmal nocturnal dyspnea or lower extremity edema. She denies any syncope. She was diagnosed with  SVT, possibly 2:1 flutter with ventricular rate 160 BPM (ECG on 02/19/2014). She was admitted to the hospital the next day for TEE/CV but was found to be in SR already.  She underwent a Lexiscan nuclear stress test that showed anterior ischemia. The patient had a cardiac cath that showed 3 VD with the SVG to the OM and SVG to the RCA widely patent. Because of known left subclavian occlusion ( s/p left subclavian to carotid bypass) an attempt to access the IMA graft from a right radial approach was tried but was unsuccessful. The artery was small on ultrasound. The patient is scheduled for a CTA to confirm IMA patency and assess functionality of the left carotid to subclavian bypass graft.  Left ventricular systolic function is normal, LVEF is estimated at 60 - 65%, there is no significant mitral regurgitation.  Meanwhile she was wearing e-cardio monitor, she was found to have at least 6 episodes of SVT, some of then regular, some irregular with HR 160 - 190 BPM. The patient denies palpitations of syncope, however she felt profound fatigue.  The patient was admitted on 04/09/2014 after an episode of presyncope and almost daily episodes of migraines. The patient describes these as retro-ocular or involving right side of her head.  Upon admission patient had a  CT scan of head which showed an acute infarct, right parietal lobe. Patient had been on xarelto and aspirin prior to admission and these were continued . Neurology was consulted and they saw the patient and recommended to continue Xarelto (does not need to be on aspirin in addition per Dr. Leonie Man)  - It was noted that patient had had carotid Doppler as done on 5/7 per Dr. Meda Coffee and that this had showed an abnormality>> vascular surgery was consulted and CT angiogram of the neck was done and showed high-grade complex plaque involving the proximal innominate artery with extensive calcification and irregularity estimated at least 90% stenosis, and  estimated 75-80% calcific stenosis of left internal carotid artery origin noted. Dr. Kellie Simmering saw the patient and recommended no surgical intervention for the innominate stenosis. Regarding the left ICA stenosis he recommended outpatient followup in 6 months carotid Doppler to be repeated at that time in his office.   -A1c was done to complete stroke workup and was elevated at 6.9, patient's sugars were monitored and she was covered with sliding scale insulin. She'll be discharged on Amaryl and is to followup with her PCP for continued monitoring and dose adjustment as clinically appropriate.   The patient saw Dr. Lovena Le for management of paroxysmal atrial fibrillation and atrial flutter. He felt that ablation for atrial flutter wouldn't help as she also has episodes of atrial fibrillation her e- cardio monitor. He recommended to continue amiodarone by mouth.  Home Medications  Prior to Admission medications   Medication Sig Start Date End Date Taking? Authorizing Provider  acetaminophen (TYLENOL) 325 MG tablet Take 650 mg by mouth every 6 (six) hours as needed.      Historical Provider, MD  albuterol (PROVENTIL HFA;VENTOLIN HFA) 108 (90 BASE) MCG/ACT inhaler Inhale 2 puffs into the lungs every 6 (six) hours as needed for wheezing or shortness of breath.    Historical Provider, MD  atorvastatin (LIPITOR) 40 MG tablet Take 1 tablet (40 mg total) by mouth every evening. 02/21/14   Dayna N Dunn, PA-C  cilostazol (PLETAL) 100 MG tablet Take 100 mg by mouth 2 (two) times daily.    Historical Provider, MD  cilostazol (PLETAL) 100 MG tablet TAKE 1 TABLET BY MOUTH 2 TIMES A DAY 02/27/14   Marletta Lor, MD  diltiazem (CARDIZEM CD) 120 MG 24 hr capsule Take 1 capsule (120 mg total) by mouth daily.    Dorothy Spark, MD  gabapentin (NEURONTIN) 600 MG tablet Take 1 tablet (600 mg total) by mouth 3 (three) times daily. 07/23/13   Marletta Lor, MD  hydrochlorothiazide (HYDRODIURIL) 25 MG tablet Take 25  mg by mouth daily.  09/03/13   Historical Provider, MD  HYDROcodone-acetaminophen (NORCO/VICODIN) 5-325 MG per tablet Take 1 tablet by mouth every 6 (six) hours as needed for moderate pain.    Historical Provider, MD  LORazepam (ATIVAN) 0.5 MG tablet Take 0.5 mg by mouth 2 (two) times daily as needed for anxiety.    Historical Provider, MD  losartan (COZAAR) 25 MG tablet Take 25 mg by mouth daily.    Historical Provider, MD  metoprolol tartrate (LOPRESSOR) 25 MG tablet Take 2 tablets (50 mg total) by mouth 2 (two) times daily. 03/12/14   Dorothy Spark, MD  nitroGLYCERIN (NITROSTAT) 0.4 MG SL tablet Place 0.4 mg under the tongue every 5 (five) minutes as needed. 08/13/11   Renella Cunas, MD  omeprazole (PRILOSEC) 20 MG capsule Take 20 mg by mouth daily.  Historical Provider, MD  potassium chloride (K-DUR) 10 MEQ tablet Take 1 tablet (10 mEq total) by mouth daily. 03/12/14   Peter M Martinique, MD  promethazine (PHENERGAN) 25 MG tablet Take 25 mg by mouth every 6 (six) hours as needed for nausea or vomiting.    Historical Provider, MD  rivaroxaban (XARELTO) 20 MG TABS tablet Take 1 tablet (20 mg total) by mouth daily with supper. 03/21/14   Dorothy Spark, MD  sertraline (ZOLOFT) 50 MG tablet Take 25 mg by mouth daily.    Historical Provider, MD  therapeutic multivitamin-minerals (THERAGRAN-M) tablet Take 1 tablet by mouth daily.    Historical Provider, MD  tiotropium (SPIRIVA HANDIHALER) 18 MCG inhalation capsule Place 1 capsule (18 mcg total) into inhaler and inhale daily. 03/18/14 03/18/15  Elsie Stain, MD  trolamine salicylate (ASPERCREME) 10 % cream Apply 1 application topically as needed for muscle pain.    Historical Provider, MD    Family History  Family History  Problem Relation Age of Onset  . Diabetes Father   . Heart attack Mother   . Diabetes    . Coronary artery disease    . Colon cancer Neg Hx     Social History  History   Social History  . Marital Status: Widowed     Spouse Name: N/A    Number of Children: 2  . Years of Education: N/A   Occupational History  .     Social History Main Topics  . Smoking status: Former Smoker -- 1.50 packs/day for 40 years    Types: Cigarettes    Quit date: 06/29/2000  . Smokeless tobacco: Never Used  . Alcohol Use: No  . Drug Use: No  . Sexual Activity: Not on file   Other Topics Concern  . Not on file   Social History Narrative  . No narrative on file     Review of Systems, as per HPI, otherwise negative General:  No chills, fever, night sweats or weight changes.  Cardiovascular:  No chest pain, dyspnea on exertion, edema, orthopnea, palpitations, paroxysmal nocturnal dyspnea. Dermatological: No rash, lesions/masses Respiratory: No cough, dyspnea Urologic: No hematuria, dysuria Abdominal:   No nausea, vomiting, diarrhea, bright red blood per rectum, melena, or hematemesis Neurologic:  No visual changes, wkns, changes in mental status. All other systems reviewed and are otherwise negative except as noted above.  Physical Exam  Blood pressure 122/50, pulse 68, height 5' 2.5" (1.588 m), weight 154 lb (69.854 kg).  General: Pleasant, NAD Psych: Normal affect. Neuro: Alert and oriented X 3. Moves all extremities spontaneously. HEENT: Normal  Neck: Supple without bruits or JVD. Lungs:  Resp regular and unlabored, CTA. Heart: RRR no s3, s4, or murmurs. Abdomen: Soft, non-tender, non-distended, BS + x 4.  Extremities: No clubbing, cyanosis or edema. DP/PT/Radials 2+ and equal bilaterally.  Labs:  No results found for this basename: CKTOTAL, CKMB, TROPONINI,  in the last 72 hours Lab Results  Component Value Date   WBC 7.3 04/26/2014   HGB 11.0* 04/26/2014   HCT 33.7* 04/26/2014   MCV 82.5 04/26/2014   PLT 248.0 04/26/2014       Component Value Date/Time   NA 145 04/13/2014 0417   K 4.2 04/13/2014 0417   CL 106 04/13/2014 0417   CO2 25 04/13/2014 0417   GLUCOSE 96 04/13/2014 0417   GLUCOSE 91 10/31/2006  0948   BUN 17 04/13/2014 0417   CREATININE 1.05 04/13/2014 0417   CALCIUM 9.6 04/13/2014 0417  CALCIUM 10.8* 04/09/2014 2101   PROT 6.2 04/13/2014 0417   ALBUMIN 3.1* 04/13/2014 0417   AST 17 04/13/2014 0417   ALT 11 04/13/2014 0417   ALKPHOS 68 04/13/2014 0417   BILITOT 0.5 04/13/2014 0417   GFRNONAA 52* 04/13/2014 0417   GFRAA 60* 04/13/2014 0417   Lab Results  Component Value Date   CHOL 118 04/11/2014   HDL 46 04/11/2014   LDLCALC 45 04/11/2014   TRIG 137 04/11/2014    Accessory Clinical Findings  Echocardiogram 01/2013  - Left ventricle: The cavity size was normal. Wall thickness was increased in a pattern of mild LVH. Systolic function was normal. The estimated ejection fraction was in the range of 55% to 60%. Wall motion was normal; there were no regional wall motion abnormalities. Doppler parameters are consistent with abnormal left ventricular relaxation (grade 1 diastolic dysfunction). - Aortic valve: There was mild stenosis. Mean gradient: 2mm Hg (S). Peak gradient: 62mm Hg (S). - Mitral valve: Calcified annulus. - Left atrium: The atrium was mildly dilated. - Pulmonary arteries: Systolic pressure was mildly increased. PA peak pressure: 70mm Hg (S). Impressions: - Calcified aortic valve with mildly reduced cusp excursion; mild AS by doppler. There is note of elevated gradient from suprasternal notch most likely from right innominate artery (5 m/s); suggest dopplers to further assess.  ECG - sinus rhythm with PACs, 77 beats per minute otherwise normal EKG.   Cardiac catheterization : 03/11/2014  Coronary angiography:  Coronary dominance: right  Left mainstem: 100% occlusion at the ostium  Left anterior descending (LAD): Not visualized.  Left circumflex (LCx): Not visualized  Right coronary artery (RCA): 100% in the mid vessel.  The SVG to the OM is widely patent.  The SVG to the RCA is widely patent. There are some collaterals to the first septal perforator from the  RCA.   Left ventriculography: Left ventricular systolic function is normal, LVEF is estimated at 60 - 65%, there is no significant mitral regurgitation   Given her known left subclavian occlusion we next attempted to access the IMA graft from a right radial approach but unsuccessful. The artery was small on ultrasound.  Recommendations: Medical management. I would consider CTA to confirm IMA patency and assess functionality of the left carotid to subclavian bypass graft.     Assessment & Plan  73 year old female with paroxysmal SVT and DOE, abnormal stress test  1. CAD, s/p CABG in 2001, anterior ischemia on Lexiscan stress test on 03/07/14, cath with 3 occluded vessels, patient SVG to OM and RCA, LIMA not images as chronic left subclavian occlusion, s/p subclavian to carotid bypass and small right radial vessel. CTA to assess LIMA patency scheduled for 04/02/14. However this gets canceled because of scanner problem. Afterwards patient was admitted with an acute stroke. Since the patient has been going through a lot lately dealing with acute stroke, paroxysmal atrial fibrillation and atrial flutter and she is not truly symptomatic with chest pain, for now we will treat medically.  2. paroxysmal a-fib and a-flutter with 2:1 block, ventricular rate 160-190 BPM. Currently on amiodarone into sinus rhythm no recurrent episodes of palpitations. Continue Metoprolol, diltiazem and xarelto.  3. PAD - carotid disease - vascular surgery was consulted and CT angiogram of the neck was done and showed high-grade complex plaque involving the proximal innominate artery with extensive calcification and irregularity estimated at least 90% stenosis, and estimated 75-80% calcific stenosis of left internal carotid artery origin noted. Dr. Hart Rochester saw the patient  and recommended no surgical intervention for the innominate stenosis. Regarding the left ICA stenosis he recommended outpatient followup in 6 months carotid Doppler  to be repeated at that time in his office.  She is informed that if her symptoms deteriorate we will refer to vascular surgery rather earlier than later.  S/P right fem-pop BP, no claudications, on cilostazol.  4. HTN - controlled  Followup in 2 months. We'll provide additional letter to she can return to work  Dorothy Spark, MD, [facility] 04/29/2014, 12:02 PM

## 2014-04-29 NOTE — Patient Instructions (Signed)
Your physician recommends that you continue on your current medications as directed. Please refer to the Current Medication list given to you today.  Your physician recommends that you schedule a follow-up appointment in: 2 month ov   Will arrange for you to see a Neurologist

## 2014-05-09 ENCOUNTER — Other Ambulatory Visit: Payer: Self-pay | Admitting: Internal Medicine

## 2014-05-15 ENCOUNTER — Telehealth: Payer: Self-pay | Admitting: Cardiology

## 2014-05-15 NOTE — Telephone Encounter (Signed)
Patient has ? About work note. Please call and advise.

## 2014-05-15 NOTE — Telephone Encounter (Signed)
LMTCB

## 2014-05-16 ENCOUNTER — Telehealth: Payer: Self-pay | Admitting: *Deleted

## 2014-05-16 NOTE — Telephone Encounter (Signed)
Pt calling to ask if possibly in the near future she could be taken off a 5 lb lift restriction, because her boss is giving her a hard time.  Advised pt to ask her employer exactly what kind of restriction can she have to keep her job and be safe.  After pt asks employer this, then I told her I will further update Dr Meda Coffee on this so she can evaluate and recommend restrictions.  Pt verbalized understanding and agrees with this plan.

## 2014-05-16 NOTE — Telephone Encounter (Signed)
Patient is returning your call. Please call back.  °

## 2014-05-16 NOTE — Telephone Encounter (Signed)
Pt calling wanting to know if Dr Meda Coffee would  lift her 5 lb weight restriction to 10-15 lbs.  Letter was written at last OV.  Pts employer, is giving her a hard time with the 5 lb weight restriction.  Pt only wants this restriction lifted if its safe.  Advised pt that I will route this message to Dr Meda Coffee for further review and recommendation.  Pt verbalized understanding and agrees with this plan.

## 2014-05-17 NOTE — Telephone Encounter (Signed)
[name], Would you write it for her? Thank you, K

## 2014-05-17 NOTE — Telephone Encounter (Signed)
Contacted pt and she states that she really wants to stick with the 5 lb restriction.  Pt states she's pretty sure she is going to quit her current job.  Pt states if she needs [place] to write a letter in the future, she will call and let us know.  Pt very grateful for all the assistance provided.

## 2014-05-20 ENCOUNTER — Other Ambulatory Visit: Payer: Self-pay | Admitting: Critical Care Medicine

## 2014-05-23 ENCOUNTER — Other Ambulatory Visit: Payer: Self-pay

## 2014-05-23 MED ORDER — ATORVASTATIN CALCIUM 40 MG PO TABS: 40.0000 mg | ORAL_TABLET | Freq: Every evening | ORAL | Status: DC

## 2014-05-27 ENCOUNTER — Ambulatory Visit (INDEPENDENT_AMBULATORY_CARE_PROVIDER_SITE_OTHER): Payer: Medicare Other | Admitting: Neurology

## 2014-05-27 ENCOUNTER — Telehealth: Payer: Self-pay | Admitting: Neurology

## 2014-05-27 ENCOUNTER — Encounter: Payer: Self-pay | Admitting: Neurology

## 2014-05-27 VITALS — BP 140/68 | HR 65 | Ht 62.6 in | Wt 152.4 lb

## 2014-05-27 DIAGNOSIS — H93A1 Pulsatile tinnitus, right ear: Secondary | ICD-10-CM

## 2014-05-27 DIAGNOSIS — I739 Peripheral vascular disease, unspecified: Secondary | ICD-10-CM

## 2014-05-27 DIAGNOSIS — I6330 Cerebral infarction due to thrombosis of unspecified cerebral artery: Secondary | ICD-10-CM

## 2014-05-27 DIAGNOSIS — I779 Disorder of arteries and arterioles, unspecified: Secondary | ICD-10-CM

## 2014-05-27 DIAGNOSIS — H9319 Tinnitus, unspecified ear: Secondary | ICD-10-CM

## 2014-05-27 DIAGNOSIS — R51 Headache: Secondary | ICD-10-CM

## 2014-05-27 LAB — SEDIMENTATION RATE: Sed Rate: 12 mm/hr (ref 0–22)

## 2014-05-27 NOTE — Patient Instructions (Addendum)
1.  Start taking the gabapentin 600mg  three times daily around the clock for the headache. 2.  We will check blood work, specifically sed rate 3.  We will check CT scan of the arteries in the head. [facility] 05/29/14 10:45 am NPO after 6am 4 .  Please follow up with your eye doctor 5.  Follow up with [facility].

## 2014-05-27 NOTE — Telephone Encounter (Signed)
t has questions regarding location of imaging. Please call to clarify / Sherri [phone]

## 2014-05-27 NOTE — Progress Notes (Signed)
NEUROLOGY CONSULTATION NOTE  KHALESSI BLOUGH MRN: [id number] DOB: 1941-05-02  Referring provider: Ena Dawley, MD Primary care provider: Bluford Kaufmann, MD  Reason for consult:  Headache  HISTORY OF PRESENT ILLNESS: [name] is a 73 year old right-handed woman with history of CAD, CHF, COPD, paroxysmal atrial flutter (on Xarelto), peripheral vascular disease, hyperlipidemia, hypertension, lung abscess, GERD and depression who presents for headache.  She is accompanied by her daughter.  Records and images personally reviewed.  She was admitted to [facility] on 04/[facility]/14 for presyncope.  CT Head was unremarkable.  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  Hgb A1c was 6.9.  LDL was 45.  2D echo showed LVEF 50-55% and grade 2 diastolic dysfunction.  She had an episode of A. fib/fludder rapid ventricular response with associated hypotension.  Cardiology started amiodarone, while Cardizem was discontinued and metoprolol was decreased.  She had already previously had a carotid doppler performed on 04/04/14, which revealed 40-59% RICA stenosis and 81-01% LICA with patent vertebral arteries.  Vascular surgery ordered CTA of the neck, showing at least 90% high-grade complex plaque involving the proximal innominate artery with extensive calcification and irregularity in the right ICA, and 75-80% calcific stenosis of the left ICA.  Vascular surgery recommended no intervention for the right ICA.  Follow-up was recommended in 6 months to re-evaluate the left ICA.  She was continued on Xarelto and aspirin was discontinued.  For the new-onset diabetes, she was started on Amaryl.  Beginning in February, she noticed some visual disturbance in her right eye.  Specifically, she sees a small spot in the mid-top portion of the visual field in her right eye.  In April, she began having the dizzy spells.  At that time, she developed dull bi-frontal headache.  It is a 1/10 intensity.  She still  gets the headache about 2 times a month.  It lasts 3-4 hours.  It is treated with sleep or Tylenol.  Since her hospital admission, she has been also having right frontal headaches, also aching, but 5/10.  It is associated with photophobia.  It is preceded by a flash of light in her right eye.  It lasts 2 hours and occurs 2 to 3 times a week.  She typically takes Tylenol occasionally, which helps.  She has remote history of migraines.  She has not seen her eye doctor in a while.  She takes gabapentin 600mg  up to three times a day as needed for arthritic pain.  She also notes a wooshing sound in her right ear.  PAST MEDICAL HISTORY: Past Medical History  Diagnosis Date  . ANEMIA 08/28/2010  . CAD (coronary artery disease)     a. s/p CABGx4 in 2001.  Marland Kitchen COLONIC POLYPS, HX OF 06/15/2007  . COPD (chronic obstructive pulmonary disease)   . Depressive disorder   . GERD 12/19/2009  . Hyperlipidemia   . Hypertension   . HYPOTENSION 08/11/2010  . Osteoarthritis   . PVD (peripheral vascular disease)     a. Duplex 03/2013: stable moderate carotid dz - 75-10% RICA, 25-85% LICA, L subclavian artery to CCA stent widely patent.  b. Prior R fempop bypass graft, R CIA stent.  . Lumbar stenosis   . Lung abscess 2011  . CHF (congestive heart failure)   . Hx of colonoscopy   . Paroxysmal atrial flutter     a. Dx 01/2014, placed on Xarelto.  . Aortic stenosis     a. Mild by echo  01/2013.    PAST SURGICAL HISTORY: Past Surgical History  Procedure Laterality Date  . L subclavian bypass  07/2004  . Tubal ligation    . Coronary artery bypass graft  05/2000  . Femoral popliteal bypass-r  04/1999  . Right common iliac pta with stent placement  07/2000  . Right leg blockage  2003  . Aorta bifemoral bypass graft  2004  . Left cea  2005  . Lung cyst biopsy  2011    MEDICATIONS: Current Outpatient Prescriptions on File Prior to Visit  Medication Sig Dispense Refill  . ACCU-CHEK SOFTCLIX LANCETS lancets 1 each by  Other route daily as needed for other.  100 each  12  . amiodarone (PACERONE) 200 MG tablet TAKE 2 TABLET (400MG ) BY MOUTH DAILY  60 tablet  2  . amiodarone (PACERONE) 400 MG tablet Take 1 tablet (400 mg total) by mouth daily.  30 tablet  0  . atorvastatin (LIPITOR) 40 MG tablet Take 1 tablet (40 mg total) by mouth every evening.  90 tablet  1  . cilostazol (PLETAL) 100 MG tablet Take 100 mg by mouth 2 (two) times daily.      Marland Kitchen diltiazem (CARDIZEM CD) 120 MG 24 hr capsule Take 1 capsule by mouth daily.      . furosemide (LASIX) 40 MG tablet Take 0.5 tablets (20 mg total) by mouth daily.  90 tablet  6  . gabapentin (NEURONTIN) 600 MG tablet Take 1 tablet (600 mg total) by mouth 3 (three) times daily.  270 tablet  3  . glucose blood (ACCU-CHEK AVIVA PLUS) test strip 1 each by Other route daily as needed for other.  100 each  12  . HYDROcodone-acetaminophen (NORCO/VICODIN) 5-325 MG per tablet Take 0.5 tablets by mouth every 6 (six) hours as needed for moderate pain.  90 tablet  0  . LORazepam (ATIVAN) 0.5 MG tablet Take 1 tablet (0.5 mg total) by mouth 2 (two) times daily as needed for anxiety.  60 tablet  0  . losartan (COZAAR) 25 MG tablet TAKE 1 TABLET (25 MG TOTAL) BY MOUTH DAILY.  90 tablet  0  . metFORMIN (GLUCOPHAGE) 500 MG tablet Take 1 tablet (500 mg total) by mouth 2 (two) times daily with a meal.  180 tablet  3  . metoprolol (LOPRESSOR) 50 MG tablet Take 25 mg by mouth 2 (two) times daily.       . Multiple Vitamins-Minerals (CENTRUM SILVER ULTRA WOMENS PO) Take 1 capsule by mouth daily.       . potassium chloride (K-DUR) 10 MEQ tablet Take 1 tablet (10 mEq total) by mouth daily.  30 tablet  6  . promethazine (PHENERGAN) 25 MG tablet Take 1 tablet (25 mg total) by mouth every 6 (six) hours as needed for nausea or vomiting.  20 tablet  2  . rivaroxaban (XARELTO) 20 MG TABS tablet Take 1 tablet (20 mg total) by mouth daily with supper.  30 tablet  6  . sertraline (ZOLOFT) 50 MG tablet Take 50  mg by mouth daily.       Marland Kitchen tiotropium (SPIRIVA) 18 MCG inhalation capsule Place 1 capsule (18 mcg total) into inhaler and inhale daily.  30 capsule  0   No current facility-administered medications on file prior to visit.    ALLERGIES: Allergies  Allergen Reactions  . Codeine Phosphate     REACTION: unspecified    FAMILY HISTORY: Family History  Problem Relation Age of Onset  . Diabetes Father   .  Heart attack Mother   . Diabetes    . Coronary artery disease    . Colon cancer Neg Hx     SOCIAL HISTORY: History   Social History  . Marital Status: Widowed    Spouse Name: N/A    Number of Children: 2  . Years of Education: N/A   Occupational History  .     Social History Main Topics  . Smoking status: Former Smoker -- 1.50 packs/day for 40 years    Types: Cigarettes    Quit date: 06/29/2000  . Smokeless tobacco: Never Used  . Alcohol Use: No  . Drug Use: No  . Sexual Activity: Not on file   Other Topics Concern  . Not on file   Social History Narrative  . No narrative on file    REVIEW OF SYSTEMS: Constitutional: No fevers, chills, or sweats, no generalized fatigue, change in appetite Eyes: No visual changes, double vision, eye pain Ear, nose and throat: No hearing loss, ear pain, nasal congestion, sore throat Cardiovascular: No chest pain, palpitations Respiratory:  No shortness of breath at rest or with exertion, wheezes GastrointestinaI: No nausea, vomiting, diarrhea, abdominal pain, fecal incontinence Genitourinary:  No dysuria, urinary retention or frequency Musculoskeletal:  No neck pain, back pain Integumentary: No rash, pruritus, skin lesions Neurological: as above Psychiatric: No depression, insomnia, anxiety Endocrine: No palpitations, fatigue, diaphoresis, mood swings, change in appetite, change in weight, increased thirst Hematologic/Lymphatic:  No anemia, purpura, petechiae. Allergic/Immunologic: no itchy/runny eyes, nasal congestion, recent  allergic reactions, rashes  PHYSICAL EXAM: Filed Vitals:   05/27/14 1249  BP: 140/68  Pulse: 65   General: No acute distress Head:  Normocephalic/atraumatic Neck: supple, no paraspinal tenderness, full range of motion Back: No paraspinal tenderness Heart: regular rate and rhythm Lungs: Clear to auscultation bilaterally. Vascular: Bilateral carotid bruits. Neurological Exam: Mental status: alert and oriented to person, place, and time, recent and remote memory intact, fund of knowledge intact, attention and concentration intact, speech fluent and not dysarthric, language intact. Cranial nerves: CN I: not tested CN II: pupils equal, round and reactive to light, right monocular vision loss in the mid-top portion of the visual field, fundi not visualized CN III, IV, VI:  full range of motion, no nystagmus, no ptosis CN V: facial sensation intact CN VII: upper and lower face symmetric CN VIII: hearing intact CN IX, X: gag intact, uvula midline CN XI: sternocleidomastoid and trapezius muscles intact CN XII: tongue midline Bulk & Tone: normal, no fasciculations. Motor: 5/5 throughout Sensation: reduced temperature and vibration sensation in the toes. Deep Tendon Reflexes: 2+ throughout, toes downgoing. Finger to nose testing: no dysmetria Heel to shin: no dysmetria Gait: normal station and stride.  Able to turn and walk in tandem. Romberg negative.  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  PLAN: 1.  To rule out other cause of pulsatile tinnitus, will get CTA of head without contrast 2.  To further evaluate right sided headache with some visual disturbance, will check Sed Rate. 3.  Recommend following  up with Dr. Delman Cheadle, her ophthalmologist, for formal evaluation of her vision in the right eye. 4.  Start taking gabapentin 600mg  three times daily round the clock (not as needed) for headache prevention. 5.  Continue Xarelto  for secondary stroke prevention.  Optimize diabetes control.  Follow up with vascular surgery. 6.  Patient has stroke follow-up with [facility] in August.  Recommend continuation of care with them.  45 minutes spent with the patient, over 50% spent reviewing the MRI, discussing possible etiologies, and coordinating the plan.  Thank you for allowing me to take part in the care of this patient.  Metta Clines, DO  CC:   Ena Dawley, MD  Bluford Kaufmann, MD

## 2014-05-28 ENCOUNTER — Telehealth: Payer: Self-pay | Admitting: *Deleted

## 2014-05-28 NOTE — Telephone Encounter (Signed)
Message copied by Claudie Revering on Tue May 28, 2014  8:15 AM ------      Message from: JAFFE, ADAM R      Created: Tue May 28, 2014  7:32 AM       Sed Rate is normal.        ----- Message -----         From: Lab in [facility] Interface         Sent: 05/27/2014   8:09 PM           To: Dudley Major, DO                   ------

## 2014-05-28 NOTE — Telephone Encounter (Signed)
Patient is aware of normal labs  

## 2014-05-29 ENCOUNTER — Ambulatory Visit (HOSPITAL_COMMUNITY)
Admission: RE | Admit: 2014-05-29 | Discharge: 2014-05-29 | Disposition: A | Discharge status: Home / Self Care | Payer: Medicare Other | Source: Ambulatory Visit | Attending: Neurology | Admitting: Neurology

## 2014-05-29 DIAGNOSIS — I6330 Cerebral infarction due to thrombosis of unspecified cerebral artery: Secondary | ICD-10-CM | POA: Insufficient documentation

## 2014-05-29 DIAGNOSIS — H93A1 Pulsatile tinnitus, right ear: Secondary | ICD-10-CM

## 2014-05-29 DIAGNOSIS — H9319 Tinnitus, unspecified ear: Secondary | ICD-10-CM | POA: Insufficient documentation

## 2014-05-29 DIAGNOSIS — R51 Headache: Secondary | ICD-10-CM | POA: Insufficient documentation

## 2014-05-29 MED ORDER — IOHEXOL 350 MG/ML SOLN
100.0000 mL | Freq: Once | INTRAVENOUS | Status: AC | PRN
  Administered 2014-05-29: 100 mL via INTRAVENOUS

## 2014-05-30 ENCOUNTER — Telehealth: Payer: Self-pay | Admitting: *Deleted

## 2014-05-30 NOTE — Telephone Encounter (Signed)
Patient is aware of CTA results that are normal

## 2014-05-30 NOTE — Telephone Encounter (Signed)
Message copied by Claudie Revering on Thu May 30, 2014  8:36 AM ------      Message from: JAFFE, ADAM R      Created: Thu May 30, 2014  7:21 AM       CTA of the head does not show any specific cause for the pulsatile tinnitus.  Therefore, it is most likely due to the carotid artery disease.      ----- Message -----         From: Rad Results In Interface         Sent: 05/29/2014  11:58 AM           To: Dudley Major, DO                   ------

## 2014-06-04 ENCOUNTER — Encounter: Payer: Medicare Other | Attending: Internal Medicine

## 2014-06-04 VITALS — Ht 62.5 in | Wt 151.3 lb

## 2014-06-04 DIAGNOSIS — Z713 Dietary counseling and surveillance: Secondary | ICD-10-CM | POA: Diagnosis not present

## 2014-06-04 DIAGNOSIS — E119 Type 2 diabetes mellitus without complications: Secondary | ICD-10-CM | POA: Insufficient documentation

## 2014-06-04 NOTE — Progress Notes (Signed)
Patient was seen on 06/04/14 for the first of a series of three diabetes self-management courses at the [facility].  Current HbA1c: 6.6%  The following learning objectives were met by the patient during this class:  Describe diabetes  State some common risk factors for diabetes  Defines the role of glucose and insulin  Identifies type of diabetes and pathophysiology  Describe the relationship between diabetes and cardiovascular risk  State the members of the Healthcare Team  States the rationale for glucose monitoring  State when to test glucose  State their individual Target Range  State the importance of logging glucose readings  Describe how to interpret glucose readings  Identifies A1C target  Explain the correlation between A1c and eAG values  State symptoms and treatment of high blood glucose  State symptoms and treatment of low blood glucose  Explain proper technique for glucose testing  Identifies proper sharps disposal  Handouts given during class include:  Living Well with Diabetes book  Carb Counting and Meal Planning book  Meal Plan Card  Carbohydrate guide  Meal planning worksheet  Low Sodium Flavoring Tips  The diabetes portion plate  Q7Y to eAG Conversion Chart  Diabetes Medications  Diabetes Recommended Care Schedule  Support Group  Diabetes Success Plan  Core Class Satisfaction Survey  Follow-Up Plan:  Attend core 2

## 2014-06-09 ENCOUNTER — Other Ambulatory Visit: Payer: Self-pay | Admitting: Internal Medicine

## 2014-06-10 ENCOUNTER — Ambulatory Visit (INDEPENDENT_AMBULATORY_CARE_PROVIDER_SITE_OTHER): Payer: Medicare Other | Admitting: Internal Medicine

## 2014-06-10 ENCOUNTER — Encounter: Payer: Self-pay | Admitting: Internal Medicine

## 2014-06-10 ENCOUNTER — Telehealth: Payer: Self-pay | Admitting: Internal Medicine

## 2014-06-10 VITALS — BP 120/70 | HR 61 | Temp 97.6°F | Resp 20 | Ht 62.5 in | Wt 152.0 lb

## 2014-06-10 DIAGNOSIS — I10 Essential (primary) hypertension: Secondary | ICD-10-CM

## 2014-06-10 DIAGNOSIS — E119 Type 2 diabetes mellitus without complications: Secondary | ICD-10-CM

## 2014-06-10 DIAGNOSIS — I471 Supraventricular tachycardia: Secondary | ICD-10-CM

## 2014-06-10 DIAGNOSIS — R55 Syncope and collapse: Secondary | ICD-10-CM

## 2014-06-10 DIAGNOSIS — E139 Other specified diabetes mellitus without complications: Secondary | ICD-10-CM

## 2014-06-10 DIAGNOSIS — I498 Other specified cardiac arrhythmias: Secondary | ICD-10-CM

## 2014-06-10 DIAGNOSIS — I4892 Unspecified atrial flutter: Secondary | ICD-10-CM

## 2014-06-10 NOTE — Progress Notes (Signed)
Pre visit review using our clinic review tool, if applicable. No additional management support is needed unless otherwise documented below in the visit note. 

## 2014-06-10 NOTE — Progress Notes (Signed)
   Subjective:    Patient ID: [name], female    DOB: [birth year], 73 y.o.   MRN: [id number]  HPI  Lab Results  Component Value Date   HGBA1C 6.6* 04/10/2014    Review of Systems     Objective:   Physical Exam        Assessment & Plan:

## 2014-06-10 NOTE — Patient Instructions (Signed)
Limit your sodium (Salt) intake    It is important that you exercise regularly, at least 20 minutes 3 to 4 times per week.  If you develop chest pain or shortness of breath seek  medical attention.   Please check your hemoglobin A1c every 3 months   

## 2014-06-10 NOTE — Progress Notes (Signed)
Subjective:    Patient ID: [name], female    DOB: [birth year], 73 y.o.   MRN: [id number]  HPI 73 y/o seen today for f.u.  Hospital records and consultants notes  Reviewed.  Stable neurologically.  Now monitoring home  BS with nice results.  Scheduled for diabetic teaching Cardicac status stable; now on amio. No CP clos  Past Medical History  Diagnosis Date  . ANEMIA 08/28/2010  . CAD (coronary artery disease)     a. s/p CABGx4 in 2001.  Marland Kitchen COLONIC POLYPS, HX OF 06/15/2007  . COPD (chronic obstructive pulmonary disease)   . Depressive disorder   . GERD 12/19/2009  . Hyperlipidemia   . Hypertension   . HYPOTENSION 08/11/2010  . Osteoarthritis   . PVD (peripheral vascular disease)     a. Duplex 03/2013: stable moderate carotid dz - 30-07% RICA, 62-26% LICA, L subclavian artery to CCA stent widely patent.  b. Prior R fempop bypass graft, R CIA stent.  . Lumbar stenosis   . Lung abscess 2011  . CHF (congestive heart failure)   . Hx of colonoscopy   . Paroxysmal atrial flutter     a. Dx 01/2014, placed on Xarelto.  . Aortic stenosis     a. Mild by echo 01/2013.  . Diabetes mellitus without complication     History   Social History  . Marital Status: Widowed    Spouse Name: N/A    Number of Children: 2  . Years of Education: N/A   Occupational History  .     Social History Main Topics  . Smoking status: Former Smoker -- 1.50 packs/day for 40 years    Types: Cigarettes    Quit date: 06/29/2000  . Smokeless tobacco: Never Used  . Alcohol Use: No  . Drug Use: No  . Sexual Activity: Not on file   Other Topics Concern  . Not on file   Social History Narrative  . No narrative on file    Past Surgical History  Procedure Laterality Date  . L subclavian bypass  07/2004  . Tubal ligation    . Coronary artery bypass graft  05/2000  . Femoral popliteal bypass-r  04/1999  . Right common iliac pta with stent placement  07/2000  . Right leg blockage  2003  . Aorta  bifemoral bypass graft  2004  . Left cea  2005  . Lung cyst biopsy  2011    Family History  Problem Relation Age of Onset  . Diabetes Father   . Heart attack Mother   . Diabetes    . Coronary artery disease    . Colon cancer Neg Hx     Allergies  Allergen Reactions  . Codeine Phosphate     REACTION: unspecified    Current Outpatient Prescriptions on File Prior to Visit  Medication Sig Dispense Refill  . ACCU-CHEK SOFTCLIX LANCETS lancets 1 each by Other route daily as needed for other.  100 each  12  . amiodarone (PACERONE) 200 MG tablet TAKE 2 TABLET (400MG ) BY MOUTH DAILY  60 tablet  2  . amiodarone (PACERONE) 400 MG tablet Take 1 tablet (400 mg total) by mouth daily.  30 tablet  0  . atorvastatin (LIPITOR) 40 MG tablet Take 1 tablet (40 mg total) by mouth every evening.  90 tablet  1  . cilostazol (PLETAL) 100 MG tablet Take 100 mg by mouth 2 (two) times daily.      Marland Kitchen diltiazem (CARDIZEM  CD) 120 MG 24 hr capsule Take 1 capsule by mouth daily.      . furosemide (LASIX) 40 MG tablet Take 0.5 tablets (20 mg total) by mouth daily.  90 tablet  6  . gabapentin (NEURONTIN) 600 MG tablet Take 1 tablet (600 mg total) by mouth 3 (three) times daily.  270 tablet  3  . glucose blood (ACCU-CHEK AVIVA PLUS) test strip 1 each by Other route daily as needed for other.  100 each  12  . HYDROcodone-acetaminophen (NORCO/VICODIN) 5-325 MG per tablet Take 0.5 tablets by mouth every 6 (six) hours as needed for moderate pain.  90 tablet  0  . LORazepam (ATIVAN) 0.5 MG tablet Take 1 tablet (0.5 mg total) by mouth 2 (two) times daily as needed for anxiety.  60 tablet  0  . losartan (COZAAR) 25 MG tablet TAKE 1 TABLET (25 MG TOTAL) BY MOUTH DAILY.  90 tablet  0  . meloxicam (MOBIC) 7.5 MG tablet Take 7.5 mg by mouth daily.       . metFORMIN (GLUCOPHAGE) 500 MG tablet Take 1 tablet (500 mg total) by mouth 2 (two) times daily with a meal.  180 tablet  3  . metoprolol (LOPRESSOR) 50 MG tablet Take 25 mg by  mouth 2 (two) times daily.       . Multiple Vitamins-Minerals (CENTRUM SILVER ULTRA WOMENS PO) Take 1 capsule by mouth daily.       . nitroGLYCERIN (NITROSTAT) 0.4 MG SL tablet Place 0.4 mg under the tongue every 5 (five) minutes as needed for chest pain.      . potassium chloride (K-DUR) 10 MEQ tablet Take 1 tablet (10 mEq total) by mouth daily.  30 tablet  6  . promethazine (PHENERGAN) 25 MG tablet Take 1 tablet (25 mg total) by mouth every 6 (six) hours as needed for nausea or vomiting.  20 tablet  2  . rivaroxaban (XARELTO) 20 MG TABS tablet Take 1 tablet (20 mg total) by mouth daily with supper.  30 tablet  6  . sertraline (ZOLOFT) 50 MG tablet Take 50 mg by mouth daily.       Marland Kitchen tiotropium (SPIRIVA) 18 MCG inhalation capsule Place 1 capsule (18 mcg total) into inhaler and inhale daily.  30 capsule  0   No current facility-administered medications on file prior to visit.    BP 120/70  Pulse 61  Temp(Src) 97.6 F (36.4 C) (Oral)  Resp 20  Ht 5' 2.5" (1.588 m)  Wt 152 lb (68.947 kg)  BMI 27.34 kg/m2  SpO2 95%      Review of Systems  Constitutional: Positive for fatigue.  HENT: Negative for congestion, dental problem, hearing loss, rhinorrhea, sinus pressure, sore throat and tinnitus.   Eyes: Negative for pain, discharge and visual disturbance.  Respiratory: Negative for cough and shortness of breath.   Cardiovascular: Negative for chest pain, palpitations and leg swelling.  Gastrointestinal: Negative for nausea, vomiting, abdominal pain, diarrhea, constipation, blood in stool and abdominal distention.  Genitourinary: Negative for dysuria, urgency, frequency, hematuria, flank pain, vaginal bleeding, vaginal discharge, difficulty urinating, vaginal pain and pelvic pain.  Musculoskeletal: Negative for arthralgias, gait problem and joint swelling.  Skin: Negative for rash.  Neurological: Negative for dizziness, syncope, speech difficulty, weakness, numbness and headaches.    Hematological: Negative for adenopathy.  Psychiatric/Behavioral: Negative for behavioral problems, dysphoric mood and agitation. The patient is not nervous/anxious.        Objective:   Physical Exam  Constitutional:  She is oriented to person, place, and time. She appears well-developed and well-nourished.  HENT:  Head: Normocephalic.  Right Ear: External ear normal.  Left Ear: External ear normal.  Mouth/Throat: Oropharynx is clear and moist.  Eyes: Conjunctivae and EOM are normal. Pupils are equal, round, and reactive to light.  Neck: Normal range of motion. Neck supple. No thyromegaly present.  bilat loud bruits  Cardiovascular: Normal rate and regular rhythm.   Murmur heard. Pulmonary/Chest: Effort normal and breath sounds normal.  Abdominal: Soft. Bowel sounds are normal. She exhibits no mass. There is no tenderness.  Musculoskeletal: Normal range of motion.  Lymphadenopathy:    She has no cervical adenopathy.  Neurological: She is alert and oriented to person, place, and time.  Skin: Skin is warm and dry. No rash noted.  Psychiatric: She has a normal mood and affect. Her behavior is normal.          Assessment & Plan:   S/p CVA PAF HTN DM2- continue metformin

## 2014-06-10 NOTE — Telephone Encounter (Signed)
Relevant patient education mailed to patient.  

## 2014-06-11 DIAGNOSIS — E119 Type 2 diabetes mellitus without complications: Secondary | ICD-10-CM | POA: Diagnosis not present

## 2014-06-11 NOTE — Progress Notes (Signed)

## 2014-06-14 ENCOUNTER — Telehealth: Payer: Self-pay | Admitting: Critical Care Medicine

## 2014-06-14 NOTE — Telephone Encounter (Signed)
Called and spoke to pt. Pt stated she needed Spiriva samples. Informed pt that her sample of spiriva has been left up front for pick up. Pt denied needing spiriva refill. Pt verbalized understanding and denied any further questions or concerns at this time.

## 2014-06-18 DIAGNOSIS — E119 Type 2 diabetes mellitus without complications: Secondary | ICD-10-CM

## 2014-06-18 NOTE — Progress Notes (Signed)
Patient was seen on 06/18/14 for the third of a series of three diabetes self-management courses at the [facility]. The following learning objectives were met by the patient during this class:    State the amount of activity recommended for healthy living   Describe activities suitable for individual needs   Identify ways to regularly incorporate activity into daily life   Identify barriers to activity and ways to over come these barriers  Identify diabetes medications being personally used and their primary action for lowering glucose and possible side effects   Describe role of stress on blood glucose and develop strategies to address psychosocial issues   Identify diabetes complications and ways to prevent them  Explain how to manage diabetes during illness   Evaluate success in meeting personal goal   Establish 2-3 goals that they will plan to diligently work on until they return for the  4-month follow-up visit  Goals:  Follow Diabetes Meal Plan as instructed  Aim for 15-30 mins of physical activity daily as tolerated  Bring food record and glucose log to your follow up visit  Your patient has established the following 4 month goals in their individualized success plan: I will count my carb choices at most meals and snacks  Your patient has identified these potential barriers to change:  Motivation  Your patient has identified their diabetes self-care support plan as  [facility] Support Group  My daughter, Sheila  Plan:  Attend Core 4 in 4 months    

## 2014-07-01 ENCOUNTER — Encounter: Payer: Self-pay | Admitting: Critical Care Medicine

## 2014-07-01 ENCOUNTER — Ambulatory Visit (INDEPENDENT_AMBULATORY_CARE_PROVIDER_SITE_OTHER): Payer: Medicare Other | Admitting: Critical Care Medicine

## 2014-07-01 VITALS — BP 164/76 | HR 64 | Temp 97.1°F | Ht 62.5 in | Wt 151.0 lb

## 2014-07-01 DIAGNOSIS — Z23 Encounter for immunization: Secondary | ICD-10-CM

## 2014-07-01 DIAGNOSIS — J449 Chronic obstructive pulmonary disease, unspecified: Secondary | ICD-10-CM

## 2014-07-01 NOTE — Patient Instructions (Signed)
Prevnar 13 was given No change in medications Return 4 months Get flu vaccine this fall

## 2014-07-01 NOTE — Progress Notes (Signed)
Subjective:    Patient ID: [name], female    DOB: March 10, 1941, 73 y.o.   MRN: [id number]  HPI  07/01/2014 Chief Complaint  Patient presents with  . Yearly Follow up    DOE is unchanged.  No wheezing, chest tightness/pain, or cough.   Pt in hospt twice last 6 mo.  Cardiac issues.  Then ??CVA.  Symptoms: getting tired while working. Issues with head and vision .  Pt had h/a all AM, then fell.    Review of Systems Constitutional:   No  weight loss, night sweats,  Fevers, chills, fatigue, lassitude. HEENT:   No headaches,  Difficulty swallowing,  Tooth/dental problems,  Sore throat,                No sneezing, itching, ear ache, nasal congestion, post nasal drip,   CV:  No chest pain,  Orthopnea, PND, swelling in lower extremities, anasarca, dizziness, palpitations  GI  No heartburn, indigestion, abdominal pain, nausea, vomiting, diarrhea, change in bowel habits, loss of appetite  Resp: No shortness of breath with exertion or at rest.  No excess mucus, no productive cough,  No non-productive cough,  No coughing up of blood.  No change in color of mucus.  No wheezing.  No chest wall deformity  Skin: no rash or lesions.  GU: no dysuria, change in color of urine, no urgency or frequency.  No flank pain.  MS:  No joint pain or swelling.  No decreased range of motion.  No back pain.  Psych:  No change in mood or affect. No depression or anxiety.  No memory loss.     Objective:   Physical Exam  Filed Vitals:   07/01/14 1002  BP: 164/76  Pulse: 64  Temp: 97.1 F (36.2 C)  TempSrc: Oral  Height: 5' 2.5" (1.588 m)  Weight: 151 lb (68.493 kg)  SpO2: 98%    Gen: Pleasant, well-nourished, in no distress,  normal affect  ENT: No lesions,  mouth clear,  oropharynx clear, no postnasal drip  Neck: No JVD, no TMG, no carotid bruits  Lungs: No use of accessory muscles, no dullness to percussion, distant breath sounds  Cardiovascular: RRR, heart sounds normal, no murmur or  gallops, no peripheral edema  Abdomen: soft and NT, no HSM,  BS normal  Musculoskeletal: No deformities, no cyanosis or clubbing  Neuro: alert, non focal  Skin: Warm, no lesions or rashes  No results found.       Assessment & Plan:   COPD Gold B Gold stage B. COPD stable at this time Plan Maintain inhaled medications as prescribed Prevnar 13 vaccine was administered   Updated Medication List Outpatient Encounter Prescriptions as of 07/01/2014  Medication Sig  . ACCU-CHEK SOFTCLIX LANCETS lancets 1 each by Other route daily as needed for other.  Marland Kitchen amiodarone (PACERONE) 400 MG tablet Take 1 tablet (400 mg total) by mouth daily.  Marland Kitchen atorvastatin (LIPITOR) 40 MG tablet Take 1 tablet (40 mg total) by mouth every evening.  . cilostazol (PLETAL) 100 MG tablet Take 100 mg by mouth 2 (two) times daily.  Marland Kitchen diltiazem (CARDIZEM CD) 120 MG 24 hr capsule Take 1 capsule by mouth daily.  . furosemide (LASIX) 40 MG tablet Take 0.5 tablets (20 mg total) by mouth daily.  Marland Kitchen gabapentin (NEURONTIN) 600 MG tablet Take 1 tablet (600 mg total) by mouth 3 (three) times daily.  Marland Kitchen glimepiride (AMARYL) 1 MG tablet TAKE 1 TABLET BY MOUTH DAILY WITH BREAKFAST  .  glucose blood (ACCU-CHEK AVIVA PLUS) test strip 1 each by Other route daily as needed for other.  . HYDROcodone-acetaminophen (NORCO/VICODIN) 5-325 MG per tablet Take 0.5 tablets by mouth every 6 (six) hours as needed for moderate pain.  Marland Kitchen LORazepam (ATIVAN) 0.5 MG tablet Take 1 tablet (0.5 mg total) by mouth 2 (two) times daily as needed for anxiety.  Marland Kitchen losartan (COZAAR) 25 MG tablet TAKE 1 TABLET (25 MG TOTAL) BY MOUTH DAILY.  . meloxicam (MOBIC) 7.5 MG tablet Take 7.5 mg by mouth daily.   . metFORMIN (GLUCOPHAGE) 500 MG tablet Take 1 tablet (500 mg total) by mouth 2 (two) times daily with a meal.  . metoprolol (LOPRESSOR) 50 MG tablet Take 25 mg by mouth 2 (two) times daily.   . Multiple Vitamins-Minerals (CENTRUM SILVER ULTRA WOMENS PO) Take 1  capsule by mouth daily.   . nitroGLYCERIN (NITROSTAT) 0.4 MG SL tablet Place 0.4 mg under the tongue every 5 (five) minutes as needed for chest pain.  . potassium chloride (K-DUR) 10 MEQ tablet Take 1 tablet (10 mEq total) by mouth daily.  . promethazine (PHENERGAN) 25 MG tablet Take 1 tablet (25 mg total) by mouth every 6 (six) hours as needed for nausea or vomiting.  . rivaroxaban (XARELTO) 20 MG TABS tablet Take 1 tablet (20 mg total) by mouth daily with supper.  . sertraline (ZOLOFT) 50 MG tablet Take 25 mg by mouth daily.   Marland Kitchen tiotropium (SPIRIVA) 18 MCG inhalation capsule Place 1 capsule (18 mcg total) into inhaler and inhale daily.  . [DISCONTINUED] amiodarone (PACERONE) 200 MG tablet TAKE 2 TABLET (400MG ) BY MOUTH DAILY

## 2014-07-01 NOTE — Assessment & Plan Note (Signed)
Gold stage B. COPD stable at this time Plan Maintain inhaled medications as prescribed Prevnar 13 vaccine was administered

## 2014-07-03 ENCOUNTER — Encounter: Payer: Self-pay | Admitting: Cardiology

## 2014-07-03 ENCOUNTER — Ambulatory Visit (INDEPENDENT_AMBULATORY_CARE_PROVIDER_SITE_OTHER): Payer: Medicare Other | Admitting: Cardiology

## 2014-07-03 VITALS — BP 162/64 | HR 70 | Ht 62.5 in | Wt 150.0 lb

## 2014-07-03 DIAGNOSIS — J449 Chronic obstructive pulmonary disease, unspecified: Secondary | ICD-10-CM

## 2014-07-03 DIAGNOSIS — I350 Nonrheumatic aortic (valve) stenosis: Secondary | ICD-10-CM

## 2014-07-03 DIAGNOSIS — I679 Cerebrovascular disease, unspecified: Secondary | ICD-10-CM

## 2014-07-03 DIAGNOSIS — I359 Nonrheumatic aortic valve disorder, unspecified: Secondary | ICD-10-CM

## 2014-07-03 MED ORDER — AMIODARONE HCL 200 MG PO TABS: 200.0000 mg | ORAL_TABLET | Freq: Every day | ORAL | Status: DC

## 2014-07-03 MED ORDER — LOSARTAN POTASSIUM 50 MG PO TABS: 50.0000 mg | ORAL_TABLET | Freq: Every day | ORAL | Status: DC

## 2014-07-03 NOTE — Patient Instructions (Addendum)
Your physician has recommended you make the following change in your medication:   CONTINUE TAKING YOUR AMIODARONE 200 MG DAILY  INCREASE YOUR LOSARTAN TO [place] MD HAS PRESCRIBED FOR YOU TO HAVE A BP MACHINE  Your physician recommends that you schedule a follow-up appointment in: [place]

## 2014-07-03 NOTE — Progress Notes (Signed)
Patient ID: [name], female   DOB: [birth year], 73 y.o.   MRN: [id number]    Patient Name: [name] Date of Encounter: 07/03/2014  Primary Care Provider:  Nyoka Cowden, MD Primary Cardiologist:  Dorothy Spark  Problem List   Past Medical History  Diagnosis Date  . ANEMIA 08/28/2010  . CAD (coronary artery disease)     a. s/p CABGx4 in 2001.  Marland Kitchen COLONIC POLYPS, HX OF 06/15/2007  . COPD (chronic obstructive pulmonary disease)   . Depressive disorder   . GERD 12/19/2009  . Hyperlipidemia   . Hypertension   . HYPOTENSION 08/11/2010  . Osteoarthritis   . PVD (peripheral vascular disease)     a. Duplex 03/2013: stable moderate carotid dz - 25-85% RICA, 27-78% LICA, L subclavian artery to CCA stent widely patent.  b. Prior R fempop bypass graft, R CIA stent.  . Lumbar stenosis   . Lung abscess 2011  . CHF (congestive heart failure)   . Hx of colonoscopy   . Paroxysmal atrial flutter     a. Dx 01/2014, placed on Xarelto.  . Aortic stenosis     a. Mild by echo 01/2013.  . Diabetes mellitus without complication    Past Surgical History  Procedure Laterality Date  . L subclavian bypass  07/2004  . Tubal ligation    . Coronary artery bypass graft  05/2000  . Femoral popliteal bypass-r  04/1999  . Right common iliac pta with stent placement  07/2000  . Right leg blockage  2003  . Aorta bifemoral bypass graft  2004  . Left cea  2005  . Lung cyst biopsy  2011   Allergies  Allergies  Allergen Reactions  . Codeine Phosphate     REACTION: unspecified   HPI  Mrs. [name] is a 73 year old female with h/o CAD, CABG in 2001 and carotid disease who was previously followed by Dr Verl Blalock. She was doing really well since the CABG until March of this year when she presented with paroxymal  dizziness and weakness. She also complained of exertional shortness of breath and fatigue. She denied chest pain, orthopnea paroxysmal nocturnal dyspnea or lower extremity edema. She  denies any syncope. She was diagnosed with SVT, possibly 2:1 flutter with ventricular rate 160 BPM (ECG on 02/19/2014). She was admitted to the hospital the next day for TEE/CV but was found to be in SR already.  She underwent a Lexiscan nuclear stress test that showed anterior ischemia. The patient had a cardiac cath that showed 3 VD with the SVG to the OM and SVG to the RCA widely patent. Because of known left subclavian occlusion ( s/p left subclavian to carotid bypass) an attempt to access the IMA graft from a right radial approach was tried but was unsuccessful. The artery was small on ultrasound. The patient is scheduled for a CTA to confirm IMA patency and assess functionality of the left carotid to subclavian bypass graft.  Left ventricular systolic function is normal, LVEF is estimated at 60 - 65%, there is no significant mitral regurgitation.  Meanwhile she was wearing e-cardio monitor, she was found to have at least 6 episodes of SVT, some of then regular, some irregular with HR 160 - 190 BPM. The patient denies palpitations of syncope, however she felt profound fatigue.  The patient was admitted on 04/09/2014 after an episode of presyncope and almost daily episodes of migraines. The patient describes these as retro-ocular or involving right side of her head.  Upon admission patient had a CT scan of head which showed an acute infarct, right parietal lobe. Patient had been on xarelto and aspirin prior to admission and these were continued . Neurology was consulted and they saw the patient and recommended to continue Xarelto (does not need to be on aspirin in addition per Dr. Leonie Man)  - It was noted that patient had had carotid Doppler as done on 5/7 per Dr. Meda Coffee and that this had showed an abnormality>> vascular surgery was consulted and CT angiogram of the neck was done and showed high-grade complex plaque involving the proximal innominate artery with extensive calcification and irregularity  estimated at least 90% stenosis, and estimated 75-80% calcific stenosis of left internal carotid artery origin noted. Dr. Kellie Simmering saw the patient and recommended no surgical intervention for the innominate stenosis. Regarding the left ICA stenosis he recommended outpatient followup in 6 months carotid Doppler to be repeated at that time in his office.   -A1c was done to complete stroke workup and was elevated at 6.9, patient's sugars were monitored and she was covered with sliding scale insulin. She'll be discharged on Amaryl and is to followup with her PCP for continued monitoring and dose adjustment as clinically appropriate.   The patient saw Dr. Lovena Le for management of paroxysmal atrial fibrillation and atrial flutter. He felt that ablation for atrial flutter wouldn't help as she also has episodes of atrial fibrillation her e- cardio monitor. He recommended to continue amiodarone by mouth.  July 03, 2014 - the patient stopped working and she has been having. She walks about a mile every day and is much more strict about her diet. She denies any chest pain or shortness of breath other than on moderate exertion. No recent dizziness or palpitations. No syncope. The patient is complaining of alternating constipation and diarrheas, sometimes with blood. She hasn't had colonoscopy in the last 4 years.  Home Medications  Prior to Admission medications   Medication Sig Start Date End Date Taking? Authorizing Provider  acetaminophen (TYLENOL) 325 MG tablet Take 650 mg by mouth every 6 (six) hours as needed.      Historical Provider, MD  albuterol (PROVENTIL HFA;VENTOLIN HFA) 108 (90 BASE) MCG/ACT inhaler Inhale 2 puffs into the lungs every 6 (six) hours as needed for wheezing or shortness of breath.    Historical Provider, MD  atorvastatin (LIPITOR) 40 MG tablet Take 1 tablet (40 mg total) by mouth every evening. 02/21/14   Dayna N Dunn, PA-C  cilostazol (PLETAL) 100 MG tablet Take 100 mg by mouth 2 (two)  times daily.    Historical Provider, MD  cilostazol (PLETAL) 100 MG tablet TAKE 1 TABLET BY MOUTH 2 TIMES A DAY 02/27/14   Marletta Lor, MD  diltiazem (CARDIZEM CD) 120 MG 24 hr capsule Take 1 capsule (120 mg total) by mouth daily.    Dorothy Spark, MD  gabapentin (NEURONTIN) 600 MG tablet Take 1 tablet (600 mg total) by mouth 3 (three) times daily. 07/23/13   Marletta Lor, MD  hydrochlorothiazide (HYDRODIURIL) 25 MG tablet Take 25 mg by mouth daily.  09/03/13   Historical Provider, MD  HYDROcodone-acetaminophen (NORCO/VICODIN) 5-325 MG per tablet Take 1 tablet by mouth every 6 (six) hours as needed for moderate pain.    Historical Provider, MD  LORazepam (ATIVAN) 0.5 MG tablet Take 0.5 mg by mouth 2 (two) times daily as needed for anxiety.    Historical Provider, MD  losartan (COZAAR) 25 MG tablet Take 25  mg by mouth daily.    Historical Provider, MD  metoprolol tartrate (LOPRESSOR) 25 MG tablet Take 2 tablets (50 mg total) by mouth 2 (two) times daily. 03/12/14   Dorothy Spark, MD  nitroGLYCERIN (NITROSTAT) 0.4 MG SL tablet Place 0.4 mg under the tongue every 5 (five) minutes as needed. 08/13/11   Renella Cunas, MD  omeprazole (PRILOSEC) 20 MG capsule Take 20 mg by mouth daily.    Historical Provider, MD  potassium chloride (K-DUR) 10 MEQ tablet Take 1 tablet (10 mEq total) by mouth daily. 03/12/14   Peter M Martinique, MD  promethazine (PHENERGAN) 25 MG tablet Take 25 mg by mouth every 6 (six) hours as needed for nausea or vomiting.    Historical Provider, MD  rivaroxaban (XARELTO) 20 MG TABS tablet Take 1 tablet (20 mg total) by mouth daily with supper. 03/21/14   Dorothy Spark, MD  sertraline (ZOLOFT) 50 MG tablet Take 25 mg by mouth daily.    Historical Provider, MD  therapeutic multivitamin-minerals (THERAGRAN-M) tablet Take 1 tablet by mouth daily.    Historical Provider, MD  tiotropium (SPIRIVA HANDIHALER) 18 MCG inhalation capsule Place 1 capsule (18 mcg total) into inhaler  and inhale daily. 03/18/14 03/18/15  Elsie Stain, MD  trolamine salicylate (ASPERCREME) 10 % cream Apply 1 application topically as needed for muscle pain.    Historical Provider, MD    Family History  Family History  Problem Relation Age of Onset  . Diabetes Father   . Heart attack Mother   . Diabetes    . Coronary artery disease    . Colon cancer Neg Hx     Social History  History   Social History  . Marital Status: Widowed    Spouse Name: N/A    Number of Children: 2  . Years of Education: N/A   Occupational History  .     Social History Main Topics  . Smoking status: Former Smoker -- 1.50 packs/day for 40 years    Types: Cigarettes    Quit date: 06/29/2000  . Smokeless tobacco: Never Used  . Alcohol Use: No  . Drug Use: No  . Sexual Activity: Not on file   Other Topics Concern  . Not on file   Social History Narrative  . No narrative on file     Review of Systems, as per HPI, otherwise negative General:  No chills, fever, night sweats or weight changes.  Cardiovascular:  No chest pain, dyspnea on exertion, edema, orthopnea, palpitations, paroxysmal nocturnal dyspnea. Dermatological: No rash, lesions/masses Respiratory: No cough, dyspnea Urologic: No hematuria, dysuria Abdominal:   No nausea, vomiting, diarrhea, bright red blood per rectum, melena, or hematemesis Neurologic:  No visual changes, wkns, changes in mental status. All other systems reviewed and are otherwise negative except as noted above.  Physical Exam  Blood pressure 162/64, pulse 70, height 5' 2.5" (1.588 m), weight 150 lb (68.04 kg).  General: Pleasant, NAD Psych: Normal affect. Neuro: Alert and oriented X 3. Moves all extremities spontaneously. HEENT: Normal  Neck: Supple without bruits or JVD. Lungs:  Resp regular and unlabored, CTA. Heart: RRR no s3, s4, or murmurs. Abdomen: Soft, non-tender, non-distended, BS + x 4.  Extremities: No clubbing, cyanosis or edema. DP/PT/Radials  2+ and equal bilaterally.  Labs:  No results found for this basename: CKTOTAL, CKMB, TROPONINI,  in the last 72 hours Lab Results  Component Value Date   WBC 7.3 04/26/2014   HGB 11.0* 04/26/2014  HCT 33.7* 04/26/2014   MCV 82.5 04/26/2014   PLT 248.0 04/26/2014       Component Value Date/Time   NA 145 04/13/2014 0417   K 4.2 04/13/2014 0417   CL 106 04/13/2014 0417   CO2 25 04/13/2014 0417   GLUCOSE 96 04/13/2014 0417   GLUCOSE 91 10/31/2006 0948   BUN 17 04/13/2014 0417   CREATININE 1.05 04/13/2014 0417   CALCIUM 9.6 04/13/2014 0417   CALCIUM 10.8* 04/09/2014 2101   PROT 6.2 04/13/2014 0417   ALBUMIN 3.1* 04/13/2014 0417   AST 17 04/13/2014 0417   ALT 11 04/13/2014 0417   ALKPHOS 68 04/13/2014 0417   BILITOT 0.5 04/13/2014 0417   GFRNONAA 52* 04/13/2014 0417   GFRAA 60* 04/13/2014 0417   Lab Results  Component Value Date   CHOL 118 04/11/2014   HDL 46 04/11/2014   LDLCALC 45 04/11/2014   TRIG 137 04/11/2014    Accessory Clinical Findings  Echocardiogram 01/2013  - Left ventricle: The cavity size was normal. Wall thickness was increased in a pattern of mild LVH. Systolic function was normal. The estimated ejection fraction was in the range of 55% to 60%. Wall motion was normal; there were no regional wall motion abnormalities. Doppler parameters are consistent with abnormal left ventricular relaxation (grade 1 diastolic dysfunction). - Aortic valve: There was mild stenosis. Mean gradient: 49mm Hg (S). Peak gradient: 48mm Hg (S). - Mitral valve: Calcified annulus. - Left atrium: The atrium was mildly dilated. - Pulmonary arteries: Systolic pressure was mildly increased. PA peak pressure: 57mm Hg (S). Impressions: - Calcified aortic valve with mildly reduced cusp excursion; mild AS by doppler. There is note of elevated gradient from suprasternal notch most likely from right innominate artery (5 m/s); suggest dopplers to further assess.  ECG - sinus rhythm with PACs, 77 beats per  minute otherwise normal EKG.   Cardiac catheterization : 03/11/2014  Coronary angiography:  Coronary dominance: right  Left mainstem: 100% occlusion at the ostium  Left anterior descending (LAD): Not visualized.  Left circumflex (LCx): Not visualized  Right coronary artery (RCA): 100% in the mid vessel.  The SVG to the OM is widely patent.  The SVG to the RCA is widely patent. There are some collaterals to the first septal perforator from the RCA.   Left ventriculography: Left ventricular systolic function is normal, LVEF is estimated at 60 - 65%, there is no significant mitral regurgitation   Given her known left subclavian occlusion we next attempted to access the IMA graft from a right radial approach but unsuccessful. The artery was small on ultrasound.  Recommendations: Medical management. I would consider CTA to confirm IMA patency and assess functionality of the left carotid to subclavian bypass graft.     Assessment & Plan  73 year old female with paroxysmal SVT and DOE, abnormal stress test  1. CAD, s/p CABG in 2001, anterior ischemia on Lexiscan stress test on 03/07/14, cath with 3 occluded vessels, patient SVG to OM and RCA, LIMA not images as chronic left subclavian occlusion, s/p subclavian to carotid bypass and small right radial vessel. CTA to assess LIMA patency scheduled for 04/02/14. However this gets canceled because of scanner problem. Afterwards patient was admitted with an acute stroke. Since the patient has been going through a lot lately dealing with acute stroke, paroxysmal atrial fibrillation and atrial flutter and she is not truly symptomatic with chest pain, for now we will treat medically.  2. paroxysmal a-fib and a-flutter with  2:1 block, ventricular rate 160-190 BPM. Currently on amiodarone into sinus rhythm no recurrent episodes of palpitations. Continue Metoprolol, diltiazem and xarelto. She is in sinus rhythm today.  3. PAD - carotid disease - vascular  surgery was consulted and CT angiogram of the neck was done and showed high-grade complex plaque involving the proximal innominate artery with extensive calcification and irregularity estimated at least 90% stenosis, and estimated 75-80% calcific stenosis of left internal carotid artery origin noted. Dr. Kellie Simmering saw the patient and recommended no surgical intervention for the innominate stenosis. Regarding the left ICA stenosis he recommended outpatient followup in 6 months carotid Doppler to be repeated at that time in his office.  She is informed that if her symptoms deteriorate we will refer to vascular surgery rather earlier than later.  S/P right fem-pop BP, no claudications, on cilostazol.  4. HTN - uncontrolled, we'll increase losartan to 50 mg daily.  5. alternating constipation and diarrhea - she is advised about proper diet weight fibers and plenty of fluids. She is also advised to follow up with GI for colonoscopy. It will be okay BS to hold Xarelto on the day of her colonoscopy.  Followup in 4 months.   Dorothy Spark, MD, [facility] 07/03/2014, 3:20 PM

## 2014-07-16 ENCOUNTER — Other Ambulatory Visit: Payer: Self-pay | Admitting: Internal Medicine

## 2014-07-22 ENCOUNTER — Ambulatory Visit: Payer: Self-pay | Admitting: Nurse Practitioner

## 2014-07-25 LAB — HM MAMMOGRAPHY

## 2014-07-29 ENCOUNTER — Encounter: Payer: Self-pay | Admitting: Internal Medicine

## 2014-07-31 ENCOUNTER — Telehealth: Payer: Self-pay | Admitting: Nurse Practitioner

## 2014-07-31 ENCOUNTER — Ambulatory Visit: Payer: Self-pay | Admitting: Nurse Practitioner

## 2014-07-31 NOTE — Telephone Encounter (Signed)
Patient was no show for today's office appointment. Called same day to reschedule.

## 2014-08-01 ENCOUNTER — Ambulatory Visit (INDEPENDENT_AMBULATORY_CARE_PROVIDER_SITE_OTHER): Payer: Medicare Other | Admitting: Nurse Practitioner

## 2014-08-01 ENCOUNTER — Emergency Department (HOSPITAL_COMMUNITY): Payer: Medicare Other

## 2014-08-01 ENCOUNTER — Encounter: Payer: Self-pay | Admitting: Nurse Practitioner

## 2014-08-01 ENCOUNTER — Encounter (HOSPITAL_COMMUNITY): Payer: Self-pay | Admitting: Emergency Medicine

## 2014-08-01 ENCOUNTER — Inpatient Hospital Stay (HOSPITAL_COMMUNITY)
Admission: EM | Admit: 2014-08-01 | Discharge: 2014-08-07 | DRG: 378 | Disposition: A | Discharge status: Home / Self Care | Payer: Medicare Other | Attending: Internal Medicine | Admitting: Internal Medicine

## 2014-08-01 VITALS — BP 126/49 | HR 76 | Ht 62.0 in | Wt 154.0 lb

## 2014-08-01 DIAGNOSIS — I2510 Atherosclerotic heart disease of native coronary artery without angina pectoris: Secondary | ICD-10-CM | POA: Diagnosis present

## 2014-08-01 DIAGNOSIS — J4489 Other specified chronic obstructive pulmonary disease: Secondary | ICD-10-CM | POA: Diagnosis present

## 2014-08-01 DIAGNOSIS — R0600 Dyspnea, unspecified: Secondary | ICD-10-CM

## 2014-08-01 DIAGNOSIS — Z7901 Long term (current) use of anticoagulants: Secondary | ICD-10-CM

## 2014-08-01 DIAGNOSIS — K219 Gastro-esophageal reflux disease without esophagitis: Secondary | ICD-10-CM | POA: Diagnosis present

## 2014-08-01 DIAGNOSIS — N179 Acute kidney failure, unspecified: Secondary | ICD-10-CM | POA: Diagnosis present

## 2014-08-01 DIAGNOSIS — F329 Major depressive disorder, single episode, unspecified: Secondary | ICD-10-CM

## 2014-08-01 DIAGNOSIS — Z86010 Personal history of colonic polyps: Secondary | ICD-10-CM

## 2014-08-01 DIAGNOSIS — I10 Essential (primary) hypertension: Secondary | ICD-10-CM

## 2014-08-01 DIAGNOSIS — F3289 Other specified depressive episodes: Secondary | ICD-10-CM | POA: Diagnosis present

## 2014-08-01 DIAGNOSIS — Z885 Allergy status to narcotic agent status: Secondary | ICD-10-CM

## 2014-08-01 DIAGNOSIS — K5521 Angiodysplasia of colon with hemorrhage: Secondary | ICD-10-CM | POA: Diagnosis not present

## 2014-08-01 DIAGNOSIS — K921 Melena: Secondary | ICD-10-CM | POA: Diagnosis not present

## 2014-08-01 DIAGNOSIS — Z791 Long term (current) use of non-steroidal anti-inflammatories (NSAID): Secondary | ICD-10-CM

## 2014-08-01 DIAGNOSIS — I25810 Atherosclerosis of coronary artery bypass graft(s) without angina pectoris: Secondary | ICD-10-CM | POA: Diagnosis present

## 2014-08-01 DIAGNOSIS — K449 Diaphragmatic hernia without obstruction or gangrene: Secondary | ICD-10-CM | POA: Diagnosis present

## 2014-08-01 DIAGNOSIS — Z87891 Personal history of nicotine dependence: Secondary | ICD-10-CM

## 2014-08-01 DIAGNOSIS — E785 Hyperlipidemia, unspecified: Secondary | ICD-10-CM | POA: Diagnosis present

## 2014-08-01 DIAGNOSIS — D500 Iron deficiency anemia secondary to blood loss (chronic): Secondary | ICD-10-CM | POA: Diagnosis present

## 2014-08-01 DIAGNOSIS — R9431 Abnormal electrocardiogram [ECG] [EKG]: Secondary | ICD-10-CM

## 2014-08-01 DIAGNOSIS — D126 Benign neoplasm of colon, unspecified: Secondary | ICD-10-CM | POA: Diagnosis present

## 2014-08-01 DIAGNOSIS — J449 Chronic obstructive pulmonary disease, unspecified: Secondary | ICD-10-CM | POA: Diagnosis present

## 2014-08-01 DIAGNOSIS — I2489 Other forms of acute ischemic heart disease: Secondary | ICD-10-CM | POA: Diagnosis present

## 2014-08-01 DIAGNOSIS — I129 Hypertensive chronic kidney disease with stage 1 through stage 4 chronic kidney disease, or unspecified chronic kidney disease: Secondary | ICD-10-CM | POA: Diagnosis present

## 2014-08-01 DIAGNOSIS — I350 Nonrheumatic aortic (valve) stenosis: Secondary | ICD-10-CM

## 2014-08-01 DIAGNOSIS — E118 Type 2 diabetes mellitus with unspecified complications: Secondary | ICD-10-CM

## 2014-08-01 DIAGNOSIS — D62 Acute posthemorrhagic anemia: Secondary | ICD-10-CM | POA: Diagnosis present

## 2014-08-01 DIAGNOSIS — E119 Type 2 diabetes mellitus without complications: Secondary | ICD-10-CM | POA: Diagnosis present

## 2014-08-01 DIAGNOSIS — I471 Supraventricular tachycardia: Secondary | ICD-10-CM

## 2014-08-01 DIAGNOSIS — I4892 Unspecified atrial flutter: Secondary | ICD-10-CM | POA: Diagnosis present

## 2014-08-01 DIAGNOSIS — M1990 Unspecified osteoarthritis, unspecified site: Secondary | ICD-10-CM

## 2014-08-01 DIAGNOSIS — I779 Disorder of arteries and arterioles, unspecified: Secondary | ICD-10-CM

## 2014-08-01 DIAGNOSIS — Z833 Family history of diabetes mellitus: Secondary | ICD-10-CM

## 2014-08-01 DIAGNOSIS — D649 Anemia, unspecified: Secondary | ICD-10-CM

## 2014-08-01 DIAGNOSIS — K922 Gastrointestinal hemorrhage, unspecified: Secondary | ICD-10-CM

## 2014-08-01 DIAGNOSIS — R42 Dizziness and giddiness: Secondary | ICD-10-CM

## 2014-08-01 DIAGNOSIS — I739 Peripheral vascular disease, unspecified: Secondary | ICD-10-CM

## 2014-08-01 DIAGNOSIS — Z8673 Personal history of transient ischemic attack (TIA), and cerebral infarction without residual deficits: Secondary | ICD-10-CM

## 2014-08-01 DIAGNOSIS — Z6828 Body mass index (BMI) 28.0-28.9, adult: Secondary | ICD-10-CM

## 2014-08-01 DIAGNOSIS — E876 Hypokalemia: Secondary | ICD-10-CM | POA: Diagnosis not present

## 2014-08-01 DIAGNOSIS — I679 Cerebrovascular disease, unspecified: Secondary | ICD-10-CM

## 2014-08-01 DIAGNOSIS — I6340 Cerebral infarction due to embolism of unspecified cerebral artery: Secondary | ICD-10-CM

## 2014-08-01 DIAGNOSIS — I4891 Unspecified atrial fibrillation: Secondary | ICD-10-CM | POA: Diagnosis present

## 2014-08-01 DIAGNOSIS — I248 Other forms of acute ischemic heart disease: Secondary | ICD-10-CM | POA: Diagnosis present

## 2014-08-01 DIAGNOSIS — B3781 Candidal esophagitis: Secondary | ICD-10-CM | POA: Diagnosis present

## 2014-08-01 DIAGNOSIS — I639 Cerebral infarction, unspecified: Secondary | ICD-10-CM

## 2014-08-01 DIAGNOSIS — Z951 Presence of aortocoronary bypass graft: Secondary | ICD-10-CM

## 2014-08-01 DIAGNOSIS — Z8249 Family history of ischemic heart disease and other diseases of the circulatory system: Secondary | ICD-10-CM

## 2014-08-01 DIAGNOSIS — Z9181 History of falling: Secondary | ICD-10-CM

## 2014-08-01 DIAGNOSIS — R55 Syncope and collapse: Secondary | ICD-10-CM

## 2014-08-01 DIAGNOSIS — N189 Chronic kidney disease, unspecified: Secondary | ICD-10-CM | POA: Diagnosis present

## 2014-08-01 DIAGNOSIS — R609 Edema, unspecified: Secondary | ICD-10-CM

## 2014-08-01 DIAGNOSIS — I70209 Unspecified atherosclerosis of native arteries of extremities, unspecified extremity: Secondary | ICD-10-CM | POA: Diagnosis present

## 2014-08-01 DIAGNOSIS — K5730 Diverticulosis of large intestine without perforation or abscess without bleeding: Secondary | ICD-10-CM | POA: Diagnosis present

## 2014-08-01 DIAGNOSIS — I509 Heart failure, unspecified: Secondary | ICD-10-CM | POA: Diagnosis present

## 2014-08-01 LAB — I-STAT TROPONIN, ED: Troponin i, poc: 0.97 ng/mL (ref 0.00–0.08)

## 2014-08-01 LAB — CBC WITH DIFFERENTIAL/PLATELET
Basophils Absolute: 0 10*3/uL (ref 0.0–0.1)
Basophils Relative: 0 % (ref 0–1)
EOS PCT: 1 % (ref 0–5)
Eosinophils Absolute: 0.1 10*3/uL (ref 0.0–0.7)
HCT: 19.3 % — ABNORMAL LOW (ref 36.0–46.0)
HEMOGLOBIN: 5.8 g/dL — AB (ref 12.0–15.0)
LYMPHS ABS: 1.1 10*3/uL (ref 0.7–4.0)
LYMPHS PCT: 11 % — AB (ref 12–46)
MCH: 24.1 pg — ABNORMAL LOW (ref 26.0–34.0)
MCHC: 30.1 g/dL (ref 30.0–36.0)
MCV: 80.1 fL (ref 78.0–100.0)
MONO ABS: 0.7 10*3/uL (ref 0.1–1.0)
MONOS PCT: 7 % (ref 3–12)
Neutro Abs: 7.7 10*3/uL (ref 1.7–7.7)
Neutrophils Relative %: 81 % — ABNORMAL HIGH (ref 43–77)
Platelets: 242 10*3/uL (ref 150–400)
RBC: 2.41 MIL/uL — AB (ref 3.87–5.11)
RDW: 17.8 % — ABNORMAL HIGH (ref 11.5–15.5)
WBC: 9.6 10*3/uL (ref 4.0–10.5)

## 2014-08-01 LAB — I-STAT CHEM 8, ED
BUN: 33 mg/dL — AB (ref 6–23)
CALCIUM ION: 1.08 mmol/L — AB (ref 1.13–1.30)
CHLORIDE: 98 meq/L (ref 96–112)
CREATININE: 1.6 mg/dL — AB (ref 0.50–1.10)
Glucose, Bld: 81 mg/dL (ref 70–99)
HCT: 16 % — ABNORMAL LOW (ref 36.0–46.0)
Hemoglobin: 5.4 g/dL — CL (ref 12.0–15.0)
Potassium: 3 mEq/L — ABNORMAL LOW (ref 3.7–5.3)
Sodium: 135 mEq/L — ABNORMAL LOW (ref 137–147)
TCO2: 22 mmol/L (ref 0–100)

## 2014-08-01 LAB — I-STAT CG4 LACTIC ACID, ED: Lactic Acid, Venous: 3.06 mmol/L — ABNORMAL HIGH (ref 0.5–2.2)

## 2014-08-01 LAB — TROPONIN I: TROPONIN I: 2.27 ng/mL — AB (ref <0.30)

## 2014-08-01 LAB — PREPARE RBC (CROSSMATCH)

## 2014-08-01 LAB — PROTIME-INR
INR: 4.08 — AB (ref 0.00–1.49)
Prothrombin Time: 39.6 seconds — ABNORMAL HIGH (ref 11.6–15.2)

## 2014-08-01 LAB — POC OCCULT BLOOD, ED: Fecal Occult Bld: POSITIVE — AB

## 2014-08-01 MED ORDER — POTASSIUM CHLORIDE 10 MEQ/100ML IV SOLN
10.0000 meq | Freq: Once | INTRAVENOUS | Status: AC
  Administered 2014-08-02: 10 meq via INTRAVENOUS
  Filled 2014-08-01: qty 100

## 2014-08-01 MED ORDER — PANTOPRAZOLE SODIUM 40 MG IV SOLR
40.0000 mg | Freq: Once | INTRAVENOUS | Status: AC
  Administered 2014-08-02: 40 mg via INTRAVENOUS
  Filled 2014-08-01: qty 40

## 2014-08-01 MED ORDER — POTASSIUM CHLORIDE 10 MEQ/100ML IV SOLN
10.0000 meq | Freq: Once | INTRAVENOUS | Status: AC
Start: 2014-08-01 — End: 2014-08-02
  Administered 2014-08-02: 10 meq via INTRAVENOUS
  Filled 2014-08-01: qty 100

## 2014-08-01 NOTE — ED Notes (Signed)
MD notified on critical values.

## 2014-08-01 NOTE — ED Notes (Addendum)
PT been feels like she losses her balance; not mechnical falls; 4 falls since Sunday; hypotensive; on xerelto; reports black tarry stools since this weekend. Pt been hospitalized in past for stroke and falls. Daughter reports left foot swollen. Pt reports vascular issues in right arm make BP readings inaccurate. EMS used left.

## 2014-08-01 NOTE — ED Notes (Signed)
Pt continues to be monitored by blood pressure, pulse ox, and 12 lead.Daughter remains at bedside. Pt asked to provide urine specimen. Pt stated she could not provide one at this time.

## 2014-08-01 NOTE — ED Notes (Signed)
Lab called, magnesium level can be added to cmp

## 2014-08-01 NOTE — Progress Notes (Signed)
PATIENT: [name] DOB: [id number]  REASON FOR VISIT: hospital follow up for stroke HISTORY FROM: patient  HISTORY OF PRESENT ILLNESS: [name] is a 73 year old right-handed woman who comes to the office for first hospital follow up post hospital discharge for stroke. She has a history of CAD, CHF, COPD, paroxysmal atrial flutter (on Xarelto), peripheral vascular disease, hyperlipidemia, hypertension, lung abscess, GERD and depression. She is accompanied by her daughter.   She was admitted to [facility] on 04/09/14 for presyncope. CT Head was unremarkable. 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 Hgb A1c was 6.9. LDL was 45. 2D echo showed LVEF 50-55% and grade 2 diastolic dysfunction. She had an episode of A. fib/flutter rapid ventricular response with associated hypotension. Cardiology started amiodarone, while Cardizem was discontinued and metoprolol was decreased. She had already previously had a carotid doppler performed on 04/04/14, which revealed 40-59% RICA stenosis and 35-36% LICA with patent vertebral arteries. Vascular surgery ordered CTA of the neck, showing at least 90% high-grade complex plaque involving the proximal innominate artery with extensive calcification and irregularity in the right ICA, and 75-80% calcific stenosis of the left ICA. Vascular surgery recommended no intervention for the right ICA. Follow-up was recommended in 6 months to re-evaluate the left ICA. She was continued on Xarelto and aspirin was discontinued. For the new-onset diabetes, she was started on Amaryl. She is tolerating Xarelto well with no signs of significant bleeding or bruising. Daughter states her mother has been weaker than normal this week. She fell on Sunday in her kitchen; she states she is not sure what happened but her right leg tends to be weaker than the left; she was not hurt and did not hit her head. She is having trouble with her balance at times.   REVIEW OF  SYSTEMS: Full 14 system review of systems performed and notable only for: Activity change, appetite change, fatigue, light sensitivity, blurred vision, shortness of breath, palpitations, black stools, diarrhea, daytime sleepiness, back pain, walking difficulty, dizziness, weakness, agitation, behavioral problem, confusion, decreased concentration, depression   ALLERGIES: Allergies  Allergen Reactions  . Codeine Phosphate     REACTION: unspecified    HOME MEDICATIONS: Outpatient Prescriptions Prior to Visit  Medication Sig Dispense Refill  . ACCU-CHEK SOFTCLIX LANCETS lancets 1 each by Other route daily as needed for other.  100 each  12  . amiodarone (PACERONE) 200 MG tablet Take 1 tablet (200 mg total) by mouth daily.  90 tablet  6  . atorvastatin (LIPITOR) 40 MG tablet Take 1 tablet (40 mg total) by mouth every evening.  90 tablet  1  . cilostazol (PLETAL) 100 MG tablet Take 100 mg by mouth 2 (two) times daily.      Marland Kitchen diltiazem (CARDIZEM CD) 120 MG 24 hr capsule Take 1 capsule by mouth daily.      . furosemide (LASIX) 40 MG tablet Take 0.5 tablets (20 mg total) by mouth daily.  90 tablet  6  . gabapentin (NEURONTIN) 600 MG tablet Take 1 tablet (600 mg total) by mouth 3 (three) times daily.  270 tablet  3  . glimepiride (AMARYL) 1 MG tablet TAKE 1 TABLET BY MOUTH DAILY WITH BREAKFAST  30 tablet  5  . glucose blood (ACCU-CHEK AVIVA PLUS) test strip 1 each by Other route daily as needed for other.  100 each  12  . HYDROcodone-acetaminophen (NORCO/VICODIN) 5-325 MG per tablet Take 0.5 tablets by mouth every 6 (six)  hours as needed for moderate pain.  90 tablet  0  . LORazepam (ATIVAN) 0.5 MG tablet TAKE 1 TABLET BY MOUTH TWICE A DAY  60 tablet  2  . losartan (COZAAR) 50 MG tablet Take 1 tablet (50 mg total) by mouth daily.  90 tablet  6  . meloxicam (MOBIC) 7.5 MG tablet Take 7.5 mg by mouth daily.       . metFORMIN (GLUCOPHAGE) 500 MG tablet Take 1 tablet (500 mg total) by mouth 2 (two)  times daily with a meal.  180 tablet  3  . metoprolol (LOPRESSOR) 50 MG tablet Take 25 mg by mouth 2 (two) times daily.       . Multiple Vitamins-Minerals (CENTRUM SILVER ULTRA WOMENS PO) Take 1 capsule by mouth daily.       . nitroGLYCERIN (NITROSTAT) 0.4 MG SL tablet Place 0.4 mg under the tongue every 5 (five) minutes as needed for chest pain.      . potassium chloride (K-DUR) 10 MEQ tablet Take 1 tablet (10 mEq total) by mouth daily.  30 tablet  6  . promethazine (PHENERGAN) 25 MG tablet Take 1 tablet (25 mg total) by mouth every 6 (six) hours as needed for nausea or vomiting.  20 tablet  2  . rivaroxaban (XARELTO) 20 MG TABS tablet Take 1 tablet (20 mg total) by mouth daily with supper.  30 tablet  6  . sertraline (ZOLOFT) 50 MG tablet Take 25 mg by mouth daily.       Marland Kitchen tiotropium (SPIRIVA) 18 MCG inhalation capsule Place 1 capsule (18 mcg total) into inhaler and inhale daily.  30 capsule  0   No facility-administered medications prior to visit.    PHYSICAL EXAM Filed Vitals:   08/01/14 1439  BP: 126/49  Pulse: 76  Height: 5\' 2"  (1.575 m)  Weight: 154 lb (69.854 kg)   Body mass index is 28.16 kg/(m^2).  Visual Acuity Screening   Right eye Left eye Both eyes  Without correction: 20/200 20/50   With correction:      Generalized: Well developed, in no acute distress  Head: normocephalic and atraumatic. Oropharynx benign  Neck: Supple, bilateral carotid bruits  Cardiac: Regular rate rhythm, 3/6 systolic murmur  Musculoskeletal: No deformity   Neurological examination  Mentation: Alert oriented to time, place, history taking. Follows all commands speech and language fluent Cranial nerve II-XII: Fundoscopic exam not done. Pupils were equal round reactive to light extraocular movements were full, visual field were full on confrontational test. Facial sensation and strength were normal. hearing was intact to finger rubbing bilaterally. Uvula tongue midline. head turning and shoulder  shrug and were normal and symmetric.Tongue protrusion into cheek strength was normal. Motor: The motor testing reveals 5 over 5 strength of all 4 extremities. Good symmetric motor tone is noted throughout.  Sensory: Sensory testing is intact to soft touch on all 4 extremities. No evidence of extinction is noted.  Coordination: Cerebellar testing reveals good finger-nose-finger and heel-to-shin bilaterally.  Gait and station: Gait is normal. Tandem gait is normal. Romberg is negative. Reflexes: Deep tendon reflexes are symmetric and normal bilaterally.  NIHSS: 0 MRs: 1   ASSESSMENT: Ms. EIMAN MARET is a 73 y.o. female presenting with unexplained fall with ? Loss of consciousness on 04/09/14. Imaging confirmed a small left pareital embolic infarct likely clinically silent and unrelated to her presentation. Infarct felt to be embolic secondary to atrial fibrillation.  Patient with no resultant deficits. 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 Kellie Simmering.  PLAN: Recommended if continues to have dark stool and weakness, see her PCP to evaluate for fecal occult blood and CBC tomorrow. Continue xarelto ( rivaroxaban)  for atrial fibrillation and secondary stroke prevention and maintain strict control of hypertension with blood pressure goal below 140/90, diabetes with hemoglobin A1c goal below 6.5% and lipids with LDL cholesterol goal below 70 mg/dL. Follow up with vascular surgery on 10/15/14 for your carotid artery disease.  Followup in the future with Dr. Erlinda Hong in 3 months, sooner as needed.  Rudi Rummage Shastina Rua, MSN, FNP-BC, A/GNP-C 08/01/2014, 2:49 PM [facility] [address], [place], [place] [address] [phone]  Note: This document was prepared with digital dictation and possible smart phrase technology. Any transcriptional errors that result from this process are unintentional.

## 2014-08-01 NOTE — ED Notes (Signed)
Xray in room for portable chest xray

## 2014-08-01 NOTE — ED Notes (Signed)
Pt placed on monitor upon arrival to room. Pt monitored by blood pressure, pulse ox, and 12 lead. Pts daughter remains at bedside. Pts EKG given to Dr. Reather Converse.

## 2014-08-01 NOTE — ED Notes (Signed)
Patient placed on bedpan.

## 2014-08-01 NOTE — ED Notes (Signed)
MD at bedside. 

## 2014-08-01 NOTE — ED Provider Notes (Signed)
CSN: [id number]     Arrival date & time    History   First MD Initiated Contact with Patient 08/01/14 2229     Chief Complaint  Patient presents with  . Melena  . Fall     (Consider location/radiation/quality/duration/timing/severity/associated sxs/prior Treatment) HPI [name] 73 y.o. with a pmh of a fib (on xarelto), PVD, CAD s/p CABG, SM, and aortic stenosis presents for concern of dark stool that may have blood in it and lightheadedness and generalized weakness. Symptoms started about 4 days ago and have been worsening. Worsened with standing or rising quickly. No known relieving factors. Patient reports black and sticky stool as well. No BRBPR. No N/V. No abdominal pain. No chest pain, SOB, headache, numbness, tingling or weakness.   Past Medical History  Diagnosis Date  . ANEMIA 08/28/2010  . CAD (coronary artery disease)     a. s/p CABGx4 in 2001.  Marland Kitchen COLONIC POLYPS, HX OF 06/15/2007  . COPD (chronic obstructive pulmonary disease)   . Depressive disorder   . GERD 12/19/2009  . Hyperlipidemia   . Hypertension   . HYPOTENSION 08/11/2010  . Osteoarthritis   . PVD (peripheral vascular disease)     a. Duplex 03/2013: stable moderate carotid dz - 72-53% RICA, 66-44% LICA, L subclavian artery to CCA stent widely patent.  b. Prior R fempop bypass graft, R CIA stent.  . Lumbar stenosis   . Lung abscess 2011  . CHF (congestive heart failure)   . Hx of colonoscopy   . Paroxysmal atrial flutter     a. Dx 01/2014, placed on Xarelto.  . Aortic stenosis     a. Mild by echo 01/2013.  . Diabetes mellitus without complication    Past Surgical History  Procedure Laterality Date  . L subclavian bypass  07/2004  . Tubal ligation    . Coronary artery bypass graft  05/2000  . Femoral popliteal bypass-r  04/1999  . Right common iliac pta with stent placement  07/2000  . Right leg blockage  2003  . Aorta bifemoral bypass graft  2004  . Left cea  2005  . Lung cyst biopsy  2011   Family  History  Problem Relation Age of Onset  . Diabetes Father   . Heart attack Mother   . Diabetes    . Coronary artery disease    . Colon cancer Neg Hx    History  Substance Use Topics  . Smoking status: Former Smoker -- 1.50 packs/day for 40 years    Types: Cigarettes    Quit date: 06/29/2000  . Smokeless tobacco: Never Used  . Alcohol Use: No   OB History   Grav Para Term Preterm Abortions TAB SAB Ect Mult Living                 Review of Systems  Neurological: Positive for weakness.  All other systems reviewed and are negative.     Allergies  Codeine phosphate  Home Medications   Prior to Admission medications   Medication Sig Start Date End Date Taking? Authorizing Provider  amiodarone (PACERONE) 200 MG tablet Take 1 tablet (200 mg total) by mouth daily. 07/03/14  Yes Dorothy Spark, MD  atorvastatin (LIPITOR) 40 MG tablet Take 1 tablet (40 mg total) by mouth every evening. 05/23/14  Yes Dorothy Spark, MD  cilostazol (PLETAL) 100 MG tablet Take 100 mg by mouth 2 (two) times daily.   Yes Historical Provider, MD  diltiazem (CARDIZEM CD)  120 MG 24 hr capsule Take 1 capsule by mouth daily. 03/18/14  Yes Historical Provider, MD  furosemide (LASIX) 40 MG tablet Take 0.5 tablets (20 mg total) by mouth daily. 04/13/14  Yes Adeline Saralyn Pilar, MD  gabapentin (NEURONTIN) 600 MG tablet Take 1 tablet (600 mg total) by mouth 3 (three) times daily. 07/23/13  Yes Marletta Lor, MD  glimepiride (AMARYL) 1 MG tablet Take 1 mg by mouth daily with breakfast.   Yes Historical Provider, MD  HYDROcodone-acetaminophen (NORCO/VICODIN) 5-325 MG per tablet Take 0.5 tablets by mouth every 6 (six) hours as needed for moderate pain. 03/25/14  Yes Marletta Lor, MD  LORazepam (ATIVAN) 0.5 MG tablet Take 0.5 mg by mouth 2 (two) times daily.   Yes Historical Provider, MD  losartan (COZAAR) 50 MG tablet Take 1 tablet (50 mg total) by mouth daily. 07/03/14  Yes Dorothy Spark, MD  meloxicam  (MOBIC) 7.5 MG tablet Take 7.5 mg by mouth daily.  05/22/14  Yes Historical Provider, MD  metFORMIN (GLUCOPHAGE) 500 MG tablet Take 1 tablet (500 mg total) by mouth 2 (two) times daily with a meal. 04/26/14  Yes Marletta Lor, MD  metoprolol (LOPRESSOR) 50 MG tablet Take 25 mg by mouth 2 (two) times daily.  04/13/14  Yes Adeline Saralyn Pilar, MD  Multiple Vitamins-Minerals (CENTRUM SILVER ULTRA WOMENS PO) Take 1 capsule by mouth daily.    Yes Historical Provider, MD  nitroGLYCERIN (NITROSTAT) 0.4 MG SL tablet Place 0.4 mg under the tongue every 5 (five) minutes as needed for chest pain.   Yes Historical Provider, MD  potassium chloride (K-DUR) 10 MEQ tablet Take 1 tablet (10 mEq total) by mouth daily. 03/12/14  Yes Peter M Martinique, MD  promethazine (PHENERGAN) 25 MG tablet Take 1 tablet (25 mg total) by mouth every 6 (six) hours as needed for nausea or vomiting. 03/25/14  Yes Marletta Lor, MD  rivaroxaban (XARELTO) 20 MG TABS tablet Take 1 tablet (20 mg total) by mouth daily with supper. 03/21/14  Yes Dorothy Spark, MD  sertraline (ZOLOFT) 50 MG tablet Take 25 mg by mouth daily.    Yes Historical Provider, MD  tiotropium (SPIRIVA) 18 MCG inhalation capsule Place 1 capsule (18 mcg total) into inhaler and inhale daily. 04/13/14  Yes Adeline C Viyuoh, MD   BP 103/32  Pulse 85  Temp(Src) 99.3 F (37.4 C) (Oral)  Resp 16  Ht 5\' 2"  (1.575 m)  Wt 154 lb (69.854 kg)  BMI 28.16 kg/m2  SpO2 92% Physical Exam  Constitutional: She is oriented to person, place, and time. She appears well-developed and well-nourished. No distress.  HENT:  Head: Normocephalic and atraumatic.  Right Ear: External ear normal.  Left Ear: External ear normal.  Eyes: Conjunctivae and EOM are normal. Right eye exhibits no discharge. Left eye exhibits no discharge.  Neck: Normal range of motion. Neck supple. No JVD present.  Cardiovascular: Normal rate, regular rhythm and normal heart sounds.  Exam reveals no gallop and  no friction rub.   No murmur heard. Pulmonary/Chest: Effort normal and breath sounds normal. No stridor. No respiratory distress. She has no wheezes. She has no rales. She exhibits no tenderness.  Abdominal: Soft. Bowel sounds are normal. She exhibits no distension. There is no tenderness. There is no rigidity, no rebound and no guarding.  Genitourinary: Rectal exam shows anal tone normal. Guaiac positive stool (no gross blood or melena).  Musculoskeletal: Normal range of motion. She exhibits no edema.  Neurological: She is alert and oriented to person, place, and time.  Skin: No rash noted. She is not diaphoretic. There is pallor.  Psychiatric: She has a normal mood and affect. Her behavior is normal.    ED Course  Procedures (including critical care time) Labs Review Labs Reviewed  CBC WITH DIFFERENTIAL - Abnormal; Notable for the following:    RBC 2.41 (*)    Hemoglobin 5.8 (*)    HCT 19.3 (*)    MCH 24.1 (*)    RDW 17.8 (*)    Neutrophils Relative % 81 (*)    Lymphocytes Relative 11 (*)    All other components within normal limits  TROPONIN I - Abnormal; Notable for the following:    Troponin I 2.27 (*)    All other components within normal limits  COMPREHENSIVE METABOLIC PANEL - Abnormal; Notable for the following:    Potassium 3.2 (*)    BUN 34 (*)    Creatinine, Ser 1.55 (*)    Albumin 3.1 (*)    GFR calc non Af Amer 32 (*)    GFR calc Af Amer 37 (*)    Anion gap 18 (*)    All other components within normal limits  PROTIME-INR - Abnormal; Notable for the following:    Prothrombin Time 39.6 (*)    INR 4.08 (*)    All other components within normal limits  I-STAT CHEM 8, ED - Abnormal; Notable for the following:    Sodium 135 (*)    Potassium 3.0 (*)    BUN 33 (*)    Creatinine, Ser 1.60 (*)    Calcium, Ion 1.08 (*)    Hemoglobin 5.4 (*)    HCT 16.0 (*)    All other components within normal limits  I-STAT TROPOININ, ED - Abnormal; Notable for the following:     Troponin i, poc 0.97 (*)    All other components within normal limits  POC OCCULT BLOOD, ED - Abnormal; Notable for the following:    Fecal Occult Bld POSITIVE (*)    All other components within normal limits  I-STAT CG4 LACTIC ACID, ED - Abnormal; Notable for the following:    Lactic Acid, Venous 3.06 (*)    All other components within normal limits  MAGNESIUM  URINALYSIS, ROUTINE W REFLEX MICROSCOPIC  TYPE AND SCREEN  PREPARE RBC (CROSSMATCH)  ABO/RH    Imaging Review Dg Chest Portable 1 View  08/02/2014   CLINICAL DATA:  MELENA FALL  EXAM: PORTABLE CHEST - 1 VIEW  COMPARISON:  04/09/2014  FINDINGS: Previous CABG. Heart size upper limits normal. Patchy interstitial opacities in both lung bases slightly more conspicuous than on prior study. No focal airspace disease. No overt edema. No effusion. Atheromatous aorta.  IMPRESSION: 1. Probable mild bibasilar atelectasis. Otherwise stable appearance since previous exam.   Electronically Signed   By: Arne Cleveland M.D.   On: 08/02/2014 00:55     EKG Interpretation   Date/Time:  Thursday August 01 2014 22:21:11 EDT Ventricular Rate:  79 PR Interval:  171 QRS Duration: 102 QT Interval:  431 QTC Calculation: 494 R Axis:   0 Text Interpretation:  Sinus or ectopic atrial rhythm Repol abnrm, severe  global ischemia (LM/MVD) Confirmed by Reather Converse  MD, JOSHUA ([id number]) on  08/01/2014 10:30:01 PM      MDM   Final diagnoses:  Acute GI bleeding  Symptomatic anemia  Lightheadedness  EKG abnormalities  Dyspnea  Renal failure (ARF), acute on chronic  Pt who is on xarelto presents with melena, lightheadedness, and weakness. + hemoccult. HDS. abd exam not suggestive of peritonitis. Hgb 5.6. WIll transfuse 2 units. GI consulted and will evaluate tonight. WIll admit to the step down unit. Care discussed with my attending, Dr. Reather Converse. No other evidence of bleeding.     Kelby Aline, MD 08/02/14 [phone]

## 2014-08-01 NOTE — Patient Instructions (Addendum)
Continue xarelto ( rivaroxaban)  for secondary stroke prevention and maintain strict control of hypertension with blood pressure goal below 140/90, diabetes with hemoglobin A1c goal below 6.5% and lipids with LDL cholesterol goal below 70 mg/dL. Follow up with vascular surgery on 10/15/14 for your carotid artery disease.  Followup in the future with Dr. Erlinda Hong in 3 months, sooner as needed.  Stroke Prevention Some medical conditions and behaviors are associated with an increased chance of having a stroke. You may prevent a stroke by making healthy choices and managing medical conditions. HOW CAN I REDUCE MY RISK OF HAVING A STROKE?   Stay physically active. Get at least 30 minutes of activity on most or all days.  Do not smoke. It may also be helpful to avoid exposure to secondhand smoke.  Limit alcohol use. Moderate alcohol use is considered to be:  No more than 2 drinks per day for men.  No more than 1 drink per day for nonpregnant women.  Eat healthy foods. This involves:  Eating 5 or more servings of fruits and vegetables a day.  Making dietary changes that address high blood pressure (hypertension), high cholesterol, diabetes, or obesity.  Manage your cholesterol levels.  Making food choices that are high in fiber and low in saturated fat, trans fat, and cholesterol may control cholesterol levels.  Take any prescribed medicines to control cholesterol as directed by your health care provider.  Manage your diabetes.  Controlling your carbohydrate and sugar intake is recommended to manage diabetes.  Take any prescribed medicines to control diabetes as directed by your health care provider.  Control your hypertension.  Making food choices that are low in salt (sodium), saturated fat, trans fat, and cholesterol is recommended to manage hypertension.  Take any prescribed medicines to control hypertension as directed by your health care provider.  Maintain a healthy  weight.  Reducing calorie intake and making food choices that are low in sodium, saturated fat, trans fat, and cholesterol are recommended to manage weight.  Stop drug abuse.  Avoid taking birth control pills.  Talk to your health care provider about the risks of taking birth control pills if you are over 88 years old, smoke, get migraines, or have ever had a blood clot.  Get evaluated for sleep disorders (sleep apnea).  Talk to your health care provider about getting a sleep evaluation if you snore a lot or have excessive sleepiness.  Take medicines only as directed by your health care provider.  For some people, aspirin or blood thinners (anticoagulants) are helpful in reducing the risk of forming abnormal blood clots that can lead to stroke. If you have the irregular heart rhythm of atrial fibrillation, you should be on a blood thinner unless there is a good reason you cannot take them.  Understand all your medicine instructions.  Make sure that other conditions (such as anemia or atherosclerosis) are addressed. SEEK IMMEDIATE MEDICAL CARE IF:   You have sudden weakness or numbness of the face, arm, or leg, especially on one side of the body.  Your face or eyelid droops to one side.  You have sudden confusion.  You have trouble speaking (aphasia) or understanding.  You have sudden trouble seeing in one or both eyes.  You have sudden trouble walking.  You have dizziness.  You have a loss of balance or coordination.  You have a sudden, severe headache with no known cause.  You have new chest pain or an irregular heartbeat. Any of these symptoms  may represent a serious problem that is an emergency. Do not wait to see if the symptoms will go away. Get medical help at once. Call your local emergency services (911 in [place] Do not drive yourself to the hospital. Document Released: 12/23/2004 Document Revised: 04/01/2014 Document Reviewed: 05/18/2013 [facility] Patient  Information 2015 [place], [place]. This information is not intended to replace advice given to you by your health care provider. Make sure you discuss any questions you have with your health care provider.

## 2014-08-02 ENCOUNTER — Inpatient Hospital Stay (HOSPITAL_COMMUNITY): Payer: Medicare Other

## 2014-08-02 ENCOUNTER — Encounter (HOSPITAL_COMMUNITY): Payer: Self-pay | Admitting: Internal Medicine

## 2014-08-02 ENCOUNTER — Ambulatory Visit: Payer: Medicare Other | Admitting: Internal Medicine

## 2014-08-02 DIAGNOSIS — K449 Diaphragmatic hernia without obstruction or gangrene: Secondary | ICD-10-CM | POA: Diagnosis present

## 2014-08-02 DIAGNOSIS — E785 Hyperlipidemia, unspecified: Secondary | ICD-10-CM | POA: Diagnosis present

## 2014-08-02 DIAGNOSIS — K5521 Angiodysplasia of colon with hemorrhage: Secondary | ICD-10-CM | POA: Diagnosis present

## 2014-08-02 DIAGNOSIS — I248 Other forms of acute ischemic heart disease: Secondary | ICD-10-CM | POA: Diagnosis present

## 2014-08-02 DIAGNOSIS — I129 Hypertensive chronic kidney disease with stage 1 through stage 4 chronic kidney disease, or unspecified chronic kidney disease: Secondary | ICD-10-CM | POA: Diagnosis present

## 2014-08-02 DIAGNOSIS — I70209 Unspecified atherosclerosis of native arteries of extremities, unspecified extremity: Secondary | ICD-10-CM | POA: Diagnosis present

## 2014-08-02 DIAGNOSIS — E876 Hypokalemia: Secondary | ICD-10-CM | POA: Diagnosis not present

## 2014-08-02 DIAGNOSIS — Z8249 Family history of ischemic heart disease and other diseases of the circulatory system: Secondary | ICD-10-CM | POA: Diagnosis not present

## 2014-08-02 DIAGNOSIS — D649 Anemia, unspecified: Secondary | ICD-10-CM

## 2014-08-02 DIAGNOSIS — B3781 Candidal esophagitis: Secondary | ICD-10-CM | POA: Diagnosis present

## 2014-08-02 DIAGNOSIS — Z7901 Long term (current) use of anticoagulants: Secondary | ICD-10-CM | POA: Diagnosis not present

## 2014-08-02 DIAGNOSIS — M1990 Unspecified osteoarthritis, unspecified site: Secondary | ICD-10-CM | POA: Diagnosis present

## 2014-08-02 DIAGNOSIS — Z951 Presence of aortocoronary bypass graft: Secondary | ICD-10-CM | POA: Diagnosis not present

## 2014-08-02 DIAGNOSIS — Z6828 Body mass index (BMI) 28.0-28.9, adult: Secondary | ICD-10-CM | POA: Diagnosis not present

## 2014-08-02 DIAGNOSIS — Z8673 Personal history of transient ischemic attack (TIA), and cerebral infarction without residual deficits: Secondary | ICD-10-CM | POA: Diagnosis not present

## 2014-08-02 DIAGNOSIS — D62 Acute posthemorrhagic anemia: Secondary | ICD-10-CM | POA: Diagnosis present

## 2014-08-02 DIAGNOSIS — I4891 Unspecified atrial fibrillation: Secondary | ICD-10-CM | POA: Diagnosis present

## 2014-08-02 DIAGNOSIS — I4892 Unspecified atrial flutter: Secondary | ICD-10-CM | POA: Diagnosis present

## 2014-08-02 DIAGNOSIS — J449 Chronic obstructive pulmonary disease, unspecified: Secondary | ICD-10-CM | POA: Diagnosis present

## 2014-08-02 DIAGNOSIS — Z833 Family history of diabetes mellitus: Secondary | ICD-10-CM | POA: Diagnosis not present

## 2014-08-02 DIAGNOSIS — I2510 Atherosclerotic heart disease of native coronary artery without angina pectoris: Secondary | ICD-10-CM | POA: Diagnosis present

## 2014-08-02 DIAGNOSIS — Z791 Long term (current) use of non-steroidal anti-inflammatories (NSAID): Secondary | ICD-10-CM | POA: Diagnosis not present

## 2014-08-02 DIAGNOSIS — F329 Major depressive disorder, single episode, unspecified: Secondary | ICD-10-CM | POA: Diagnosis present

## 2014-08-02 DIAGNOSIS — F3289 Other specified depressive episodes: Secondary | ICD-10-CM | POA: Diagnosis present

## 2014-08-02 DIAGNOSIS — Z885 Allergy status to narcotic agent status: Secondary | ICD-10-CM | POA: Diagnosis not present

## 2014-08-02 DIAGNOSIS — I2489 Other forms of acute ischemic heart disease: Secondary | ICD-10-CM | POA: Diagnosis present

## 2014-08-02 DIAGNOSIS — Z9181 History of falling: Secondary | ICD-10-CM | POA: Diagnosis not present

## 2014-08-02 DIAGNOSIS — N179 Acute kidney failure, unspecified: Secondary | ICD-10-CM | POA: Diagnosis present

## 2014-08-02 DIAGNOSIS — D500 Iron deficiency anemia secondary to blood loss (chronic): Secondary | ICD-10-CM | POA: Diagnosis present

## 2014-08-02 DIAGNOSIS — K219 Gastro-esophageal reflux disease without esophagitis: Secondary | ICD-10-CM | POA: Diagnosis present

## 2014-08-02 DIAGNOSIS — K922 Gastrointestinal hemorrhage, unspecified: Secondary | ICD-10-CM | POA: Diagnosis present

## 2014-08-02 DIAGNOSIS — E119 Type 2 diabetes mellitus without complications: Secondary | ICD-10-CM | POA: Diagnosis present

## 2014-08-02 DIAGNOSIS — K921 Melena: Secondary | ICD-10-CM | POA: Diagnosis present

## 2014-08-02 DIAGNOSIS — N189 Chronic kidney disease, unspecified: Secondary | ICD-10-CM | POA: Diagnosis present

## 2014-08-02 DIAGNOSIS — Z87891 Personal history of nicotine dependence: Secondary | ICD-10-CM | POA: Diagnosis not present

## 2014-08-02 DIAGNOSIS — D126 Benign neoplasm of colon, unspecified: Secondary | ICD-10-CM | POA: Diagnosis present

## 2014-08-02 DIAGNOSIS — K5730 Diverticulosis of large intestine without perforation or abscess without bleeding: Secondary | ICD-10-CM | POA: Diagnosis present

## 2014-08-02 DIAGNOSIS — I509 Heart failure, unspecified: Secondary | ICD-10-CM | POA: Diagnosis present

## 2014-08-02 LAB — URINALYSIS, ROUTINE W REFLEX MICROSCOPIC
Bilirubin Urine: NEGATIVE
GLUCOSE, UA: NEGATIVE mg/dL
HGB URINE DIPSTICK: NEGATIVE
Ketones, ur: NEGATIVE mg/dL
LEUKOCYTES UA: NEGATIVE
Nitrite: NEGATIVE
PH: 6.5 (ref 5.0–8.0)
PROTEIN: NEGATIVE mg/dL
Specific Gravity, Urine: 1.008 (ref 1.005–1.030)
Urobilinogen, UA: 1 mg/dL (ref 0.0–1.0)

## 2014-08-02 LAB — CBC WITH DIFFERENTIAL/PLATELET
Basophils Absolute: 0 10*3/uL (ref 0.0–0.1)
Basophils Relative: 0 % (ref 0–1)
EOS ABS: 0.1 10*3/uL (ref 0.0–0.7)
EOS PCT: 1 % (ref 0–5)
HCT: 26.4 % — ABNORMAL LOW (ref 36.0–46.0)
HEMOGLOBIN: 8.2 g/dL — AB (ref 12.0–15.0)
LYMPHS ABS: 1 10*3/uL (ref 0.7–4.0)
Lymphocytes Relative: 9 % — ABNORMAL LOW (ref 12–46)
MCH: 25.9 pg — AB (ref 26.0–34.0)
MCHC: 31.1 g/dL (ref 30.0–36.0)
MCV: 83.3 fL (ref 78.0–100.0)
MONOS PCT: 7 % (ref 3–12)
Monocytes Absolute: 0.8 10*3/uL (ref 0.1–1.0)
Neutro Abs: 9.3 10*3/uL — ABNORMAL HIGH (ref 1.7–7.7)
Neutrophils Relative %: 84 % — ABNORMAL HIGH (ref 43–77)
Platelets: 214 10*3/uL (ref 150–400)
RBC: 3.17 MIL/uL — ABNORMAL LOW (ref 3.87–5.11)
RDW: 18.2 % — ABNORMAL HIGH (ref 11.5–15.5)
WBC: 11.1 10*3/uL — ABNORMAL HIGH (ref 4.0–10.5)

## 2014-08-02 LAB — COMPREHENSIVE METABOLIC PANEL
ALBUMIN: 3.1 g/dL — AB (ref 3.5–5.2)
ALT: 19 U/L (ref 0–35)
ALT: 19 U/L (ref 0–35)
AST: 26 U/L (ref 0–37)
AST: 32 U/L (ref 0–37)
Albumin: 3 g/dL — ABNORMAL LOW (ref 3.5–5.2)
Alkaline Phosphatase: 72 U/L (ref 39–117)
Alkaline Phosphatase: 72 U/L (ref 39–117)
Anion gap: 18 — ABNORMAL HIGH (ref 5–15)
Anion gap: 15 (ref 5–15)
BUN: 34 mg/dL — ABNORMAL HIGH (ref 6–23)
BUN: 27 mg/dL — ABNORMAL HIGH (ref 6–23)
CALCIUM: 8.4 mg/dL (ref 8.4–10.5)
CO2: 22 meq/L (ref 19–32)
CO2: 21 mEq/L (ref 19–32)
Calcium: 8.2 mg/dL — ABNORMAL LOW (ref 8.4–10.5)
Chloride: 97 mEq/L (ref 96–112)
Chloride: 103 mEq/L (ref 96–112)
Creatinine, Ser: 1.55 mg/dL — ABNORMAL HIGH (ref 0.50–1.10)
Creatinine, Ser: 1.32 mg/dL — ABNORMAL HIGH (ref 0.50–1.10)
GFR calc Af Amer: 37 mL/min — ABNORMAL LOW (ref >90)
GFR calc Af Amer: 45 mL/min — ABNORMAL LOW (ref >90)
GFR calc non Af Amer: 39 mL/min — ABNORMAL LOW (ref >90)
GFR, EST NON AFRICAN AMERICAN: 32 mL/min — AB (ref >90)
Glucose, Bld: 79 mg/dL (ref 70–99)
Glucose, Bld: 73 mg/dL (ref 70–99)
Potassium: 3.2 mEq/L — ABNORMAL LOW (ref 3.7–5.3)
Potassium: 3.6 mEq/L — ABNORMAL LOW (ref 3.7–5.3)
SODIUM: 137 meq/L (ref 137–147)
SODIUM: 139 meq/L (ref 137–147)
Total Bilirubin: 0.3 mg/dL (ref 0.3–1.2)
Total Bilirubin: 1.4 mg/dL — ABNORMAL HIGH (ref 0.3–1.2)
Total Protein: 6.3 g/dL (ref 6.0–8.3)
Total Protein: 6 g/dL (ref 6.0–8.3)

## 2014-08-02 LAB — GLUCOSE, CAPILLARY
GLUCOSE-CAPILLARY: 78 mg/dL (ref 70–99)
GLUCOSE-CAPILLARY: 115 mg/dL — AB (ref 70–99)
GLUCOSE-CAPILLARY: 134 mg/dL — AB (ref 70–99)
Glucose-Capillary: 124 mg/dL — ABNORMAL HIGH (ref 70–99)
Glucose-Capillary: 81 mg/dL (ref 70–99)
Glucose-Capillary: 99 mg/dL (ref 70–99)

## 2014-08-02 LAB — CBC
HCT: 26.4 % — ABNORMAL LOW (ref 36.0–46.0)
Hemoglobin: 8.6 g/dL — ABNORMAL LOW (ref 12.0–15.0)
MCH: 26.4 pg (ref 26.0–34.0)
MCHC: 32.6 g/dL (ref 30.0–36.0)
MCV: 81 fL (ref 78.0–100.0)
PLATELETS: 210 10*3/uL (ref 150–400)
RBC: 3.26 MIL/uL — ABNORMAL LOW (ref 3.87–5.11)
RDW: 18.3 % — AB (ref 11.5–15.5)
WBC: 12 10*3/uL — ABNORMAL HIGH (ref 4.0–10.5)

## 2014-08-02 LAB — ABO/RH: ABO/RH(D): O POS

## 2014-08-02 LAB — LACTIC ACID, PLASMA: LACTIC ACID, VENOUS: 1.4 mmol/L (ref 0.5–2.2)

## 2014-08-02 LAB — PREPARE RBC (CROSSMATCH)

## 2014-08-02 LAB — MAGNESIUM: Magnesium: 1.7 mg/dL (ref 1.5–2.5)

## 2014-08-02 LAB — TROPONIN I: Troponin I: 4.13 ng/mL (ref <0.30)

## 2014-08-02 LAB — MRSA PCR SCREENING: MRSA BY PCR: NEGATIVE

## 2014-08-02 MED ORDER — ACETAMINOPHEN 325 MG PO TABS
650.0000 mg | ORAL_TABLET | Freq: Four times a day (QID) | ORAL | Status: DC | PRN
  Administered 2014-08-02: 650 mg via ORAL
  Filled 2014-08-02: qty 2

## 2014-08-02 MED ORDER — PANTOPRAZOLE SODIUM 40 MG IV SOLR
40.0000 mg | Freq: Two times a day (BID) | INTRAVENOUS | Status: DC
  Administered 2014-08-05 (×2): 40 mg via INTRAVENOUS
  Filled 2014-08-02 (×4): qty 40

## 2014-08-02 MED ORDER — PHYTONADIONE 5 MG PO TABS
5.0000 mg | ORAL_TABLET | Freq: Every day | ORAL | Status: AC
  Administered 2014-08-02 – 2014-08-04 (×2): 5 mg via ORAL
  Filled 2014-08-02 (×3): qty 1

## 2014-08-02 MED ORDER — LORAZEPAM 0.5 MG PO TABS
0.5000 mg | ORAL_TABLET | Freq: Every evening | ORAL | Status: DC | PRN
Start: 2014-08-02 — End: 2014-08-07
  Administered 2014-08-03 – 2014-08-06 (×4): 0.5 mg via ORAL
  Filled 2014-08-02 (×5): qty 1

## 2014-08-02 MED ORDER — TIOTROPIUM BROMIDE MONOHYDRATE 18 MCG IN CAPS
18.0000 ug | ORAL_CAPSULE | Freq: Every day | RESPIRATORY_TRACT | Status: DC
  Administered 2014-08-02 – 2014-08-07 (×5): 18 ug via RESPIRATORY_TRACT
  Filled 2014-08-02 (×2): qty 5

## 2014-08-02 MED ORDER — SODIUM CHLORIDE 0.9 % IV SOLN
8.0000 mg/h | INTRAVENOUS | Status: AC
  Administered 2014-08-02 – 2014-08-04 (×3): 8 mg/h via INTRAVENOUS
  Filled 2014-08-02 (×13): qty 80

## 2014-08-02 MED ORDER — INSULIN ASPART 100 UNIT/ML ~~LOC~~ SOLN
0.0000 [IU] | Freq: Three times a day (TID) | SUBCUTANEOUS | Status: DC
  Administered 2014-08-05: 2 [IU] via SUBCUTANEOUS
  Administered 2014-08-06 – 2014-08-07 (×2): 1 [IU] via SUBCUTANEOUS

## 2014-08-02 MED ORDER — SODIUM CHLORIDE 0.9 % IV SOLN: INTRAVENOUS | Status: DC

## 2014-08-02 MED ORDER — SODIUM CHLORIDE 0.9 % IV SOLN
INTRAVENOUS | Status: AC
  Administered 2014-08-02: 02:00:00 via INTRAVENOUS

## 2014-08-02 MED ORDER — ACETAMINOPHEN 650 MG RE SUPP
650.0000 mg | Freq: Four times a day (QID) | RECTAL | Status: DC | PRN
Start: 2014-08-02 — End: 2014-08-07

## 2014-08-02 MED ORDER — ONDANSETRON HCL 4 MG PO TABS
4.0000 mg | ORAL_TABLET | Freq: Four times a day (QID) | ORAL | Status: DC | PRN
  Administered 2014-08-04 (×2): 4 mg via ORAL
  Filled 2014-08-02 (×2): qty 1

## 2014-08-02 MED ORDER — FUROSEMIDE 10 MG/ML IJ SOLN
40.0000 mg | Freq: Once | INTRAMUSCULAR | Status: AC
  Administered 2014-08-02: 40 mg via INTRAVENOUS
  Filled 2014-08-02 (×2): qty 4

## 2014-08-02 MED ORDER — ALBUTEROL SULFATE (2.5 MG/3ML) 0.083% IN NEBU
2.5000 mg | INHALATION_SOLUTION | Freq: Four times a day (QID) | RESPIRATORY_TRACT | Status: DC | PRN
  Administered 2014-08-02 – 2014-08-03 (×2): 2.5 mg via RESPIRATORY_TRACT
  Filled 2014-08-02 (×2): qty 3

## 2014-08-02 MED ORDER — ONDANSETRON HCL 4 MG/2ML IJ SOLN: 4.0000 mg | Freq: Four times a day (QID) | INTRAMUSCULAR | Status: DC | PRN

## 2014-08-02 MED ORDER — EMPTY CONTAINERS FLEXIBLE MISC
3192.0000 [IU] | Status: DC
  Filled 2014-08-02: qty 128

## 2014-08-02 MED ORDER — NITROGLYCERIN 0.4 MG SL SUBL: 0.4000 mg | SUBLINGUAL_TABLET | SUBLINGUAL | Status: DC | PRN

## 2014-08-02 MED ORDER — ONDANSETRON HCL 4 MG/2ML IJ SOLN: 4.0000 mg | Freq: Three times a day (TID) | INTRAMUSCULAR | Status: DC | PRN

## 2014-08-02 MED ORDER — PANTOPRAZOLE SODIUM 40 MG IV SOLR
80.0000 mg | Freq: Once | INTRAVENOUS | Status: AC
  Administered 2014-08-02: 80 mg via INTRAVENOUS
  Filled 2014-08-02: qty 80

## 2014-08-02 NOTE — Progress Notes (Signed)
Pt seen and evaluated earlier this AM. Please refer to H and P for details regarding assessment and plan. Pt is s/p 2 units of PRBC's which have been transfused.  Reportedly had cbc redrawn this morning after the 2 units were transfused.  Per my associates notes GI consulted.  Will reassess next am  Lucely Leard, Linward Foster

## 2014-08-02 NOTE — Progress Notes (Signed)
Pt's BPs 80-90s/30-40s MAP = 49. MD notified. Also notified MD that pt has exp wheezes throughout. New orders received for STAT pCXR. Will continue to monitor.

## 2014-08-02 NOTE — Progress Notes (Signed)
Pt c/o SOB, Lungs diminished, inspiratory wheezing LLL, Resp. Therapist notified for breathing treatment. Pt still c/o SOB not relieved with breathing treatment, o2 sat 94% on 4L, BP 131/48. HR 81, RR 22. Wendee Beavers, MD notified. New orders for lasix 40mg  IV, and d/c NS infusion. Will continue to monitor pt.

## 2014-08-02 NOTE — Consult Note (Signed)
[facility] Consult: 8:35 AM 08/02/2014  LOS: 1 day    Referring Provider: Rise Patience.   Primary Care Physician:  Nyoka Cowden, MD Primary Gastroenterologist:  Dr. Fuller Plan     Reason for Consultation:  Melena, anemia.   Pt on Xarelto.    HPI: [name] is a 73 y.o. female.  PMH CAD, s/p CABG, atrial fibrillation on xarelto, COPD, hypertension, DM2 (oral agents), peripheral vascular disease, s/p LE revascularization surgeries/CEA, on Pletal.  Hx anemia.  Meds in clude Mobic but no PPI or H2 blocker.  No ASA since lat March 2015.   Admission 3/25 - 3/26 with rapid a fib.  Admission 5/12 -5/16 for syncope. 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 Xarelto was continued. Diagnoses of DM2 was new too. Amiodarone added for rapid A fib.    Several falls at home in recent days. Just loses her balance and falls, not dizzy or having lightheadedness.   For at least one month stools dark but formed, she attributed this to starting to eat prunes as reccd by MD.  This past Wed, 9/2 stools more melenic, looser and more black.  Sh has increased dyspnea but no anorexia, no nausea, no abdominal pain.   In ED late last light: Hgb 5.4, MCV 80.  Baseline of 03/2024 of 9 - 11.0. Coagulopathic: PT/INR 39/4.  BUN 34, previously normal but creatinine also elevated: 1.6.  EGD and colonoscopy in 2011: colon polyps and HH.     ENDOSCOPIC STUDIES: 09/2010  Colonoscopy  Lucio Edward MD INDICATIONS: 1) surveillance and high-risk screening 2) history  of adenomatous colon polyps: 2000 ENDOSCOPIC IMPRESSION:  1) 3 mm sessile polyp  2) 6 mm sessile polyp at the hepatic flexure  3) 4 mm sessile polyp in the  sigmoid colon  4) 3 - 4 mm, three polyps in the sigmoid colon  RECOMMENDATIONS:  1) Await pathology results : serrated adenomas and hyperplastic polyps, no HGD/malignancy.  2) Repeat Colonoscopy in 5 years pending pathology review.  09/2010 EGD INDICATIONS: iron deficiency anemia, GERD ENDOSCOPIC IMPRESSION:  1) 3 cm hiatal hernia  RECOMMENDATIONS:  1) Anti-reflux regimen long term  2) PPI qam  Past Medical History  Diagnosis Date  . ANEMIA 08/28/2010  . CAD (coronary artery disease)     a. s/p CABGx4 in 2001.  Marland Kitchen COLONIC POLYPS, HX OF 06/15/2007  . COPD (chronic obstructive pulmonary disease)   . Depressive disorder   . GERD 12/19/2009  . Hyperlipidemia   . Hypertension   . HYPOTENSION 08/11/2010  . Osteoarthritis   . PVD (peripheral vascular disease)     a. Duplex 03/2013: stable moderate carotid dz - 95-18% RICA, 84-16% LICA, L subclavian artery to CCA stent widely patent.  b. Prior R fempop bypass graft, R CIA stent.  . Lumbar stenosis   . Lung abscess 2011  . CHF (congestive heart failure)   . Hx of colonoscopy   . Paroxysmal atrial flutter     a.  Dx 01/2014, placed on Xarelto.  . Aortic stenosis     a. Mild by echo 01/2013.  . Diabetes mellitus without complication     Past Surgical History  Procedure Laterality Date  . L subclavian bypass  07/2004  . Tubal ligation    . Coronary artery bypass graft  05/2000  . Femoral popliteal bypass-r  04/1999  . Right common iliac pta with stent placement  07/2000  . Right leg blockage  2003  . Aorta bifemoral bypass graft  2004  . Left cea  2005  . Lung cyst biopsy  2011    Prior to Admission medications   Medication Sig Start Date End Date Taking? Authorizing Provider  amiodarone (PACERONE) 200 MG tablet Take 1 tablet (200 mg total) by mouth daily. 07/03/14  Yes Dorothy Spark, MD  atorvastatin (LIPITOR) 40 MG tablet Take 1 tablet (40 mg total) by mouth every evening. 05/23/14  Yes Dorothy Spark, MD  cilostazol (PLETAL)  100 MG tablet Take 100 mg by mouth 2 (two) times daily.   Yes Historical Provider, MD  diltiazem (CARDIZEM CD) 120 MG 24 hr capsule Take 1 capsule by mouth daily. 03/18/14  Yes Historical Provider, MD  furosemide (LASIX) 40 MG tablet Take 0.5 tablets (20 mg total) by mouth daily. 04/13/14  Yes Adeline Saralyn Pilar, MD  gabapentin (NEURONTIN) 600 MG tablet Take 1 tablet (600 mg total) by mouth 3 (three) times daily. 07/23/13  Yes Marletta Lor, MD  glimepiride (AMARYL) 1 MG tablet Take 1 mg by mouth daily with breakfast.   Yes Historical Provider, MD  HYDROcodone-acetaminophen (NORCO/VICODIN) 5-325 MG per tablet Take 0.5 tablets by mouth every 6 (six) hours as needed for moderate pain. 03/25/14  Yes Marletta Lor, MD  LORazepam (ATIVAN) 0.5 MG tablet Take 0.5 mg by mouth 2 (two) times daily.   Yes Historical Provider, MD  losartan (COZAAR) 50 MG tablet Take 1 tablet (50 mg total) by mouth daily. 07/03/14  Yes Dorothy Spark, MD  meloxicam (MOBIC) 7.5 MG tablet Take 7.5 mg by mouth daily.  05/22/14  Yes Historical Provider, MD  metFORMIN (GLUCOPHAGE) 500 MG tablet Take 1 tablet (500 mg total) by mouth 2 (two) times daily with a meal. 04/26/14  Yes Marletta Lor, MD  metoprolol (LOPRESSOR) 50 MG tablet Take 25 mg by mouth 2 (two) times daily.  04/13/14  Yes Adeline Saralyn Pilar, MD  Multiple Vitamins-Minerals (CENTRUM SILVER ULTRA WOMENS PO) Take 1 capsule by mouth daily.    Yes Historical Provider, MD  nitroGLYCERIN (NITROSTAT) 0.4 MG SL tablet Place 0.4 mg under the tongue every 5 (five) minutes as needed for chest pain.   Yes Historical Provider, MD  potassium chloride (K-DUR) 10 MEQ tablet Take 1 tablet (10 mEq total) by mouth daily. 03/12/14  Yes Peter M Martinique, MD  promethazine (PHENERGAN) 25 MG tablet Take 1 tablet (25 mg total) by mouth every 6 (six) hours as needed for nausea or vomiting. 03/25/14  Yes Marletta Lor, MD  rivaroxaban (XARELTO) 20 MG TABS tablet Take 1 tablet (20 mg total)  by mouth daily with supper. 03/21/14  Yes Dorothy Spark, MD  sertraline (ZOLOFT) 50 MG tablet Take 25 mg by mouth daily.    Yes Historical Provider, MD  tiotropium (SPIRIVA) 18 MCG inhalation capsule Place 1 capsule (18 mcg total) into inhaler and inhale daily. 04/13/14  Yes Sheila Oats, MD    Scheduled Meds: . sodium chloride   Intravenous  STAT  . insulin aspart  0-9 Units Subcutaneous TID WC  . [START ON 08/05/2014] pantoprazole (PROTONIX) IV  40 mg Intravenous Q12H  . tiotropium  18 mcg Inhalation Daily   Infusions: . sodium chloride Stopped (08/02/14 0717)  . pantoprozole (PROTONIX) infusion 8 mg/hr (08/02/14 0800)   PRN Meds: acetaminophen, acetaminophen, nitroGLYCERIN, ondansetron (ZOFRAN) IV, ondansetron   Allergies as of 08/01/2014 - Review Complete 08/01/2014  Allergen Reaction Noted  . Codeine phosphate  01/25/2007    Family History  Problem Relation Age of Onset  . Diabetes Father   . Heart attack Mother   . Diabetes    . Coronary artery disease    . Colon cancer Neg Hx     History   Social History  . Marital Status: Widowed    Spouse Name: N/A    Number of Children: 2  . Years of Education: N/A   Occupational History  .     Social History Main Topics  . Smoking status: Former Smoker -- 1.50 packs/day for 40 years    Types: Cigarettes    Quit date: 06/29/2000  . Smokeless tobacco: Never Used  . Alcohol Use: No  . Drug Use: No  . Sexual Activity: Not on file   Other Topics Concern  . Not on file   Social History Narrative  . No narrative on file    REVIEW OF SYSTEMS: Constitutional:  Neg for weakness.  Weight drop of 8 # since 01/2014.  ENT:  No nose bleeds Pulm:  Stable DOE but wheezing, rattling increased since admission CV:  No palpitations, no LE edema.  GU:  No hematuria, no frequency GI:  Per HPI.  No dysphagia Heme: no po iron since child bearing years.  No unusual bleeding or bruising   Transfusions:  In 2011 at time of lung  abcess Neuro:  No headaches, no peripheral tingling or numbness Derm:+ for reddness in left toes,  Feet tend to itch. no rash or sores.  Endocrine:  No sweats or chills.  No polyuria or dysuria Immunization:  Not queried.  Travel:  None beyond local counties in last few months.    PHYSICAL EXAM: Vital signs in last 24 hours: Filed Vitals:   08/02/14 0800  BP: 123/36  Pulse: 83  Temp:   Resp: 16   Wt Readings from Last 3 Encounters:  08/02/14 71.9 kg (158 lb 8.2 oz)  08/01/14 69.854 kg (154 lb)  07/03/14 68.04 kg (150 lb)    General: pleasant, alert.  Looks well.  Head:  No asymmetry or trauma  Eyes:  No icterus or pallor Ears:  + HOH  Nose:  No discharge or congestion Mouth:  Clear and moist.  Neck:  No mass, no JVD, no TMG Lungs:  Rough BS throughout.  Upper airway wheezing.  No dyspnea with speech or at rest.  Heart: RRR with occasional PVC Abdomen:  Soft, NT, ND.  Active BS, no mass, no HSM.   Rectal: black, formed stool.  External rrhoids, no internal masses   Musc/Skeltl: no joint deformity Extremities:  No pedal edema  Neurologic:  Oriented x3.  No tremor, no limb weakness.   Skin:  No telangectasia, no bruising or other signs of trauma Tattoos:  none Nodes:  No cervical adenopathy   Psych:  Pleasant, relaxed, cooperative  Intake/Output from previous day: 09/03 0701 - 09/04 0700 In: 212.5 [I.V.:100; Blood:12.5; IV Piggyback:100] Out: 200 [Urine:200] Intake/Output this shift: Total I/O In: 50 [I.V.:50] Out: 800 [Urine:800]  LAB RESULTS:  Recent Labs  08/01/14 2257 08/01/14 2305  WBC 9.6  --   HGB 5.8* 5.4*  HCT 19.3* 16.0*  PLT 242  --    BMET Lab Results  Component Value Date   NA 135* 08/01/2014   NA 137 08/01/2014   NA 145 04/13/2014   K 3.0* 08/01/2014   K 3.2* 08/01/2014   K 4.2 04/13/2014   CL 98 08/01/2014   CL 97 08/01/2014   CL 106 04/13/2014   CO2 22 08/01/2014   CO2 25 04/13/2014   CO2 24 04/12/2014   GLUCOSE 81 08/01/2014   GLUCOSE 79 08/01/2014    GLUCOSE 96 04/13/2014   BUN 33* 08/01/2014   BUN 34* 08/01/2014   BUN 17 04/13/2014   CREATININE 1.60* 08/01/2014   CREATININE 1.55* 08/01/2014   CREATININE 1.05 04/13/2014   CALCIUM 8.4 08/01/2014   CALCIUM 9.6 04/13/2014   CALCIUM 9.5 04/12/2014   LFT  Recent Labs  08/01/14 2257  PROT 6.3  ALBUMIN 3.1*  AST 26  ALT 19  ALKPHOS 72  BILITOT 0.3   PT/INR Lab Results  Component Value Date   INR 4.08* 08/01/2014   INR 1.4* 03/08/2014   INR 1.12 08/21/2010    RADIOLOGY STUDIES: Dg Chest Port 1 View  08/02/2014   CLINICAL DATA:  Shortness of Breath  EXAM: PORTABLE CHEST - 1 VIEW  COMPARISON:  08/01/2014  FINDINGS: Prior CABG. Heart is borderline in size. Diffuse interstitial prominence throughout the lungs, increased since prior study, likely interstitial edema. No confluent opacities or effusions. No acute bony abnormality.  IMPRESSION: Increasing interstitial prominence, likely interstitial edema.   Electronically Signed   By: Rolm Baptise M.D.   On: 08/02/2014 07:11   Dg Chest Portable 1 View  08/02/2014   CLINICAL DATA:  MELENA FALL  EXAM: PORTABLE CHEST - 1 VIEW  COMPARISON:  04/09/2014  FINDINGS: Previous CABG. Heart size upper limits normal. Patchy interstitial opacities in both lung bases slightly more conspicuous than on prior study. No focal airspace disease. No overt edema. No effusion. Atheromatous aorta.  IMPRESSION: 1. Probable mild bibasilar atelectasis. Otherwise stable appearance since previous exam.   Electronically Signed   By: Arne Cleveland M.D.   On: 08/02/2014 00:55    IMPRESSION:   *  Upper GIB.  Suspect Mobic has caused ulcers.  On PPI drip now, no PPI etc PTA.    *  ABL anemia.  Hx of chronic anemia.  S/p 2 units PRBCs  *  Chronic Xarelto.  coags prolonged: too high to pursue EGD today.   *  AKI.  Additional cause of elevated BUN may be UGIB.   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   *  Clears.  NPO post midnight for EGD.  *  q 12 CBC.   *  Continue the PPI drip.    Azucena Freed  08/02/2014, 8:35 AM Pager: [phone]  GI Attending Note   Chart was reviewed and patient was examined. X-rays and lab were reviewed.    I agree with management and plans.  Microcytic anemia secondary to probable chronic GI blood loss with superimposed GI bleed.  Suspect NSAID-induced ulcer.  Lower GI bleeding source is less likely.  We'll tentatively schedule EGD with Dr. Collene Mares in a.m. provided that the  INR has normalized.  Sandy Salaam. Deatra Ina, M.D., [facility] Cell [phone]

## 2014-08-02 NOTE — ED Provider Notes (Signed)
Medical screening examination/treatment/procedure(s) were conducted as a shared visit with non-physician practitioner(s) or resident and myself. I personally evaluated the patient during the encounter and agree with the findings.  I have personally reviewed any xrays and/ or EKG's with the provider and I agree with interpretation.  Patient with COPD, reflux, vascular disease, atrial flutter, aortic stenosis on Xarelto presents with worsening melena and frequent falls for lightheadedness the past 3 days. Pa andtient denies complete syncope or head injury. Patient recalls falls. Patient does feel very weak she stands up. Patient denies GI bleeding history. On exam patient pale, general weakness, abdomen nontender no guarding, dry mucous membranes, pale conjunctiva, Hemoccult-positive from below. EKG shows significant ST depression. Concern for demand ischemia from anemia. Patient denies chest pain at this time. Blood ordered stat, discussed with GI on call who will evaluate the patient, paged triad physician for stepped-down admission.  CRITICAL CARE Performed by: Mariea Clonts  Total critical care time: 35 min  Critical care time was exclusive of separately billable procedures and treating other patients.  Critical care was necessary to treat or prevent imminent or life-threatening deterioration.  Critical care was time spent personally by me on the following activities: development of treatment plan with patient and/or surrogate as well as nursing, discussions with consultants, evaluation of patient's response to treatment, examination of patient, obtaining history from patient or surrogate, ordering and performing treatments and interventions, ordering and review of laboratory studies, ordering and review of radiographic studies, pulse oximetry and re-evaluation of patient's condition.  Acute GI bleeding, symptomatic anemia   Mariea Clonts, MD 08/02/14 [phone]

## 2014-08-02 NOTE — H&P (Addendum)
[facility] History and Physical  JAYLE SOLARZ DJM:[id number] DOB: February 13, 1941 DOA: 08/01/2014  Referring physician: ER physician. PCP: Nyoka Cowden, MD   Chief Complaint: Frequent falls.  HPI: [name] is a 73 y.o. female with history of CAD status post CABG, atrial fibrillation, COPD, hypertension, diabetes mellitus, peripheral vascular disease has been experiencing frequent falls over the last 3-4 days. Patient denies having loss consciousness. Patient also has been experiencing dark tarry stools and last night has had multiple episodes of loose stools. Denies any recent use of antibiotics or any nausea vomiting or abdominal pain. In the ER patient was found to have a hemoglobin around 5 with stool for occult blood positive. Patient initially was mildly hypotensive. On-call gastroenterologist was consulted by the ED physician. Patient has been around blood transfusion. Patient used to take xarelto for atrial fibrillation and has had recent embolic infarct in May of this year. Patient's troponin has been positive with EKG showing inferolateral ST depression. Patient has been admitted for further management and PRBC transfusion has been started. Patient's medication list shows Mobic.  Review of Systems: As presented in the history of presenting illness, rest negative.  Past Medical History  Diagnosis Date  . ANEMIA 08/28/2010  . CAD (coronary artery disease)     a. s/p CABGx4 in 2001.  Marland Kitchen COLONIC POLYPS, HX OF 06/15/2007  . COPD (chronic obstructive pulmonary disease)   . Depressive disorder   . GERD 12/19/2009  . Hyperlipidemia   . Hypertension   . HYPOTENSION 08/11/2010  . Osteoarthritis   . PVD (peripheral vascular disease)     a. Duplex 03/2013: stable moderate carotid dz - 22-29% RICA, 79-89% LICA, L subclavian artery to CCA stent widely patent.  b. Prior R fempop bypass graft, R CIA stent.  . Lumbar stenosis   . Lung abscess 2011  . CHF (congestive heart  failure)   . Hx of colonoscopy   . Paroxysmal atrial flutter     a. Dx 01/2014, placed on Xarelto.  . Aortic stenosis     a. Mild by echo 01/2013.  . Diabetes mellitus without complication    Past Surgical History  Procedure Laterality Date  . L subclavian bypass  07/2004  . Tubal ligation    . Coronary artery bypass graft  05/2000  . Femoral popliteal bypass-r  04/1999  . Right common iliac pta with stent placement  07/2000  . Right leg blockage  2003  . Aorta bifemoral bypass graft  2004  . Left cea  2005  . Lung cyst biopsy  2011   Social History:  reports that she quit smoking about 14 years ago. Her smoking use included Cigarettes. She has a 60 pack-year smoking history. She has never used smokeless tobacco. She reports that she does not drink alcohol or use illicit drugs. Where does patient live home. Can patient participate in ADLs? Yes.  Allergies  Allergen Reactions  . Codeine Phosphate     REACTION: unspecified    Family History:  Family History  Problem Relation Age of Onset  . Diabetes Father   . Heart attack Mother   . Diabetes    . Coronary artery disease    . Colon cancer Neg Hx       Prior to Admission medications   Medication Sig Start Date End Date Taking? Authorizing Provider  amiodarone (PACERONE) 200 MG tablet Take 1 tablet (200 mg total) by mouth daily. 07/03/14  Yes Dorothy Spark, MD  atorvastatin (LIPITOR)  40 MG tablet Take 1 tablet (40 mg total) by mouth every evening. 05/23/14  Yes Dorothy Spark, MD  cilostazol (PLETAL) 100 MG tablet Take 100 mg by mouth 2 (two) times daily.   Yes Historical Provider, MD  diltiazem (CARDIZEM CD) 120 MG 24 hr capsule Take 1 capsule by mouth daily. 03/18/14  Yes Historical Provider, MD  furosemide (LASIX) 40 MG tablet Take 0.5 tablets (20 mg total) by mouth daily. 04/13/14  Yes Adeline Saralyn Pilar, MD  gabapentin (NEURONTIN) 600 MG tablet Take 1 tablet (600 mg total) by mouth 3 (three) times daily. 07/23/13  Yes Marletta Lor, MD  glimepiride (AMARYL) 1 MG tablet Take 1 mg by mouth daily with breakfast.   Yes Historical Provider, MD  HYDROcodone-acetaminophen (NORCO/VICODIN) 5-325 MG per tablet Take 0.5 tablets by mouth every 6 (six) hours as needed for moderate pain. 03/25/14  Yes Marletta Lor, MD  LORazepam (ATIVAN) 0.5 MG tablet Take 0.5 mg by mouth 2 (two) times daily.   Yes Historical Provider, MD  losartan (COZAAR) 50 MG tablet Take 1 tablet (50 mg total) by mouth daily. 07/03/14  Yes Dorothy Spark, MD  meloxicam (MOBIC) 7.5 MG tablet Take 7.5 mg by mouth daily.  05/22/14  Yes Historical Provider, MD  metFORMIN (GLUCOPHAGE) 500 MG tablet Take 1 tablet (500 mg total) by mouth 2 (two) times daily with a meal. 04/26/14  Yes Marletta Lor, MD  metoprolol (LOPRESSOR) 50 MG tablet Take 25 mg by mouth 2 (two) times daily.  04/13/14  Yes Adeline Saralyn Pilar, MD  Multiple Vitamins-Minerals (CENTRUM SILVER ULTRA WOMENS PO) Take 1 capsule by mouth daily.    Yes Historical Provider, MD  nitroGLYCERIN (NITROSTAT) 0.4 MG SL tablet Place 0.4 mg under the tongue every 5 (five) minutes as needed for chest pain.   Yes Historical Provider, MD  potassium chloride (K-DUR) 10 MEQ tablet Take 1 tablet (10 mEq total) by mouth daily. 03/12/14  Yes Peter M Martinique, MD  promethazine (PHENERGAN) 25 MG tablet Take 1 tablet (25 mg total) by mouth every 6 (six) hours as needed for nausea or vomiting. 03/25/14  Yes Marletta Lor, MD  rivaroxaban (XARELTO) 20 MG TABS tablet Take 1 tablet (20 mg total) by mouth daily with supper. 03/21/14  Yes Dorothy Spark, MD  sertraline (ZOLOFT) 50 MG tablet Take 25 mg by mouth daily.    Yes Historical Provider, MD  tiotropium (SPIRIVA) 18 MCG inhalation capsule Place 1 capsule (18 mcg total) into inhaler and inhale daily. 04/13/14  Yes Sheila Oats, MD    Physical Exam: Filed Vitals:   08/02/14 0100 08/02/14 0115 08/02/14 0130 08/02/14 0145  BP: 106/40 108/42 112/43 116/42   Pulse: 83 86 85 84  Temp: 99.3 F (37.4 C) 99 F (37.2 C) 98.8 F (37.1 C)   TempSrc:      Resp: 18 15 15 16   Height:      Weight:      SpO2: 92% 98% 89%      General:  Well-developed well-nourished.  Eyes: Anicteric no pallor.  ENT: No discharge from the ears eyes nose mouth.  Neck: No mass felt.  Cardiovascular: S1-S2 heard.  Respiratory: No rhonchi or crepitation.  Abdomen: Soft nontender bowel sounds present.  Skin: Chronic skin changes.  Musculoskeletal: No edema.  Psychiatric: Appears normal.  Neurologic: Alert awake oriented to time place and person. Moves all extremities.  Labs on Admission:  Basic Metabolic Panel:  Recent Labs  Lab 08/01/14 2257 08/01/14 2305  NA 137 135*  K 3.2* 3.0*  CL 97 98  CO2 22  --   GLUCOSE 79 81  BUN 34* 33*  CREATININE 1.55* 1.60*  CALCIUM 8.4  --   MG 1.7  --    Liver Function Tests:  Recent Labs Lab 08/01/14 2257  AST 26  ALT 19  ALKPHOS 72  BILITOT 0.3  PROT 6.3  ALBUMIN 3.1*   No results found for this basename: LIPASE, AMYLASE,  in the last 168 hours No results found for this basename: AMMONIA,  in the last 168 hours CBC:  Recent Labs Lab 08/01/14 2257 08/01/14 2305  WBC 9.6  --   NEUTROABS 7.7  --   HGB 5.8* 5.4*  HCT 19.3* 16.0*  MCV 80.1  --   PLT 242  --    Cardiac Enzymes:  Recent Labs Lab 08/01/14 2231  TROPONINI 2.27*    BNP (last 3 results)  Recent Labs  04/09/14 2101  PROBNP 678.0*   CBG: No results found for this basename: GLUCAP,  in the last 168 hours  Radiological Exams on Admission: Dg Chest Portable 1 View  08/02/2014   CLINICAL DATA:  MELENA FALL  EXAM: PORTABLE CHEST - 1 VIEW  COMPARISON:  04/09/2014  FINDINGS: Previous CABG. Heart size upper limits normal. Patchy interstitial opacities in both lung bases slightly more conspicuous than on prior study. No focal airspace disease. No overt edema. No effusion. Atheromatous aorta.  IMPRESSION: 1. Probable mild  bibasilar atelectasis. Otherwise stable appearance since previous exam.   Electronically Signed   By: Arne Cleveland M.D.   On: 08/02/2014 00:55    EKG: Independently reviewed. Normal sinus rhythm with ST depression in the inferolateral leads.  Assessment/Plan Active Problems:   ANEMIA   CAD, s/p CABG 2001   COPD Gold B   Acute GI bleeding   Demand ischemia   1. Acute GI bleed in a patient on xarelto - patient has been kept n.p.o. and Protonix infusion started. Possible source could be upper GI bleed. 2 units of packed red blood cell transfusion has been ordered. Closely follow CBC. GI has been consulted by the ED physician. Patient's xarelto is on hold. 2. Demand ischemia with history of CAD status post CABG - patient is receiving PRBC transfusion. Closely follow CBC and troponins. 3. Coagulopathy secondary to xarelto - xarelto is on hold. May try Kcentra to reverse xarelto if there is evidence of ongoing bleed. 4. Acute blood loss anemia - follow CBC after transfusion. 5. Acute renal failure - probably secondary to hypotension and GI bleed. Follow intake output and metabolic panel. 6. COPD - presently not wheezing. 7. Atrial fibrillation - presently in sinus rhythm. Xarelto on hold. 8. Hyperlipidemia. 9. Diabetes mellitus - patient's medications are on hold due to patient being n.p.o. Closely follow CBGs.  Addendum - I have discussed with on-call gastroenterologist Dr. Leroy Kennedy and also pulmonary critical care.  Code Status: Full code.  Family Communication: Patient's daughter at the bedside.  Disposition Plan: Admit to inpatient to step down.    KAKRAKANDY,ARSHAD N. [facility] Pager [phone].  If 7PM-7AM, please contact night-coverage [email or web address] Password [email or web address] 08/02/2014, 1:59 AM

## 2014-08-02 NOTE — Progress Notes (Signed)
Pt would like to speak to GI physician with her daughter before she signs consent for EGD in AM.

## 2014-08-02 NOTE — Progress Notes (Signed)
Pt admitted into room 3S12 with belongings. T 98 BP 106/40 HR 83 SpO2 100 on 2L RR 17. Pt has exp wheezes throughout after activity. Pt is currently receiving 1/2 unit of pRBCs. Daughter is at bedside. Will continue to monitor.

## 2014-08-02 NOTE — Progress Notes (Signed)
Utilization review completed.  

## 2014-08-02 NOTE — ED Notes (Signed)
Blood ready for PT

## 2014-08-03 ENCOUNTER — Encounter (HOSPITAL_COMMUNITY)
Admission: EM | Disposition: A | Discharge status: Home / Self Care | Payer: Self-pay | Source: Home / Self Care | Attending: Family Medicine

## 2014-08-03 ENCOUNTER — Encounter (HOSPITAL_COMMUNITY): Payer: Self-pay | Admitting: *Deleted

## 2014-08-03 HISTORY — PX: ESOPHAGOGASTRODUODENOSCOPY: SHX5428

## 2014-08-03 LAB — PROTIME-INR
INR: 1.39 (ref 0.00–1.49)
Prothrombin Time: 17.1 seconds — ABNORMAL HIGH (ref 11.6–15.2)

## 2014-08-03 LAB — CBC
HEMATOCRIT: 25.7 % — AB (ref 36.0–46.0)
Hemoglobin: 8.2 g/dL — ABNORMAL LOW (ref 12.0–15.0)
MCH: 26.5 pg (ref 26.0–34.0)
MCHC: 31.9 g/dL (ref 30.0–36.0)
MCV: 83.2 fL (ref 78.0–100.0)
Platelets: 186 10*3/uL (ref 150–400)
RBC: 3.09 MIL/uL — ABNORMAL LOW (ref 3.87–5.11)
RDW: 18.4 % — AB (ref 11.5–15.5)
WBC: 11 10*3/uL — ABNORMAL HIGH (ref 4.0–10.5)

## 2014-08-03 LAB — GLUCOSE, CAPILLARY
GLUCOSE-CAPILLARY: 118 mg/dL — AB (ref 70–99)
GLUCOSE-CAPILLARY: 122 mg/dL — AB (ref 70–99)
Glucose-Capillary: 127 mg/dL — ABNORMAL HIGH (ref 70–99)
Glucose-Capillary: 122 mg/dL — ABNORMAL HIGH (ref 70–99)
Glucose-Capillary: 131 mg/dL — ABNORMAL HIGH (ref 70–99)

## 2014-08-03 SURGERY — EGD (ESOPHAGOGASTRODUODENOSCOPY)
Anesthesia: Moderate Sedation

## 2014-08-03 MED ORDER — MIDAZOLAM HCL 5 MG/ML IJ SOLN
INTRAMUSCULAR | Status: AC
  Filled 2014-08-03: qty 2

## 2014-08-03 MED ORDER — LIDOCAINE VISCOUS 2 % MT SOLN
OROMUCOSAL | Status: AC
  Filled 2014-08-03: qty 15

## 2014-08-03 MED ORDER — FENTANYL CITRATE 0.05 MG/ML IJ SOLN
INTRAMUSCULAR | Status: DC | PRN
  Administered 2014-08-03: 25 ug via INTRAVENOUS
  Administered 2014-08-03: 12.5 ug via INTRAVENOUS

## 2014-08-03 MED ORDER — METOPROLOL TARTRATE 25 MG PO TABS
25.0000 mg | ORAL_TABLET | Freq: Two times a day (BID) | ORAL | Status: DC
  Administered 2014-08-03 – 2014-08-07 (×6): 25 mg via ORAL
  Filled 2014-08-03 (×10): qty 1

## 2014-08-03 MED ORDER — LIDOCAINE VISCOUS 2 % MT SOLN
OROMUCOSAL | Status: DC | PRN
  Administered 2014-08-03: 10 mL via OROMUCOSAL

## 2014-08-03 MED ORDER — FENTANYL CITRATE 0.05 MG/ML IJ SOLN
INTRAMUSCULAR | Status: AC
  Filled 2014-08-03: qty 2

## 2014-08-03 MED ORDER — MIDAZOLAM HCL 10 MG/2ML IJ SOLN
INTRAMUSCULAR | Status: DC | PRN
  Administered 2014-08-03: 1 mg via INTRAVENOUS
  Administered 2014-08-03: 2 mg via INTRAVENOUS

## 2014-08-03 NOTE — Op Note (Signed)
[facility] [place], [address]   OPERATIVE PROCEDURE REPORT  PATIENT :[name], [name]  MR#: [id number] BIRTHDATE :03-08-1941 GENDER: Female ENDOSCOPIST: Dr.  Edmonia James, MD ASSISTANT:   Clemmie Krill, RN, BSN Cristopher Estimable, technician PROCEDURE DATE: Aug 17, 2014 PRE-PROCEDURE PREPERATION: Patient fasted for 4 hours prior to procedure. PRE-PROCEDURE PHYSICAL: Patient has stable vital signs.  Neck is supple.  There is no JVD, thyromegaly or LAD.  Chest auscultation reveals decreased breath sounds at the bases.  S1 and S2 regular. Abdomen soft, non-distended, non-tender with NABS. PROCEDURE:     EGD, diagnostic ASA CLASS:     Class IV INDICATIONS:     1) Melena 2) Acute post hemorrhagic anemia. MEDICATIONS:     Fentanyl 37.5 mcg and Versed 3 mg IV TOPICAL ANESTHETIC:   Viscous xylocaine-10 cc PO.  DESCRIPTION OF PROCEDURE:   After the risks benefits and alternatives of the procedure were thoroughly explained, informed consent was obtained.  The Pentax Gastroscope [id number]  was introduced through the mouth and advanced to the second portion of the duodenum , without limitations. The instrument was slowly withdrawn as the mucosa was fully examined.   The esophagus and the GEJ were widely patent with whitish plaques in the mid-esophagus that were brushed for cytology. The  stomach and the proximal small bowel appeared normal. There were no ulcers, erosions, masses or polyps noted. Retroflexed views revealed a small hiatal hernia. The scope was then withdrawn from the patient and the procedure terminated. The patient tolerated the procedure without immediate complications.  IMPRESSION: Whitish plaques in the mid-esophagus-consistent with esophageal candiasis-brushings done; a small hiatal hernia noted on retroflexion; otherwise, normal esophagogastroduodenoscopy.  RECOMMENDATIONS:     1.  Anti-reflux regimen to be followed 2.  Avoid  NSAIDS for now. 3.  Continue current medications.  REPEAT EXAM:  for colonoscopy on Monday . DISCHARGE INSTRUCTIONS: Standard discharge instructions given. _______________________________ eSigned:  Dr. Edmonia James, MD 08/17/14 10:40 AM   CPT CODES:     [phone], EGD  DIAGNOSIS CODES:     578.1 Melena 280.9 Iron Deficiency Anemia   CC: THP  PATIENT NAME:  [name], [name] MR#: [id number]

## 2014-08-03 NOTE — Progress Notes (Signed)
Called into room by Pt, she has spoke on the phone with her daughter and would like to sign informed consent for upper endoscopy in AM.

## 2014-08-03 NOTE — Progress Notes (Signed)
[facility] PROGRESS NOTE  MERLIN EGE KVQ:[id number] DOB: 01-Apr-1941 DOA: 08/01/2014 PCP: Nyoka Cowden, MD  Assessment/Plan: Active Problems:   Acute GI bleeding/ ANEMIA - GI on board and further evaluating patient. Plan is for colonoscopy on Monday 08/05/14 - Xarelto on hold    CAD, s/p CABG 2001 - stable, no chest discomfort reported.  Atrial fibrillation - since blood pressure improved will add metoprolol for rate controlled - Xarelto on hold 2ary to acute GI bleed    COPD Gold B - stable, continue current regimen.    Code Status: full Family Communication: d/c with daughter and patient  Disposition Plan: Pending further evaluation and recommendations.   Consultants:  GI  Procedures:  endoscopy  Antibiotics:  None  HPI/Subjective: No acute issues reported overnight.  Objective: Filed Vitals:   08/03/14 1206  BP:   Pulse:   Temp: 98.5 F (36.9 C)  Resp:     Intake/Output Summary (Last 24 hours) at 08/03/14 1525 Last data filed at 08/03/14 1100  Gross per 24 hour  Intake    650 ml  Output   3700 ml  Net  -3050 ml   Filed Weights   08/01/14 2225 08/02/14 0236  Weight: 69.854 kg (154 lb) 71.9 kg (158 lb 8.2 oz)    Exam:   General:  Pt in nad, alert and awake  Cardiovascular: irregularly irregular, no murmurs  Respiratory: cta bl, no wheezes  Abdomen: soft, ND, NT  Musculoskeletal: no cyanosis or clubbing   Data Reviewed: Basic Metabolic Panel:  Recent Labs Lab 08/01/14 2257 08/01/14 2305 08/02/14 0920  NA 137 135* 139  K 3.2* 3.0* 3.6*  CL 97 98 103  CO2 22  --  21  GLUCOSE 79 81 73  BUN 34* 33* 27*  CREATININE 1.55* 1.60* 1.32*  CALCIUM 8.4  --  8.2*  MG 1.7  --   --    Liver Function Tests:  Recent Labs Lab 08/01/14 2257 08/02/14 0920  AST 26 32  ALT 19 19  ALKPHOS 72 72  BILITOT 0.3 1.4*  PROT 6.3 6.0  ALBUMIN 3.1* 3.0*   No results found for this basename: LIPASE, AMYLASE,  in the last  168 hours No results found for this basename: AMMONIA,  in the last 168 hours CBC:  Recent Labs Lab 08/01/14 2257 08/01/14 2305 08/02/14 0920 08/02/14 1615 08/03/14 0420  WBC 9.6  --  11.1* 12.0* 11.0*  NEUTROABS 7.7  --  9.3*  --   --   HGB 5.8* 5.4* 8.2* 8.6* 8.2*  HCT 19.3* 16.0* 26.4* 26.4* 25.7*  MCV 80.1  --  83.3 81.0 83.2  PLT 242  --  214 210 186   Cardiac Enzymes:  Recent Labs Lab 08/01/14 2231 08/02/14 0926  TROPONINI 2.27* 4.13*   BNP (last 3 results)  Recent Labs  04/09/14 2101  PROBNP 678.0*   CBG:  Recent Labs Lab 08/02/14 2005 08/02/14 2353 08/03/14 0347 08/03/14 0735 08/03/14 1152  GLUCAP 134* 124* 127* 118* 122*    Recent Results (from the past 240 hour(s))  MRSA PCR SCREENING     Status: None   Collection Time    08/02/14  5:59 AM      Result Value Ref Range Status   MRSA by PCR NEGATIVE  NEGATIVE Final   Comment:            The GeneXpert MRSA Assay (FDA     approved for NASAL specimens     only), is  one component of a     comprehensive MRSA colonization     surveillance program. It is not     intended to diagnose MRSA     infection nor to guide or     monitor treatment for     MRSA infections.     Studies: Dg Chest Port 1 View  08/02/2014   CLINICAL DATA:  Shortness of Breath  EXAM: PORTABLE CHEST - 1 VIEW  COMPARISON:  08/01/2014  FINDINGS: Prior CABG. Heart is borderline in size. Diffuse interstitial prominence throughout the lungs, increased since prior study, likely interstitial edema. No confluent opacities or effusions. No acute bony abnormality.  IMPRESSION: Increasing interstitial prominence, likely interstitial edema.   Electronically Signed   By: Rolm Baptise M.D.   On: 08/02/2014 07:11   Dg Chest Portable 1 View  08/02/2014   CLINICAL DATA:  MELENA FALL  EXAM: PORTABLE CHEST - 1 VIEW  COMPARISON:  04/09/2014  FINDINGS: Previous CABG. Heart size upper limits normal. Patchy interstitial opacities in both lung bases  slightly more conspicuous than on prior study. No focal airspace disease. No overt edema. No effusion. Atheromatous aorta.  IMPRESSION: 1. Probable mild bibasilar atelectasis. Otherwise stable appearance since previous exam.   Electronically Signed   By: Arne Cleveland M.D.   On: 08/02/2014 00:55    Scheduled Meds: . insulin aspart  0-9 Units Subcutaneous TID WC  . [START ON 08/05/2014] pantoprazole (PROTONIX) IV  40 mg Intravenous Q12H  . phytonadione  5 mg Oral Daily  . tiotropium  18 mcg Inhalation Daily   Continuous Infusions: . sodium chloride    . pantoprozole (PROTONIX) infusion 8 mg/hr (08/03/14 0800)    Time spent: > 35 minutes    Velvet Bathe  [facility] Pager [phone]. If 7PM-7AM, please contact night-coverage at [email or web address], password [facility] 08/03/2014, 3:25 PM  LOS: 2 days

## 2014-08-04 LAB — CBC
HEMATOCRIT: 27.6 % — AB (ref 36.0–46.0)
HEMOGLOBIN: 8.5 g/dL — AB (ref 12.0–15.0)
MCH: 25.8 pg — AB (ref 26.0–34.0)
MCHC: 30.8 g/dL (ref 30.0–36.0)
MCV: 83.6 fL (ref 78.0–100.0)
Platelets: 200 10*3/uL (ref 150–400)
RBC: 3.3 MIL/uL — AB (ref 3.87–5.11)
RDW: 18.5 % — ABNORMAL HIGH (ref 11.5–15.5)
WBC: 9.2 10*3/uL (ref 4.0–10.5)

## 2014-08-04 LAB — GLUCOSE, CAPILLARY
GLUCOSE-CAPILLARY: 98 mg/dL (ref 70–99)
GLUCOSE-CAPILLARY: 107 mg/dL — AB (ref 70–99)
Glucose-Capillary: 129 mg/dL — ABNORMAL HIGH (ref 70–99)
Glucose-Capillary: 107 mg/dL — ABNORMAL HIGH (ref 70–99)
Glucose-Capillary: 104 mg/dL — ABNORMAL HIGH (ref 70–99)
Glucose-Capillary: 76 mg/dL (ref 70–99)

## 2014-08-04 MED ORDER — SODIUM CHLORIDE 0.9 % IV SOLN: INTRAVENOUS | Status: DC

## 2014-08-04 MED ORDER — PEG 3350-KCL-NA BICARB-NACL 420 G PO SOLR
4000.0000 mL | Freq: Once | ORAL | Status: AC
  Administered 2014-08-04: 4000 mL via ORAL
  Filled 2014-08-04: qty 4000

## 2014-08-04 NOTE — Progress Notes (Signed)
[facility] PROGRESS NOTE  [name] VOZ:[id number] DOB: 1941-02-12 DOA: 08/01/2014 PCP: Nyoka Cowden, MD  Assessment/Plan: Active Problems:   Acute GI bleeding/ ANEMIA - GI on board and further evaluating patient. Plan is for colonoscopy on Monday 08/05/14 - Xarelto on hold - reassess cbc    CAD, s/p CABG 2001 - stable, no chest discomfort reported.  Atrial fibrillation - On metoprolol for rate controlled - Xarelto on hold 2ary to acute GI bleed    COPD Gold B - stable, continue current regimen.   Code Status: full Family Communication: d/c with daughter and patient  Disposition Plan: Pending further evaluation and recommendations.   Consultants:  GI  Procedures:  endoscopy  Antibiotics:  None  HPI/Subjective: No acute issues reported overnight.  Objective: Filed Vitals:   08/04/14 0827  BP:   Pulse:   Temp: 98.2 F (36.8 C)  Resp:     Intake/Output Summary (Last 24 hours) at 08/04/14 1414 Last data filed at 08/04/14 1300  Gross per 24 hour  Intake    645 ml  Output   1300 ml  Net   -655 ml   Filed Weights   08/01/14 2225 08/02/14 0236  Weight: 69.854 kg (154 lb) 71.9 kg (158 lb 8.2 oz)    Exam:   General:  Pt in nad, alert and awake  Cardiovascular: irregularly irregular, no murmurs  Respiratory: cta bl, no wheezes  Abdomen: soft, ND, NT  Musculoskeletal: no cyanosis or clubbing   Data Reviewed: Basic Metabolic Panel:  Recent Labs Lab 08/01/14 2257 08/01/14 2305 08/02/14 0920  NA 137 135* 139  K 3.2* 3.0* 3.6*  CL 97 98 103  CO2 22  --  21  GLUCOSE 79 81 73  BUN 34* 33* 27*  CREATININE 1.55* 1.60* 1.32*  CALCIUM 8.4  --  8.2*  MG 1.7  --   --    Liver Function Tests:  Recent Labs Lab 08/01/14 2257 08/02/14 0920  AST 26 32  ALT 19 19  ALKPHOS 72 72  BILITOT 0.3 1.4*  PROT 6.3 6.0  ALBUMIN 3.1* 3.0*   No results found for this basename: LIPASE, AMYLASE,  in the last 168 hours No results  found for this basename: AMMONIA,  in the last 168 hours CBC:  Recent Labs Lab 08/01/14 2257 08/01/14 2305 08/02/14 0920 08/02/14 1615 08/03/14 0420  WBC 9.6  --  11.1* 12.0* 11.0*  NEUTROABS 7.7  --  9.3*  --   --   HGB 5.8* 5.4* 8.2* 8.6* 8.2*  HCT 19.3* 16.0* 26.4* 26.4* 25.7*  MCV 80.1  --  83.3 81.0 83.2  PLT 242  --  214 210 186   Cardiac Enzymes:  Recent Labs Lab 08/01/14 2231 08/02/14 0926  TROPONINI 2.27* 4.13*   BNP (last 3 results)  Recent Labs  04/09/14 2101  PROBNP 678.0*   CBG:  Recent Labs Lab 08/03/14 2024 08/03/14 2322 08/04/14 0436 08/04/14 0811 08/04/14 1234  GLUCAP 122* 129* 107* 104* 76    Recent Results (from the past 240 hour(s))  MRSA PCR SCREENING     Status: None   Collection Time    08/02/14  5:59 AM      Result Value Ref Range Status   MRSA by PCR NEGATIVE  NEGATIVE Final   Comment:            The GeneXpert MRSA Assay (FDA     approved for NASAL specimens     only), is one component  of a     comprehensive MRSA colonization     surveillance program. It is not     intended to diagnose MRSA     infection nor to guide or     monitor treatment for     MRSA infections.     Studies: No results found.  Scheduled Meds: . insulin aspart  0-9 Units Subcutaneous TID WC  . metoprolol tartrate  25 mg Oral BID  . [START ON 08/05/2014] pantoprazole (PROTONIX) IV  40 mg Intravenous Q12H  . phytonadione  5 mg Oral Daily  . tiotropium  18 mcg Inhalation Daily   Continuous Infusions: . sodium chloride    . pantoprozole (PROTONIX) infusion 8 mg/hr (08/04/14 0856)    Time spent: > 35 minutes    Velvet Bathe  [facility] Pager [phone]. If 7PM-7AM, please contact night-coverage at [email or web address], password [facility] 08/04/2014, 2:14 PM  LOS: 3 days

## 2014-08-04 NOTE — Progress Notes (Signed)
I agree with the above plan 

## 2014-08-04 NOTE — Progress Notes (Signed)
Cross cover [facility] Subjective: Since I last evaluated the patient, she seems to be doing fairly well. No further evidence of GI blood loss. She has agreed to do a prep for a colonoscopy tomorrow. Denies having any nausea, vomiting, abdominal pain, diarrhea or constipation.  Objective: Vital signs in last 24 hours: Temp:  [98.2 F (36.8 C)-99.2 F (37.3 C)] 98.2 F (36.8 C) (09/06 0827) Pulse Rate:  [68-99] 72 (09/06 0800) Resp:  [17-25] 19 (09/06 0800) BP: (98-122)/(33-91) 104/91 mmHg (09/06 0800) SpO2:  [89 %-99 %] 90 % (09/06 0827) Last BM Date: 08/04/14  Intake/Output from previous day: 09/05 0701 - 09/06 0700 In: 790 [P.O.:240; I.V.:550] Out: 1551 [Urine:1551] Intake/Output this shift: Total I/O In: 150 [I.V.:150] Out: -   General appearance: alert, cooperative, appears stated age and mild distress due to shortness of breath  Resp: clear to auscultation bilaterally with decreased breath sounds at the bases. Cardio: irregular rate and rhythm, S1, S2 normal, no murmur, click, rub or gallop GI: soft, non-tender; bowel sounds normal; no masses,  no organomegaly Extremities: extremities normal, atraumatic, no cyanosis or edema  Lab Results:  Recent Labs  08/02/14 0920 08/02/14 1615 08/03/14 0420  WBC 11.1* 12.0* 11.0*  HGB 8.2* 8.6* 8.2*  HCT 26.4* 26.4* 25.7*  PLT 214 210 186   BMET  Recent Labs  08/01/14 2257 08/01/14 2305 08/02/14 0920  NA 137 135* 139  K 3.2* 3.0* 3.6*  CL 97 98 103  CO2 22  --  21  GLUCOSE 79 81 73  BUN 34* 33* 27*  CREATININE 1.55* 1.60* 1.32*  CALCIUM 8.4  --  8.2*   LFT  Recent Labs  08/02/14 0920  PROT 6.0  ALBUMIN 3.0*  AST 32  ALT 19  ALKPHOS 72  BILITOT 1.4*   PT/INR  Recent Labs  08/01/14 2257 08/03/14 0420  LABPROT 39.6* 17.1*  INR 4.08* 1.39   Medications: I have reviewed the patient's current medications.  Assessment/Plan: Melena with anemia; EGD was unrevealing; will prep for a colonoscopy tomorrow.  She has been off the Xarelto since admission.   LOS: 3 days   Raidyn Breiner 08/04/2014, 2:17 PM

## 2014-08-05 ENCOUNTER — Encounter (HOSPITAL_COMMUNITY)
Admission: EM | Disposition: A | Discharge status: Home / Self Care | Payer: Self-pay | Source: Home / Self Care | Attending: Family Medicine

## 2014-08-05 ENCOUNTER — Encounter (HOSPITAL_COMMUNITY): Payer: Self-pay | Admitting: Gastroenterology

## 2014-08-05 HISTORY — PX: COLONOSCOPY: SHX5424

## 2014-08-05 LAB — TYPE AND SCREEN
ABO/RH(D): O POS
Antibody Screen: NEGATIVE
UNIT DIVISION: 0
Unit division: 0
Unit division: 0

## 2014-08-05 LAB — GLUCOSE, CAPILLARY
GLUCOSE-CAPILLARY: 101 mg/dL — AB (ref 70–99)
GLUCOSE-CAPILLARY: 89 mg/dL (ref 70–99)
GLUCOSE-CAPILLARY: 102 mg/dL — AB (ref 70–99)
Glucose-Capillary: 113 mg/dL — ABNORMAL HIGH (ref 70–99)
Glucose-Capillary: 96 mg/dL (ref 70–99)
Glucose-Capillary: 154 mg/dL — ABNORMAL HIGH (ref 70–99)

## 2014-08-05 SURGERY — COLONOSCOPY
Anesthesia: Moderate Sedation | Laterality: Left

## 2014-08-05 MED ORDER — FENTANYL CITRATE 0.05 MG/ML IJ SOLN
INTRAMUSCULAR | Status: AC
  Filled 2014-08-05: qty 2

## 2014-08-05 MED ORDER — MIDAZOLAM HCL 5 MG/5ML IJ SOLN
INTRAMUSCULAR | Status: DC | PRN
  Administered 2014-08-05 (×2): 1 mg via INTRAVENOUS

## 2014-08-05 MED ORDER — MIDAZOLAM HCL 5 MG/ML IJ SOLN
INTRAMUSCULAR | Status: AC
  Filled 2014-08-05: qty 2

## 2014-08-05 MED ORDER — FENTANYL CITRATE 0.05 MG/ML IJ SOLN
INTRAMUSCULAR | Status: DC | PRN
Start: 2014-08-05 — End: 2014-08-05
  Administered 2014-08-05: 25 ug via INTRAVENOUS

## 2014-08-05 NOTE — Care Management Note (Signed)
    Page 1 of 1   08/07/2014     3:06:52 PM CARE MANAGEMENT NOTE 08/07/2014  Patient:  TONI, DEMO   Account Number:  [id number]  Date Initiated:  08/05/2014  Documentation initiated by:  Ricki Miller  Subjective/Objective Assessment:   Pt admitted for weakness and fall. Acute GI bleeding/ ANEMIA     Action/Plan:   CM will monitor for disposition needs.   Anticipated DC Date:  08/07/2014   Anticipated DC Plan:  [place]  CM consult      [facility] Choice  HOME HEALTH   Choice offered to / List presented to:  C-1 Patient        [place] arranged  [place] [facility]      [place].   Status of service:  Completed, signed off Medicare Important Message given?  YES (If response is "NO", the following Medicare IM given date fields will be blank) Date Medicare IM given:  08/05/2014 Medicare IM given by:  Ricki Miller Date Additional Medicare IM given:   Additional Medicare IM given by:    Discharge Disposition:  [place]  Per UR Regulation:  Reviewed for med. necessity/level of care/duration of stay  If discussed at [place] of Stay Meetings, dates discussed:    Comments:  08/07/14 [place], BSN [phone] patient for dc today, she chose [facility] for  HHPt, referral made to [facility] [place] notified.  Soc will begin 24-48 hrs post dc.

## 2014-08-05 NOTE — Progress Notes (Signed)
UR Completed Isobella Ascher Graves-Bigelow, RN,BSN [phone]  

## 2014-08-05 NOTE — Clinical Documentation Improvement (Addendum)
"  CHF" documented in current record.  Most recent Echo in [facility] dated 04/11/14 - ef 98-33%, Grade 2 Diastolic Dysfunction, mild mitral regurgitation.  Lasix 40 mg IV x 1 given 08/02/14 following complaints of shortness of breath and wheezing per nursing notes not improved following breathing treatment.  NS infusion also discontinued when IV Lasix was given.  I&O 08/02/14 to 08/03/14 - 900 ml IN, 5250 ml OUT per [facility].  Please document the ACUITY and TYPE of CHF monitored and treated this admission:  ACUITY:  - Acute   - Chronic  - Acute on Chronic  TYPE:  - Systolic  - Diastolic  - Combined   Thank You, Erling Conte ,RN Clinical Documentation Specialist:  [phone] [place] Information Management

## 2014-08-05 NOTE — Clinical Documentation Improvement (Signed)
  K+ 3.0 on 08/01/14 at 11:05 pm.  10 mEq KCL in 100 ml of IV fluid x 2 in ED 08/01/14.  Repeat K+ 3.6 on 08/02/14.  Possible Clinical Conditions:   - hypokalemia   - other condition   - unable to clinically determine  Thank You, Erling Conte ,RN Clinical Documentation Specialist:  [phone] [place] Information Management

## 2014-08-05 NOTE — Op Note (Signed)
[facility] [place], [address]   OPERATIVE PROCEDURE REPORT  PATIENT: [name], [name]  MR#: [id number] BIRTHDATE: 03/27/1941 GENDER: Female ENDOSCOPIST: Edmonia James, MD ASSISTANT:   Mariana Single, RN Corliss Parish, technician PROCEDURE DATE: 08/05/2014 PRE-PROCEDURE PREPARATION: The patient was prepped with a gallon of Golytely the night prior to the procedure.  The patient was fasted for 8 hours prior to the procedure.  PRE-PROCEDURE PHYSICAL: Patient has stable vital signs.  Neck is supple.  There is no JVD, thyromegaly or LAD.  Chest clear to auscultation.  S1 and S2 regular.  Abdomen soft, non-distended, non-tender with NABS. PROCEDURE:     Colonoscopy with cold biopsies x 2. ASA CLASS:     Class III INDICATIONS:     1).  Melena 2) Iron deficiency anemia 3) Colorectal cancer screening. MEDICATIONS:     Fentanyl 25 mcg and Versed 2 mg IV  DESCRIPTION OF PROCEDURE: After the risks, benefits, and alternatives of the procedure were thoroughly explained [including a 10% missed rate of cancer and polyps], informed consent was obtained. Digital rectal exam was performed.  The [id number])  was introduced through the anus  and advanced to the cecum, which was identified by both the appendix and ileocecal valve , limited by No adverse events experienced. The quality of the prep was fair at best, multipe washes were done . Multiple washes were done. Small lesions could be missed. The instrument was then slowly withdrawn as the colon was fully examined.     COLON FINDINGS: Mild diverticulosis was noted in the sigmoid colon. A single dimunitive polyp was found in the rectosigmoid and this was removed by cold biopsies x 2. The entire colonic mucosa appeared healthy with a normal vascular pattern.  No masses or AVMs were noted. The appendiceal orifice and the ICV were identified and photographed. Retroflexed views revealed no  abnormalities. The there was a large amount of residual stool in the colon; multiple washes were done; small lesions could be missed. The patient tolerated the procedure without immediate complications. The scope was then withdrawn from the patient and the procedure terminated.  TIME TO CECUM:   9 minutes 00 seconds WITHDRAW TIME:  7 minutes 00 seconds  IMPRESSION:     1.  Mild diverticulosis was noted in the sigmoid colon 2.  Single dimunitive polyp was found in the rectosigmoid colon-removed by cold biopsies x 2. 3. There was a large amount of residual stool in the colon-small lesions could be missed.   RECOMMENDATIONS:     1.  Hold aspirin, aspirin products, and anti-inflammatory medication for 2 weeks. 2.  Await pathology results 3.  Capsule endoscopy in AM. 4.  High fiber diet with liberal fluid intake.   REPEAT EXAM:      No recall planned due to age.  If the patient has any abnormal GI symptoms in the interim, she have been advised to contact the office as soon as possible for further recommendations.    CPT CODES:     [id number], EGD with biopsies  DIAGNOSIS CODES:     578.1 Melena 280.9 Iron Deficiency Anemia V76.51 Colorectal cancer screening   REFERRED BY:  eSigned:  Dr. Edmonia James, MD 08/05/2014 11:46 AM   PATIENT NAME:  [name], [name] MR#: [id number]

## 2014-08-05 NOTE — Progress Notes (Addendum)
[facility] PROGRESS NOTE  KAYNA SUPPA ION:[id number] DOB: October 22, 1941 DOA: 08/01/2014 PCP: Nyoka Cowden, MD  Assessment/Plan: Active Problems:   Acute GI bleeding/ ANEMIA - GI on board and further evaluating patient. Plan is for colonoscopy on Monday 08/05/14 - Xarelto on hold  - reassess cbc - Report impression of colonoscopy is mild diverticulosis in the sigmoid colon, single diminutive polyp in the rectosigmoid colon which was removed by cold biopsy x2 Addendum: Plan is for capsule endoscopy next am.    CAD, s/p CABG 2001 - stable, no chest discomfort reported.  Atrial fibrillation - On metoprolol for rate controlled - Xarelto on hold 2ary to acute GI bleed    COPD Gold B - stable, continue current regimen.   Code Status: full Family Communication: d/c with daughter and patient  Disposition Plan: Pending further evaluation and recommendations.   Consultants:  GI: Dr. Collene Mares  Procedures:  Endoscopy  Colonoscopy  Antibiotics:  None  HPI/Subjective: No acute issues reported overnight.  Objective: Filed Vitals:   08/05/14 1247  BP:   Pulse:   Temp: 98 F (36.7 C)  Resp:     Intake/Output Summary (Last 24 hours) at 08/05/14 1454 Last data filed at 08/05/14 0600  Gross per 24 hour  Intake    880 ml  Output   1600 ml  Net   -720 ml   Filed Weights   08/01/14 2225 08/02/14 0236  Weight: 69.854 kg (154 lb) 71.9 kg (158 lb 8.2 oz)    Exam:   General:  Pt in nad, alert and awake  Cardiovascular: irregularly irregular, no murmurs  Respiratory: cta bl, no wheezes  Abdomen: soft, ND, NT  Musculoskeletal: no cyanosis or clubbing   Data Reviewed: Basic Metabolic Panel:  Recent Labs Lab 08/01/14 2257 08/01/14 2305 08/02/14 0920  NA 137 135* 139  K 3.2* 3.0* 3.6*  CL 97 98 103  CO2 22  --  21  GLUCOSE 79 81 73  BUN 34* 33* 27*  CREATININE 1.55* 1.60* 1.32*  CALCIUM 8.4  --  8.2*  MG 1.7  --   --    Liver Function  Tests:  Recent Labs Lab 08/01/14 2257 08/02/14 0920  AST 26 32  ALT 19 19  ALKPHOS 72 72  BILITOT 0.3 1.4*  PROT 6.3 6.0  ALBUMIN 3.1* 3.0*   No results found for this basename: LIPASE, AMYLASE,  in the last 168 hours No results found for this basename: AMMONIA,  in the last 168 hours CBC:  Recent Labs Lab 08/01/14 2257 08/01/14 2305 08/02/14 0920 08/02/14 1615 08/03/14 0420 08/04/14 1420  WBC 9.6  --  11.1* 12.0* 11.0* 9.2  NEUTROABS 7.7  --  9.3*  --   --   --   HGB 5.8* 5.4* 8.2* 8.6* 8.2* 8.5*  HCT 19.3* 16.0* 26.4* 26.4* 25.7* 27.6*  MCV 80.1  --  83.3 81.0 83.2 83.6  PLT 242  --  214 210 186 200   Cardiac Enzymes:  Recent Labs Lab 08/01/14 2231 08/02/14 0926  TROPONINI 2.27* 4.13*   BNP (last 3 results)  Recent Labs  04/09/14 2101  PROBNP 678.0*   CBG:  Recent Labs Lab 08/04/14 2033 08/05/14 0110 08/05/14 0405 08/05/14 0801 08/05/14 1243  GLUCAP 107* 113* 101* 89 96    Recent Results (from the past 240 hour(s))  MRSA PCR SCREENING     Status: None   Collection Time    08/02/14  5:59 AM  Result Value Ref Range Status   MRSA by PCR NEGATIVE  NEGATIVE Final   Comment:            The GeneXpert MRSA Assay (FDA     approved for NASAL specimens     only), is one component of a     comprehensive MRSA colonization     surveillance program. It is not     intended to diagnose MRSA     infection nor to guide or     monitor treatment for     MRSA infections.     Studies: No results found.  Scheduled Meds: . insulin aspart  0-9 Units Subcutaneous TID WC  . metoprolol tartrate  25 mg Oral BID  . pantoprazole (PROTONIX) IV  40 mg Intravenous Q12H  . tiotropium  18 mcg Inhalation Daily   Continuous Infusions: . sodium chloride    . sodium chloride      Time spent: > 35 minutes    Velvet Bathe  [facility] Pager [phone]. If 7PM-7AM, please contact night-coverage at [email or web address], password [facility] 08/05/2014, 2:54 PM  LOS: 4  days

## 2014-08-06 ENCOUNTER — Encounter (HOSPITAL_COMMUNITY): Payer: Self-pay | Admitting: Physician Assistant

## 2014-08-06 ENCOUNTER — Encounter (HOSPITAL_COMMUNITY)
Admission: EM | Disposition: A | Discharge status: Home / Self Care | Payer: Self-pay | Source: Home / Self Care | Attending: Family Medicine

## 2014-08-06 DIAGNOSIS — I4892 Unspecified atrial flutter: Secondary | ICD-10-CM

## 2014-08-06 DIAGNOSIS — Z7901 Long term (current) use of anticoagulants: Secondary | ICD-10-CM

## 2014-08-06 HISTORY — PX: GIVENS CAPSULE STUDY: SHX5432

## 2014-08-06 LAB — GLUCOSE, CAPILLARY
GLUCOSE-CAPILLARY: 111 mg/dL — AB (ref 70–99)
GLUCOSE-CAPILLARY: 126 mg/dL — AB (ref 70–99)
GLUCOSE-CAPILLARY: 90 mg/dL (ref 70–99)
GLUCOSE-CAPILLARY: 95 mg/dL (ref 70–99)
Glucose-Capillary: 100 mg/dL — ABNORMAL HIGH (ref 70–99)
Glucose-Capillary: 112 mg/dL — ABNORMAL HIGH (ref 70–99)

## 2014-08-06 SURGERY — IMAGING PROCEDURE, GI TRACT, INTRALUMINAL, VIA CAPSULE
Anesthesia: LOCAL

## 2014-08-06 MED ORDER — BISACODYL 5 MG PO TBEC
10.0000 mg | DELAYED_RELEASE_TABLET | Freq: Once | ORAL | Status: AC
Start: 2014-08-06 — End: 2014-08-06
  Administered 2014-08-06: 10 mg via ORAL
  Filled 2014-08-06 (×2): qty 2

## 2014-08-06 SURGICAL SUPPLY — 1 items: TOWEL COTTON PACK 4EA (MISCELLANEOUS) ×4 IMPLANT

## 2014-08-06 NOTE — Progress Notes (Signed)
Patient requesting to take dulcolax EC 10mg  before she goes to sleep. Pt will let RN know when she is ready to take medication.

## 2014-08-06 NOTE — Progress Notes (Signed)
Pt refuses scds at this time

## 2014-08-06 NOTE — Progress Notes (Signed)
Per pt's family, pt was not taking Mobic at home. Cardiology was paged and made aware. Will continue to monitor.

## 2014-08-06 NOTE — Progress Notes (Signed)
Daily Rounding Note  08/06/2014, 9:33 AM  LOS: 5 days   SUBJECTIVE:       Still on clears. No BMs since bowel prep.  No bleeding PR.   No abd pain or nausea.   OBJECTIVE:         Vital signs in last 24 hours:    Temp:  [97.8 F (36.6 C)-98.5 F (36.9 C)] 98.5 F (36.9 C) (09/08 0805) Pulse Rate:  [68-86] 75 (09/08 0805) Resp:  [13-27] 15 (09/08 0805) BP: (111-217)/(34-111) 120/46 mmHg (09/08 0805) SpO2:  [85 %-96 %] 90 % (09/08 0805) Weight:  [70.126 kg (154 lb 9.6 oz)] 70.126 kg (154 lb 9.6 oz) (09/08 0620) Last BM Date: 08/05/14 General: looks  Well, NAD   Heart: RRR Chest: no dyspnea or cough.  Some ronchi on right Abdomen: soft, NT, ND.  No mass or HSM.  Active BS  Extremities: no SSE.  Ringworm patch on leg Neuro/Psych:  Oriented x 3.  No agitation.   Intake/Output from previous day: 09/07 0701 - 09/08 0700 In: 120 [P.O.:120] Out: 400 [Urine:400]  Intake/Output this shift:    Lab Results:  Recent Labs  08/04/14 1420  WBC 9.2  HGB 8.5*  HCT 27.6*  PLT 200   BMET No results found for this basename: NA, K, CL, CO2, GLUCOSE, BUN, CREATININE, CALCIUM,  in the last 72 hours LFT No results found for this basename: PROT, ALBUMIN, AST, ALT, ALKPHOS, BILITOT, BILIDIR, IBILI,  in the last 72 hours PT/INR No results found for this basename: LABPROT, INR,  in the last 72 hours Hepatitis Panel No results found for this basename: HEPBSAG, HCVAB, HEPAIGM, HEPBIGM,  in the last 72 hours  Studies/Results: No results found.  Scheduled Meds: . insulin aspart  0-9 Units Subcutaneous TID WC  . metoprolol tartrate  25 mg Oral BID  . pantoprazole (PROTONIX) IV  40 mg Intravenous Q12H  . tiotropium  18 mcg Inhalation Daily   Continuous Infusions: . sodium chloride    . sodium chloride     PRN Meds:.acetaminophen, acetaminophen, albuterol, LORazepam, nitroGLYCERIN, ondansetron (ZOFRAN) IV,  ondansetron   ASSESMENT:   *  Melena,  Anemia.  On Mobic, xarelto PTA.  Colonoscopy 9/7: signoid tics, small rectosigmoid polyp removed.  Large amount of residual stool so small lesions could have been missed.  EGD 9/6: white,  candidal looking plaques in esophagus: brushing obtained, small HH.  Awaiting fungal studies.  Hgb stable after the 2 units on 9/4.    *  Peripheral, cerebrovascular and coronary artery disease. Note elevated and rising Troponins of 9/3, 9/4:  May be due to demand ischemia.  No troponins since then.  Cardiology has not seen pt.   *  Afib, on chronic Xarelto. Currently on hold.   *  AKI.  Improved  *  Hypokalemia.    PLAN   *  Stop IV Protonix. No PPI PTA and relatively unremarkable EGD so do not feels she needs oral PPI. *  Capsule endo, to start at noon today.  will need to stay overnight for this to complete.  *  Waiting on results of fungal study from EGD.  *  Will need cardiac input re: xarelto I will call them.     Azucena Freed  08/06/2014, 9:33 AM Pager: [phone]   GI ATTENDING  History interval data reviewed. Endoscopy reports reviewed. Agree with H&P as above. Discussed with advanced practitioner. Will complete capsule endoscopy  with anticipated discharge in the morning. Agree with obtaining cardiology input regarding the need for long-term anticoagulation. She will followup in our office sometime in the near future after the capsule endoscopy study has been interpreted. Her primary gastroenterologist is Dr. Fuller Plan.  Docia Chuck. Geri Seminole., M.D. [facility] Division of Gastroenterology

## 2014-08-06 NOTE — Progress Notes (Signed)
CRYSTALANN KORF [id number]  Transfer Data: 08/06/2014 3:22 PM  Attending Provider: Velvet Bathe, MD  UVO:[id number] Pilar Plate, MD  Code Status: Full  [name] is a 73 y.o. female patient transferred from 3s  -No acute distress noted.  -No complaints of shortness of breath.  -No complaints of chest pain.   Blood pressure 117/52, pulse 74, temperature 97.9 F (36.6 C), temperature source Oral, resp. rate 17, height 5\' 2"  (1.575 m), weight 70.126 kg (154 lb 9.6 oz), SpO2 95.00%.  ?  IV Fluids: IV in place, occlusive dsg intact without redness, IV cath antecubital right, condition patent and no redness  none.  Allergies: Codeine phosphate  Past Medical History:  has a past medical history of ANEMIA (08/28/2010); CAD (coronary artery disease); COLONIC POLYPS, HX OF (06/15/2007); COPD (chronic obstructive pulmonary disease); Depressive disorder; GERD (12/19/2009); Hyperlipidemia; Hypertension; HYPOTENSION (08/11/2010); Osteoarthritis; PVD (peripheral vascular disease); Lung abscess (2011); CHF (congestive heart failure); colonoscopy; Paroxysmal atrial flutter; Aortic stenosis; and Diabetes mellitus without complication.  Past Surgical History:  has past surgical history that includes L subclavian bypass (07/2004); Tubal ligation; Coronary artery bypass graft (05/2000); femoral popliteal bypass-R (04/1999); right common iliac PTA with stent placement (07/2000); right leg blockage (2003); aorta bifemoral bypass graft (2004); left cea (2005); lung cyst biopsy (2011); and Esophagogastroduodenoscopy (N/A, 08/03/2014).  Social History:  reports that she quit smoking about 14 years ago. Her smoking use included Cigarettes. She has a 60 pack-year smoking history. She has never used smokeless tobacco. She reports that she does not drink alcohol or use illicit drugs.  Skin: intact, rash on right leg   Patient/Family orientated to room. Information packet given to patient/family. Admission inpatient armband  information verified with patient/family to include name and date of birth and placed on patient arm. Side rails up x 2, fall assessment and education completed with patient/family. Patient/family able to verbalize understanding of risk associated with falls and verbalized understanding to call for assistance before getting out of bed. Call light within reach. Patient/family able to voice and demonstrate understanding of unit orientation instructions.  Will continue to evaluate and treat per MD orders.

## 2014-08-06 NOTE — Progress Notes (Signed)
Report given to Rawson, RN on [facility]. Pt's VSS, no c/o pain, all due meds given. Central telemetry notified of transfer and monitor removed. Pt's family also made aware of transfer. Pt transferred to 5W-04 via wheelchair.

## 2014-08-06 NOTE — Progress Notes (Signed)
[facility] PROGRESS NOTE  [name] OZD:[id number] DOB: 04/08/1941 DOA: 08/01/2014 PCP: Nyoka Cowden, MD Brief narrative: 73 y/o with h/o Atrial fibrillation on xarelto, CAD s/p CABG in 2001, and COPD who presented to the hospital complaining of weakness and CC reported as frequent falls. Was found to be anemic and guiac positive in ED. GI consulted and working up.  Assessment/Plan: Active Problems:   Acute GI bleeding/ ANEMIA - GI on board and further evaluating patient. Plan is for colonoscopy on Monday 08/05/14 - Xarelto on hold  - reassess cbc next am. No BRBPR reported. - Report impression of colonoscopy is mild diverticulosis in the sigmoid colon, single diminutive polyp in the rectosigmoid colon which was removed by cold biopsy x2 - Plan is for capsule endoscopy 08/06/14. GI plans for d/c next am 08/07/14. Consulted cardiology for recommendations regarding xarelto    CAD, s/p CABG 2001 - stable, no chest discomfort reported.  Atrial fibrillation - On metoprolol for rate controlled - Xarelto on hold 2ary to acute GI bleed, Cardiology to discuss with [place] when if possible patient will be able to start anticoagulation.    COPD Gold B - stable, continue current regimen.   Code Status: full Family Communication: d/c with daughter and patient  Disposition Plan: Pending further evaluation and recommendations from specialist involved.   Consultants:  GI: Dr. Collene Mares  Procedures:  Endoscopy  Colonoscopy  Capsule endoscopy 08/06/14  Antibiotics:  None  HPI/Subjective: No acute issues reported overnight.  Objective: Filed Vitals:   08/06/14 1258  BP: 117/52  Pulse: 74  Temp:   Resp: 17    Intake/Output Summary (Last 24 hours) at 08/06/14 1314 Last data filed at 08/06/14 0953  Gross per 24 hour  Intake    360 ml  Output    700 ml  Net   -340 ml   Filed Weights   08/01/14 2225 08/02/14 0236 08/06/14 0620  Weight: 69.854 kg (154 lb) 71.9 kg (158  lb 8.2 oz) 70.126 kg (154 lb 9.6 oz)    Exam:   General:  Pt in nad, alert and awake  Cardiovascular: irregularly irregular, no murmurs  Respiratory: cta bl, no wheezes  Abdomen: soft, ND, NT  Musculoskeletal: no cyanosis or clubbing   Data Reviewed: Basic Metabolic Panel:  Recent Labs Lab 08/01/14 2257 08/01/14 2305 08/02/14 0920  NA 137 135* 139  K 3.2* 3.0* 3.6*  CL 97 98 103  CO2 22  --  21  GLUCOSE 79 81 73  BUN 34* 33* 27*  CREATININE 1.55* 1.60* 1.32*  CALCIUM 8.4  --  8.2*  MG 1.7  --   --    Liver Function Tests:  Recent Labs Lab 08/01/14 2257 08/02/14 0920  AST 26 32  ALT 19 19  ALKPHOS 72 72  BILITOT 0.3 1.4*  PROT 6.3 6.0  ALBUMIN 3.1* 3.0*   No results found for this basename: LIPASE, AMYLASE,  in the last 168 hours No results found for this basename: AMMONIA,  in the last 168 hours CBC:  Recent Labs Lab 08/01/14 2257 08/01/14 2305 08/02/14 0920 08/02/14 1615 08/03/14 0420 08/04/14 1420  WBC 9.6  --  11.1* 12.0* 11.0* 9.2  NEUTROABS 7.7  --  9.3*  --   --   --   HGB 5.8* 5.4* 8.2* 8.6* 8.2* 8.5*  HCT 19.3* 16.0* 26.4* 26.4* 25.7* 27.6*  MCV 80.1  --  83.3 81.0 83.2 83.6  PLT 242  --  214 210 186 200  Cardiac Enzymes:  Recent Labs Lab 08/01/14 2231 08/02/14 0926  TROPONINI 2.27* 4.13*   BNP (last 3 results)  Recent Labs  04/09/14 2101  PROBNP 678.0*   CBG:  Recent Labs Lab 08/05/14 2018 08/05/14 2358 08/06/14 0403 08/06/14 0809 08/06/14 1213  GLUCAP 102* 95 90 100* 112*    Recent Results (from the past 240 hour(s))  MRSA PCR SCREENING     Status: None   Collection Time    08/02/14  5:59 AM      Result Value Ref Range Status   MRSA by PCR NEGATIVE  NEGATIVE Final   Comment:            The GeneXpert MRSA Assay (FDA     approved for NASAL specimens     only), is one component of a     comprehensive MRSA colonization     surveillance program. It is not     intended to diagnose MRSA     infection nor to  guide or     monitor treatment for     MRSA infections.     Studies: No results found.  Scheduled Meds: . bisacodyl  10 mg Oral Once  . insulin aspart  0-9 Units Subcutaneous TID WC  . metoprolol tartrate  25 mg Oral BID  . tiotropium  18 mcg Inhalation Daily   Continuous Infusions:    Time spent: > 35 minutes    Velvet Bathe  [facility] Pager [phone]. If 7PM-7AM, please contact night-coverage at [email or web address], password [email or web address] 08/06/2014, 1:14 PM  LOS: 5 days

## 2014-08-06 NOTE — Progress Notes (Signed)
Patient's blood pressure 140/40 , HR 71. Notified provider on call who stated it would be ok to administer lopressor 25mg  and prn ativan 0.5mg . NP stated she would pass BP along to attendings. Will continue to monitor.

## 2014-08-06 NOTE — Progress Notes (Signed)
Capsule endoscopy consent obtained prior to arrival on unit by 3S nurse.

## 2014-08-06 NOTE — Progress Notes (Signed)
Patient requesting that peripheral IV site change not be changed tonight stating "I want to see if I go home tomorrow first. If not, then you can change it." IV site without complications. Will continue to monitor.

## 2014-08-06 NOTE — Consult Note (Signed)
CARDIOLOGY CONSULT NOTE   Patient ID: [name] MRN: [id number] DOB/AGE: November 26, 1941 73 y.o.  Admit date: 08/01/2014  Primary Physician   Alexandra Cowden, MD Primary Cardiologist   Dr. Meda Coffee, Dr. Lovena Le Reason for Consultation  Elevated troponin, anticoagulation  XKG:YJEHU C [name] is a 73 y.o. female with a history of CABG 2001, PAF on amio and Xarelto,  s/p cath 02/2014 w/ medical therapy, results below. She has subclavian dz, carotid dz, s/p CVA 03/2014, DM, HTN, COPD.   She was admitted 09/03 with melena and anemia. Xarelto and Mobic held. Troponin elevated. GI has seen and EGD w/ possible esophageal Candida, colonoscopy w/ residual stool, but small polyp removed. Capsule endoscopy pending. Cardiology asked to see to determine the need for anticoagulation, and elevated cardiac enzymes.  [name] has noticed dark tarry stools for a couple of weeks. She did not become acutely ill, with diarrhea, until shortly before admission. She does not recognize the names Mobic or meloxicam. She was supposed to discontinue the meloxicam per the discharge summary from May 2015.  She has chronic dyspnea on exertion. This was consistent but she was not having orthopnea or PND. She has inhalers at home for her COPD, Spiriva and possibly albuterol, but does not use them daily. She had some increasing shortness of breath just prior to admission, and got a dose of Lasix 40 mg IV on admission. She also had some shortness of breath in the hospital. In the hospital, this was felt secondary to volume overload and COPD. She responded to discontinuation of IV fluids plus a nebulizer treatment.   She has not had chest pain. She has mentioned palpitations a couple of times to her daughter, but the patient cannot remember any specific instances. She has also fallen.   The first time she remembers falling is prior to her admission in May 2015, when she was diagnosed with a CVA, new onset  diabetes, and cerebrovascular disease. She also had episodes of paroxysmal atrial fibrillation with rapid ventricular response during that admission. She fell prior to this admission, the one time it was witnessed. She describes trying to do something and then feeling as though she was falling but was not going to be able to stop herself. She denies a mechanical fall and denies loss of consciousness. She may also have fallen once or twice between May 2015, and now, but she cannot remember any details.  She was anemic during her admission in May with a hemoglobin of 8.8, hematocrit 27.6. Her iron level was low at 29 with low saturation at 6%, B12, folate and ferritin normal. Her reticulocyte count was normal. At discharge, she had been taken off the Mobic and the aspirin, but was still on Xarelto, started in March 2015. No stool guaiacs were recorded. She has not been on iron supplementation.  Past Medical History  Diagnosis Date  . ANEMIA 08/28/2010  . CAD (coronary artery disease)     a. s/p CABGx4 in 2001.  Marland Kitchen COLONIC POLYPS, HX OF 06/15/2007  . COPD (chronic obstructive pulmonary disease)   . Depressive disorder   . GERD 12/19/2009  . Hyperlipidemia   . Hypertension   . HYPOTENSION 08/11/2010  . Osteoarthritis     Lumbar stenosis  . PVD (peripheral vascular disease)     a. Duplex 03/2013: stable moderate carotid dz - 31-49% RICA, 70-26% LICA, L subclavian artery to CCA stent widely patent.  b. Prior R fempop bypass graft, R CIA stent.  Marland Kitchen  Lung abscess 2011  . CHF (congestive heart failure)   . Hx of colonoscopy   . Paroxysmal atrial flutter     a. Dx 01/2014, placed on Xarelto.  . Aortic stenosis     a. Mild by echo 01/2013.  . Diabetes mellitus without complication      Past Surgical History  Procedure Laterality Date  . L subclavian bypass  07/2004  . Tubal ligation    . Coronary artery bypass graft  05/2000    LIMA-LAD, SVG-OM, SVG-PDA-PL  . Femoral popliteal bypass-r  04/1999  . Right  common iliac pta with stent placement  07/2000  . Right leg blockage  2003  . Aorta bifemoral bypass graft  2004  . Left cea  2005  . Lung cyst biopsy  2011  . Esophagogastroduodenoscopy N/A 08/03/2014    Procedure: ESOPHAGOGASTRODUODENOSCOPY (EGD);  Surgeon: Juanita Craver, MD;  Location: [facility] ENDOSCOPY;  Service: Endoscopy;  Laterality: N/A;    Allergies  Allergen Reactions  . Codeine Phosphate     REACTION: unspecified    I have reviewed the patient's current medications . bisacodyl  10 mg Oral Once  . insulin aspart  0-9 Units Subcutaneous TID WC  . metoprolol tartrate  25 mg Oral BID  . tiotropium  18 mcg Inhalation Daily     acetaminophen, acetaminophen, albuterol, LORazepam, nitroGLYCERIN, ondansetron (ZOFRAN) IV, ondansetron  Medication Sig  amiodarone (PACERONE) 200 MG tablet Take 1 tablet (200 mg total) by mouth daily.  atorvastatin (LIPITOR) 40 MG tablet Take 1 tablet (40 mg total) by mouth every evening.  cilostazol (PLETAL) 100 MG tablet Take 100 mg by mouth 2 (two) times daily.  diltiazem (CARDIZEM CD) 120 MG 24 hr capsule Take 1 capsule by mouth daily.  furosemide (LASIX) 40 MG tablet Take 0.5 tablets (20 mg total) by mouth daily.  gabapentin (NEURONTIN) 600 MG tablet Take 1 tablet (600 mg total) by mouth 3 (three) times daily.  glimepiride (AMARYL) 1 MG tablet Take 1 mg by mouth daily with breakfast.  HYDROcodone-acetaminophen (NORCO/VICODIN) 5-325 MG per tablet Take 0.5 tablets by mouth every 6 (six) hours as needed for moderate pain.  LORazepam (ATIVAN) 0.5 MG tablet Take 0.5 mg by mouth 2 (two) times daily.  losartan (COZAAR) 50 MG tablet Take 1 tablet (50 mg total) by mouth daily.  meloxicam (MOBIC) 7.5 MG tablet Take 7.5 mg by mouth daily.   metFORMIN (GLUCOPHAGE) 500 MG tablet Take 1 tablet (500 mg total) by mouth 2 (two) times daily with a meal.  metoprolol (LOPRESSOR) 50 MG tablet Take 25 mg by mouth 2 (two) times daily.   Multiple Vitamins-Minerals (CENTRUM  SILVER ULTRA WOMENS PO) Take 1 capsule by mouth daily.   nitroGLYCERIN (NITROSTAT) 0.4 MG SL tablet Place 0.4 mg under the tongue every 5 (five) minutes as needed for chest pain.  potassium chloride (K-DUR) 10 MEQ tablet Take 1 tablet (10 mEq total) by mouth daily.  promethazine (PHENERGAN) 25 MG tablet Take 1 tablet (25 mg total) by mouth every 6 (six) hours as needed for nausea or vomiting.  rivaroxaban (XARELTO) 20 MG TABS tablet Take 1 tablet (20 mg total) by mouth daily with supper.  sertraline (ZOLOFT) 50 MG tablet Take 25 mg by mouth daily.   tiotropium (SPIRIVA) 18 MCG inhalation capsule Place 1 capsule (18 mcg total) into inhaler and inhale daily.     History   Social History  . Marital Status: Widowed    Spouse Name: N/A    Number of  Children: 2  . Years of Education: N/A   Occupational History  . Receptionist    Social History Main Topics  . Smoking status: Former Smoker -- 1.50 packs/day for 40 years    Types: Cigarettes    Quit date: 06/29/2000  . Smokeless tobacco: Never Used  . Alcohol Use: No  . Drug Use: No  . Sexual Activity: Not on file   Other Topics Concern  . Not on file   Social History Narrative  . No narrative on file    Family Status  Relation Status Death Age  . Mother Deceased   . Father Deceased    Family History  Problem Relation Age of Onset  . Diabetes Father   . Heart attack Mother   . Diabetes    . Coronary artery disease    . Colon cancer Neg Hx      ROS:  Full 14 point review of systems complete and found to be negative unless listed above.  Physical Exam: Blood pressure 120/46, pulse 75, temperature 98.5 F (36.9 C), temperature source Oral, resp. rate 15, height 5\' 2"  (1.575 m), weight 154 lb 9.6 oz (70.126 kg), SpO2 90.00%.  General: Well developed, well nourished, female in no acute distress Head: Eyes PERRLA, No xanthomas.   Normocephalic and atraumatic, oropharynx without edema or exudate. Dentition: Poor Lungs: Few  rales bases Heart: HRRR S1 S2, no rub/gallop, no murmur. pulses are 2+ all 4 extrem.   Neck: No carotid bruits. No lymphadenopathy.  JVD not elevated. Abdomen: Bowel sounds present, abdomen soft and non-tender without masses or hernias noted. Msk:  No spine or cva tenderness. No weakness, no joint deformities or effusions. Extremities: No clubbing or cyanosis. No edema.  Neuro: Alert and oriented X 3. No focal deficits noted. Memory is poor, daughter states this is a chronic problem Psych:  Good affect, responds appropriately Skin: No rashes or lesions noted.  Labs:   Lab Results  Component Value Date   WBC 9.2 08/04/2014   HGB 8.5* 08/04/2014   HCT 27.6* 08/04/2014   MCV 83.6 08/04/2014   PLT 200 08/04/2014     Recent Labs Lab 08/02/14 0920  NA 139  K 3.6*  CL 103  CO2 21  BUN 27*  CREATININE 1.32*  CALCIUM 8.2*  PROT 6.0  BILITOT 1.4*  ALKPHOS 72  ALT 19  AST 32  GLUCOSE 73  ALBUMIN 3.0*   Magnesium  Date Value Ref Range Status  08/01/2014 1.7  1.5 - 2.5 mg/dL Final   Troponin I  Date Value Ref Range Status  08/02/2014 4.13* <0.30 ng/mL Final  08/01/2014 2.27* <0.30 ng/mL Final   Echo: 04/11/2014 Study Conclusions - Left ventricle: The cavity size was normal. Wall thickness was normal. Systolic function was normal. The estimated ejection fraction was in the range of 50% to 55%. Moderate hypokinesis of the mid-distalanteroseptal myocardium. Features are consistent with a pseudonormal left ventricular filling pattern, with concomitant abnormal relaxation and increased filling pressure (grade 2 diastolic dysfunction). - Aortic valve: Mildly to moderately calcified annulus. Mildly calcified leaflets. Valve area: 0.96cm^2(VTI). Valve area: 1cm^2 (Vmax). - Mitral valve: Mild regurgitation. - Left atrium: The atrium was severely dilated. - Right ventricle: The cavity size was mildly dilated. Systolic function was mildly reduced. - Pulmonary arteries: Systolic pressure was  mildly increased. PA peak pressure: 76mm Hg (S).  ECG:  08/01/2014 SR, lateral T wave changes from 06/2014 Vent. rate 79 BPM PR interval 171 ms QRS duration 102 ms  QT/QTc 431/494 ms P-R-T axes 156 0 187  Cardiac Cath: 03/11/2014 Left mainstem: 100% occlusion at the ostium  Left anterior descending (LAD): Not visualized.  Left circumflex (LCx): Not visualized  Right coronary artery (RCA): 100% in the mid vessel.  The SVG to the OM is widely patent.  The SVG to the RCA is widely patent. There are some collaterals to the first septal perforator from the RCA.  Left ventriculography: Left ventricular systolic function is normal, LVEF is estimated at 6065%, there is no significant mitral regurgitation  Given her known left subclavian occlusion we next attempted to access the IMA graft from a left radial approach. The left wrist was prepped and draped in a sterile fashion and was anesthetized with 1% lidocaine. Using ultrasound we attempted to access the left radial artery. The artery was small on ultrasound. Despite engaging the artery 3 times with the needle I was unable to pass the wire into the artery and access could not be obtained.  Final Conclusions:  1. 3 vessel occlusive CAD with left main occlusion.  2. Patent SVG to the RCA  3. Patent SVG to the OM  4. Normal LV function.  5. Unable to access left IMA graft due to inability to access the left radial artery.  Recommendations: Medical management. I would consider CTA to confirm IMA patency and assess functionality of the left carotid to subclavian bypass graft.  (Cardiac CT never performed)  Radiology:  Dg Chest Port 1 View 08/02/2014   CLINICAL DATA:  Shortness of Breath  EXAM: PORTABLE CHEST - 1 VIEW  COMPARISON:  08/01/2014  FINDINGS: Prior CABG. Heart is borderline in size. Diffuse interstitial prominence throughout the lungs, increased since prior study, likely interstitial edema. No confluent opacities or effusions. No acute bony  abnormality.  IMPRESSION: Increasing interstitial prominence, likely interstitial edema.   Electronically Signed   By: Rolm Baptise M.D.   On: 08/02/2014 07:11   Dg Chest Portable 1 View 08/02/2014   CLINICAL DATA:  MELENA FALL  EXAM: PORTABLE CHEST - 1 VIEW  COMPARISON:  04/09/2014  FINDINGS: Previous CABG. Heart size upper limits normal. Patchy interstitial opacities in both lung bases slightly more conspicuous than on prior study. No focal airspace disease. No overt edema. No effusion. Atheromatous aorta.  IMPRESSION: 1. Probable mild bibasilar atelectasis. Otherwise stable appearance since previous exam.   Electronically Signed   By: Arne Cleveland M.D.   On: 08/02/2014 00:55    ASSESSMENT AND PLAN:   The patient was seen today by Dr. Percival Spanish, the patient evaluated and the data reviewed.     Acute GI bleeding - management per GI and IM. She has had EGD with probable Candida and colonoscopy with one small polyp removed. Capsule endoscopy results are pending  Active Problems:   ANEMIA - history of iron deficiency, likely secondary to acute blood loss this admission, management per IM.    CAD, s/p CABG 2001 - on beta blocker and statin, no aspirin secondary to GI bleed, no ongoing ischemic symptoms    COPD Gold B - per IM, still requiring O2.  Demand ischemia - Likely secondary to anemia and GI bleed. Currently cannot anticoagulate and if she is having no ongoing ischemic symptoms, so continue statin and beta blocker. M.D. advise on checking an echocardiogram. EF was 50% in May 2015. See cath report from April 2015, above.    PAF - 51 arrhythmia episodes noted on event monitor worn in April 2015. These included multiple episodes  of atrial fib and atrial flutter, as well as possible SVT and PVCs. Keep on telemetry while hospitalized. Currently maintaining sinus rhythm.  Ms. DULCEY RIEDERER has a CHA2DS2 - VASc score of 7 with a risk of stroke of 9.6%  and a HAS - BLED score of 3 with a high risk  of bleeding.   SignedRosaria Ferries, PA-C 08/06/2014 11:11 AM Beeper [phone]  Co-Sign MD  History and all data above reviewed.  Patient examined.  I agree with the findings as above.  The patient has a GI bleed as described above.  She has PAF and CAD.  Her troponin is mildly elevated.  However, she has no clinical evidence of NSTEMI.  Rather this would be demand ischemia.  She will be off of NOAC and NSAID.  The patient exam reveals COR:RRR  ,  Lungs: Clear  ,  Abd: Positive bowel sounds, no rebound no guarding, Ext no edema  .  All available labs, radiology testing, previous records reviewed. Agree with documented assessment and plan. Atrial fib:  She cannot be on the NOAC for now.  However, Dr. Meda Coffee can discuss this with her in the months to come.  She will stop Mobic.  CAD:  Demand ischemia. No symptoms. She does have an abnormal EKG.  However, this was at presentation during the profound anemia.  Repeat an EKG.  Consider repeat echo pending this result.       Jeneen Rinks Laiklynn Raczynski  1:29 PM  08/06/2014

## 2014-08-07 ENCOUNTER — Encounter (HOSPITAL_COMMUNITY): Payer: Self-pay | Admitting: Gastroenterology

## 2014-08-07 ENCOUNTER — Other Ambulatory Visit: Payer: Self-pay | Admitting: Physician Assistant

## 2014-08-07 ENCOUNTER — Telehealth: Payer: Self-pay | Admitting: Internal Medicine

## 2014-08-07 DIAGNOSIS — E118 Type 2 diabetes mellitus with unspecified complications: Secondary | ICD-10-CM

## 2014-08-07 DIAGNOSIS — I739 Peripheral vascular disease, unspecified: Secondary | ICD-10-CM

## 2014-08-07 DIAGNOSIS — I25810 Atherosclerosis of coronary artery bypass graft(s) without angina pectoris: Secondary | ICD-10-CM

## 2014-08-07 DIAGNOSIS — I10 Essential (primary) hypertension: Secondary | ICD-10-CM

## 2014-08-07 DIAGNOSIS — E876 Hypokalemia: Secondary | ICD-10-CM

## 2014-08-07 LAB — CBC
HCT: 28.3 % — ABNORMAL LOW (ref 36.0–46.0)
HEMOGLOBIN: 8.6 g/dL — AB (ref 12.0–15.0)
MCH: 26 pg (ref 26.0–34.0)
MCHC: 30.4 g/dL (ref 30.0–36.0)
MCV: 85.5 fL (ref 78.0–100.0)
Platelets: 205 10*3/uL (ref 150–400)
RBC: 3.31 MIL/uL — ABNORMAL LOW (ref 3.87–5.11)
RDW: 18 % — ABNORMAL HIGH (ref 11.5–15.5)
WBC: 7 10*3/uL (ref 4.0–10.5)

## 2014-08-07 LAB — GLUCOSE, CAPILLARY
GLUCOSE-CAPILLARY: 139 mg/dL — AB (ref 70–99)
Glucose-Capillary: 114 mg/dL — ABNORMAL HIGH (ref 70–99)

## 2014-08-07 MED ORDER — FLUCONAZOLE 100 MG PO TABS: 100.0000 mg | ORAL_TABLET | Freq: Every day | ORAL | Status: DC

## 2014-08-07 MED ORDER — FLUCONAZOLE 100 MG PO TABS
100.0000 mg | ORAL_TABLET | Freq: Every day | ORAL | Status: DC
  Administered 2014-08-07: 100 mg via ORAL
  Filled 2014-08-07: qty 1

## 2014-08-07 NOTE — Progress Notes (Signed)
          Daily Rounding Note  08/07/2014, 10:05 AM  LOS: 6 days   SUBJECTIVE:       Stool brown.  Feels well.  Eating solids  OBJECTIVE:         Vital signs in last 24 hours:    Temp:  [97.9 F (36.6 C)-98.6 F (37 C)] 98.6 F (37 C) (09/09 0177) Pulse Rate:  [66-74] 66 (09/09 0614) Resp:  [13-17] 13 (09/09 0614) BP: (117-156)/(40-52) 156/48 mmHg (09/09 0614) SpO2:  [93 %-97 %] 94 % (09/09 0614) Weight:  [72.167 kg (159 lb 1.6 oz)] 72.167 kg (159 lb 1.6 oz) (09/08 1531) Last BM Date: 08/07/14 General: looks well.  comfortable   Heart: RRR Chest: clear bil Abdomen: soft, active BS, NT, ND  Extremities: no CCE Neuro/Psych:  Oriented x 3.  Appropriate. No gross weakness or deficits.   Intake/Output from previous day: 09/08 0701 - 09/09 0700 In: 480 [P.O.:480] Out: 300 [Urine:300]  Intake/Output this shift:    Lab Results:  Recent Labs  08/04/14 1420 08/07/14 0638  WBC 9.2 7.0  HGB 8.5* 8.6*  HCT 27.6* 28.3*  PLT 200 205     ASSESMENT:   * Melena, . On Mobic, xarelto PTA.  Colonoscopy 9/7: signoid tics, small rectosigmoid polyp removed. Large amount of residual stool so small lesions could have been missed.  EGD 9/6: white, candidal looking plaques in esophagus: brushing obtained, small HH. Awaiting fungal studies.  Capsule endo 9/8-9/9.  Will be read in next few days  *  Anemia.  Hgb stable after the 2 units on 9/4.  * Peripheral, cerebrovascular and coronary artery disease.  Elevated Troponins of 9/3, 9/4: demand ischemia per cardiology.  * Afib, on chronic Xarelto. Currently on hold. Not clear if/ when this will be restarted. Seen by Dr Percival Spanish 9/8 * AKI. Improved     PLAN   *  Capsule endo to be read in next 48 hours but ok to discharge before that.  Has ROV with GI on 9/28 at 3:15 with Dr Fuller Plan.  Plan is to discharge off Xarelto and follow up with GI and cards.  *  No need of PPI.     Azucena Freed  08/07/2014, 10:05 AM Pager: [phone]  GI ATTENDING  Interval history data reviewed. Agree with H&P as outlined above. Patient has cardiology followup as well as GI followup. Capsule endoscopy pending. Anticipate discharge.  Docia Chuck. Geri Seminole., M.D. [facility] Division of Gastroenterology

## 2014-08-07 NOTE — Telephone Encounter (Signed)
Pt has hosp fup on 9/21. However, pt is a diabetic and daughter is concerned about pt foot/toe which has turned color and pt has some heel pain. Request to get it looked at asap,  Dr Raliegh Ip out of office thurs and fri.  Pt aware this is to only get foot looked at, nothing to do w/ hopsital fu. Will keep 9/21 OV for hops fu. Scheduled acute visit only on thurs. OK?

## 2014-08-07 NOTE — Progress Notes (Signed)
Patient: [name] / Admit Date: 08/01/2014 / Date of Encounter: 08/07/2014, 10:34 AM   Subjective: No CP or SOB. Feeling much better. Stool is brown.   Objective: Telemetry: N/A Physical Exam: Blood pressure 156/48, pulse 66, temperature 98.6 F (37 C), temperature source Oral, resp. rate 13, height 5\' 2"  (1.575 m), weight 159 lb 1.6 oz (72.167 kg), SpO2 94.00%. General: Well developed, well nourished pale WF, in no acute distress. Head: Normocephalic, atraumatic, sclera non-icteric, no xanthomas, nares are without discharge. Neck: Negative for carotid bruits. JVP not elevated. Lungs: Clear bilaterally to auscultation without wheezes, rales, or rhonchi. Breathing is unlabored. Heart: RRR S1 S2 2/6 SEM at RUSB without rubs or gallops.  Abdomen: Soft, non-tender, non-distended with normoactive bowel sounds. No rebound/guarding. Extremities: No clubbing or cyanosis. No edema. Distal pedal pulses are 2+ and equal bilaterally. Neuro: Alert and oriented X 3. Moves all extremities spontaneously. Psych:  Responds to questions appropriately with a normal affect.   Intake/Output Summary (Last 24 hours) at 08/07/14 1034 Last data filed at 08/06/14 2120  Gross per 24 hour  Intake    240 ml  Output      0 ml  Net    240 ml    Inpatient Medications:  . insulin aspart  0-9 Units Subcutaneous TID WC  . metoprolol tartrate  25 mg Oral BID  . tiotropium  18 mcg Inhalation Daily   Infusions:    Labs: No results found for this basename: NA, K, CL, CO2, GLUCOSE, BUN, CREATININE, CALCIUM, MG, PHOS,  in the last 72 hours No results found for this basename: AST, ALT, ALKPHOS, BILITOT, PROT, ALBUMIN,  in the last 72 hours  Recent Labs  08/04/14 1420 08/07/14 0638  WBC 9.2 7.0  HGB 8.5* 8.6*  HCT 27.6* 28.3*  MCV 83.6 85.5  PLT 200 205   Radiology/Studies:  Dg Chest Port 1 View  08/02/2014   CLINICAL DATA:  Shortness of Breath  EXAM: PORTABLE CHEST - 1 VIEW  COMPARISON:  08/01/2014   FINDINGS: Prior CABG. Heart is borderline in size. Diffuse interstitial prominence throughout the lungs, increased since prior study, likely interstitial edema. No confluent opacities or effusions. No acute bony abnormality.  IMPRESSION: Increasing interstitial prominence, likely interstitial edema.   Electronically Signed   By: Rolm Baptise M.D.   On: 08/02/2014 07:11   Dg Chest Portable 1 View  08/02/2014   CLINICAL DATA:  MELENA FALL  EXAM: PORTABLE CHEST - 1 VIEW  COMPARISON:  04/09/2014  FINDINGS: Previous CABG. Heart size upper limits normal. Patchy interstitial opacities in both lung bases slightly more conspicuous than on prior study. No focal airspace disease. No overt edema. No effusion. Atheromatous aorta.  IMPRESSION: 1. Probable mild bibasilar atelectasis. Otherwise stable appearance since previous exam.   Electronically Signed   By: Arne Cleveland M.D.   On: 08/02/2014 00:55     Assessment and Plan  1. Symptomatic acute on chronic anemia with acute GIB - colonoscopy with polyp removed; EGD with candidiasis-appearance, capsule endoscopy results pending. Xarelto stopped. Hemoglobin stable s/p transfusion. Per GI. 2. CAD s/p CABG 2001 with demand ischemia this admission (peak troponin 4) - cath 02/2014 with patent SVG-RCA, patent SVG-OM. There were plans for cardiac CT to assess IMA patency however patient was diagnosed with CVA in the interim and this was deferred due to lack of anginal symptoms. She denies CP. Suspect due to GIB but we will reassess as outpatient. Primary team plans to resume BB, statin.  If the decision is made to avoid Xarelto long term would consider placing back on ASA (will d/w MD). F/u EKG this AM appears improved now that her blood volume has improved. 3. PAF/flutter - maintaining NSR. Xarelto held due to #1. Resuming amiodarone, metoprolol, diltiazem at discharge. CHA2DS2 - VASc score of 7 with a risk of stroke of 9.6% and a HAS - BLED score of 3 with a high risk of  bleeding. Dr. Meda Coffee will see her back in the clinic in several weeks to determine suitability for resumption of NOAC (the patient will also f/u GI). 4. AKI - primary team plans on holding Cozaar at d/c. 5. HTN - primary team resuming home Diltiazem today upon discharge. Continues on metoprolol. 6. Systolic murmur - present prior to this admission. Echo 03/2014 with mild-mod calcified aortic annulus, mild MR. Do not see a reason to repeat right now.  Dr. Meda Coffee to see to make sure she agrees with the plan (she will see patient very shortly). Has f/u with Dr. Lovena Le on 9/21 and Dr. Meda Coffee 08/26/14 at 12pm. I have asked office to add a BMET to 9/21 visit.  Signed, Melina Copa PA-C   The patient was seen, examined and discussed with Melina Copa, PA-C and I agree with the above.   73 year old female well known to me for CAD, parox atrial flutter and fibrillation controlled by amiodarone on chronic anticoagulation with Xarelto who was admitted with acute GIB and anemia. Colonoscopy showed a polyp that was removed, capsule endoscopy is pending.   We will hold Xarelto, restart amiodarone 200 mg po daily metoprolol 25 mg po BID and diltiazem CD 120 mg PO QD. She has a follow up appointment scheduled with Dr Lovena Le on 08/19/2014.  Dorothy Spark 08/07/2014

## 2014-08-07 NOTE — Progress Notes (Signed)
08/07/14 Patient discharged home with daughter, IV site removed and discharge instructions reviewed with patient and daughter.

## 2014-08-07 NOTE — Telephone Encounter (Signed)
Noted, pt seeing  Padonda on Thursday.

## 2014-08-07 NOTE — Discharge Summary (Addendum)
PATIENT DETAILS Name: [name] Age: 73 y.o. Sex: female Date of Birth: 03-Aug-1941 MRN: [id number]. Admitting Physician: Rise Patience, MD IRJ:[id number] Pilar Plate, MD  Admit Date: 08/01/2014 Discharge date: 08/07/2014  Recommendations for Outpatient Follow-up:  1. Repeat CBC at next visit.  2. Please consider starting iron supplementation at next visit-holding off for now- as patient concerned about melena recurring and blaming it on Fe supplementation.   PRIMARY DISCHARGE DIAGNOSIS:  Principal Problem:   Acute GI bleeding Active Problems:   ACUTE BLOOD LOSS ANEMIA   Candida Esophagitis   CAD, s/p CABG 2001   COPD Gold B   Demand ischemia      PAST MEDICAL HISTORY: Past Medical History  Diagnosis Date  . ANEMIA 08/28/2010  . CAD (coronary artery disease)     a. s/p CABGx4 in 2001.  Marland Kitchen COLONIC POLYPS, HX OF 06/15/2007  . COPD (chronic obstructive pulmonary disease)   . Depressive disorder   . GERD 12/19/2009  . Hyperlipidemia   . Hypertension   . HYPOTENSION 08/11/2010  . Osteoarthritis     Lumbar stenosis  . PVD (peripheral vascular disease)     a. Duplex 03/2013: stable moderate carotid dz - 93-23% RICA, 55-73% LICA, L subclavian artery to CCA stent widely patent.  b. Prior R fempop bypass graft, R CIA stent.  . Lung abscess 2011  . CHF (congestive heart failure)   . Hx of colonoscopy   . Paroxysmal atrial flutter     a. Dx 01/2014, placed on Xarelto.  . Aortic stenosis     a. Mild by echo 01/2013.  . Diabetes mellitus without complication     DISCHARGE MEDICATIONS:   Medication List    STOP taking these medications       losartan 50 MG tablet  Commonly known as:  COZAAR     meloxicam 7.5 MG tablet  Commonly known as:  MOBIC     rivaroxaban 20 MG Tabs tablet  Commonly known as:  XARELTO      TAKE these medications       amiodarone 200 MG tablet  Commonly known as:  PACERONE  Take 1 tablet (200 mg total) by mouth daily.     atorvastatin 40 MG tablet  Commonly known as:  LIPITOR  Take 1 tablet (40 mg total) by mouth every evening.     CENTRUM SILVER ULTRA WOMENS PO  Take 1 capsule by mouth daily.     cilostazol 100 MG tablet  Commonly known as:  PLETAL  Take 100 mg by mouth 2 (two) times daily.     diltiazem 120 MG 24 hr capsule  Commonly known as:  CARDIZEM CD  Take 1 capsule by mouth daily.     fluconazole 100 MG tablet  Commonly known as:  DIFLUCAN  Take 1 tablet (100 mg total) by mouth daily.     furosemide 40 MG tablet  Commonly known as:  LASIX  Take 0.5 tablets (20 mg total) by mouth daily.     gabapentin 600 MG tablet  Commonly known as:  NEURONTIN  Take 1 tablet (600 mg total) by mouth 3 (three) times daily.     glimepiride 1 MG tablet  Commonly known as:  AMARYL  Take 1 mg by mouth daily with breakfast.     HYDROcodone-acetaminophen 5-325 MG per tablet  Commonly known as:  NORCO/VICODIN  Take 0.5 tablets by mouth every 6 (six) hours as needed for moderate pain.     LORazepam 0.5  MG tablet  Commonly known as:  ATIVAN  Take 0.5 mg by mouth 2 (two) times daily.     metFORMIN 500 MG tablet  Commonly known as:  GLUCOPHAGE  Take 1 tablet (500 mg total) by mouth 2 (two) times daily with a meal.     metoprolol 50 MG tablet  Commonly known as:  LOPRESSOR  Take 25 mg by mouth 2 (two) times daily.     nitroGLYCERIN 0.4 MG SL tablet  Commonly known as:  NITROSTAT  Place 0.4 mg under the tongue every 5 (five) minutes as needed for chest pain.     potassium chloride 10 MEQ tablet  Commonly known as:  K-DUR  Take 1 tablet (10 mEq total) by mouth daily.     promethazine 25 MG tablet  Commonly known as:  PHENERGAN  Take 1 tablet (25 mg total) by mouth every 6 (six) hours as needed for nausea or vomiting.     sertraline 50 MG tablet  Commonly known as:  ZOLOFT  Take 25 mg by mouth daily.     tiotropium 18 MCG inhalation capsule  Commonly known as:  SPIRIVA  Place 1 capsule (18 mcg  total) into inhaler and inhale daily.        ALLERGIES:   Allergies  Allergen Reactions  . Codeine Phosphate     REACTION: unspecified    BRIEF HPI:  See H&P, Labs, Consult and Test reports for all details in brief, patient was admitted for evaluation of frequent falls, she was also found to have black tarry stools. In the emergency room she was found to have a hemoglobin of 5. She was admitted for further evaluation and treatment  CONSULTATIONS:   cardiology and GI  PERTINENT RADIOLOGIC STUDIES: Dg Chest Port 1 View  08/02/2014   CLINICAL DATA:  Shortness of Breath  EXAM: PORTABLE CHEST - 1 VIEW  COMPARISON:  08/01/2014  FINDINGS: Prior CABG. Heart is borderline in size. Diffuse interstitial prominence throughout the lungs, increased since prior study, likely interstitial edema. No confluent opacities or effusions. No acute bony abnormality.  IMPRESSION: Increasing interstitial prominence, likely interstitial edema.   Electronically Signed   By: Rolm Baptise M.D.   On: 08/02/2014 07:11   Dg Chest Portable 1 View  08/02/2014   CLINICAL DATA:  MELENA FALL  EXAM: PORTABLE CHEST - 1 VIEW  COMPARISON:  04/09/2014  FINDINGS: Previous CABG. Heart size upper limits normal. Patchy interstitial opacities in both lung bases slightly more conspicuous than on prior study. No focal airspace disease. No overt edema. No effusion. Atheromatous aorta.  IMPRESSION: 1. Probable mild bibasilar atelectasis. Otherwise stable appearance since previous exam.   Electronically Signed   By: Arne Cleveland M.D.   On: 08/02/2014 00:55     PERTINENT LAB RESULTS: CBC:  Recent Labs  08/04/14 1420 08/07/14 0638  WBC 9.2 7.0  HGB 8.5* 8.6*  HCT 27.6* 28.3*  PLT 200 205   CMET CMP     Component Value Date/Time   NA 139 08/02/2014 0920   K 3.6* 08/02/2014 0920   CL 103 08/02/2014 0920   CO2 21 08/02/2014 0920   GLUCOSE 73 08/02/2014 0920   GLUCOSE 91 10/31/2006 0948   BUN 27* 08/02/2014 0920   CREATININE 1.32*  08/02/2014 0920   CALCIUM 8.2* 08/02/2014 0920   CALCIUM 10.8* 04/09/2014 2101   PROT 6.0 08/02/2014 0920   ALBUMIN 3.0* 08/02/2014 0920   AST 32 08/02/2014 0920   ALT 19 08/02/2014 0920  ALKPHOS 72 08/02/2014 0920   BILITOT 1.4* 08/02/2014 0920   GFRNONAA 39* 08/02/2014 0920   GFRAA 45* 08/02/2014 0920    GFR Estimated Creatinine Clearance: 35.8 ml/min (by C-G formula based on Cr of 1.32). No results found for this basename: LIPASE, AMYLASE,  in the last 72 hours No results found for this basename: CKTOTAL, CKMB, CKMBINDEX, TROPONINI,  in the last 72 hours No components found with this basename: POCBNP,  No results found for this basename: DDIMER,  in the last 72 hours No results found for this basename: HGBA1C,  in the last 72 hours No results found for this basename: CHOL, HDL, LDLCALC, TRIG, CHOLHDL, LDLDIRECT,  in the last 72 hours No results found for this basename: TSH, T4TOTAL, FREET3, T3FREE, THYROIDAB,  in the last 72 hours No results found for this basename: VITAMINB12, FOLATE, FERRITIN, TIBC, IRON, RETICCTPCT,  in the last 72 hours Coags: No results found for this basename: PT, INR,  in the last 72 hours Microbiology: Recent Results (from the past 240 hour(s))  MRSA PCR SCREENING     Status: None   Collection Time    08/02/14  5:59 AM      Result Value Ref Range Status   MRSA by PCR NEGATIVE  NEGATIVE Final   Comment:            The GeneXpert MRSA Assay (FDA     approved for NASAL specimens     only), is one component of a     comprehensive MRSA colonization     surveillance program. It is not     intended to diagnose MRSA     infection nor to guide or     monitor treatment for     MRSA infections.     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 Patient was admitted, GI was consulted, EGD and colonoscopy were performed which did not show any active bleeding. Subsequently a capsule endoscopy was  performed, results of which are pending at the time of discharge. Seen by gastroenterology today, cleared for discharged, they will followup patient anemia future and followup on the results of capsule endoscopy as well. Please note, stools are brown the time of discharge, hemoglobin continues to be stable. Patient was on Xarelto prior to this admission, which is currently on hold, and in the next few weeks- decision whether or not to restart it will need to be made depending on results of capsule endoscopy, and input from cardiology and gastroenterology.  Active Problems: Acute blood loss anemia - She was admitted and transfused 2 units of PRBC, hemoglobin has been stable since then. Please see above for further details. - History of chronic iron deficiency anemia, holding off on starting iron for now- please reassess whether or not to start iron at next visit.  Elevated troponin's - Likely secondary to demand ischemia, doubt non-STEMI at this point. - Seen by cardiology, will continue to manage medically-however given the severity of anemia and GI bleeding-will not place on antiplatelet agents at this time. Continue with beta blocker and statins.  Candida esophagitis - White plaques seen during EGD- will start Diflucan on discharge.  Atrial fibrillation - Currently in sinus rhythm. We'll resume amiodarone, diltiazem and beta blocker on discharge. Xarelto will be on hold. Patient and family aware of risk of stroke while off coagulation  History of CVA - Currently off anticoagulation. See above.  CAD, s/p CABG 2001 - See above    COPD Gold B - stable. Lungs clear. Continue with dry inhaler regimen.  Diabetes - Stable. Continue with oral hypoglycemic agents on discharge   TODAY-DAY OF DISCHARGE:  Subjective:   Danea Ruggirello today has no headache,no chest abdominal pain,no new weakness tingling or numbness, feels much better wants to go home today.   Objective:   Blood pressure  156/48, pulse 66, temperature 98.6 F (37 C), temperature source Oral, resp. rate 13, height 5\' 2"  (1.575 m), weight 72.167 kg (159 lb 1.6 oz), SpO2 94.00%.  Intake/Output Summary (Last 24 hours) at 08/07/14 1049 Last data filed at 08/06/14 2120  Gross per 24 hour  Intake    240 ml  Output      0 ml  Net    240 ml   Filed Weights   08/02/14 0236 08/06/14 0620 08/06/14 1531  Weight: 71.9 kg (158 lb 8.2 oz) 70.126 kg (154 lb 9.6 oz) 72.167 kg (159 lb 1.6 oz)    Exam Awake Alert, Oriented *3, No new F.N deficits, Normal affect [place].AT,PERRAL Supple Neck,No JVD, No cervical lymphadenopathy appriciated.  Symmetrical Chest wall movement, Good air movement bilaterally, CTAB RRR,No Gallops,Rubs or new Murmurs, No Parasternal Heave +ve B.Sounds, Abd Soft, Non tender, No organomegaly appriciated, No rebound -guarding or rigidity. No Cyanosis, Clubbing or edema, No new Rash or bruise  DISCHARGE CONDITION: Stable  DISPOSITION: Home  DISCHARGE INSTRUCTIONS:    Activity:  As tolerated   Instructions  Follow with Primary MD Nyoka Cowden, MD, Dr Fuller Plan, Dr Meda Coffee and Dr Lovena Le as instructed your Hospitalist MD  Please get a complete blood count and chemistry panel checked by your Primary MD at your next visit, and again as instructed by your Primary MD.  Continue to stay off Xarelto-resume ONLY after instructed by your Doctors  Please seek immediate medical attention if you have black tarry stools or frank blood in your stools.  You had Gastrointestinal Bleeding:  Please ask your Primary MD to check a complete blood count within one week of discharge or at your next visit.Your endoscopic/colonoscopic biopsies that are pending at the time of discharge, will also need to followed by your Primary MD.  Get Medicines reviewed and adjusted.  Please take all your medications with you for your next visit with your Primary MD  Please request your Primary MD to go over all hospital tests and  procedure/radiological results at the follow up, please ask your Primary MD to get all Hospital records sent to his/her office.  If you experience worsening of your admission symptoms, develop shortness of breath, life threatening emergency, suicidal or homicidal thoughts you must seek medical attention immediately by calling 911 or calling your MD immediately if symptoms less severe.  You must read complete instructions/literature along with all the possible adverse reactions/side effects for all the Medicines you take and that have been prescribed to you. Take any new Medicines after you have completely understood and accpet all the possible adverse reactions/side effects.  Do not drive when taking Pain medications.  Do not take more than prescribed Pain, Sleep and Anxiety Medications  Special Instructions: If you have smoked or chewed Tobacco in the last 2 yrs please stop smoking, stop any regular Alcohol and or any Recreational drug use.  Wear Seat belts while driving.  Please note  You were cared for by a hospitalist during your hospital stay. Once you are discharged, your primary care physician will handle any  further medical issues. Please note that NO REFILLS for any discharge medications will be authorized once you are discharged, as it is imperative that you return to your primary care physician (or establish a relationship with a primary care physician if you do not have one) for your aftercare needs so that they can reassess your need for medications and monitor your lab values.   Diet recommendation: Diabetic Diet Heart Healthy diet      Discharge Instructions   (HEART FAILURE PATIENTS) Call MD:  Anytime you have any of the following symptoms: 1) 3 pound weight gain in 24 hours or 5 pounds in 1 week 2) shortness of breath, with or without a dry hacking cough 3) swelling in the hands, feet or stomach 4) if you have to sleep on extra pillows at night in order to breathe.    Complete by:   As directed      Call MD for:    Complete by:  As directed   Black stools     Diet - low sodium heart healthy    Complete by:  As directed      Diet Carb Modified    Complete by:  As directed      Increase activity slowly    Complete by:  As directed            Follow-up Information   Follow up with Nyoka Cowden, MD. Schedule an appointment as soon as possible for a visit in 1 week.   Specialty:  Internal Medicine   Contact information:   [place] [address] [phone]       Follow up with Cristopher Peru, MD On 08/19/2014. (appt at 3:15 pm)    Specialty:  Cardiology   Contact information:   [address] [place] [address] [phone]       Follow up with Dorothy Spark, MD On 08/26/2014. (appt at 12 pm)    Specialty:  Cardiology   Contact information:   [address] [place] [address] [phone]       Follow up with Norberto Sorenson T. Fuller Plan, MD On 08/26/2014. (appt at 11 am)    Specialty:  Gastroenterology   Contact information:   [address] [place] [address] [phone]       Total Time spent on discharge equals 45 minutes.  SignedOren Binet 08/07/2014 10:49 AM  **Disclaimer: This note may have been dictated with voice recognition software. Similar sounding words can inadvertently be transcribed and this note may contain transcription errors which may not have been corrected upon publication of note.**

## 2014-08-07 NOTE — Discharge Instructions (Addendum)
Follow with Primary MD  Nyoka Cowden, MD, Dr Fuller Plan, Dr Meda Coffee and Dr Lovena Le as instructed your Hospitalist MD  Please get a complete blood count and chemistry panel checked by your Primary MD at your next visit, and again as instructed by your Primary MD.  Continue to stay off Xarelto-resume ONLY after instructed by your Doctors  Please seek immediate medical attention if you have black tarry stools or frank blood in your stools.  You had Gastrointestinal Bleeding: Please ask your Primary MD to check a complete blood count within one week of discharge or at your next visit.Your endoscopic/colonoscopic biopsies that are pending at the time of discharge, will also need to followed by your Primary MD.  Get Medicines reviewed and adjusted. Please take all your medications with you for your next visit with your Primary MD  Please request your Primary MD to go over all hospital tests and procedure/radiological results at the follow up, please ask your Primary MD to get all Hospital records sent to his/her office.  If you experience worsening of your admission symptoms, develop shortness of breath, life threatening emergency, suicidal or homicidal thoughts you must seek medical attention immediately by calling 911 or calling your MD immediately  if symptoms less severe.  You must read complete instructions/literature along with all the possible adverse reactions/side effects for all the Medicines you take and that have been prescribed to you. Take any new Medicines after you have completely understood and accpet all the possible adverse reactions/side effects.   Do not drive when taking Pain medications.   Do not take more than prescribed Pain, Sleep and Anxiety Medications  Special Instructions: If you have smoked or chewed Tobacco  in the last 2 yrs please stop smoking, stop any regular Alcohol  and or any Recreational drug use.  Wear Seat belts while driving.  Please note  You  were cared for by a hospitalist during your hospital stay. Once you are discharged, your primary care physician will handle any further medical issues. Please note that NO REFILLS for any discharge medications will be authorized once you are discharged, as it is imperative that you return to your primary care physician (or establish a relationship with a primary care physician if you do not have one) for your aftercare needs so that they can reassess your need for medications and monitor your lab values.

## 2014-08-07 NOTE — Evaluation (Signed)
Physical Therapy Evaluation Patient Details Name: [name] MRN: [id number] DOB: 12/10/1940 Today's Date: 08/07/2014   History of Present Illness  Patient is a 73 y/o female who has been experiencing frequent falls over the last 3-4 days and dark tarry stools and multiple episodes of loose stools. In ER, pt found to have a hemoglobin around 5 with stool for occult blood positive s/p blood transfusion. Admitted with acute GI bleed and anemia. S/p EGD 9/6-white, candidal looking plaques in esophagus. Awaiting fungal studies, s/p colonoscopy 9/7-signoid tics, small rectosigmoid polyp removed and s/p endoscopy.- awaiting results.   Clinical Impression  Patient presents with generalized weakness and balance deficits due to hospitalization limiting safe mobility. Balance improved with use of RW during gait training. Education performed on safe stair negotiation. Pt would benefit from skilled acute PT and follow up HHPT to improve gait, balance and overall mobility so pt can maximize independence and reduce risk of falls.    Follow Up Recommendations Home health PT;Supervision for mobility/OOB    Equipment Recommendations  None recommended by PT    Recommendations for Other Services       Precautions / Restrictions Precautions Precautions: Fall Restrictions Weight Bearing Restrictions: No      Mobility  Bed Mobility               General bed mobility comments: Received ambulating out of bathroom with RN upon PT arrival.   Transfers Overall transfer level: Needs assistance Equipment used: None;Rolling walker (2 wheeled) Transfers: Sit to/from Stand Sit to Stand: Min guard         General transfer comment: Stood from [employer], from chair x2. VC for hand placement. Min guard for safety.  Ambulation/Gait Ambulation/Gait assistance: Min guard Ambulation Distance (Feet): 150 Feet (+150' + 100' with multiple seated rest breaks.) Assistive device: Rolling walker (2  wheeled);None Gait Pattern/deviations: Step-through pattern;Decreased stance time - left;Decreased step length - right;Decreased stride length;Trunk flexed Gait velocity: 1.1 ft/sec Gait velocity interpretation: <1.8 ft/sec, indicative of risk for recurrent falls General Gait Details: Slow, unsteady gait reaching for furniture within room for stability. Balance improved with use of RW.  Ambulated ~100' without AD in hallway - cadence and gait speed decreased. Guarded. Dyspnea present during gait training with mutliple seated rest breaks needed. Not able to obtain Sa02 due to poor circulation and fingertips.  Stairs Stairs: Yes Stairs assistance: Min guard Stair Management: Alternating pattern;Two rails Number of Stairs: 3 General stair comments: VC for technique and sequencing however despite cues, pt performed alternating step pattern.  Wheelchair Mobility    Modified Rankin (Stroke Patients Only)       Balance Overall balance assessment: Needs assistance   Sitting balance-Leahy Scale: Good     Standing balance support: During functional activity Standing balance-Leahy Scale: Fair Standing balance comment: Able to perform dynamic standing without UE support for short periods however requires UE support for longer distances for safety, weakness.                             Pertinent Vitals/Pain Pain Assessment: No/denies pain    Home Living Family/patient expects to be discharged to:: Private residence Living Arrangements: Alone Available Help at Discharge: Family;Available PRN/intermittently Type of Home: House Home Access: Stairs to enter Entrance Stairs-Rails: Can reach both Entrance Stairs-Number of Steps: 3 Home Layout: One level Home Equipment: Walker - 2 wheels;Grab bars - tub/shower      Prior Function Level of  Independence: Independent               Hand Dominance   Dominant Hand: Right    Extremity/Trunk Assessment   Upper Extremity  Assessment: Overall WFL for tasks assessed           Lower Extremity Assessment: Generalized weakness         Communication   Communication: No difficulties  Cognition Arousal/Alertness: Awake/alert Behavior During Therapy: WFL for tasks assessed/performed Overall Cognitive Status: Within Functional Limits for tasks assessed                      General Comments      Exercises        Assessment/Plan    PT Assessment Patient needs continued PT services  PT Diagnosis Difficulty walking;Generalized weakness   PT Problem List Decreased strength;Cardiopulmonary status limiting activity;Decreased activity tolerance;Decreased knowledge of use of DME;Decreased safety awareness;Decreased balance;Decreased mobility;Decreased knowledge of precautions  PT Treatment Interventions DME instruction;Balance training;Gait training;Patient/family education;Functional mobility training;Therapeutic activities;Therapeutic exercise;Stair training;Cognitive remediation   PT Goals (Current goals can be found in the Care Plan section) Acute Rehab PT Goals Patient Stated Goal: to go home PT Goal Formulation: With patient Time For Goal Achievement: 08/21/14 Potential to Achieve Goals: Good    Frequency Min 3X/week   Barriers to discharge Decreased caregiver support Pt lives alone.    Co-evaluation               End of Session Equipment Utilized During Treatment: Gait belt Activity Tolerance: Patient limited by fatigue Patient left: in bed (sitting EOB.) Nurse Communication: Mobility status         Time: 6433-2951 PT Time Calculation (min): 32 min   Charges:   PT Evaluation $Initial PT Evaluation Tier I: 1 Procedure PT Treatments $Gait Training: 8-22 mins   PT G CodesCandy Sledge A 08/07/2014, 11:11 AM Candy Sledge, [place], DPT [phone]

## 2014-08-08 ENCOUNTER — Telehealth: Payer: Self-pay | Admitting: Critical Care Medicine

## 2014-08-08 ENCOUNTER — Ambulatory Visit: Payer: Medicare Other | Admitting: Family

## 2014-08-08 MED ORDER — TIOTROPIUM BROMIDE MONOHYDRATE 18 MCG IN CAPS: 18.0000 ug | ORAL_CAPSULE | Freq: Every day | RESPIRATORY_TRACT | Status: DC

## 2014-08-08 NOTE — Telephone Encounter (Signed)
Pt aware samples left for pick up in [facility] office

## 2014-08-09 ENCOUNTER — Telehealth: Payer: Self-pay | Admitting: Internal Medicine

## 2014-08-09 NOTE — Telephone Encounter (Signed)
Done by Butch Penny

## 2014-08-09 NOTE — Telephone Encounter (Signed)
Benjamine Mola a physical therapy  from [facility] called to ask for verbal orders to add occupational therapy to make sure she is save while showering.   Phone number [phone]

## 2014-08-09 NOTE — Telephone Encounter (Signed)
Left detailed message on Elizabeth's personal voicemail, verbal order given okay to add Occupational Therapy for pt.

## 2014-08-12 ENCOUNTER — Emergency Department (HOSPITAL_COMMUNITY): Payer: Medicare Other

## 2014-08-12 ENCOUNTER — Inpatient Hospital Stay (HOSPITAL_COMMUNITY)
Admission: EM | Admit: 2014-08-12 | Discharge: 2014-08-16 | DRG: 291 | Disposition: A | Discharge status: Home / Self Care | Payer: Medicare Other | Attending: Internal Medicine | Admitting: Internal Medicine

## 2014-08-12 ENCOUNTER — Encounter (HOSPITAL_COMMUNITY): Payer: Self-pay | Admitting: Emergency Medicine

## 2014-08-12 ENCOUNTER — Encounter: Payer: Self-pay | Admitting: Internal Medicine

## 2014-08-12 ENCOUNTER — Telehealth: Payer: Self-pay | Admitting: Internal Medicine

## 2014-08-12 ENCOUNTER — Ambulatory Visit (INDEPENDENT_AMBULATORY_CARE_PROVIDER_SITE_OTHER): Payer: Medicare Other | Admitting: Internal Medicine

## 2014-08-12 VITALS — BP 110/56 | HR 64 | Temp 97.3°F | Resp 22

## 2014-08-12 DIAGNOSIS — J449 Chronic obstructive pulmonary disease, unspecified: Secondary | ICD-10-CM

## 2014-08-12 DIAGNOSIS — J810 Acute pulmonary edema: Secondary | ICD-10-CM

## 2014-08-12 DIAGNOSIS — Z7982 Long term (current) use of aspirin: Secondary | ICD-10-CM

## 2014-08-12 DIAGNOSIS — D649 Anemia, unspecified: Secondary | ICD-10-CM

## 2014-08-12 DIAGNOSIS — I509 Heart failure, unspecified: Secondary | ICD-10-CM

## 2014-08-12 DIAGNOSIS — I359 Nonrheumatic aortic valve disorder, unspecified: Secondary | ICD-10-CM

## 2014-08-12 DIAGNOSIS — I4892 Unspecified atrial flutter: Secondary | ICD-10-CM | POA: Diagnosis present

## 2014-08-12 DIAGNOSIS — I2510 Atherosclerotic heart disease of native coronary artery without angina pectoris: Secondary | ICD-10-CM

## 2014-08-12 DIAGNOSIS — Z951 Presence of aortocoronary bypass graft: Secondary | ICD-10-CM

## 2014-08-12 DIAGNOSIS — E785 Hyperlipidemia, unspecified: Secondary | ICD-10-CM

## 2014-08-12 DIAGNOSIS — R778 Other specified abnormalities of plasma proteins: Secondary | ICD-10-CM

## 2014-08-12 DIAGNOSIS — Z79899 Other long term (current) drug therapy: Secondary | ICD-10-CM

## 2014-08-12 DIAGNOSIS — D62 Acute posthemorrhagic anemia: Secondary | ICD-10-CM

## 2014-08-12 DIAGNOSIS — E119 Type 2 diabetes mellitus without complications: Secondary | ICD-10-CM | POA: Diagnosis present

## 2014-08-12 DIAGNOSIS — J4489 Other specified chronic obstructive pulmonary disease: Secondary | ICD-10-CM | POA: Diagnosis present

## 2014-08-12 DIAGNOSIS — I248 Other forms of acute ischemic heart disease: Secondary | ICD-10-CM

## 2014-08-12 DIAGNOSIS — D500 Iron deficiency anemia secondary to blood loss (chronic): Secondary | ICD-10-CM | POA: Diagnosis present

## 2014-08-12 DIAGNOSIS — E8809 Other disorders of plasma-protein metabolism, not elsewhere classified: Secondary | ICD-10-CM | POA: Diagnosis present

## 2014-08-12 DIAGNOSIS — I350 Nonrheumatic aortic (valve) stenosis: Secondary | ICD-10-CM

## 2014-08-12 DIAGNOSIS — R945 Abnormal results of liver function studies: Secondary | ICD-10-CM

## 2014-08-12 DIAGNOSIS — I5033 Acute on chronic diastolic (congestive) heart failure: Secondary | ICD-10-CM | POA: Diagnosis not present

## 2014-08-12 DIAGNOSIS — F329 Major depressive disorder, single episode, unspecified: Secondary | ICD-10-CM | POA: Diagnosis present

## 2014-08-12 DIAGNOSIS — K7200 Acute and subacute hepatic failure without coma: Secondary | ICD-10-CM | POA: Diagnosis present

## 2014-08-12 DIAGNOSIS — M48061 Spinal stenosis, lumbar region without neurogenic claudication: Secondary | ICD-10-CM | POA: Diagnosis present

## 2014-08-12 DIAGNOSIS — R0602 Shortness of breath: Secondary | ICD-10-CM | POA: Diagnosis not present

## 2014-08-12 DIAGNOSIS — R7989 Other specified abnormal findings of blood chemistry: Secondary | ICD-10-CM

## 2014-08-12 DIAGNOSIS — I679 Cerebrovascular disease, unspecified: Secondary | ICD-10-CM

## 2014-08-12 DIAGNOSIS — I639 Cerebral infarction, unspecified: Secondary | ICD-10-CM

## 2014-08-12 DIAGNOSIS — I6340 Cerebral infarction due to embolism of unspecified cerebral artery: Secondary | ICD-10-CM

## 2014-08-12 DIAGNOSIS — I4891 Unspecified atrial fibrillation: Secondary | ICD-10-CM | POA: Diagnosis present

## 2014-08-12 DIAGNOSIS — E1165 Type 2 diabetes mellitus with hyperglycemia: Secondary | ICD-10-CM

## 2014-08-12 DIAGNOSIS — K922 Gastrointestinal hemorrhage, unspecified: Secondary | ICD-10-CM

## 2014-08-12 DIAGNOSIS — Z87891 Personal history of nicotine dependence: Secondary | ICD-10-CM

## 2014-08-12 DIAGNOSIS — M1990 Unspecified osteoarthritis, unspecified site: Secondary | ICD-10-CM

## 2014-08-12 DIAGNOSIS — Z7901 Long term (current) use of anticoagulants: Secondary | ICD-10-CM

## 2014-08-12 DIAGNOSIS — K219 Gastro-esophageal reflux disease without esophagitis: Secondary | ICD-10-CM | POA: Diagnosis present

## 2014-08-12 DIAGNOSIS — I5021 Acute systolic (congestive) heart failure: Secondary | ICD-10-CM

## 2014-08-12 DIAGNOSIS — I25810 Atherosclerosis of coronary artery bypass graft(s) without angina pectoris: Secondary | ICD-10-CM

## 2014-08-12 DIAGNOSIS — R0902 Hypoxemia: Secondary | ICD-10-CM

## 2014-08-12 DIAGNOSIS — I10 Essential (primary) hypertension: Secondary | ICD-10-CM

## 2014-08-12 DIAGNOSIS — I739 Peripheral vascular disease, unspecified: Secondary | ICD-10-CM

## 2014-08-12 DIAGNOSIS — I471 Supraventricular tachycardia: Secondary | ICD-10-CM

## 2014-08-12 DIAGNOSIS — R7981 Abnormal blood-gas level: Secondary | ICD-10-CM | POA: Diagnosis present

## 2014-08-12 DIAGNOSIS — I2489 Other forms of acute ischemic heart disease: Secondary | ICD-10-CM | POA: Diagnosis present

## 2014-08-12 DIAGNOSIS — F3289 Other specified depressive episodes: Secondary | ICD-10-CM | POA: Diagnosis present

## 2014-08-12 LAB — COMPREHENSIVE METABOLIC PANEL
ALBUMIN: 2.9 g/dL — AB (ref 3.5–5.2)
ALK PHOS: 141 U/L — AB (ref 39–117)
ALT: 156 U/L — ABNORMAL HIGH (ref 0–35)
ANION GAP: 20 — AB (ref 5–15)
AST: 107 U/L — ABNORMAL HIGH (ref 0–37)
BUN: 24 mg/dL — ABNORMAL HIGH (ref 6–23)
CHLORIDE: 98 meq/L (ref 96–112)
CO2: 19 mEq/L (ref 19–32)
Calcium: 9.7 mg/dL (ref 8.4–10.5)
Creatinine, Ser: 1.23 mg/dL — ABNORMAL HIGH (ref 0.50–1.10)
GFR calc Af Amer: 50 mL/min — ABNORMAL LOW (ref >90)
GFR calc non Af Amer: 43 mL/min — ABNORMAL LOW (ref >90)
Glucose, Bld: 126 mg/dL — ABNORMAL HIGH (ref 70–99)
Potassium: 4.6 mEq/L (ref 3.7–5.3)
SODIUM: 137 meq/L (ref 137–147)
TOTAL PROTEIN: 7.5 g/dL (ref 6.0–8.3)
Total Bilirubin: 0.9 mg/dL (ref 0.3–1.2)

## 2014-08-12 LAB — TROPONIN I: Troponin I: 0.55 ng/mL (ref <0.30)

## 2014-08-12 LAB — URINALYSIS, ROUTINE W REFLEX MICROSCOPIC
BILIRUBIN URINE: NEGATIVE
Glucose, UA: NEGATIVE mg/dL
HGB URINE DIPSTICK: NEGATIVE
Ketones, ur: NEGATIVE mg/dL
NITRITE: NEGATIVE
PROTEIN: NEGATIVE mg/dL
SPECIFIC GRAVITY, URINE: 1.011 (ref 1.005–1.030)
UROBILINOGEN UA: 1 mg/dL (ref 0.0–1.0)
pH: 5.5 (ref 5.0–8.0)

## 2014-08-12 LAB — CBC
HCT: 30.2 % — ABNORMAL LOW (ref 36.0–46.0)
Hemoglobin: 9.2 g/dL — ABNORMAL LOW (ref 12.0–15.0)
MCH: 24.9 pg — AB (ref 26.0–34.0)
MCHC: 30.5 g/dL (ref 30.0–36.0)
MCV: 81.6 fL (ref 78.0–100.0)
Platelets: 128 10*3/uL — ABNORMAL LOW (ref 150–400)
RBC: 3.7 MIL/uL — ABNORMAL LOW (ref 3.87–5.11)
RDW: 18 % — AB (ref 11.5–15.5)
WBC: 9.9 10*3/uL (ref 4.0–10.5)

## 2014-08-12 LAB — URINE MICROSCOPIC-ADD ON

## 2014-08-12 LAB — PRO B NATRIURETIC PEPTIDE: PRO B NATRI PEPTIDE: 8774 pg/mL — AB (ref 0–125)

## 2014-08-12 LAB — PROTIME-INR
INR: 1.31 (ref 0.00–1.49)
Prothrombin Time: 16.3 seconds — ABNORMAL HIGH (ref 11.6–15.2)

## 2014-08-12 LAB — POC OCCULT BLOOD, ED: Fecal Occult Bld: NEGATIVE

## 2014-08-12 LAB — LACTIC ACID, PLASMA: LACTIC ACID, VENOUS: 3.4 mmol/L — AB (ref 0.5–2.2)

## 2014-08-12 MED ORDER — SERTRALINE HCL 25 MG PO TABS
25.0000 mg | ORAL_TABLET | Freq: Every day | ORAL | Status: DC
  Administered 2014-08-13 – 2014-08-16 (×4): 25 mg via ORAL
  Filled 2014-08-12 (×4): qty 1

## 2014-08-12 MED ORDER — TIOTROPIUM BROMIDE MONOHYDRATE 18 MCG IN CAPS
18.0000 ug | ORAL_CAPSULE | Freq: Every day | RESPIRATORY_TRACT | Status: DC
  Administered 2014-08-13 – 2014-08-16 (×4): 18 ug via RESPIRATORY_TRACT
  Filled 2014-08-12: qty 5

## 2014-08-12 MED ORDER — SODIUM CHLORIDE 0.9 % IJ SOLN
3.0000 mL | Freq: Two times a day (BID) | INTRAMUSCULAR | Status: DC
  Administered 2014-08-13 – 2014-08-15 (×5): 3 mL via INTRAVENOUS

## 2014-08-12 MED ORDER — FLUCONAZOLE 100 MG PO TABS: 100.0000 mg | ORAL_TABLET | Freq: Every day | ORAL | Status: DC

## 2014-08-12 MED ORDER — FUROSEMIDE 40 MG PO TABS
40.0000 mg | ORAL_TABLET | Freq: Every day | ORAL | Status: DC
  Filled 2014-08-12: qty 1

## 2014-08-12 MED ORDER — SODIUM CHLORIDE 0.9 % IJ SOLN: 3.0000 mL | INTRAMUSCULAR | Status: DC | PRN

## 2014-08-12 MED ORDER — ACETAMINOPHEN 325 MG PO TABS: 650.0000 mg | ORAL_TABLET | ORAL | Status: DC | PRN

## 2014-08-12 MED ORDER — ALBUTEROL SULFATE (2.5 MG/3ML) 0.083% IN NEBU: 3.0000 mL | INHALATION_SOLUTION | RESPIRATORY_TRACT | Status: DC | PRN

## 2014-08-12 MED ORDER — ASPIRIN EC 81 MG PO TBEC
81.0000 mg | DELAYED_RELEASE_TABLET | Freq: Every day | ORAL | Status: DC
  Administered 2014-08-13 – 2014-08-16 (×4): 81 mg via ORAL
  Filled 2014-08-12 (×4): qty 1

## 2014-08-12 MED ORDER — GLIMEPIRIDE 1 MG PO TABS
1.0000 mg | ORAL_TABLET | Freq: Every day | ORAL | Status: DC
  Administered 2014-08-13 – 2014-08-14 (×2): 1 mg via ORAL
  Filled 2014-08-12 (×4): qty 1

## 2014-08-12 MED ORDER — POTASSIUM CHLORIDE ER 10 MEQ PO TBCR
10.0000 meq | EXTENDED_RELEASE_TABLET | Freq: Every day | ORAL | Status: DC
  Administered 2014-08-13 – 2014-08-16 (×4): 10 meq via ORAL
  Filled 2014-08-12 (×4): qty 1

## 2014-08-12 MED ORDER — METOPROLOL TARTRATE 25 MG PO TABS
25.0000 mg | ORAL_TABLET | Freq: Two times a day (BID) | ORAL | Status: DC
  Administered 2014-08-13 – 2014-08-16 (×8): 25 mg via ORAL
  Filled 2014-08-12 (×9): qty 1

## 2014-08-12 MED ORDER — AMIODARONE HCL 200 MG PO TABS
200.0000 mg | ORAL_TABLET | Freq: Every day | ORAL | Status: DC
  Administered 2014-08-13 – 2014-08-16 (×4): 200 mg via ORAL
  Filled 2014-08-12 (×4): qty 1

## 2014-08-12 MED ORDER — GABAPENTIN 600 MG PO TABS
600.0000 mg | ORAL_TABLET | Freq: Three times a day (TID) | ORAL | Status: DC
  Administered 2014-08-13 – 2014-08-15 (×8): 600 mg via ORAL
  Filled 2014-08-12 (×10): qty 1

## 2014-08-12 MED ORDER — INSULIN ASPART 100 UNIT/ML ~~LOC~~ SOLN
0.0000 [IU] | Freq: Three times a day (TID) | SUBCUTANEOUS | Status: DC
  Administered 2014-08-13: 2 [IU] via SUBCUTANEOUS

## 2014-08-12 MED ORDER — SODIUM CHLORIDE 0.9 % IV SOLN: 250.0000 mL | INTRAVENOUS | Status: DC | PRN

## 2014-08-12 MED ORDER — DILTIAZEM HCL ER COATED BEADS 120 MG PO CP24
120.0000 mg | ORAL_CAPSULE | Freq: Every day | ORAL | Status: DC
  Administered 2014-08-13 – 2014-08-16 (×4): 120 mg via ORAL
  Filled 2014-08-12 (×4): qty 1

## 2014-08-12 MED ORDER — CILOSTAZOL 100 MG PO TABS
100.0000 mg | ORAL_TABLET | Freq: Two times a day (BID) | ORAL | Status: DC
  Administered 2014-08-13 – 2014-08-14 (×4): 100 mg via ORAL
  Filled 2014-08-12 (×5): qty 1

## 2014-08-12 MED ORDER — NITROGLYCERIN 0.4 MG SL SUBL: 0.4000 mg | SUBLINGUAL_TABLET | SUBLINGUAL | Status: DC | PRN

## 2014-08-12 MED ORDER — ONDANSETRON HCL 4 MG/2ML IJ SOLN: 4.0000 mg | Freq: Four times a day (QID) | INTRAMUSCULAR | Status: DC | PRN

## 2014-08-12 MED ORDER — FUROSEMIDE 10 MG/ML IJ SOLN
40.0000 mg | Freq: Once | INTRAMUSCULAR | Status: AC
  Administered 2014-08-12: 40 mg via INTRAVENOUS
  Filled 2014-08-12: qty 4

## 2014-08-12 MED ORDER — LORAZEPAM 0.5 MG PO TABS: 0.5000 mg | ORAL_TABLET | Freq: Two times a day (BID) | ORAL | Status: DC | PRN

## 2014-08-12 MED ORDER — HYDROCODONE-ACETAMINOPHEN 5-325 MG PO TABS: 0.5000 | ORAL_TABLET | Freq: Four times a day (QID) | ORAL | Status: DC | PRN

## 2014-08-12 MED ORDER — ENOXAPARIN SODIUM 30 MG/0.3ML ~~LOC~~ SOLN
30.0000 mg | SUBCUTANEOUS | Status: DC
  Administered 2014-08-13 – 2014-08-15 (×3): 30 mg via SUBCUTANEOUS
  Filled 2014-08-12 (×4): qty 0.3

## 2014-08-12 NOTE — Progress Notes (Signed)
Subjective:    Patient ID: [name], female    DOB: 23-Apr-1941, 74 y.o.   MRN: [id number]  HPI  73 year old patient who is seen today following a recent hospital discharge 5 days ago.  She presented with a history of melena in the setting of NSAID use and was treated for severe anemia secondary to upper GI bleeding.  She received 2 units of packed RBCs and was discharged on a reduced dose of furosemide 20 mg daily. Since her hospital discharge, she has remained quite weak, but her chief complaint is severe dyspnea on exertion.  Denies any orthopnea, or PND but quite short of breath with ambulation for bathroom use. Hospital records reviewed Most recent chest x-ray revealed marked interstitial changes. The patient does have a history of COPD and has been on Spiriva.  She has a prescription for Symbicort and albuterol, but has been using both as rescue medications only.  Denies any wheezing.  Office evaluation revealed an O2 saturation of 80-85%  Past Medical History  Diagnosis Date  . ANEMIA 08/28/2010  . CAD (coronary artery disease)     a. s/p CABGx4 in 2001.  Marland Kitchen COLONIC POLYPS, HX OF 06/15/2007  . COPD (chronic obstructive pulmonary disease)   . Depressive disorder   . GERD 12/19/2009  . Hyperlipidemia   . Hypertension   . HYPOTENSION 08/11/2010  . Osteoarthritis     Lumbar stenosis  . PVD (peripheral vascular disease)     a. Duplex 03/2013: stable moderate carotid dz - 70-35% RICA, 00-93% LICA, L subclavian artery to CCA stent widely patent.  b. Prior R fempop bypass graft, R CIA stent.  . Lung abscess 2011  . CHF (congestive heart failure)   . Hx of colonoscopy   . Paroxysmal atrial flutter     a. Dx 01/2014, placed on Xarelto.  . Aortic stenosis     a. Mild by echo 01/2013.  . Diabetes mellitus without complication     History   Social History  . Marital Status: Widowed    Spouse Name: N/A    Number of Children: 2  . Years of Education: N/A   Occupational  History  . Receptionist    Social History Main Topics  . Smoking status: Former Smoker -- 1.50 packs/day for 40 years    Types: Cigarettes    Quit date: 06/29/2000  . Smokeless tobacco: Never Used  . Alcohol Use: No  . Drug Use: No  . Sexual Activity: Not on file   Other Topics Concern  . Not on file   Social History Narrative  . No narrative on file    Past Surgical History  Procedure Laterality Date  . L subclavian bypass  07/2004  . Tubal ligation    . Coronary artery bypass graft  05/2000    LIMA-LAD, SVG-OM, SVG-PDA-PL  . Femoral popliteal bypass-r  04/1999  . Right common iliac pta with stent placement  07/2000  . Right leg blockage  2003  . Aorta bifemoral bypass graft  2004  . Left cea  2005  . Lung cyst biopsy  2011  . Esophagogastroduodenoscopy N/A 08/03/2014    Procedure: ESOPHAGOGASTRODUODENOSCOPY (EGD);  Surgeon: Juanita Craver, MD;  Location: [facility] ENDOSCOPY;  Service: Endoscopy;  Laterality: N/A;  . Colonoscopy Left 08/05/2014    Procedure: COLONOSCOPY;  Surgeon: Juanita Craver, MD;  Location: [place];  Service: Endoscopy;  Laterality: Left;  . Givens capsule study N/A 08/06/2014    Procedure: GIVENS CAPSULE STUDY;  Surgeon: Irene Shipper, MD;  Location: [facility] ENDOSCOPY;  Service: Endoscopy;  Laterality: N/A;    Family History  Problem Relation Age of Onset  . Diabetes Father   . Heart attack Mother   . Diabetes    . Coronary artery disease    . Colon cancer Neg Hx     Allergies  Allergen Reactions  . Codeine Phosphate     REACTION: unspecified    Current Outpatient Prescriptions on File Prior to Visit  Medication Sig Dispense Refill  . amiodarone (PACERONE) 200 MG tablet Take 1 tablet (200 mg total) by mouth daily.  90 tablet  6  . atorvastatin (LIPITOR) 40 MG tablet Take 1 tablet (40 mg total) by mouth every evening.  90 tablet  1  . cilostazol (PLETAL) 100 MG tablet Take 100 mg by mouth 2 (two) times daily.      Marland Kitchen diltiazem (CARDIZEM CD) 120 MG 24 hr capsule  Take 1 capsule by mouth daily.      . fluconazole (DIFLUCAN) 100 MG tablet Take 1 tablet (100 mg total) by mouth daily.  10 tablet  0  . furosemide (LASIX) 40 MG tablet Take 0.5 tablets (20 mg total) by mouth daily.  90 tablet  6  . gabapentin (NEURONTIN) 600 MG tablet Take 1 tablet (600 mg total) by mouth 3 (three) times daily.  270 tablet  3  . glimepiride (AMARYL) 1 MG tablet Take 1 mg by mouth daily with breakfast.      . HYDROcodone-acetaminophen (NORCO/VICODIN) 5-325 MG per tablet Take 0.5 tablets by mouth every 6 (six) hours as needed for moderate pain.  90 tablet  0  . LORazepam (ATIVAN) 0.5 MG tablet Take 0.5 mg by mouth 2 (two) times daily.      . metFORMIN (GLUCOPHAGE) 500 MG tablet Take 1 tablet (500 mg total) by mouth 2 (two) times daily with a meal.  180 tablet  3  . metoprolol (LOPRESSOR) 50 MG tablet Take 25 mg by mouth 2 (two) times daily.       . Multiple Vitamins-Minerals (CENTRUM SILVER ULTRA WOMENS PO) Take 1 capsule by mouth daily.       . nitroGLYCERIN (NITROSTAT) 0.4 MG SL tablet Place 0.4 mg under the tongue every 5 (five) minutes as needed for chest pain.      . potassium chloride (K-DUR) 10 MEQ tablet Take 1 tablet (10 mEq total) by mouth daily.  30 tablet  6  . promethazine (PHENERGAN) 25 MG tablet Take 1 tablet (25 mg total) by mouth every 6 (six) hours as needed for nausea or vomiting.  20 tablet  2  . sertraline (ZOLOFT) 50 MG tablet Take 25 mg by mouth daily.       Marland Kitchen tiotropium (SPIRIVA) 18 MCG inhalation capsule Place 1 capsule (18 mcg total) into inhaler and inhale daily.  20 capsule  0   No current facility-administered medications on file prior to visit.    BP 110/56  Pulse 64  Temp(Src) 97.3 F (36.3 C) (Oral)  Resp 22  SpO2 81%     Review of Systems  Constitutional: Positive for activity change and fatigue.  HENT: Negative for congestion, dental problem, hearing loss, rhinorrhea, sinus pressure, sore throat and tinnitus.   Eyes: Negative for pain,  discharge and visual disturbance.  Respiratory: Positive for shortness of breath. Negative for cough and wheezing.   Cardiovascular: Negative for chest pain, palpitations and leg swelling.  Gastrointestinal: Negative for nausea, vomiting, abdominal pain, diarrhea,  constipation, blood in stool and abdominal distention.  Genitourinary: Negative for dysuria, urgency, frequency, hematuria, flank pain, vaginal bleeding, vaginal discharge, difficulty urinating, vaginal pain and pelvic pain.  Musculoskeletal: Negative for arthralgias, gait problem and joint swelling.  Skin: Negative for rash.  Neurological: Positive for weakness. Negative for dizziness, syncope, speech difficulty, numbness and headaches.  Hematological: Negative for adenopathy.  Psychiatric/Behavioral: Negative for behavioral problems, dysphoric mood and agitation. The patient is not nervous/anxious.        Objective:   Physical Exam  Constitutional: She is oriented to person, place, and time. She appears well-developed and well-nourished.  Extremely weak Sitting and wheelchair Blood pressure 100/60 left arm O2 saturation 80-85%   HENT:  Head: Normocephalic.  Right Ear: External ear normal.  Left Ear: External ear normal.  Mouth/Throat: Oropharynx is clear and moist.  Eyes: Conjunctivae and EOM are normal. Pupils are equal, round, and reactive to light.  Neck: Normal range of motion. Neck supple. No thyromegaly present.  Diffuse bruits in carotid and supraclavicular distributions  Cardiovascular: Normal rate, regular rhythm and intact distal pulses.   Murmur heard. Grade 2 over 6 systolic murmur  Pulmonary/Chest: Effort normal. She has rales.  Diffuse rales involving lower 1/2 lung fields  Abdominal: Soft. Bowel sounds are normal. She exhibits no mass. There is no tenderness.  Musculoskeletal: Normal range of motion. She exhibits no edema.  Lymphadenopathy:    She has no cervical adenopathy.  Neurological: She is alert  and oriented to person, place, and time.  Skin: Skin is warm and dry. No rash noted.  Psychiatric: She has a normal mood and affect. Her behavior is normal.          Assessment & Plan:   Hypoxic respiratory failure.  Multi-factorial with COPD and fluid overload primary factors Anemia History of recent GI bleed COPD  The patient was advised to report to [facility] ED immediately for stabilization and further inpatient management.  The patient is accompanied by her daughter and both agreed to report to the ED stat

## 2014-08-12 NOTE — Progress Notes (Signed)
Pre visit review using our clinic review tool, if applicable. No additional management support is needed unless otherwise documented below in the visit note. 

## 2014-08-12 NOTE — ED Notes (Signed)
Patient transported to X-ray 

## 2014-08-12 NOTE — ED Notes (Signed)
Pt was seen at PCP today, oxygen 80% RA. Was admitted last week for anemia. Since discharge has been SOB. Denies pain, reports generalized weakness. Is 88 % RA.

## 2014-08-12 NOTE — Telephone Encounter (Signed)
Benjamine Mola is physical therapist w/ [facility]. Saw pt on Friday and feels like pt does not look good . Not as good as she had.  O2 in 80's.  No energy, weak, sob. Multiple episodes of diarrhea. Not bloody, (pt was in hosp for gi bleeds) hopes to have hemoglobin checked today. Does the dr want her to have home health nurse come out since pt not doing well, if so she can add w/ verbal ok order. Benjamine Mola wanted you to know prior to pt's hos fup today at 4pm

## 2014-08-12 NOTE — Telephone Encounter (Signed)
Please see message and advise 

## 2014-08-12 NOTE — ED Notes (Signed)
Notified Dr. Aline Brochure pt's O2 sats are 89-90, placed pt on 3L [place]

## 2014-08-12 NOTE — ED Provider Notes (Signed)
CSN: [id number]     Arrival date & time 08/12/14  1713 History   First MD Initiated Contact with Patient 08/12/14 1807     Chief Complaint  Patient presents with  . Weakness     (Consider location/radiation/quality/duration/timing/severity/associated sxs/prior Treatment) Patient is a 73 y.o. female presenting with shortness of breath. The history is provided by the patient.  Shortness of Breath Severity:  Mild Onset quality:  Gradual Duration:  5 days Timing:  Constant Progression:  Worsening Chronicity:  New Context comment:  At rest Relieved by:  Nothing Worsened by:  Activity Ineffective treatments:  None tried Associated symptoms: no abdominal pain, no chest pain, no cough, no fever, no headaches, no neck pain and no vomiting     Past Medical History  Diagnosis Date  . ANEMIA 08/28/2010  . CAD (coronary artery disease)     a. s/p CABGx4 in 2001.  Marland Kitchen COLONIC POLYPS, HX OF 06/15/2007  . COPD (chronic obstructive pulmonary disease)   . Depressive disorder   . GERD 12/19/2009  . Hyperlipidemia   . Hypertension   . HYPOTENSION 08/11/2010  . Osteoarthritis     Lumbar stenosis  . PVD (peripheral vascular disease)     a. Duplex 03/2013: stable moderate carotid dz - 08-65% RICA, 78-46% LICA, L subclavian artery to CCA stent widely patent.  b. Prior R fempop bypass graft, R CIA stent.  . Lung abscess 2011  . CHF (congestive heart failure)   . Hx of colonoscopy   . Paroxysmal atrial flutter     a. Dx 01/2014, placed on Xarelto.  . Aortic stenosis     a. Mild by echo 01/2013.  . Diabetes mellitus without complication    Past Surgical History  Procedure Laterality Date  . L subclavian bypass  07/2004  . Tubal ligation    . Coronary artery bypass graft  05/2000    LIMA-LAD, SVG-OM, SVG-PDA-PL  . Femoral popliteal bypass-r  04/1999  . Right common iliac pta with stent placement  07/2000  . Right leg blockage  2003  . Aorta bifemoral bypass graft  2004  . Left cea  2005  . Lung  cyst biopsy  2011  . Esophagogastroduodenoscopy N/A 08/03/2014    Procedure: ESOPHAGOGASTRODUODENOSCOPY (EGD);  Surgeon: Juanita Craver, MD;  Location: [facility] ENDOSCOPY;  Service: Endoscopy;  Laterality: N/A;  . Colonoscopy Left 08/05/2014    Procedure: COLONOSCOPY;  Surgeon: Juanita Craver, MD;  Location: [place];  Service: Endoscopy;  Laterality: Left;  Freda Munro capsule study N/A 08/06/2014    Procedure: GIVENS CAPSULE STUDY;  Surgeon: Irene Shipper, MD;  Location: [place];  Service: Endoscopy;  Laterality: N/A;   Family History  Problem Relation Age of Onset  . Diabetes Father   . Heart attack Mother   . Diabetes    . Coronary artery disease    . Colon cancer Neg Hx    History  Substance Use Topics  . Smoking status: Former Smoker -- 1.50 packs/day for 40 years    Types: Cigarettes    Quit date: 06/29/2000  . Smokeless tobacco: Never Used  . Alcohol Use: No   OB History   Grav Para Term Preterm Abortions TAB SAB Ect Mult Living                 Review of Systems  Constitutional: Negative for fever and fatigue.  HENT: Negative for congestion and drooling.   Eyes: Negative for pain.  Respiratory: Positive for shortness of breath. Negative  for cough.   Cardiovascular: Negative for chest pain.  Gastrointestinal: Negative for nausea, vomiting, abdominal pain and diarrhea.  Genitourinary: Negative for dysuria and hematuria.  Musculoskeletal: Negative for back pain, gait problem and neck pain.  Skin: Negative for color change.  Neurological: Negative for dizziness and headaches.  Hematological: Negative for adenopathy.  Psychiatric/Behavioral: Negative for behavioral problems.  All other systems reviewed and are negative.     Allergies  Codeine phosphate  Home Medications   Prior to Admission medications   Medication Sig Start Date End Date Taking? Authorizing Provider  albuterol (PROVENTIL HFA;VENTOLIN HFA) 108 (90 BASE) MCG/ACT inhaler Inhale 2 puffs into the lungs every 4  (four) hours as needed for wheezing or shortness of breath.    Historical Provider, MD  amiodarone (PACERONE) 200 MG tablet Take 1 tablet (200 mg total) by mouth daily. 07/03/14   Dorothy Spark, MD  atorvastatin (LIPITOR) 40 MG tablet Take 1 tablet (40 mg total) by mouth every evening. 05/23/14   Dorothy Spark, MD  cilostazol (PLETAL) 100 MG tablet Take 100 mg by mouth 2 (two) times daily.    Historical Provider, MD  diltiazem (CARDIZEM CD) 120 MG 24 hr capsule Take 1 capsule by mouth daily. 03/18/14   Historical Provider, MD  fluconazole (DIFLUCAN) 100 MG tablet Take 1 tablet (100 mg total) by mouth daily. 08/07/14   Shanker Kristeen Mans, MD  furosemide (LASIX) 40 MG tablet Take 0.5 tablets (20 mg total) by mouth daily. 04/13/14   Sheila Oats, MD  gabapentin (NEURONTIN) 600 MG tablet Take 1 tablet (600 mg total) by mouth 3 (three) times daily. 07/23/13   Marletta Lor, MD  glimepiride (AMARYL) 1 MG tablet Take 1 mg by mouth daily with breakfast.    Historical Provider, MD  HYDROcodone-acetaminophen (NORCO/VICODIN) 5-325 MG per tablet Take 0.5 tablets by mouth every 6 (six) hours as needed for moderate pain. 03/25/14   Marletta Lor, MD  LORazepam (ATIVAN) 0.5 MG tablet Take 0.5 mg by mouth 2 (two) times daily.    Historical Provider, MD  metFORMIN (GLUCOPHAGE) 500 MG tablet Take 1 tablet (500 mg total) by mouth 2 (two) times daily with a meal. 04/26/14   Marletta Lor, MD  metoprolol (LOPRESSOR) 50 MG tablet Take 25 mg by mouth 2 (two) times daily.  04/13/14   Sheila Oats, MD  Multiple Vitamins-Minerals (CENTRUM SILVER ULTRA WOMENS PO) Take 1 capsule by mouth daily.     Historical Provider, MD  nitroGLYCERIN (NITROSTAT) 0.4 MG SL tablet Place 0.4 mg under the tongue every 5 (five) minutes as needed for chest pain.    Historical Provider, MD  potassium chloride (K-DUR) 10 MEQ tablet Take 1 tablet (10 mEq total) by mouth daily. 03/12/14   Peter M Martinique, MD  promethazine  (PHENERGAN) 25 MG tablet Take 1 tablet (25 mg total) by mouth every 6 (six) hours as needed for nausea or vomiting. 03/25/14   Marletta Lor, MD  sertraline (ZOLOFT) 50 MG tablet Take 25 mg by mouth daily.     Historical Provider, MD  tiotropium (SPIRIVA) 18 MCG inhalation capsule Place 1 capsule (18 mcg total) into inhaler and inhale daily. 08/08/14   Elsie Stain, MD   BP 127/58  Pulse 68  Temp(Src) 97.6 F (36.4 C) (Oral)  Resp 17  SpO2 90% Physical Exam  Nursing note and vitals reviewed. Constitutional: She is oriented to person, place, and time. She appears well-developed and well-nourished.  HENT:  Head: Normocephalic and atraumatic.  Mouth/Throat: Oropharynx is clear and moist. No oropharyngeal exudate.  Eyes: Conjunctivae and EOM are normal. Pupils are equal, round, and reactive to light.  Neck: Normal range of motion. Neck supple.  Cardiovascular: Normal rate, regular rhythm and intact distal pulses.  Exam reveals no gallop and no friction rub.   Murmur heard. Pulmonary/Chest: Effort normal. No respiratory distress. She has no wheezes.  Mild crackles at lung bases   Abdominal: Soft. Bowel sounds are normal. There is no tenderness. There is no rebound and no guarding.  Genitourinary:  Non-thrombosed external hemorrhoids. Brownish appearing mucus.  Musculoskeletal: Normal range of motion. She exhibits edema (mild edema in bilateral LE's). She exhibits no tenderness.  Neurological: She is alert and oriented to person, place, and time.  Skin: Skin is warm and dry.  Psychiatric: She has a normal mood and affect. Her behavior is normal.    ED Course  Procedures (including critical care time) Labs Review Labs Reviewed  CBC - Abnormal; Notable for the following:    RBC 3.70 (*)    Hemoglobin 9.2 (*)    HCT 30.2 (*)    MCH 24.9 (*)    RDW 18.0 (*)    Platelets 128 (*)    All other components within normal limits  COMPREHENSIVE METABOLIC PANEL - Abnormal; Notable  for the following:    Glucose, Bld 126 (*)    BUN 24 (*)    Creatinine, Ser 1.23 (*)    Albumin 2.9 (*)    AST 107 (*)    ALT 156 (*)    Alkaline Phosphatase 141 (*)    GFR calc non Af Amer 43 (*)    GFR calc Af Amer 50 (*)    Anion gap 20 (*)    All other components within normal limits  TROPONIN I - Abnormal; Notable for the following:    Troponin I 0.55 (*)    All other components within normal limits  PROTIME-INR - Abnormal; Notable for the following:    Prothrombin Time 16.3 (*)    All other components within normal limits  PRO B NATRIURETIC PEPTIDE - Abnormal; Notable for the following:    Pro B Natriuretic peptide (BNP) 8774.0 (*)    All other components within normal limits  LACTIC ACID, PLASMA - Abnormal; Notable for the following:    Lactic Acid, Venous 3.4 (*)    All other components within normal limits  URINALYSIS, ROUTINE W REFLEX MICROSCOPIC - Abnormal; Notable for the following:    APPearance CLOUDY (*)    Leukocytes, UA TRACE (*)    All other components within normal limits  URINE MICROSCOPIC-ADD ON - Abnormal; Notable for the following:    Squamous Epithelial / LPF MANY (*)    Bacteria, UA MANY (*)    Casts HYALINE CASTS (*)    All other components within normal limits  POC OCCULT BLOOD, ED    Imaging Review Dg Chest 2 View  08/12/2014   CLINICAL DATA:  Recently discharged. Anemic and shortness of breath. Low oxygen levels. High blood pressure. History of diabetes, aortic stenosis, anemia, CAD, COPD, GERD, PVD, CHF.  EXAM: CHEST  2 VIEW  COMPARISON:  08/02/2014 and multiple prior studies  FINDINGS: Patient has had median sternotomy and CABG. The heart is enlarged. There are Kerley B-lines Richard asymmetric, left greater than right. Findings favor pulmonary edema. There is a some weight patchy distribution of densities. Findings raise impressions superimposed infectious infiltrate. Possible nodule is identified  at the right lung base measuring 1.6 cm and  followup is recommended. There is dense atherosclerotic calcification of the thoracic aorta.  IMPRESSION: 1. Cardiomegaly and interstitial changes consistent with pulmonary edema. 2. Patchy appearance of the lung parenchyma bilaterally consistent with superimposed infectious process. Alternatively the findings may be related to asymmetric pulmonary edema. 3. Possible right lower lobe lung nodule warrants followup. Follow-up chest x-ray to document clearing is recommended. If this density fails to clear, followup chest CT would be recommended.   Electronically Signed   By: Shon Hale M.D.   On: 08/12/2014 19:04     EKG Interpretation   Date/Time:  Monday August 12 2014 17:46:21 EDT Ventricular Rate:  74 PR Interval:  168 QRS Duration: 92 QT Interval:  380 QTC Calculation: 421 R Axis:   49 Text Interpretation:  Sinus rhythm with occasional Premature ventricular  complexes ST \\T \ T wave abnormality, consider inferolateral ischemia  Confirmed by Evea Sheek  MD, Lavone Barrientes ([id number]) on 08/12/2014 6:18:00 PM      MDM   Final diagnoses:  Acute pulmonary edema  Hypoxia  Elevated brain natriuretic peptide (BNP) level  Elevated troponin  Elevated LFTs    6:40 PM 73 y.o. female w hx of CAD s/p CABG, copd, PVD, CHF, DM, w/ recent admission for gi bleed (hgb of 5.3) who pw sob since recent d/c. Pt seen by pcp today w/ O2 sat 85% on RA. Reportedly was on O2 via [place] per family while inpt recently. She was not d/c home on O2. Sob gradually worsening since d/c. Denies cp. Denies cough. No fevers. O2 sat 81% on RA here. VS otherwise unremarkable.   Sx likely related to CHF and pulm edema. Doubt infectious process given no elev in wbc, no cough, no fever. Consulted cards for admission.     Pamella Pert, MD 08/12/14 [phone]

## 2014-08-12 NOTE — ED Notes (Signed)
Dr Harrison at bedside

## 2014-08-12 NOTE — H&P (Signed)
History and Physical  Patient ID: [name] MRN: [id number], SOB: 09/17/1941 73 y.o. Date of Encounter: 08/12/2014, 10:25 PM  Primary Physician: Nyoka Cowden, MD Primary Cardiologist: Dr. Meda Coffee  Chief Complaint: shortness of breath  HPI: 73 y.o. female w/ PMHx significant for CAD s/p CABG 2001, PAF, HTN, HFpEF, COPD who presented to [facility] on 08/12/2014 with complaints of shortness of breath. She was just discharged from the hospital on 9/9 after hospitalization for GI bleed and troponin elevation. Her daughter reports that she required oxygen during the entire hospitalization and day of discharge, she was suddenly discharged without it. At home on day of discharge, she felt tired and weak and out of breath with very minimal activity. Over the next couple of days, she has deteriorated further. Some relief with inhalers for COPD. Perhaps some increase in LE edema. Unsure about weight gain. Sleeps on 2 pillows which is old. No palpitations.  No chest pain. No further bleeding.NO fever chills.  Saw PCP today and was hypoxic to the 80s in clinic --> sent to ER. Currently requiring 3 liters for sats of 92%.  In the ER, hemocult was negative.   EKG revealed NSR with lateral TWI, new from 9/8 though improved from 9/3. CXR with vascular congestion. Labs are significant for BNP of 8000, troponin of 0.55., hct of 30.   Past Medical History  Diagnosis Date  . ANEMIA 08/28/2010  . CAD (coronary artery disease)     a. s/p CABGx4 in 2001.  Marland Kitchen COLONIC POLYPS, HX OF 06/15/2007  . COPD (chronic obstructive pulmonary disease)   . Depressive disorder   . GERD 12/19/2009  . Hyperlipidemia   . Hypertension   . HYPOTENSION 08/11/2010  . Osteoarthritis     Lumbar stenosis  . PVD (peripheral vascular disease)     a. Duplex 03/2013: stable moderate carotid dz - 51-70% RICA, 01-74% LICA, L subclavian artery to CCA stent widely patent.  b. Prior R fempop bypass graft, R CIA stent.   . Lung abscess 2011  . CHF (congestive heart failure)   . Hx of colonoscopy   . Paroxysmal atrial flutter     a. Dx 01/2014, placed on Xarelto.  . Aortic stenosis     a. Mild by echo 01/2013.  . Diabetes mellitus without complication      Surgical History:  Past Surgical History  Procedure Laterality Date  . L subclavian bypass  07/2004  . Tubal ligation    . Coronary artery bypass graft  05/2000    LIMA-LAD, SVG-OM, SVG-PDA-PL  . Femoral popliteal bypass-r  04/1999  . Right common iliac pta with stent placement  07/2000  . Right leg blockage  2003  . Aorta bifemoral bypass graft  2004  . Left cea  2005  . Lung cyst biopsy  2011  . Esophagogastroduodenoscopy N/A 08/03/2014    Procedure: ESOPHAGOGASTRODUODENOSCOPY (EGD);  Surgeon: Juanita Craver, MD;  Location: [facility] ENDOSCOPY;  Service: Endoscopy;  Laterality: N/A;  . Colonoscopy Left 08/05/2014    Procedure: COLONOSCOPY;  Surgeon: Juanita Craver, MD;  Location: [place];  Service: Endoscopy;  Laterality: Left;  Freda Munro capsule study N/A 08/06/2014    Procedure: GIVENS CAPSULE STUDY;  Surgeon: Irene Shipper, MD;  Location: [place];  Service: Endoscopy;  Laterality: N/A;     Home Meds: Prior to Admission medications   Medication Sig Start Date End Date Taking? Authorizing Provider  albuterol (PROVENTIL HFA;VENTOLIN HFA) 108 (90 BASE) MCG/ACT inhaler Inhale 2 puffs  into the lungs every 4 (four) hours as needed for wheezing or shortness of breath.   Yes Historical Provider, MD  amiodarone (PACERONE) 200 MG tablet Take 1 tablet (200 mg total) by mouth daily. 07/03/14  Yes Dorothy Spark, MD  atorvastatin (LIPITOR) 40 MG tablet Take 1 tablet (40 mg total) by mouth every evening. 05/23/14  Yes Dorothy Spark, MD  cilostazol (PLETAL) 100 MG tablet Take 100 mg by mouth 2 (two) times daily.   Yes Historical Provider, MD  diltiazem (CARDIZEM CD) 120 MG 24 hr capsule Take 1 capsule by mouth daily. 03/18/14  Yes Historical Provider, MD   fluconazole (DIFLUCAN) 100 MG tablet Take 1 tablet (100 mg total) by mouth daily. 08/07/14  Yes Shanker Kristeen Mans, MD  furosemide (LASIX) 40 MG tablet Take 0.5 tablets (20 mg total) by mouth daily. 04/13/14  Yes Adeline C Viyuoh, MD  gabapentin (NEURONTIN) 600 MG tablet Take 600 mg by mouth daily as needed. For pain 07/23/13  Yes Marletta Lor, MD  glimepiride (AMARYL) 1 MG tablet Take 1 mg by mouth daily with breakfast.   Yes Historical Provider, MD  HYDROcodone-acetaminophen (NORCO/VICODIN) 5-325 MG per tablet Take 0.5 tablets by mouth every 6 (six) hours as needed for moderate pain. 03/25/14  Yes Marletta Lor, MD  LORazepam (ATIVAN) 0.5 MG tablet Take 0.5 mg by mouth 2 (two) times daily.   Yes Historical Provider, MD  metFORMIN (GLUCOPHAGE) 500 MG tablet Take 1 tablet (500 mg total) by mouth 2 (two) times daily with a meal. 04/26/14  Yes Marletta Lor, MD  metoprolol (LOPRESSOR) 50 MG tablet Take 25 mg by mouth 2 (two) times daily.  04/13/14  Yes Adeline Saralyn Pilar, MD  Multiple Vitamins-Minerals (CENTRUM SILVER ULTRA WOMENS PO) Take 1 capsule by mouth daily.    Yes Historical Provider, MD  nitroGLYCERIN (NITROSTAT) 0.4 MG SL tablet Place 0.4 mg under the tongue every 5 (five) minutes as needed for chest pain.   Yes Historical Provider, MD  potassium chloride (K-DUR) 10 MEQ tablet Take 1 tablet (10 mEq total) by mouth daily. 03/12/14  Yes Peter M Martinique, MD  promethazine (PHENERGAN) 25 MG tablet Take 1 tablet (25 mg total) by mouth every 6 (six) hours as needed for nausea or vomiting. 03/25/14  Yes Marletta Lor, MD  sertraline (ZOLOFT) 50 MG tablet Take 25 mg by mouth daily.    Yes Historical Provider, MD  tiotropium (SPIRIVA) 18 MCG inhalation capsule Place 1 capsule (18 mcg total) into inhaler and inhale daily. 08/08/14  Yes Elsie Stain, MD    Allergies:  Allergies  Allergen Reactions  . Codeine Phosphate     REACTION: unspecified    History   Social History  .  Marital Status: Widowed    Spouse Name: N/A    Number of Children: 2  . Years of Education: N/A   Occupational History  . Receptionist    Social History Main Topics  . Smoking status: Former Smoker -- 1.50 packs/day for 40 years    Types: Cigarettes    Quit date: 06/29/2000  . Smokeless tobacco: Never Used  . Alcohol Use: No  . Drug Use: No  . Sexual Activity: Not on file   Other Topics Concern  . Not on file   Social History Narrative  . No narrative on file     Family History  Problem Relation Age of Onset  . Diabetes Father   . Heart attack Mother   .  Diabetes    . Coronary artery disease    . Colon cancer Neg Hx     Review of Systems General: see HPI Cardiovascular: negative for chest pain, shortness of breath, dyspnea on exertion, edema, orthopnea, palpitations, paroxysmal nocturnal dyspnea  Dermatological: negative for rash Respiratory: negative for cough or wheezing Urologic: negative for hematuria Abdominal: negative for nausea, vomiting, diarrhea, bright red blood per rectum, melena, or hematemesis Neurologic: negative for visual changes, syncope, or dizziness All other systems reviewed and are otherwise negative except as noted above.  Labs:   Lab Results  Component Value Date   WBC 9.9 08/12/2014   HGB 9.2* 08/12/2014   HCT 30.2* 08/12/2014   MCV 81.6 08/12/2014   PLT 128* 08/12/2014    Recent Labs Lab 08/12/14 1825  NA 137  K 4.6  CL 98  CO2 19  BUN 24*  CREATININE 1.23*  CALCIUM 9.7  PROT 7.5  BILITOT 0.9  ALKPHOS 141*  ALT 156*  AST 107*  GLUCOSE 126*    Recent Labs  08/12/14 1825  TROPONINI 0.55*   Lab Results  Component Value Date   CHOL 118 04/11/2014   HDL 46 04/11/2014   LDLCALC 45 04/11/2014   TRIG 137 04/11/2014   Radiology/Studies:  Dg Chest 2 View  08/12/2014   CLINICAL DATA:  Recently discharged. Anemic and shortness of breath. Low oxygen levels. High blood pressure. History of diabetes, aortic stenosis, anemia, CAD,  COPD, GERD, PVD, CHF.  EXAM: CHEST  2 VIEW  COMPARISON:  08/02/2014 and multiple prior studies  FINDINGS: Patient has had median sternotomy and CABG. The heart is enlarged. There are Kerley B-lines Richard asymmetric, left greater than right. Findings favor pulmonary edema. There is a some weight patchy distribution of densities. Findings raise impressions superimposed infectious infiltrate. Possible nodule is identified at the right lung base measuring 1.6 cm and followup is recommended. There is dense atherosclerotic calcification of the thoracic aorta.  IMPRESSION: 1. Cardiomegaly and interstitial changes consistent with pulmonary edema. 2. Patchy appearance of the lung parenchyma bilaterally consistent with superimposed infectious process. Alternatively the findings may be related to asymmetric pulmonary edema. 3. Possible right lower lobe lung nodule warrants followup. Follow-up chest x-ray to document clearing is recommended. If this density fails to clear, followup chest CT would be recommended.   Electronically Signed   By: Shon Hale M.D.   On: 08/12/2014 19:04   Dg Chest Port 1 View  08/02/2014   CLINICAL DATA:  Shortness of Breath  EXAM: PORTABLE CHEST - 1 VIEW  COMPARISON:  08/01/2014  FINDINGS: Prior CABG. Heart is borderline in size. Diffuse interstitial prominence throughout the lungs, increased since prior study, likely interstitial edema. No confluent opacities or effusions. No acute bony abnormality.  IMPRESSION: Increasing interstitial prominence, likely interstitial edema.   Electronically Signed   By: Rolm Baptise M.D.   On: 08/02/2014 07:11   Dg Chest Portable 1 View  08/02/2014   CLINICAL DATA:  MELENA FALL  EXAM: PORTABLE CHEST - 1 VIEW  COMPARISON:  04/09/2014  FINDINGS: Previous CABG. Heart size upper limits normal. Patchy interstitial opacities in both lung bases slightly more conspicuous than on prior study. No focal airspace disease. No overt edema. No effusion. Atheromatous aorta.   IMPRESSION: 1. Probable mild bibasilar atelectasis. Otherwise stable appearance since previous exam.   Electronically Signed   By: Arne Cleveland M.D.   On: 08/02/2014 00:55     EKG: see hpi  Physical Exam: Blood pressure 118/49,  pulse 71, temperature 97.6 F (36.4 C), temperature source Oral, resp. rate 17, SpO2 89.00%. General: Well developed, mildly ill appearing Head: Normocephalic, atraumatic, sclera non-icteric, nares are without discharge Neck: Supple. Negative for carotid bruits. JVD not elevated. Lungs: crackles at bases Heart: RRR with S1 S2.2/6 SEM c/w AS Abdomen: Soft, non-tender, non-distended with normoactive bowel sounds. No rebound/guarding. No obvious abdominal masses. Msk:  Strength and tone appear normal for age. Extremities: +1-2 pretibial edema No clubbing or cyanosis. Distal pedal pulses are 2+ and equal bilaterally. Neuro: Alert and oriented X 3. Moves all extremities spontaneously. Psych:  Responds to questions appropriately with a normal affect.   Problem List 1. Decompensated heart failure with nl EF 2. CAD s/p CABG, elevated troponin 3. COPD 4. Recent GIB, no source found, off anticoagulation 5. Paroxysmal afib, currently in sinus 6. Mild to mod AS 7. LFT abnormalities, ?medication interaction  ASSESSMENT AND PLAN:  73 y.o. female w/ PMHx significant for CAD s/p CABG 2001, PAF, HTN, HFpEF, COPD who presented to [facility] on 08/12/2014 with complaints of shortness of breath.   Likely combination of underlying COPD complicated by mild fluid overload resulting in heart failure symptoms. Elevated BNP, LE swelling, and chest xray all consistent. Unclear precipitant though her lasix previously was 40 qday back in May, now only 20. Also, recent GIB and subsequent anemia could be provoking fluid retention. Finally, she adds salt to her food as well. Will jumpstart diuresis with lasix. COntinune COPD regimen.  Noted elevated troponin; possibly from last  hospitalization with the supply ischemia. Will trend to see which direction it is going; no symptoms of unstable nature (all exercise provoked). Noted EKG changes, worse from 9/9 but better than 9/4. ?More demand ischemia in the setting of CHF/hypoxia. Add back baby aspirin though understanding risks with recent GIB. Continue BB.  Liver abnormalities also discovered. Several new medications that could be contributing to this including fluconazole (started due to candidiasis on EGD), amiodarone, and continuation of statin. Will hold statin and recheck. Pharmacy consult to ensure I am not missing any others.  Recent GIB- no evidence of continued bleeding. Hemoccult negative. As above, added aspirin.   Continue glimeripide. Hold metformin. Cover with sliding scale.  Urine checked in ER for unclear reasons; dirty vs. infection. Asymptomatic. Await cultures to determine if treatment needed.   Full code. Low salt diet. Low dose subq lovenox for DVT protection.   Signed, Elias Else, Mila Homer MD 08/12/2014, 10:25 PM

## 2014-08-13 DIAGNOSIS — R799 Abnormal finding of blood chemistry, unspecified: Secondary | ICD-10-CM

## 2014-08-13 DIAGNOSIS — I2510 Atherosclerotic heart disease of native coronary artery without angina pectoris: Secondary | ICD-10-CM | POA: Diagnosis present

## 2014-08-13 DIAGNOSIS — F3289 Other specified depressive episodes: Secondary | ICD-10-CM | POA: Diagnosis present

## 2014-08-13 DIAGNOSIS — I4891 Unspecified atrial fibrillation: Secondary | ICD-10-CM | POA: Diagnosis present

## 2014-08-13 DIAGNOSIS — I509 Heart failure, unspecified: Secondary | ICD-10-CM | POA: Diagnosis present

## 2014-08-13 DIAGNOSIS — K219 Gastro-esophageal reflux disease without esophagitis: Secondary | ICD-10-CM | POA: Diagnosis present

## 2014-08-13 DIAGNOSIS — I739 Peripheral vascular disease, unspecified: Secondary | ICD-10-CM | POA: Diagnosis present

## 2014-08-13 DIAGNOSIS — I25810 Atherosclerosis of coronary artery bypass graft(s) without angina pectoris: Secondary | ICD-10-CM

## 2014-08-13 DIAGNOSIS — I5033 Acute on chronic diastolic (congestive) heart failure: Secondary | ICD-10-CM | POA: Diagnosis present

## 2014-08-13 DIAGNOSIS — Z951 Presence of aortocoronary bypass graft: Secondary | ICD-10-CM | POA: Diagnosis not present

## 2014-08-13 DIAGNOSIS — E8809 Other disorders of plasma-protein metabolism, not elsewhere classified: Secondary | ICD-10-CM | POA: Diagnosis present

## 2014-08-13 DIAGNOSIS — J810 Acute pulmonary edema: Secondary | ICD-10-CM

## 2014-08-13 DIAGNOSIS — I248 Other forms of acute ischemic heart disease: Secondary | ICD-10-CM | POA: Diagnosis present

## 2014-08-13 DIAGNOSIS — Z79899 Other long term (current) drug therapy: Secondary | ICD-10-CM | POA: Diagnosis not present

## 2014-08-13 DIAGNOSIS — R7989 Other specified abnormal findings of blood chemistry: Secondary | ICD-10-CM

## 2014-08-13 DIAGNOSIS — Z7982 Long term (current) use of aspirin: Secondary | ICD-10-CM | POA: Diagnosis not present

## 2014-08-13 DIAGNOSIS — Z87891 Personal history of nicotine dependence: Secondary | ICD-10-CM | POA: Diagnosis not present

## 2014-08-13 DIAGNOSIS — M48061 Spinal stenosis, lumbar region without neurogenic claudication: Secondary | ICD-10-CM | POA: Diagnosis present

## 2014-08-13 DIAGNOSIS — I4892 Unspecified atrial flutter: Secondary | ICD-10-CM

## 2014-08-13 DIAGNOSIS — D500 Iron deficiency anemia secondary to blood loss (chronic): Secondary | ICD-10-CM | POA: Diagnosis present

## 2014-08-13 DIAGNOSIS — R0602 Shortness of breath: Secondary | ICD-10-CM | POA: Diagnosis present

## 2014-08-13 DIAGNOSIS — J449 Chronic obstructive pulmonary disease, unspecified: Secondary | ICD-10-CM | POA: Diagnosis present

## 2014-08-13 DIAGNOSIS — Z7901 Long term (current) use of anticoagulants: Secondary | ICD-10-CM | POA: Diagnosis not present

## 2014-08-13 DIAGNOSIS — R7981 Abnormal blood-gas level: Secondary | ICD-10-CM | POA: Diagnosis present

## 2014-08-13 DIAGNOSIS — I10 Essential (primary) hypertension: Secondary | ICD-10-CM | POA: Diagnosis present

## 2014-08-13 DIAGNOSIS — E785 Hyperlipidemia, unspecified: Secondary | ICD-10-CM | POA: Diagnosis present

## 2014-08-13 DIAGNOSIS — F329 Major depressive disorder, single episode, unspecified: Secondary | ICD-10-CM | POA: Diagnosis present

## 2014-08-13 DIAGNOSIS — K7200 Acute and subacute hepatic failure without coma: Secondary | ICD-10-CM | POA: Diagnosis present

## 2014-08-13 DIAGNOSIS — I359 Nonrheumatic aortic valve disorder, unspecified: Secondary | ICD-10-CM | POA: Diagnosis present

## 2014-08-13 DIAGNOSIS — E119 Type 2 diabetes mellitus without complications: Secondary | ICD-10-CM | POA: Diagnosis present

## 2014-08-13 LAB — GLUCOSE, CAPILLARY
Glucose-Capillary: 104 mg/dL — ABNORMAL HIGH (ref 70–99)
Glucose-Capillary: 143 mg/dL — ABNORMAL HIGH (ref 70–99)
Glucose-Capillary: 105 mg/dL — ABNORMAL HIGH (ref 70–99)
Glucose-Capillary: 113 mg/dL — ABNORMAL HIGH (ref 70–99)

## 2014-08-13 LAB — HEPATIC FUNCTION PANEL
ALBUMIN: 2.5 g/dL — AB (ref 3.5–5.2)
ALK PHOS: 115 U/L (ref 39–117)
ALT: 120 U/L — ABNORMAL HIGH (ref 0–35)
AST: 70 U/L — AB (ref 0–37)
Bilirubin, Direct: 0.3 mg/dL (ref 0.0–0.3)
Indirect Bilirubin: 0.4 mg/dL (ref 0.3–0.9)
TOTAL PROTEIN: 6.4 g/dL (ref 6.0–8.3)
Total Bilirubin: 0.7 mg/dL (ref 0.3–1.2)

## 2014-08-13 LAB — BASIC METABOLIC PANEL
ANION GAP: 16 — AB (ref 5–15)
BUN: 24 mg/dL — ABNORMAL HIGH (ref 6–23)
CHLORIDE: 99 meq/L (ref 96–112)
CO2: 24 mEq/L (ref 19–32)
Calcium: 9.1 mg/dL (ref 8.4–10.5)
Creatinine, Ser: 1.27 mg/dL — ABNORMAL HIGH (ref 0.50–1.10)
GFR calc Af Amer: 48 mL/min — ABNORMAL LOW (ref >90)
GFR calc non Af Amer: 41 mL/min — ABNORMAL LOW (ref >90)
Glucose, Bld: 109 mg/dL — ABNORMAL HIGH (ref 70–99)
Potassium: 4 mEq/L (ref 3.7–5.3)
Sodium: 139 mEq/L (ref 137–147)

## 2014-08-13 LAB — TROPONIN I: TROPONIN I: 0.4 ng/mL — AB (ref <0.30)

## 2014-08-13 LAB — CK: CK TOTAL: 95 U/L (ref 7–177)

## 2014-08-13 MED ORDER — CETYLPYRIDINIUM CHLORIDE 0.05 % MT LIQD
7.0000 mL | Freq: Two times a day (BID) | OROMUCOSAL | Status: DC
  Administered 2014-08-13 – 2014-08-16 (×5): 7 mL via OROMUCOSAL

## 2014-08-13 MED ORDER — FUROSEMIDE 10 MG/ML IJ SOLN
40.0000 mg | Freq: Two times a day (BID) | INTRAMUSCULAR | Status: DC
  Administered 2014-08-13 – 2014-08-14 (×3): 40 mg via INTRAVENOUS
  Filled 2014-08-13 (×4): qty 4

## 2014-08-13 MED ORDER — FLUCONAZOLE 100 MG PO TABS
100.0000 mg | ORAL_TABLET | Freq: Every day | ORAL | Status: DC
  Filled 2014-08-13: qty 1

## 2014-08-13 NOTE — Progress Notes (Signed)
MEDICATION RELATED CONSULT NOTE - INITIAL   Pharmacy Consult for elevated liver enzymes Indication:   Allergies  Allergen Reactions  . Codeine Phosphate     REACTION: unspecified    Patient Measurements: Height: 5' 2.5" (158.8 cm) Weight: 156 lb 6.4 oz (70.943 kg) IBW/kg (Calculated) : 51.25 Adjusted Body Weight:   Vital Signs: Temp: 97.5 F (36.4 C) (09/14 2317) Temp src: Oral (09/14 1737) BP: 130/51 mmHg (09/14 2317) Pulse Rate: 81 (09/14 2317) Intake/Output from previous day: 09/14 0701 - 09/15 0700 In: -  Out: 150 [Urine:150] Intake/Output from this shift: Total I/O In: -  Out: 150 [Urine:150]  Labs:  Recent Labs  08/12/14 1740 08/12/14 1825  WBC 9.9  --   HGB 9.2*  --   HCT 30.2*  --   PLT 128*  --   CREATININE  --  1.23*  ALBUMIN  --  2.9*  PROT  --  7.5  AST  --  107*  ALT  --  156*  ALKPHOS  --  141*  BILITOT  --  0.9   Estimated Creatinine Clearance: 38.6 ml/min (by C-G formula based on Cr of 1.23).   Microbiology: Recent Results (from the past 720 hour(s))  MRSA PCR SCREENING     Status: None   Collection Time    08/02/14  5:59 AM      Result Value Ref Range Status   MRSA by PCR NEGATIVE  NEGATIVE Final   Comment:            The GeneXpert MRSA Assay (FDA     approved for NASAL specimens     only), is one component of a     comprehensive MRSA colonization     surveillance program. It is not     intended to diagnose MRSA     infection nor to guide or     monitor treatment for     MRSA infections.    Medical History: Past Medical History  Diagnosis Date  . ANEMIA 08/28/2010  . CAD (coronary artery disease)     a. s/p CABGx4 in 2001.  Marland Kitchen COLONIC POLYPS, HX OF 06/15/2007  . COPD (chronic obstructive pulmonary disease)   . Depressive disorder   . GERD 12/19/2009  . Hyperlipidemia   . Hypertension   . HYPOTENSION 08/11/2010  . Osteoarthritis     Lumbar stenosis  . PVD (peripheral vascular disease)     a. Duplex 03/2013: stable  moderate carotid dz - 76-73% RICA, 41-93% LICA, L subclavian artery to CCA stent widely patent.  b. Prior R fempop bypass graft, R CIA stent.  . Lung abscess 2011  . CHF (congestive heart failure)   . Hx of colonoscopy   . Paroxysmal atrial flutter     a. Dx 01/2014, placed on Xarelto.  . Aortic stenosis     a. Mild by echo 01/2013.  . Diabetes mellitus without complication     Medications:  Prescriptions prior to admission  Medication Sig Dispense Refill  . albuterol (PROVENTIL HFA;VENTOLIN HFA) 108 (90 BASE) MCG/ACT inhaler Inhale 2 puffs into the lungs every 4 (four) hours as needed for wheezing or shortness of breath.      Marland Kitchen amiodarone (PACERONE) 200 MG tablet Take 1 tablet (200 mg total) by mouth daily.  90 tablet  6  . atorvastatin (LIPITOR) 40 MG tablet Take 1 tablet (40 mg total) by mouth every evening.  90 tablet  1  . cilostazol (PLETAL) 100 MG tablet Take 100  mg by mouth 2 (two) times daily.      Marland Kitchen diltiazem (CARDIZEM CD) 120 MG 24 hr capsule Take 1 capsule by mouth daily.      . fluconazole (DIFLUCAN) 100 MG tablet Take 1 tablet (100 mg total) by mouth daily.  10 tablet  0  . furosemide (LASIX) 40 MG tablet Take 0.5 tablets (20 mg total) by mouth daily.  90 tablet  6  . gabapentin (NEURONTIN) 600 MG tablet Take 600 mg by mouth daily as needed. For pain      . glimepiride (AMARYL) 1 MG tablet Take 1 mg by mouth daily with breakfast.      . HYDROcodone-acetaminophen (NORCO/VICODIN) 5-325 MG per tablet Take 0.5 tablets by mouth every 6 (six) hours as needed for moderate pain.  90 tablet  0  . LORazepam (ATIVAN) 0.5 MG tablet Take 0.5 mg by mouth 2 (two) times daily.      . metFORMIN (GLUCOPHAGE) 500 MG tablet Take 1 tablet (500 mg total) by mouth 2 (two) times daily with a meal.  180 tablet  3  . metoprolol (LOPRESSOR) 50 MG tablet Take 25 mg by mouth 2 (two) times daily.       . Multiple Vitamins-Minerals (CENTRUM SILVER ULTRA WOMENS PO) Take 1 capsule by mouth daily.       .  nitroGLYCERIN (NITROSTAT) 0.4 MG SL tablet Place 0.4 mg under the tongue every 5 (five) minutes as needed for chest pain.      . potassium chloride (K-DUR) 10 MEQ tablet Take 1 tablet (10 mEq total) by mouth daily.  30 tablet  6  . promethazine (PHENERGAN) 25 MG tablet Take 1 tablet (25 mg total) by mouth every 6 (six) hours as needed for nausea or vomiting.  20 tablet  2  . sertraline (ZOLOFT) 50 MG tablet Take 25 mg by mouth daily.       Marland Kitchen tiotropium (SPIRIVA) 18 MCG inhalation capsule Place 1 capsule (18 mcg total) into inhaler and inhale daily.  20 capsule  0    Assessment: Elevated liver enzymes. Agree with cardiology that most likely culprit is atorvastatin 40mg   which has been dc'ed. Amiodarone and fluconazole are at  low end of dosing range  and if these agents are causative it is most likely an additive effect with all three of these agents in combination.  May also consider OTC tylenol consumption not documented on PTA meds. apap level?  The flucanazole is for a 10 day course due to end on the 18th  so 6/10 days may be sufficient for indication of candidiasis on EGD.   Suggest PCSK9 consult with  Cardiology lipid clinic for study enrollment if statin intolerant and further lipid control is desired.   Goal of Therapy:  Normalized liver enzymes.   Plan:   Suggest DC last 4 days of fluconazole and PCSK9 referral.    Curlene Dolphin 08/13/2014,1:48 AM

## 2014-08-13 NOTE — Progress Notes (Signed)
Subjective:  73 year old female with coronary artery disease status post bypass in 2001, paroxysmal atrial fibrillation, diastolic heart failure chronic, hypertension, COPD discharge on 9/9 secondary to GI bleed and troponin elevation sent from PCP hypoxic in the 80s to the emergency room requiring 3 L of oxygen.  Objective:  Vital Signs in the last 24 hours: Temp:  [97.3 F (36.3 C)-98.2 F (36.8 C)] 98.2 F (36.8 C) (09/15 0630) Pulse Rate:  [64-81] 76 (09/15 0658) Resp:  [16-23] 18 (09/15 0630) BP: (96-132)/(31-94) 104/48 mmHg (09/15 0658) SpO2:  [81 %-97 %] 90 % (09/15 0630) Weight:  [154 lb 4.8 oz (69.99 kg)-156 lb 6.4 oz (70.943 kg)] 154 lb 4.8 oz (69.99 kg) (09/15 0700)  Intake/Output from previous day: 09/14 0701 - 09/15 0700 In: -  Out: 800 [Urine:800]   Physical Exam: General: Well developed, well nourished, in no acute distress. Head:  Normocephalic and atraumatic. No significant JVD Lungs: Clear to auscultation and percussion. Heart: Normal S1 and S2.  1/6 systolic right upper sternal border murmur, rubs or gallops.  Abdomen: soft, non-tender, positive bowel sounds. Extremities: No clubbing or cyanosis. No edema. Neurologic: Alert and oriented x 3.    Lab Results:  Recent Labs  08/12/14 1740  WBC 9.9  HGB 9.2*  PLT 128*    Recent Labs  08/12/14 1825 08/13/14 0352  NA 137 139  K 4.6 4.0  CL 98 99  CO2 19 24  GLUCOSE 126* 109*  BUN 24* 24*  CREATININE 1.23* 1.27*    Recent Labs  08/12/14 1825 08/13/14 0352  TROPONINI 0.55* 0.40*   Hepatic Function Panel  Recent Labs  08/13/14 0352  PROT 6.4  ALBUMIN 2.5*  AST 70*  ALT 120*  ALKPHOS 115  BILITOT 0.7  BILIDIR 0.3  IBILI 0.4    Imaging: Dg Chest 2 View  08/12/2014   CLINICAL DATA:  Recently discharged. Anemic and shortness of breath. Low oxygen levels. High blood pressure. History of diabetes, aortic stenosis, anemia, CAD, COPD, GERD, PVD, CHF.  EXAM: CHEST  2 VIEW  COMPARISON:   08/02/2014 and multiple prior studies  FINDINGS: Patient has had median sternotomy and CABG. The heart is enlarged. There are Kerley B-lines Richard asymmetric, left greater than right. Findings favor pulmonary edema. There is a some weight patchy distribution of densities. Findings raise impressions superimposed infectious infiltrate. Possible nodule is identified at the right lung base measuring 1.6 cm and followup is recommended. There is dense atherosclerotic calcification of the thoracic aorta.  IMPRESSION: 1. Cardiomegaly and interstitial changes consistent with pulmonary edema. 2. Patchy appearance of the lung parenchyma bilaterally consistent with superimposed infectious process. Alternatively the findings may be related to asymmetric pulmonary edema. 3. Possible right lower lobe lung nodule warrants followup. Follow-up chest x-ray to document clearing is recommended. If this density fails to clear, followup chest CT would be recommended.   Electronically Signed   By: Shon Hale M.D.   On: 08/12/2014 19:04   Personally viewed.   Telemetry: Normal rhythm  Personally viewed.   EKG:  Sinus rhythm, poor R wave progression, nonspecific ST-T wave changes, baseline wander  Cardiac Studies:  Normal ejection fraction, mild pulmonary hypertension, dilated left atrium  Assessment/Plan:   1. Acute on chronic diastolic heart failure  - Feels better after diuresis last night. Continue with IV Lasix today.  - BNP 8000 on admission. Mildly elevated troponin, demand ischemia.  2. Demand ischemia-  - Continued mildly elevated troponin.  - No cardiac  catheterization, no symptoms of unstable angina.  - Low-dose aspirin. Watch for signs of GI bleeding. Continue with beta blocker.  3. COPD  - Chronic, may be playing a role in her dyspnea.  - Continue with oxygen therapy for now. Continue COPD regimen.  4. Diabetes-holding metformin through hospitalization. Continue with glimiperide and sliding  scale.  5. Urinalysis-abnormal. Awaiting cultures to determine treatment.  6. Elevated LFTs  - Currently on fluconazole day 6 of 10 started for candidiasis on EGD. I will discontinue fluconazole at this point secondary to elevated liver enzymes. Please see pharmacy note which suggests discontinuation as well.  - Amiodarone, currently in sinus rhythm. Pharmacist felt that amiodarone dose was low and therefore likely not culprit for elevated enzymes.  - Statin on hold. If LFTs remain an issue with statins, could consider PCSK 9.  7. Paroxysmal atrial fibrillation  - Currently sinus rhythm on amiodarone, low-dose  8. Coronary artery disease  - Bypass 2001, stable. Elevated troponin secondary to demand ischemia.  9. Peripheral vascular disease  - Aortobifemoral in 2005, stable  Continue with diuresis. Improvement. Hopefully discharge tomorrow.   Vannie Hilgert, [place] 08/13/2014, 7:45 AM

## 2014-08-13 NOTE — Progress Notes (Signed)
MEDICATION RELATED CONSULT NOTE - INITIAL   Pharmacy Consult for elevated liver enzymes Indication:   Allergies  Allergen Reactions  . Codeine Phosphate     REACTION: unspecified    Patient Measurements: Height: 5' 2.5" (158.8 cm) Weight: 156 lb 6.4 oz (70.943 kg) IBW/kg (Calculated) : 51.25 Adjusted Body Weight:   Vital Signs: Temp: 97.5 F (36.4 C) (09/14 2317) Temp src: Oral (09/14 1737) BP: 130/51 mmHg (09/14 2317) Pulse Rate: 81 (09/14 2317) Intake/Output from previous day: 09/14 0701 - 09/15 0700 In: -  Out: 150 [Urine:150] Intake/Output from this shift: Total I/O In: -  Out: 150 [Urine:150]  Labs:  Recent Labs  08/12/14 1740 08/12/14 1825  WBC 9.9  --   HGB 9.2*  --   HCT 30.2*  --   PLT 128*  --   CREATININE  --  1.23*  ALBUMIN  --  2.9*  PROT  --  7.5  AST  --  107*  ALT  --  156*  ALKPHOS  --  141*  BILITOT  --  0.9   Estimated Creatinine Clearance: 38.6 ml/min (by C-G formula based on Cr of 1.23).   Microbiology: Recent Results (from the past 720 hour(s))  MRSA PCR SCREENING     Status: None   Collection Time    08/02/14  5:59 AM      Result Value Ref Range Status   MRSA by PCR NEGATIVE  NEGATIVE Final   Comment:            The GeneXpert MRSA Assay (FDA     approved for NASAL specimens     only), is one component of a     comprehensive MRSA colonization     surveillance program. It is not     intended to diagnose MRSA     infection nor to guide or     monitor treatment for     MRSA infections.    Medical History: Past Medical History  Diagnosis Date  . ANEMIA 08/28/2010  . CAD (coronary artery disease)     a. s/p CABGx4 in 2001.  Marland Kitchen COLONIC POLYPS, HX OF 06/15/2007  . COPD (chronic obstructive pulmonary disease)   . Depressive disorder   . GERD 12/19/2009  . Hyperlipidemia   . Hypertension   . HYPOTENSION 08/11/2010  . Osteoarthritis     Lumbar stenosis  . PVD (peripheral vascular disease)     a. Duplex 03/2013: stable  moderate carotid dz - 27-78% RICA, 24-23% LICA, L subclavian artery to CCA stent widely patent.  b. Prior R fempop bypass graft, R CIA stent.  . Lung abscess 2011  . CHF (congestive heart failure)   . Hx of colonoscopy   . Paroxysmal atrial flutter     a. Dx 01/2014, placed on Xarelto.  . Aortic stenosis     a. Mild by echo 01/2013.  . Diabetes mellitus without complication     Medications:  Prescriptions prior to admission  Medication Sig Dispense Refill  . albuterol (PROVENTIL HFA;VENTOLIN HFA) 108 (90 BASE) MCG/ACT inhaler Inhale 2 puffs into the lungs every 4 (four) hours as needed for wheezing or shortness of breath.      Marland Kitchen amiodarone (PACERONE) 200 MG tablet Take 1 tablet (200 mg total) by mouth daily.  90 tablet  6  . atorvastatin (LIPITOR) 40 MG tablet Take 1 tablet (40 mg total) by mouth every evening.  90 tablet  1  . cilostazol (PLETAL) 100 MG tablet Take 100  mg by mouth 2 (two) times daily.      Marland Kitchen diltiazem (CARDIZEM CD) 120 MG 24 hr capsule Take 1 capsule by mouth daily.      . fluconazole (DIFLUCAN) 100 MG tablet Take 1 tablet (100 mg total) by mouth daily.  10 tablet  0  . furosemide (LASIX) 40 MG tablet Take 0.5 tablets (20 mg total) by mouth daily.  90 tablet  6  . gabapentin (NEURONTIN) 600 MG tablet Take 600 mg by mouth daily as needed. For pain      . glimepiride (AMARYL) 1 MG tablet Take 1 mg by mouth daily with breakfast.      . HYDROcodone-acetaminophen (NORCO/VICODIN) 5-325 MG per tablet Take 0.5 tablets by mouth every 6 (six) hours as needed for moderate pain.  90 tablet  0  . LORazepam (ATIVAN) 0.5 MG tablet Take 0.5 mg by mouth 2 (two) times daily.      . metFORMIN (GLUCOPHAGE) 500 MG tablet Take 1 tablet (500 mg total) by mouth 2 (two) times daily with a meal.  180 tablet  3  . metoprolol (LOPRESSOR) 50 MG tablet Take 25 mg by mouth 2 (two) times daily.       . Multiple Vitamins-Minerals (CENTRUM SILVER ULTRA WOMENS PO) Take 1 capsule by mouth daily.       .  nitroGLYCERIN (NITROSTAT) 0.4 MG SL tablet Place 0.4 mg under the tongue every 5 (five) minutes as needed for chest pain.      . potassium chloride (K-DUR) 10 MEQ tablet Take 1 tablet (10 mEq total) by mouth daily.  30 tablet  6  . promethazine (PHENERGAN) 25 MG tablet Take 1 tablet (25 mg total) by mouth every 6 (six) hours as needed for nausea or vomiting.  20 tablet  2  . sertraline (ZOLOFT) 50 MG tablet Take 25 mg by mouth daily.       Marland Kitchen tiotropium (SPIRIVA) 18 MCG inhalation capsule Place 1 capsule (18 mcg total) into inhaler and inhale daily.  20 capsule  0    Assessment: Elevated liver enzymes. Agree with cardiology that most likely culprit is atorvastatin 40mg   which has been dc'ed. Amiodarone and fluconazole are in fairly low doses and if these agents are causative it is most likely an additive effect with all three of these agents in combination. . The flucanazole is for a 10 day course due to end on the 18th  so 6/10 days may be sufficient for indication. Suggest dc' fluconazole.  Suggest PCSK9 consult with Cardiology lipid clinic for study enrollment if statin intolerant and further lipid control is desired.   Goal of Therapy:  Normalized liver enzymes.   Plan:   Suggest DC last 4 days of fluconazole and PCSK9 referral to cards fellow.   Curlene Dolphin 08/13/2014,1:39 AM

## 2014-08-13 NOTE — Care Management Note (Signed)
    Page 1 of 2   08/16/2014     4:48:55 PM CARE MANAGEMENT NOTE 08/16/2014  Patient:  [name], [name]   Account Number:  [id number]  Date Initiated:  08/13/2014  Documentation initiated by:  Giannina Bartolome  Subjective/Objective Assessment:   Pt adm on 9/14 with CHF, hypoxia.  PTA, pt lives alone and is independent; has supportive daughter.     Action/Plan:   Will follow for dc needs as pt progresses.   Anticipated DC Date:  08/16/2014   Anticipated DC Plan:  [place]  CM consult      [facility] Choice  Resumption Of Svcs/PTA Provider   Choice offered to / List presented to:  C-1 Patient   DME arranged  [facility]      DME agency  [place] arranged  HH-1 RN  [place].   Status of service:  Completed, signed off Medicare Important Message given?  YES (If response is "NO", the following Medicare IM given date fields will be blank) Date Medicare IM given:  08/15/2014 Medicare IM given by:  Lejend Dalby Date Additional Medicare IM given:   Additional Medicare IM given by:    Discharge Disposition:  [place]  Per UR Regulation:  Reviewed for med. necessity/level of care/duration of stay  If discussed at [place] of Stay Meetings, dates discussed:    Comments:  08/16/14 Ellan Lambert, RN, BSN [phone] Pt for dc home today; will need home O2 as expected. Notified [facility] of dc today and need for home O2.  Portable tank delivered to pt prior to dc.  08/15/14 Ellan Lambert, RN, BSN [phone] Pt active with [facility] prior to admission for [facility].  Will resume HHRN for CHF follow up.  Pt will likely need home oxygen at dc, as cont to desat with activity.  Will follow.

## 2014-08-14 DIAGNOSIS — J449 Chronic obstructive pulmonary disease, unspecified: Secondary | ICD-10-CM

## 2014-08-14 DIAGNOSIS — I5021 Acute systolic (congestive) heart failure: Secondary | ICD-10-CM

## 2014-08-14 DIAGNOSIS — I359 Nonrheumatic aortic valve disorder, unspecified: Secondary | ICD-10-CM

## 2014-08-14 DIAGNOSIS — R0902 Hypoxemia: Secondary | ICD-10-CM

## 2014-08-14 DIAGNOSIS — I509 Heart failure, unspecified: Secondary | ICD-10-CM

## 2014-08-14 DIAGNOSIS — D649 Anemia, unspecified: Secondary | ICD-10-CM

## 2014-08-14 DIAGNOSIS — K922 Gastrointestinal hemorrhage, unspecified: Secondary | ICD-10-CM

## 2014-08-14 DIAGNOSIS — D62 Acute posthemorrhagic anemia: Secondary | ICD-10-CM

## 2014-08-14 LAB — CBC
HCT: 23.3 % — ABNORMAL LOW (ref 36.0–46.0)
HCT: 25.6 % — ABNORMAL LOW (ref 36.0–46.0)
Hemoglobin: 7.2 g/dL — ABNORMAL LOW (ref 12.0–15.0)
Hemoglobin: 7.9 g/dL — ABNORMAL LOW (ref 12.0–15.0)
MCH: 24.7 pg — ABNORMAL LOW (ref 26.0–34.0)
MCH: 24.4 pg — ABNORMAL LOW (ref 26.0–34.0)
MCHC: 30.9 g/dL (ref 30.0–36.0)
MCHC: 30.9 g/dL (ref 30.0–36.0)
MCV: 80.1 fL (ref 78.0–100.0)
MCV: 79 fL (ref 78.0–100.0)
Platelets: 287 10*3/uL (ref 150–400)
Platelets: 332 10*3/uL (ref 150–400)
RBC: 2.91 MIL/uL — ABNORMAL LOW (ref 3.87–5.11)
RBC: 3.24 MIL/uL — ABNORMAL LOW (ref 3.87–5.11)
RDW: 18.2 % — ABNORMAL HIGH (ref 11.5–15.5)
RDW: 18.2 % — AB (ref 11.5–15.5)
WBC: 6.7 10*3/uL (ref 4.0–10.5)
WBC: 6.9 10*3/uL (ref 4.0–10.5)

## 2014-08-14 LAB — GLUCOSE, CAPILLARY
Glucose-Capillary: 105 mg/dL — ABNORMAL HIGH (ref 70–99)
Glucose-Capillary: 108 mg/dL — ABNORMAL HIGH (ref 70–99)
Glucose-Capillary: 118 mg/dL — ABNORMAL HIGH (ref 70–99)
Glucose-Capillary: 118 mg/dL — ABNORMAL HIGH (ref 70–99)

## 2014-08-14 LAB — COMPREHENSIVE METABOLIC PANEL
ALT: 78 U/L — ABNORMAL HIGH (ref 0–35)
ANION GAP: 14 (ref 5–15)
AST: 42 U/L — ABNORMAL HIGH (ref 0–37)
Albumin: 2.2 g/dL — ABNORMAL LOW (ref 3.5–5.2)
Alkaline Phosphatase: 103 U/L (ref 39–117)
BILIRUBIN TOTAL: 0.6 mg/dL (ref 0.3–1.2)
BUN: 26 mg/dL — AB (ref 6–23)
CO2: 26 mEq/L (ref 19–32)
CREATININE: 1.33 mg/dL — AB (ref 0.50–1.10)
Calcium: 8.3 mg/dL — ABNORMAL LOW (ref 8.4–10.5)
Chloride: 102 mEq/L (ref 96–112)
GFR calc non Af Amer: 39 mL/min — ABNORMAL LOW (ref >90)
GFR, EST AFRICAN AMERICAN: 45 mL/min — AB (ref >90)
Glucose, Bld: 66 mg/dL — ABNORMAL LOW (ref 70–99)
Potassium: 3.8 mEq/L (ref 3.7–5.3)
Sodium: 142 mEq/L (ref 137–147)
TOTAL PROTEIN: 5.6 g/dL — AB (ref 6.0–8.3)

## 2014-08-14 LAB — PREPARE RBC (CROSSMATCH)

## 2014-08-14 MED ORDER — FUROSEMIDE 40 MG PO TABS
40.0000 mg | ORAL_TABLET | Freq: Every day | ORAL | Status: DC
  Administered 2014-08-14 – 2014-08-16 (×3): 40 mg via ORAL
  Filled 2014-08-14 (×3): qty 1

## 2014-08-14 MED ORDER — SODIUM CHLORIDE 0.9 % IV SOLN
Freq: Once | INTRAVENOUS | Status: AC
  Administered 2014-08-14: 15:00:00 via INTRAVENOUS

## 2014-08-14 MED ORDER — FUROSEMIDE 10 MG/ML IJ SOLN
20.0000 mg | Freq: Once | INTRAMUSCULAR | Status: AC
  Administered 2014-08-14: 20 mg via INTRAVENOUS

## 2014-08-14 NOTE — Clinical Documentation Improvement (Signed)
  64 white female admitted with Acute on Chronic Diastolic Heart Failure.  02 sat on room air prior to admission at PCP office in the low 80's.  02 sat in ED on room air in the low 80's.  Patient placed on 02 3 liters nasal cannula with 02 sats improved to the 90's.  Known history of COPD requiring medications prior to admission  No history of requiring home 02.  Please see nursing assessment Rickard Rhymes, RN) of 02 requirements dated 08/14/14:  Patient Saturations on Room Air at Rest = 83%  Patient Saturations on [employer] while Ambulating = 80%  Patient Saturations on 2 Liters of oxygen while Ambulating = 88%    Possible Clinical Conditions:   - Acute on Chronic Respiratory Failure   - Other Condition   - Unable to Clinically Determine   Thank You,  Vilinda Flake RN BSN [facility]S [place] Documentation Improvement

## 2014-08-14 NOTE — Consult Note (Signed)
Referring Provider:Mark Marlou Porch, MD Primary Care Physician:  Nyoka Cowden, MD   Reason for Consultation:  Assume attending role  HPI: [name] is a 73 y.o. female with a PMH of CAD, COPD, Afib, and presumed GI bleed, blood loss anemia. Patient's PCP advised patient to come to ED 9/14 due to hypoxia in the 80's and required 3L of oxygen at that time. She was admitted to cardiology for CHF and Afib.  Cardiology feels that she is stable from their point of view at this time and request that [facility] assume care. She was noted to have a drop in hemoglobin.  Xarelto was discontinued.  Of note the patient was discharged from [facility] on 9/9 after suspected GI bleed. She reported black, tarry stools prior to admission on 9/3; colonoscopy and endoscopy from that admission showed no definitive etiology of bleed. Patient was anemic on last admission and was transfused two units of PRBCs which improved her hgb from 5.4 to 8.6.  As of 9/16 her hgb is 7.2  She states she feels better today and breathing has improved significantly with oxygen therapy.    Past Medical History  Diagnosis Date  . ANEMIA 08/28/2010  . CAD (coronary artery disease)     a. s/p CABGx4 in 2001.  Marland Kitchen COLONIC POLYPS, HX OF 06/15/2007  . COPD (chronic obstructive pulmonary disease)   . Depressive disorder   . GERD 12/19/2009  . Hyperlipidemia   . Hypertension   . HYPOTENSION 08/11/2010  . Osteoarthritis     Lumbar stenosis  . PVD (peripheral vascular disease)     a. Duplex 03/2013: stable moderate carotid dz - 09-32% RICA, 35-57% LICA, L subclavian artery to CCA stent widely patent.  b. Prior R fempop bypass graft, R CIA stent.  . Lung abscess 2011  . CHF (congestive heart failure)   . Hx of colonoscopy   . Paroxysmal atrial flutter     a. Dx 01/2014, placed on Xarelto.  . Aortic stenosis     a. Mild by echo 01/2013.  . Diabetes mellitus without complication     Past Surgical History  Procedure Laterality Date  .  L subclavian bypass  07/2004  . Tubal ligation    . Coronary artery bypass graft  05/2000    LIMA-LAD, SVG-OM, SVG-PDA-PL  . Femoral popliteal bypass-r  04/1999  . Right common iliac pta with stent placement  07/2000  . Right leg blockage  2003  . Aorta bifemoral bypass graft  2004  . Left cea  2005  . Lung cyst biopsy  2011  . Esophagogastroduodenoscopy N/A 08/03/2014    Procedure: ESOPHAGOGASTRODUODENOSCOPY (EGD);  Surgeon: Juanita Craver, MD;  Location: [facility] ENDOSCOPY;  Service: Endoscopy;  Laterality: N/A;  . Colonoscopy Left 08/05/2014    Procedure: COLONOSCOPY;  Surgeon: Juanita Craver, MD;  Location: [place];  Service: Endoscopy;  Laterality: Left;  Freda Munro capsule study N/A 08/06/2014    Procedure: GIVENS CAPSULE STUDY;  Surgeon: Irene Shipper, MD;  Location: [place];  Service: Endoscopy;  Laterality: N/A;    Prior to Admission medications   Medication Sig Start Date End Date Taking? Authorizing Provider  albuterol (PROVENTIL HFA;VENTOLIN HFA) 108 (90 BASE) MCG/ACT inhaler Inhale 2 puffs into the lungs every 4 (four) hours as needed for wheezing or shortness of breath.   Yes Historical Provider, MD  amiodarone (PACERONE) 200 MG tablet Take 1 tablet (200 mg total) by mouth daily. 07/03/14  Yes Dorothy Spark, MD  atorvastatin (LIPITOR)  40 MG tablet Take 1 tablet (40 mg total) by mouth every evening. 05/23/14  Yes Dorothy Spark, MD  cilostazol (PLETAL) 100 MG tablet Take 100 mg by mouth 2 (two) times daily.   Yes Historical Provider, MD  diltiazem (CARDIZEM CD) 120 MG 24 hr capsule Take 1 capsule by mouth daily. 03/18/14  Yes Historical Provider, MD  fluconazole (DIFLUCAN) 100 MG tablet Take 1 tablet (100 mg total) by mouth daily. 08/07/14  Yes Samhita Kretsch Kristeen Mans, MD  furosemide (LASIX) 40 MG tablet Take 0.5 tablets (20 mg total) by mouth daily. 04/13/14  Yes Adeline C Viyuoh, MD  gabapentin (NEURONTIN) 600 MG tablet Take 600 mg by mouth daily as needed. For pain 07/23/13  Yes Marletta Lor, MD  glimepiride (AMARYL) 1 MG tablet Take 1 mg by mouth daily with breakfast.   Yes Historical Provider, MD  HYDROcodone-acetaminophen (NORCO/VICODIN) 5-325 MG per tablet Take 0.5 tablets by mouth every 6 (six) hours as needed for moderate pain. 03/25/14  Yes Marletta Lor, MD  LORazepam (ATIVAN) 0.5 MG tablet Take 0.5 mg by mouth 2 (two) times daily.   Yes Historical Provider, MD  metFORMIN (GLUCOPHAGE) 500 MG tablet Take 1 tablet (500 mg total) by mouth 2 (two) times daily with a meal. 04/26/14  Yes Marletta Lor, MD  metoprolol (LOPRESSOR) 50 MG tablet Take 25 mg by mouth 2 (two) times daily.  04/13/14  Yes Adeline Saralyn Pilar, MD  Multiple Vitamins-Minerals (CENTRUM SILVER ULTRA WOMENS PO) Take 1 capsule by mouth daily.    Yes Historical Provider, MD  nitroGLYCERIN (NITROSTAT) 0.4 MG SL tablet Place 0.4 mg under the tongue every 5 (five) minutes as needed for chest pain.   Yes Historical Provider, MD  potassium chloride (K-DUR) 10 MEQ tablet Take 1 tablet (10 mEq total) by mouth daily. 03/12/14  Yes Peter M Martinique, MD  promethazine (PHENERGAN) 25 MG tablet Take 1 tablet (25 mg total) by mouth every 6 (six) hours as needed for nausea or vomiting. 03/25/14  Yes Marletta Lor, MD  sertraline (ZOLOFT) 50 MG tablet Take 25 mg by mouth daily.    Yes Historical Provider, MD  tiotropium (SPIRIVA) 18 MCG inhalation capsule Place 1 capsule (18 mcg total) into inhaler and inhale daily. 08/08/14  Yes Elsie Stain, MD    Current Facility-Administered Medications  Medication Dose Route Frequency Provider Last Rate Last Dose  . 0.9 %  sodium chloride infusion  250 mL Intravenous PRN Cletus Gash, MD      . acetaminophen (TYLENOL) tablet 650 mg  650 mg Oral Q4H PRN Cletus Gash, MD      . albuterol (PROVENTIL) (2.5 MG/3ML) 0.083% nebulizer solution 3 mL  3 mL Inhalation Q4H PRN Cletus Gash, MD      . amiodarone (PACERONE) tablet 200 mg  200 mg Oral Daily Cletus Gash, MD   200 mg at 08/14/14 1024  . antiseptic oral rinse (CPC / CETYLPYRIDINIUM CHLORIDE 0.05%) solution 7 mL  7 mL Mouth Rinse BID Dorothy Spark, MD   7 mL at 08/14/14 1000  . aspirin EC tablet 81 mg  81 mg Oral Daily Cletus Gash, MD   81 mg at 08/14/14 1024  . diltiazem (CARDIZEM CD) 24 hr capsule 120 mg  120 mg Oral Daily Cletus Gash, MD   120 mg at 08/14/14 1024  . enoxaparin (LOVENOX) injection 30 mg  30 mg Subcutaneous Q24H Cletus Gash, MD   30 mg at 08/14/14 1024  .  furosemide (LASIX) injection 20 mg  20 mg Intravenous Once Melton Alar, PA-C      . furosemide (LASIX) tablet 40 mg  40 mg Oral Daily Candee Furbish, MD   40 mg at 08/14/14 1056  . gabapentin (NEURONTIN) tablet 600 mg  600 mg Oral TID Cletus Gash, MD   600 mg at 08/14/14 1502  . HYDROcodone-acetaminophen (NORCO/VICODIN) 5-325 MG per tablet 0.5 tablet  0.5 tablet Oral Q6H PRN Cletus Gash, MD      . insulin aspart (novoLOG) injection 0-15 Units  0-15 Units Subcutaneous TID WC Cletus Gash, MD   2 Units at 08/13/14 1227  . LORazepam (ATIVAN) tablet 0.5 mg  0.5 mg Oral BID PRN Cletus Gash, MD      . metoprolol tartrate (LOPRESSOR) tablet 25 mg  25 mg Oral BID Cletus Gash, MD   25 mg at 08/14/14 1024  . nitroGLYCERIN (NITROSTAT) SL tablet 0.4 mg  0.4 mg Sublingual Q5 min PRN Cletus Gash, MD      . ondansetron [facility]) injection 4 mg  4 mg Intravenous Q6H PRN Cletus Gash, MD      . potassium chloride (K-DUR) CR tablet 10 mEq  10 mEq Oral Daily Cletus Gash, MD   10 mEq at 08/14/14 1023  . sertraline (ZOLOFT) tablet 25 mg  25 mg Oral Daily Cletus Gash, MD   25 mg at 08/14/14 1023  . sodium chloride 0.9 % injection 3 mL  3 mL Intravenous Q12H Cletus Gash, MD   3 mL at 08/14/14 1000  . sodium chloride 0.9 % injection 3 mL  3 mL Intravenous PRN Cletus Gash, MD      . tiotropium [facility]) inhalation capsule 18 mcg  18 mcg Inhalation Daily Cletus Gash, MD    18 mcg at 08/14/14 1009    Allergies as of 08/12/2014 - Review Complete 08/12/2014  Allergen Reaction Noted  . Codeine phosphate  01/25/2007    Family History  Problem Relation Age of Onset  . Diabetes Father   . Heart attack Mother   . Diabetes    . Coronary artery disease    . Colon cancer Neg Hx     History   Social History  . Marital Status: Widowed    Spouse Name: N/A    Number of Children: 2  . Years of Education: N/A   Occupational History  . Receptionist    Social History Main Topics  . Smoking status: Former Smoker -- 1.50 packs/day for 40 years    Types: Cigarettes    Quit date: 06/29/2000  . Smokeless tobacco: Never Used  . Alcohol Use: No  . Drug Use: No  . Sexual Activity: Not on file   Other Topics Concern  . Not on file   Social History Narrative  . No narrative on file    Review of Systems: Gen: Denies any fever, chills CV: Denies chest pain, angina, palpitations Resp: Denies cough, sputum, wheezing, coughing up blood, and pleurisy. GI: Denies vomiting blood, jaundice, and fecal incontinence.   Denies dysphagia or odynophagia.  Reports GERD and Black stool from approximately 1 month prior to 9/3. GU : Denies urinary burning, blood in urine, urinary frequency, urinary hesitancy, nocturnal urination, and urinary incontinence.   Physical Exam: Vital signs in last 24 hours: Temp:  [97.4 F (36.3 C)-98.5 F (36.9 C)] 98.3 F (36.8 C) (09/16 1552) Pulse Rate:  [70-79] 72 (09/16 1552) Resp:  [20] 20 (09/16 1300) BP: (91-117)/(29-49) 94/34 mmHg (09/16 1552) SpO2:  [  83 %-96 %] 96 % (09/16 1300) Weight:  [68.947 kg (152 lb)] 68.947 kg (152 lb) (09/16 0405) Last BM Date: 08/12/14  General:   Elderly female, Alert,  Well-developed, well-nourished, pleasant and cooperative in NAD HEENT: MMM, mouth and throat without erythema or exudate Lungs:  Clear throughout to auscultation.   No wheezes, crackles, or rhonchi.  Heart:  Regular rate and rhythm;  no murmurs, clicks, rubs,  or gallops. Abdomen:  Soft, slightly tender to deep palpation only left of umbilicus, nondistended. No masses, hepatosplenomegaly or hernias noted. Normal bowel sounds, no guarding, no rebound.   Extremities:  Without edema. Neurologic:  Alert and oriented x4;  grossly normal neurologically. Skin:  5-6, 0.5-2.0 cm annular lesions noted on right lower extremity, 2-3, 0.5 cm same lesions of left lower extremity Psych:  Alert and cooperative. Normal mood and affect.   Lab Results:  Recent Labs  08/12/14 1740 08/14/14 0309 08/14/14 1158  WBC 9.9 6.7 6.9  HGB 9.2* 7.2* 7.9*  HCT 30.2* 23.3* 25.6*  PLT 128* 287 332   BMET  Recent Labs  08/12/14 1825 08/13/14 0352 08/14/14 0309  NA 137 139 142  K 4.6 4.0 3.8  CL 98 99 102  CO2 19 24 26   GLUCOSE 126* 109* 66*  BUN 24* 24* 26*  CREATININE 1.23* 1.27* 1.33*  CALCIUM 9.7 9.1 8.3*   LFT  Recent Labs  08/13/14 0352 08/14/14 0309  PROT 6.4 5.6*  ALBUMIN 2.5* 2.2*  AST 70* 42*  ALT 120* 78*  ALKPHOS 115 103  BILITOT 0.7 0.6  BILIDIR 0.3  --   IBILI 0.4  --    PT/INR  Recent Labs  08/12/14 1825  LABPROT 16.3*  INR 1.31    Studies/Results: Dg Chest 2 View  08/12/2014   CLINICAL DATA:  Recently discharged. Anemic and shortness of breath. Low oxygen levels. High blood pressure. History of diabetes, aortic stenosis, anemia, CAD, COPD, GERD, PVD, CHF.  EXAM: CHEST  2 VIEW  COMPARISON:  08/02/2014 and multiple prior studies  FINDINGS: Patient has had median sternotomy and CABG. The heart is enlarged. There are Kerley B-lines Richard asymmetric, left greater than right. Findings favor pulmonary edema. There is a some weight patchy distribution of densities. Findings raise impressions superimposed infectious infiltrate. Possible nodule is identified at the right lung base measuring 1.6 cm and followup is recommended. There is dense atherosclerotic calcification of the thoracic aorta.  IMPRESSION: 1.  Cardiomegaly and interstitial changes consistent with pulmonary edema. 2. Patchy appearance of the lung parenchyma bilaterally consistent with superimposed infectious process. Alternatively the findings may be related to asymmetric pulmonary edema. 3. Possible right lower lobe lung nodule warrants followup. Follow-up chest x-ray to document clearing is recommended. If this density fails to clear, followup chest CT would be recommended.   Electronically Signed   By: Shon Hale M.D.   On: 08/12/2014 19:04    Impression/Plan:  GI Bleed/ Blood Loss Anemia - hemoglobin currently 7.2 - Xarelto and pletal discontinued.  On low dose aspirin will cautiously continue for now. - 2 units PRBCs ordered to be transfused 9/16 - [facility] consulted.  Colonoscopy and endoscopy revealed no clear etiology of bleed.  Results of capsule endo are pending.  Paroxysmal atrial fibrillation  - Currently sinus rhythm on amiodarone, low-dose, cardizem, aspirin.  Xarelto and pletal discontinued. - Maintaining sinus rhythm. Rate controlled.  Acute on chronic diastolic heart failure (EF 50-55%)  - Diuresised 9/15  - Change to PO Lasix, 40  mg once a day  - BNP 8000 on admission. Mildly elevated troponin, demand ischemia.  - Continue to monitor pressure   Demand ischemia - Continued mildly elevated troponin.  - No cardiac catheterization, no symptoms of unstable angina.  - Low-dose aspirin. Continue with beta blocker.   COPD  - Stable. - Continue with oxygen therapy.  Will check ambulating oxygen sats after blood transfusion. - Continue COPD regimen.   Diabetes - Hold metformin and amaryl -Continue sliding scale.   Abnormal Urinalysis -appears not to be a clean catch. -patient asymptomatic.  Will not reorder u/a unless symptomatic.  Elevated LFTs  - Fluconazole discontinued. - Amiodarone continued, patient currently in sinus rhythm. Pharmacist felt that amiodarone dose was low and therefore likely not  culprit for elevated enzymes.  - Statin on hold. If LFTs remain an issue with statins, possibly consider PCSK 9.  - ALT improving. Currently 76.    Hypoalbuminemia -nutrition consult.  Coronary artery disease  - Bypass 2001, stable. Elevated troponin secondary to demand ischemia.  - continue aspirin 81 mg.  Peripheral vascular disease  - Aortobifemoral in 2005, stable.  - Pletal discontinued - Continue aspirin      LOS: 2 days   Waylan Rocher  08/14/2014, 3:54 PM Imogene Burn, PA-C [facility] Pager: [phone]  AttendingPatient was seen, examined,treatment plan was discussed with the Physician extender. I have directly reviewed the clinical findings, lab, imaging studies and management of this patient in detail. I have made the necessary changes to the above noted documentation, and agree with the documentation, as recorded by the Physician extender.  Nena Alexander MD [facility].

## 2014-08-14 NOTE — Progress Notes (Signed)
Subjective:  73 year old female with coronary artery disease status post bypass in 2001, paroxysmal atrial fibrillation, diastolic heart failure chronic, hypertension, COPD discharge on 9/9 secondary to GI bleed and troponin elevation sent from PCP hypoxic in the 80s to the emergency room requiring 3 L of oxygen.  Walked hallway without difficulty, she is still requiring oxygen. She may require this at home. -3.8 L out.  Objective:  Vital Signs in the last 24 hours: Temp:  [98 F (36.7 C)-98.5 F (36.9 C)] 98 F (36.7 C) (09/16 0405) Pulse Rate:  [73-79] 79 (09/16 0849) Resp:  [18-20] 20 (09/16 0405) BP: (91-117)/(41-48) 91/43 mmHg (09/16 0849) SpO2:  [83 %-100 %] 90 % (09/16 0405) Weight:  [152 lb (68.947 kg)] 152 lb (68.947 kg) (09/16 0405)  Intake/Output from previous day: 09/15 0701 - 09/16 0700 In: 480 [P.O.:480] Out: 3200 [Urine:3200]   Physical Exam: General: Well developed, well nourished, in no acute distress. Head:  Normocephalic and atraumatic. No significant JVD Lungs: Clear to auscultation and percussion. Heart: Normal S1 and S2.  1/6 systolic right upper sternal border murmur, rubs or gallops.  Abdomen: soft, non-tender, positive bowel sounds. Extremities: No clubbing or cyanosis. No edema. Neurologic: Alert and oriented x 3.    Lab Results:  Recent Labs  08/12/14 1740 08/14/14 0309  WBC 9.9 6.7  HGB 9.2* 7.2*  PLT 128* 287    Recent Labs  08/13/14 0352 08/14/14 0309  NA 139 142  K 4.0 3.8  CL 99 102  CO2 24 26  GLUCOSE 109* 66*  BUN 24* 26*  CREATININE 1.27* 1.33*    Recent Labs  08/12/14 1825 08/13/14 0352  TROPONINI 0.55* 0.40*   Hepatic Function Panel  Recent Labs  08/13/14 0352 08/14/14 0309  PROT 6.4 5.6*  ALBUMIN 2.5* 2.2*  AST 70* 42*  ALT 120* 78*  ALKPHOS 115 103  BILITOT 0.7 0.6  BILIDIR 0.3  --   IBILI 0.4  --     Imaging: Dg Chest 2 View  08/12/2014   CLINICAL DATA:  Recently discharged. Anemic and  shortness of breath. Low oxygen levels. High blood pressure. History of diabetes, aortic stenosis, anemia, CAD, COPD, GERD, PVD, CHF.  EXAM: CHEST  2 VIEW  COMPARISON:  08/02/2014 and multiple prior studies  FINDINGS: Patient has had median sternotomy and CABG. The heart is enlarged. There are Kerley B-lines Richard asymmetric, left greater than right. Findings favor pulmonary edema. There is a some weight patchy distribution of densities. Findings raise impressions superimposed infectious infiltrate. Possible nodule is identified at the right lung base measuring 1.6 cm and followup is recommended. There is dense atherosclerotic calcification of the thoracic aorta.  IMPRESSION: 1. Cardiomegaly and interstitial changes consistent with pulmonary edema. 2. Patchy appearance of the lung parenchyma bilaterally consistent with superimposed infectious process. Alternatively the findings may be related to asymmetric pulmonary edema. 3. Possible right lower lobe lung nodule warrants followup. Follow-up chest x-ray to document clearing is recommended. If this density fails to clear, followup chest CT would be recommended.   Electronically Signed   By: Shon Hale M.D.   On: 08/12/2014 19:04   Personally viewed.   Telemetry: Normal rhythm  Personally viewed.   EKG:  Sinus rhythm, poor R wave progression, nonspecific ST-T wave changes, baseline wander  Cardiac Studies:  Normal ejection fraction, mild pulmonary hypertension, dilated left atrium  Assessment/Plan:   1. Acute on chronic diastolic heart failure (EF 50-55%)  - Feels better after diuresis last  night. Change to by mouth Lasix, 40 mg once a day  - BNP 8000 on admission. Mildly elevated troponin, demand ischemia.  - Continue to monitor pressure  2. Demand ischemia-  - Continued mildly elevated troponin.  - No cardiac catheterization, no symptoms of unstable angina.  - Low-dose aspirin. We may need to stop with recent decrease again in hemoglobin.  Continue with beta blocker.  3. COPD   - Chronic,playing a role in her dyspnea.  - Continue with oxygen therapy for now. Continue COPD regimen.  4. Diabetes-holding metformin through hospitalization. Continue with glimiperide and sliding scale.  5. Urinalysis-abnormal. Awaiting cultures to determine treatment.  6. Elevated LFTs  - Currently on fluconazole day 6 of 10 started for candidiasis on EGD. I will discontinue fluconazole at this point secondary to elevated liver enzymes. Please see pharmacy note which suggests discontinuation as well.  - Amiodarone, currently in sinus rhythm. Pharmacist felt that amiodarone dose was low and therefore likely not culprit for elevated enzymes.  - Statin on hold. If LFTs remain an issue with statins, could consider PCSK 9.  - ALT is improving. Currently 96.  - ? Albumin very low. Could this be acute phase reactant, nutritional?    7. Paroxysmal atrial fibrillation  - Currently sinus rhythm on amiodarone, low-dose  - Maintaining sinus rhythm. Cardizem CD 120 as well.  8. Coronary artery disease  - Bypass 2001, stable. Elevated troponin secondary to demand ischemia.  9. Peripheral vascular disease  - Aortobifemoral in 2005, stable. Pletal - I will stop given her history of diastolic heart failure and potential bleeding. Continue aspirin if possible.  10. Anemia-hemoglobin currently 7.2. I will repeat. She is denying any stools since being in the hospital. She was previously worked up by gastroenterology during last hospitalization. She has required blood transfusion recently. She is off of her Xarelto for atrial fibrillation. I will call [facility]. She was just recently discharged from their service. Given the stability of cardiac issues, I believe we will be optimal for her to be on their team. Capsule endoscopy study performed.  I spoke to Dr. Sloan Leiter who graciously accept her onto his service as he knows her well.  Almas Rake,  [place] 08/14/2014, 10:18 AM

## 2014-08-14 NOTE — Consult Note (Signed)
[facility] Consult: 10:58 AM 08/14/2014  LOS: 2 days    Referring Provider: Dr Sloan Leiter  Primary Care Physician:  Nyoka Cowden, MD Primary Gastroenterologist:  Dr. Fuller Plan.       Reason for Consultation:  Recurrent anemia.  Recently had capsule endoscopy.    HPI: [name] is a 73 y.o. female.  [name] is a 73 y.o. female. PMH CAD, s/p CABG, paroxysmal afib previously on xarelto, COPD, hypertension, DM2 (oral agents), peripheral vascular disease, s/p LE revascularizatio surgeries/CEA, on Pletal. Non-hemorrhagic CVA in 03/2014. Hx anemia. EGD and colonoscopy in 2011: colon polyps and HH.   Consulted during admission 12 days ago for melena and anemia. Transfused 2 units for Hgb of 5.4, c/w baseling of 9 to 11.  Was coagulopathic at 39/4 on Xarelto.  Was also taking Mobic and no PPI/H2B.   Dr Collene Mares did EGD and colon on 9/5 and these showed candida esophagitis and hyperplastic polyps and sigmoid tics.   Pt completed capsule endoscopy 9/8 - 9/9. Went home on po Diflucan but Mobic, losartan, and Xarelto discontinued.  Hgb was 8.6.  Capsule endoscopy will be read this afternoon.   Readmitted 2 days ago with resp decompensation, hypoxia in 80s. She was not getting oxygen at discharge, but had been using this at home prior to the early Sept admission. She says she was not SOB at home, her problem was weakness leading to falls at home.  EKG with TWI, improved c/w 9/3. BNP 8000.  Diagnosis is acute on chronic diastolic heart failure, demand ischemia, COPD. LFTs are elevated so cardiology discontinued Diflucan and do not feel low dose Amiodarone is contributing.  They are normalizing.  LFTs were normal on 9/3.  Cardiology may hold statin if LFTs stay elevated.   At start of current admission Hgb was  9.2.  Dropped to 7.2 today, 2 days later. FOBT negative on 9/14, positive on 9/3.  Since discharge all stools have been brown, no nausea     ENDOSCOPIC STUDIES: Capsule endoscopy 9/8 - 9/9 To be read 9/16  08/13/14  EGD  Dr Collene Mares IMPRESSION: Whitish plaques in the mid-esophagus-consistent with  esophageal candiasis-brushings done; a small hiatal hernia noted on  retroflexion; otherwise, normal esophagogastroduodenoscopy. Brushing cytology: BRUSHING, ESOPHAGEAL (SPECIMEN 1 OF 1 COLLECTED 08/03/2014): FUNGAL ORGANISMS PRESENT CONSISTENT WITH CANDIDA SPECIES. NO MALIGNANT CELLS IDENTIFIED.  08/13/14 Colonoscopy  Dr Collene Mares IMPRESSION: 1. Mild diverticulosis was noted in the sigmoid colon  2. Single dimunitive polyp was found in the rectosigmoid colon-removed by cold biopsies x 2.  3. There was a large amount of residual stool in the colon-small lesions could be missed. Pathology: Colon, biopsy, Sigmoid - HYPERPLASTIC POLYP, 2 FRAGMENTS. - NO DYSPLASIA OR MALIGNANCY IDENTIFIED.  EGD and colonoscopy in 2011: colon polyps and HH.    Past Medical History  Diagnosis Date  . ANEMIA 08/28/2010  . CAD (coronary artery disease)     a. s/p CABGx4 in 2001.  Marland Kitchen COLONIC POLYPS, HX OF 06/15/2007  . COPD (chronic obstructive pulmonary disease)   . Depressive  disorder   . GERD 12/19/2009  . Hyperlipidemia   . Hypertension   . HYPOTENSION 08/11/2010  . Osteoarthritis     Lumbar stenosis  . PVD (peripheral vascular disease)     a. Duplex 03/2013: stable moderate carotid dz - 78-46% RICA, 96-29% LICA, L subclavian artery to CCA stent widely patent.  b. Prior R fempop bypass graft, R CIA stent.  . Lung abscess 2011  . CHF (congestive heart failure)   . Hx of colonoscopy   . Paroxysmal atrial flutter     a. Dx 01/2014, placed on Xarelto.  . Aortic stenosis     a. Mild by echo 01/2013.  . Diabetes mellitus without complication     Past Surgical History  Procedure Laterality Date  . L subclavian bypass   07/2004  . Tubal ligation    . Coronary artery bypass graft  05/2000    LIMA-LAD, SVG-OM, SVG-PDA-PL  . Femoral popliteal bypass-r  04/1999  . Right common iliac pta with stent placement  07/2000  . Right leg blockage  2003  . Aorta bifemoral bypass graft  2004  . Left cea  2005  . Lung cyst biopsy  2011  . Esophagogastroduodenoscopy N/A 08/03/2014    Procedure: ESOPHAGOGASTRODUODENOSCOPY (EGD);  Surgeon: Juanita Craver, MD;  Location[facility] ENDOSCOPY;  Service: Endoscopy;  Laterality: N/A;  . Colonoscopy Left 08/05/2014    Procedure: COLONOSCOPY;  Surgeon: Juanita Craver, MD;  Location: [facility];  Service: Endoscopy;  Laterality: Left;  Freda Munro capsule study N/A 08/06/2014    Procedure: GIVENS CAPSULE STUDY;  Surgeon: Irene Shipper, MD;  Location: [facility];  Service: Endoscopy;  Laterality: N/A;    Prior to Admission medications   Medication Sig Start Date End Date Taking? Authorizing Provider  albuterol (PROVENTIL HFA;VENTOLIN HFA) 108 (90 BASE) MCG/ACT inhaler Inhale 2 puffs into the lungs every 4 (four) hours as needed for wheezing or shortness of breath.   Yes Historical Provider, MD  amiodarone (PACERONE) 200 MG tablet Take 1 tablet (200 mg total) by mouth daily. 07/03/14  Yes Dorothy Spark, MD  atorvastatin (LIPITOR) 40 MG tablet Take 1 tablet (40 mg total) by mouth every evening. 05/23/14  Yes Dorothy Spark, MD  cilostazol (PLETAL) 100 MG tablet Take 100 mg by mouth 2 (two) times daily.   Yes Historical Provider, MD  diltiazem (CARDIZEM CD) 120 MG 24 hr capsule Take 1 capsule by mouth daily. 03/18/14  Yes Historical Provider, MD  fluconazole (DIFLUCAN) 100 MG tablet Take 1 tablet (100 mg total) by mouth daily. 08/07/14  Yes Shanker Kristeen Mans, MD  furosemide (LASIX) 40 MG tablet Take 0.5 tablets (20 mg total) by mouth daily. 04/13/14  Yes Adeline C Viyuoh, MD  gabapentin (NEURONTIN) 600 MG tablet Take 600 mg by mouth daily as needed. For pain 07/23/13  Yes Marletta Lor, MD  glimepiride  (AMARYL) 1 MG tablet Take 1 mg by mouth daily with breakfast.   Yes Historical Provider, MD  HYDROcodone-acetaminophen (NORCO/VICODIN) 5-325 MG per tablet Take 0.5 tablets by mouth every 6 (six) hours as needed for moderate pain. 03/25/14  Yes Marletta Lor, MD  LORazepam (ATIVAN) 0.5 MG tablet Take 0.5 mg by mouth 2 (two) times daily.   Yes Historical Provider, MD  metFORMIN (GLUCOPHAGE) 500 MG tablet Take 1 tablet (500 mg total) by mouth 2 (two) times daily with a meal. 04/26/14  Yes Marletta Lor, MD  metoprolol (LOPRESSOR) 50 MG tablet Take 25 mg by mouth  2 (two) times daily.  04/13/14  Yes Adeline Saralyn Pilar, MD  Multiple Vitamins-Minerals (CENTRUM SILVER ULTRA WOMENS PO) Take 1 capsule by mouth daily.    Yes Historical Provider, MD  nitroGLYCERIN (NITROSTAT) 0.4 MG SL tablet Place 0.4 mg under the tongue every 5 (five) minutes as needed for chest pain.   Yes Historical Provider, MD  potassium chloride (K-DUR) 10 MEQ tablet Take 1 tablet (10 mEq total) by mouth daily. 03/12/14  Yes Peter M Martinique, MD  promethazine (PHENERGAN) 25 MG tablet Take 1 tablet (25 mg total) by mouth every 6 (six) hours as needed for nausea or vomiting. 03/25/14  Yes Marletta Lor, MD  sertraline (ZOLOFT) 50 MG tablet Take 25 mg by mouth daily.    Yes Historical Provider, MD  tiotropium (SPIRIVA) 18 MCG inhalation capsule Place 1 capsule (18 mcg total) into inhaler and inhale daily. 08/08/14  Yes Elsie Stain, MD    Scheduled Meds: . amiodarone  200 mg Oral Daily  . antiseptic oral rinse  7 mL Mouth Rinse BID  . aspirin EC  81 mg Oral Daily  . diltiazem  120 mg Oral Daily  . enoxaparin (LOVENOX) injection  30 mg Subcutaneous Q24H  . furosemide  40 mg Oral Daily  . gabapentin  600 mg Oral TID  . glimepiride  1 mg Oral Q breakfast  . insulin aspart  0-15 Units Subcutaneous TID WC  . metoprolol  25 mg Oral BID  . potassium chloride  10 mEq Oral Daily  . sertraline  25 mg Oral Daily  . sodium chloride   3 mL Intravenous Q12H  . tiotropium  18 mcg Inhalation Daily   Infusions:   PRN Meds: sodium chloride, acetaminophen, albuterol, HYDROcodone-acetaminophen, LORazepam, nitroGLYCERIN, ondansetron (ZOFRAN) IV, sodium chloride   Allergies as of 08/12/2014 - Review Complete 08/12/2014  Allergen Reaction Noted  . Codeine phosphate  01/25/2007    Family History  Problem Relation Age of Onset  . Diabetes Father   . Heart attack Mother   . Diabetes    . Coronary artery disease    . Colon cancer Neg Hx     History   Social History  . Marital Status: Widowed    Spouse Name: N/A    Number of Children: 2  . Years of Education: N/A   Occupational History  . Receptionist    Social History Main Topics  . Smoking status: Former Smoker -- 1.50 packs/day for 40 years    Types: Cigarettes    Quit date: 06/29/2000  . Smokeless tobacco: Never Used  . Alcohol Use: No  . Drug Use: No   REVIEW OF SYSTEMS: Constitutional:  Weight stable.  Weak at home with falls ENT:  No nose bleeds Pulm:  No dyspnea, no cough CV:  No palpitations, no LE edema.  GU:  No hematuria, no frequency GI:  Per HPI Heme:  Per HPI   Transfusions:  Per HPI Neuro:  No headaches, no peripheral tingling or numbness Derm:  No itching, no rash or sores.  Endocrine:  No sweats or chills.  No polyuria or dysuria Immunization:  Not queried Travel:  None beyond local counties in last few months.    PHYSICAL EXAM: Vital signs in last 24 hours: Filed Vitals:   08/14/14 0849  BP: 91/43  Pulse: 79  Temp:   Resp:    Wt Readings from Last 3 Encounters:  08/14/14 68.947 kg (152 lb)  08/06/14 72.167 kg (159 lb 1.6 oz)  08/06/14 72.167 kg (159 lb 1.6 oz)    General: pleasant, looks well except a bit pale Head:  No swelling or trauma  Eyes:  No icterus.  Conj pale Ears:  Not HOH  Nose:  No discharge Mouth:  Clear and moist.  No yeast. Neck:  No JVD or mass Lungs:  Clear bil.   Heart: 1/6 SEM.  Irregular,  irregular Abdomen:  Soft, NT, ND.  No mass or HSM.  No bruits.   Musc/Skeltl: no joint swelling or deformity Extremities:  No CCE  Neurologic:  No tremor.  Oriented x 3.  No limb weakness  Skin:  Tinea looking annular lesion on right shin.  No telangectasia.  Tattoos:  none Nodes:  No cervical adenopathy   Psych:  Pleasant, cooperative, relaxed.    LAB RESULTS:  Recent Labs  08/12/14 1740 08/14/14 0309  WBC 9.9 6.7  HGB 9.2* 7.2*  HCT 30.2* 23.3*  PLT 128* 287   BMET Lab Results  Component Value Date   NA 142 08/14/2014   NA 139 08/13/2014   NA 137 08/12/2014   K 3.8 08/14/2014   K 4.0 08/13/2014   K 4.6 08/12/2014   CL 102 08/14/2014   CL 99 08/13/2014   CL 98 08/12/2014   CO2 26 08/14/2014   CO2 24 08/13/2014   CO2 19 08/12/2014   GLUCOSE 66* 08/14/2014   GLUCOSE 109* 08/13/2014   GLUCOSE 126* 08/12/2014   BUN 26* 08/14/2014   BUN 24* 08/13/2014   BUN 24* 08/12/2014   CREATININE 1.33* 08/14/2014   CREATININE 1.27* 08/13/2014   CREATININE 1.23* 08/12/2014   CALCIUM 8.3* 08/14/2014   CALCIUM 9.1 08/13/2014   CALCIUM 9.7 08/12/2014   LFT  Recent Labs  08/12/14 1825 08/13/14 0352 08/14/14 0309  PROT 7.5 6.4 5.6*  ALBUMIN 2.9* 2.5* 2.2*  AST 107* 70* 42*  ALT 156* 120* 78*  ALKPHOS 141* 115 103  BILITOT 0.9 0.7 0.6  BILIDIR  --  0.3  --   IBILI  --  0.4  --    PT/INR Lab Results  Component Value Date   INR 1.31 08/12/2014   INR 1.39 08/03/2014   INR 4.08* 08/01/2014    RADIOLOGY STUDIES: Dg Chest 2 View  08/12/2014   CLINICAL DATA:  Recently discharged. Anemic and shortness of breath. Low oxygen levels. High blood pressure. History of diabetes, aortic stenosis, anemia, CAD, COPD, GERD, PVD, CHF.  EXAM: CHEST  2 VIEW  COMPARISON:  08/02/2014 and multiple prior studies  FINDINGS: Patient has had median sternotomy and CABG. The heart is enlarged. There are Kerley B-lines Richard asymmetric, left greater than right. Findings favor pulmonary edema. There is a some weight patchy  distribution of densities. Findings raise impressions superimposed infectious infiltrate. Possible nodule is identified at the right lung base measuring 1.6 cm and followup is recommended. There is dense atherosclerotic calcification of the thoracic aorta.  IMPRESSION: 1. Cardiomegaly and interstitial changes consistent with pulmonary edema. 2. Patchy appearance of the lung parenchyma bilaterally consistent with superimposed infectious process. Alternatively the findings may be related to asymmetric pulmonary edema. 3. Possible right lower lobe lung nodule warrants followup. Follow-up chest x-ray to document clearing is recommended. If this density fails to clear, followup chest CT would be recommended.   Electronically Signed   By: Shon Hale M.D.   On: 08/12/2014 19:04      IMPRESSION:   *  Recurrent anemia.  No evidence of recurrent melena or GI bleeding.  Remains off Eliquis.  2 units PRBC ordered to transfuse today. .   *  Candida esophagitis on 9/8 EGD.  Completed ~ 6 days of Diflucan.   *  HP polyps on 9/8 EGD.  *  Diastolic heart failure. Demand ischemia with elevated Troponins.   *  A fib.  Plan is to use low dose ASA.   *  Elevated LFTs.  Suspect shock liver from her heart failure. Other possibilities are meds,so Diflucan discontinued early.  LFTs improving.     PLAN:     *  Capsule endoscopy to be read today.    Azucena Freed  08/14/2014, 10:58 AM Pager: [phone]  [facility] Attending  I have also seen and assessed the patient and agree with the above note.  Capsule endoscopy of small; bowel  9/8 was normal. I think she has had some decline of Hgb due to intercurrent illnesses and not recurrent bleeding. No further GI w/u - agree with transfusion and she should also receive iron.  Thanks - will be available if other ? But am signing off.  Gatha Mayer, MD, [facility]

## 2014-08-14 NOTE — Progress Notes (Signed)
SATURATION QUALIFICATIONS: (This note is used to comply with regulatory documentation for home oxygen)  Patient Saturations on Room Air at Rest = 83%  Patient Saturations on Room Air while Ambulating = 80%  Patient Saturations on 2 Liters of oxygen while Ambulating = 88%  Rickard Rhymes, RN

## 2014-08-14 NOTE — Progress Notes (Signed)
Heart Failure Navigator Consult Note  Presentation: [name] is a 73 y.o. female w/ PMHx significant for CAD s/p CABG 2001, PAF, HTN, HFpEF, COPD who presented to [facility] on 08/12/2014 with complaints of shortness of breath. She was just discharged from the hospital on 9/9 after hospitalization for GI bleed and troponin elevation. Her daughter reports that she required oxygen during the entire hospitalization and day of discharge, she was suddenly discharged without it. At home on day of discharge, she felt tired and weak and out of breath with very minimal activity. Over the next couple of days, she has deteriorated further. Some relief with inhalers for COPD. Perhaps some increase in LE edema. Unsure about weight gain. Sleeps on 2 pillows which is old. No palpitations.  Past Medical History  Diagnosis Date  . ANEMIA 08/28/2010  . CAD (coronary artery disease)     a. s/p CABGx4 in 2001.  Marland Kitchen COLONIC POLYPS, HX OF 06/15/2007  . COPD (chronic obstructive pulmonary disease)   . Depressive disorder   . GERD 12/19/2009  . Hyperlipidemia   . Hypertension   . HYPOTENSION 08/11/2010  . Osteoarthritis     Lumbar stenosis  . PVD (peripheral vascular disease)     a. Duplex 03/2013: stable moderate carotid dz - 20-25% RICA, 42-70% LICA, L subclavian artery to CCA stent widely patent.  b. Prior R fempop bypass graft, R CIA stent.  . Lung abscess 2011  . CHF (congestive heart failure)   . Hx of colonoscopy   . Paroxysmal atrial flutter     a. Dx 01/2014, placed on Xarelto.  . Aortic stenosis     a. Mild by echo 01/2013.  . Diabetes mellitus without complication     History   Social History  . Marital Status: Widowed    Spouse Name: N/A    Number of Children: 2  . Years of Education: N/A   Occupational History  . Receptionist    Social History Main Topics  . Smoking status: Former Smoker -- 1.50 packs/day for 40 years    Types: Cigarettes    Quit date: 06/29/2000  . Smokeless  tobacco: Never Used  . Alcohol Use: No  . Drug Use: No  . Sexual Activity: None   Other Topics Concern  . None   Social History Narrative  . None    ECHO:Study Conclusions--04/11/14 - Left ventricle: The cavity size was normal. Wall thickness was normal. Systolic function was normal. The estimated ejection fraction was in the range of 50% to 55%. Moderate hypokinesis of the mid-distalanteroseptal myocardium. Features are consistent with a pseudonormal left ventricular filling pattern, with concomitant abnormal relaxation and increased filling pressure (grade 2 diastolic dysfunction). - Aortic valve: Mildly to moderately calcified annulus. Mildly calcified leaflets. Valve area: 0.96cm^2(VTI). Valve area: 1cm^2 (Vmax). - Mitral valve: Mild regurgitation. - Left atrium: The atrium was severely dilated. - Right ventricle: The cavity size was mildly dilated. Systolic function was mildly reduced. - Pulmonary arteries: Systolic pressure was mildly increased. PA peak pressure: 28mm Hg (S).  ------------------------------------------------------------ Labs, prior tests, procedures, and surgery: Coronary artery bypass grafting.  Transthoracic echocardiography. M-mode, complete 2D, spectral Doppler, and color Doppler. Height: Height: 160cm. Height: 63in. Weight: Weight: 69.9kg. Weight: 153.7lb. Body mass index: BMI: 27.3kg/m^2. Body surface area: BSA: 1.44m^2. Blood pressure: 160/70. Patient status: Inpatient. Location: Bedside.    BNP    Component Value Date/Time   PROBNP 8774.0* 08/12/2014 1825    Education Assessment and Provision:  Detailed  education and instructions provided on heart failure disease management including the following:  Signs and symptoms of Heart Failure When to call the physician Importance of daily weights Low sodium diet Fluid restriction Medication management Anticipated future follow-up appointments  Patient education given on each of the  above topics.  Patient acknowledges understanding and acceptance of all instructions.  Ms. Debroux and I spoke at length regarding her HF.  She admits to "liking salt" and list some foods that are high in sodium that she eats regularly.  We discussed that she should adhere to a low sodium diet.  She says that she does weigh daily and was not taking Lasix prior to admission--however her PTA med list does day that she was on Lasix 20 mg daily.  She relates that she has never been told to call with weight gains.  She also admits that her daughter handles most of "these things" and I have asked that her daughter call me so that I can educate her as well.  She lives alone and has multiple family members that live locally.  Education Materials:  "Living Better With Heart Failure" Booklet, Daily Weight Tracker Tool   High Risk Criteria for Readmission and/or Poor Patient Outcomes:   EF <30%- no--50-55% Grade 2 dias dys  2 or more admissions in 6 months- Yes  Difficult social situation- No  Demonstrates medication noncompliance- No    Barriers of Care:  Knowledge, compliance  Discharge Planning:   Plans to discharge to home alone with family nearby.  Her daughter will need to be educated regarding HF recommendations.  She will need 7 day HF outpatient follow- up.

## 2014-08-15 DIAGNOSIS — R7981 Abnormal blood-gas level: Secondary | ICD-10-CM

## 2014-08-15 DIAGNOSIS — I10 Essential (primary) hypertension: Secondary | ICD-10-CM

## 2014-08-15 LAB — GLUCOSE, CAPILLARY
Glucose-Capillary: 94 mg/dL (ref 70–99)
Glucose-Capillary: 146 mg/dL — ABNORMAL HIGH (ref 70–99)
Glucose-Capillary: 106 mg/dL — ABNORMAL HIGH (ref 70–99)
Glucose-Capillary: 94 mg/dL (ref 70–99)

## 2014-08-15 LAB — CBC
HEMATOCRIT: 28.9 % — AB (ref 36.0–46.0)
Hemoglobin: 9.3 g/dL — ABNORMAL LOW (ref 12.0–15.0)
MCH: 25.7 pg — AB (ref 26.0–34.0)
MCHC: 32.2 g/dL (ref 30.0–36.0)
MCV: 79.8 fL (ref 78.0–100.0)
PLATELETS: 270 10*3/uL (ref 150–400)
RBC: 3.62 MIL/uL — ABNORMAL LOW (ref 3.87–5.11)
RDW: 17.2 % — AB (ref 11.5–15.5)
WBC: 6.4 10*3/uL (ref 4.0–10.5)

## 2014-08-15 LAB — TYPE AND SCREEN
ABO/RH(D): O POS
ANTIBODY SCREEN: NEGATIVE
UNIT DIVISION: 0
Unit division: 0

## 2014-08-15 LAB — COMPREHENSIVE METABOLIC PANEL
ALBUMIN: 2.1 g/dL — AB (ref 3.5–5.2)
ALT: 58 U/L — AB (ref 0–35)
AST: 36 U/L (ref 0–37)
Alkaline Phosphatase: 92 U/L (ref 39–117)
Anion gap: 17 — ABNORMAL HIGH (ref 5–15)
BUN: 19 mg/dL (ref 6–23)
CHLORIDE: 98 meq/L (ref 96–112)
CO2: 23 mEq/L (ref 19–32)
Calcium: 8.2 mg/dL — ABNORMAL LOW (ref 8.4–10.5)
Creatinine, Ser: 1.11 mg/dL — ABNORMAL HIGH (ref 0.50–1.10)
GFR calc Af Amer: 56 mL/min — ABNORMAL LOW (ref >90)
GFR, EST NON AFRICAN AMERICAN: 48 mL/min — AB (ref >90)
Glucose, Bld: 73 mg/dL (ref 70–99)
Potassium: 3.7 mEq/L (ref 3.7–5.3)
SODIUM: 138 meq/L (ref 137–147)
Total Bilirubin: 0.9 mg/dL (ref 0.3–1.2)
Total Protein: 5.7 g/dL — ABNORMAL LOW (ref 6.0–8.3)

## 2014-08-15 MED ORDER — GABAPENTIN 600 MG PO TABS
600.0000 mg | ORAL_TABLET | Freq: Two times a day (BID) | ORAL | Status: DC
  Administered 2014-08-15 – 2014-08-16 (×2): 600 mg via ORAL
  Filled 2014-08-15 (×3): qty 1

## 2014-08-15 MED ORDER — BOOST / RESOURCE BREEZE PO LIQD: 1.0000 | Freq: Every day | ORAL | Status: DC | PRN

## 2014-08-15 NOTE — Progress Notes (Signed)
SATURATION QUALIFICATIONS: (This note is used to comply with regulatory documentation for home oxygen)  Patient Saturations on Room Air at Rest = 83%  Patient Saturations on Room Air while Ambulating = 80%  Patient Saturations on 2 Liters of oxygen while Ambulating = 91% Rickard Rhymes, RN

## 2014-08-15 NOTE — Consult Note (Signed)
Referring Provider:Mark Marlou Porch, MD Primary Care Physician:  Nyoka Cowden, MD     HPI: [name] is a 73 y.o. female with a PMH of CAD, COPD, Afib, and presumed GI bleed, blood loss anemia. Patient's PCP advised patient to come to ED 9/14 due to hypoxia in the 80's and required 3L of oxygen at that time. She was admitted to cardiology for CHF and Afib.  Cardiology feels that she is stable from their point of view at this time and request that [facility] assume care. She was noted to have a drop in hemoglobin.  Xarelto was discontinued.  Of note the patient was discharged from [facility] on 9/9 after suspected GI bleed. She reported black, tarry stools prior to admission on 9/3; colonoscopy and endoscopy from that admission showed no definitive etiology of bleed. Patient was anemic on last admission and was transfused two units of PRBCs which improved her hgb from 5.4 to 8.6.  As of 9/16 her hgb is 7.2  She states she feels better today and breathing has improved significantly with oxygen therapy.    Past Medical History  Diagnosis Date  . ANEMIA 08/28/2010  . CAD (coronary artery disease)     a. s/p CABGx4 in 2001.  . Adenomatous colon polyp 2000  . COPD (chronic obstructive pulmonary disease)   . Depressive disorder   . GERD 12/19/2009  . Hyperlipidemia   . Hypertension   . HYPOTENSION 08/11/2010  . Osteoarthritis     Lumbar stenosis  . PVD (peripheral vascular disease)     a. Duplex 03/2013: stable moderate carotid dz - 46-27% RICA, 03-50% LICA, L subclavian artery to CCA stent widely patent.  b. Prior R fempop bypass graft, R CIA stent.  . Lung abscess 2011  . CHF (congestive heart failure)   . Diverticulosis   . Hiatal hernia     a. Dx 01/2014, placed on Xarelto.  . Aortic stenosis     a. Mild by echo 01/2013.  . Diabetes mellitus without complication     Past Surgical History  Procedure Laterality Date  . L subclavian bypass  07/2004  . Tubal ligation    . Coronary  artery bypass graft  05/2000    LIMA-LAD, SVG-OM, SVG-PDA-PL  . Femoral popliteal bypass-r  04/1999  . Right common iliac pta with stent placement  07/2000  . Right leg blockage  2003  . Aorta bifemoral bypass graft  2004  . Left cea  2005  . Lung cyst biopsy  2011  . Esophagogastroduodenoscopy N/A 08/03/2014    Procedure: ESOPHAGOGASTRODUODENOSCOPY (EGD);  Surgeon: Juanita Craver, MD;  Location: [facility] ENDOSCOPY;  Service: Endoscopy;  Laterality: N/A;  . Colonoscopy Left 08/05/2014    Procedure: COLONOSCOPY;  Surgeon: Juanita Craver, MD;  Location: [place];  Service: Endoscopy;  Laterality: Left;  Freda Munro capsule study N/A 08/06/2014    Procedure: GIVENS CAPSULE STUDY;  Surgeon: Irene Shipper, MD;  Location: [place];  Service: Endoscopy;  Laterality: N/A;    Prior to Admission medications   Medication Sig Start Date End Date Taking? Authorizing Provider  albuterol (PROVENTIL HFA;VENTOLIN HFA) 108 (90 BASE) MCG/ACT inhaler Inhale 2 puffs into the lungs every 4 (four) hours as needed for wheezing or shortness of breath.   Yes Historical Provider, MD  amiodarone (PACERONE) 200 MG tablet Take 1 tablet (200 mg total) by mouth daily. 07/03/14  Yes Dorothy Spark, MD  atorvastatin (LIPITOR) 40 MG tablet Take 1 tablet (40 mg total) by  mouth every evening. 05/23/14  Yes Dorothy Spark, MD  cilostazol (PLETAL) 100 MG tablet Take 100 mg by mouth 2 (two) times daily.   Yes Historical Provider, MD  diltiazem (CARDIZEM CD) 120 MG 24 hr capsule Take 1 capsule by mouth daily. 03/18/14  Yes Historical Provider, MD  fluconazole (DIFLUCAN) 100 MG tablet Take 1 tablet (100 mg total) by mouth daily. 08/07/14  Yes Shanker Kristeen Mans, MD  furosemide (LASIX) 40 MG tablet Take 0.5 tablets (20 mg total) by mouth daily. 04/13/14  Yes Adeline C Viyuoh, MD  gabapentin (NEURONTIN) 600 MG tablet Take 600 mg by mouth daily as needed. For pain 07/23/13  Yes Marletta Lor, MD  glimepiride (AMARYL) 1 MG tablet Take 1 mg by mouth  daily with breakfast.   Yes Historical Provider, MD  HYDROcodone-acetaminophen (NORCO/VICODIN) 5-325 MG per tablet Take 0.5 tablets by mouth every 6 (six) hours as needed for moderate pain. 03/25/14  Yes Marletta Lor, MD  LORazepam (ATIVAN) 0.5 MG tablet Take 0.5 mg by mouth 2 (two) times daily.   Yes Historical Provider, MD  metFORMIN (GLUCOPHAGE) 500 MG tablet Take 1 tablet (500 mg total) by mouth 2 (two) times daily with a meal. 04/26/14  Yes Marletta Lor, MD  metoprolol (LOPRESSOR) 50 MG tablet Take 25 mg by mouth 2 (two) times daily.  04/13/14  Yes Adeline Saralyn Pilar, MD  Multiple Vitamins-Minerals (CENTRUM SILVER ULTRA WOMENS PO) Take 1 capsule by mouth daily.    Yes Historical Provider, MD  nitroGLYCERIN (NITROSTAT) 0.4 MG SL tablet Place 0.4 mg under the tongue every 5 (five) minutes as needed for chest pain.   Yes Historical Provider, MD  potassium chloride (K-DUR) 10 MEQ tablet Take 1 tablet (10 mEq total) by mouth daily. 03/12/14  Yes Peter M Martinique, MD  promethazine (PHENERGAN) 25 MG tablet Take 1 tablet (25 mg total) by mouth every 6 (six) hours as needed for nausea or vomiting. 03/25/14  Yes Marletta Lor, MD  sertraline (ZOLOFT) 50 MG tablet Take 25 mg by mouth daily.    Yes Historical Provider, MD  tiotropium (SPIRIVA) 18 MCG inhalation capsule Place 1 capsule (18 mcg total) into inhaler and inhale daily. 08/08/14  Yes Elsie Stain, MD    Current Facility-Administered Medications  Medication Dose Route Frequency Provider Last Rate Last Dose  . 0.9 %  sodium chloride infusion  250 mL Intravenous PRN Cletus Gash, MD      . acetaminophen (TYLENOL) tablet 650 mg  650 mg Oral Q4H PRN Cletus Gash, MD      . albuterol (PROVENTIL) (2.5 MG/3ML) 0.083% nebulizer solution 3 mL  3 mL Inhalation Q4H PRN Cletus Gash, MD      . amiodarone (PACERONE) tablet 200 mg  200 mg Oral Daily Cletus Gash, MD   200 mg at 08/15/14 1021  . antiseptic oral rinse (CPC /  CETYLPYRIDINIUM CHLORIDE 0.05%) solution 7 mL  7 mL Mouth Rinse BID Dorothy Spark, MD   7 mL at 08/14/14 2200  . aspirin EC tablet 81 mg  81 mg Oral Daily Cletus Gash, MD   81 mg at 08/15/14 1021  . diltiazem (CARDIZEM CD) 24 hr capsule 120 mg  120 mg Oral Daily Cletus Gash, MD   120 mg at 08/15/14 1021  . enoxaparin (LOVENOX) injection 30 mg  30 mg Subcutaneous Q24H Cletus Gash, MD   30 mg at 08/15/14 1021  . furosemide (LASIX) tablet 40 mg  40 mg Oral  Daily Candee Furbish, MD   40 mg at 08/15/14 1021  . gabapentin (NEURONTIN) tablet 600 mg  600 mg Oral BID Hosie Poisson, MD      . HYDROcodone-acetaminophen (NORCO/VICODIN) 5-325 MG per tablet 0.5 tablet  0.5 tablet Oral Q6H PRN Cletus Gash, MD      . insulin aspart (novoLOG) injection 0-15 Units  0-15 Units Subcutaneous TID WC Cletus Gash, MD   2 Units at 08/13/14 1227  . LORazepam (ATIVAN) tablet 0.5 mg  0.5 mg Oral BID PRN Cletus Gash, MD      . metoprolol tartrate (LOPRESSOR) tablet 25 mg  25 mg Oral BID Cletus Gash, MD   25 mg at 08/15/14 1021  . nitroGLYCERIN (NITROSTAT) SL tablet 0.4 mg  0.4 mg Sublingual Q5 min PRN Cletus Gash, MD      . ondansetron [facility]) injection 4 mg  4 mg Intravenous Q6H PRN Cletus Gash, MD      . potassium chloride (K-DUR) CR tablet 10 mEq  10 mEq Oral Daily Cletus Gash, MD   10 mEq at 08/15/14 1021  . sertraline (ZOLOFT) tablet 25 mg  25 mg Oral Daily Cletus Gash, MD   25 mg at 08/15/14 1021  . sodium chloride 0.9 % injection 3 mL  3 mL Intravenous Q12H Cletus Gash, MD   3 mL at 08/14/14 1000  . sodium chloride 0.9 % injection 3 mL  3 mL Intravenous PRN Cletus Gash, MD      . tiotropium [facility]) inhalation capsule 18 mcg  18 mcg Inhalation Daily Cletus Gash, MD   18 mcg at 08/15/14 1008    Allergies as of 08/12/2014 - Review Complete 08/12/2014  Allergen Reaction Noted  . Codeine phosphate  01/25/2007    Family History  Problem Relation  Age of Onset  . Diabetes Father   . Heart attack Mother   . Diabetes    . Coronary artery disease    . Colon cancer Neg Hx     History   Social History  . Marital Status: Widowed    Spouse Name: N/A    Number of Children: 2  . Years of Education: N/A   Occupational History  . Receptionist    Social History Main Topics  . Smoking status: Former Smoker -- 1.50 packs/day for 40 years    Types: Cigarettes    Quit date: 06/29/2000  . Smokeless tobacco: Never Used  . Alcohol Use: No  . Drug Use: No  . Sexual Activity: Not on file   Other Topics Concern  . Not on file   Social History Narrative  . No narrative on file       Physical Exam: Vital signs in last 24 hours: Temp:  [97.3 F (36.3 C)-98.6 F (37 C)] 98.1 F (36.7 C) (09/17 0900) Pulse Rate:  [60-78] 72 (09/17 1008) Resp:  [16-20] 18 (09/17 0900) BP: (94-130)/(31-54) 122/38 mmHg (09/17 1008) SpO2:  [84 %-96 %] 96 % (09/17 1008) Weight:  [67.3 kg (148 lb 5.9 oz)] 67.3 kg (148 lb 5.9 oz) (09/17 0326) Last BM Date: 08/11/14  General:   Elderly female, Alert,  Well-developed, well-nourished, pleasant and cooperative in NAD HEENT: MMM, mouth and throat without erythema or exudate Lungs:  Clear throughout to auscultation.   No wheezes, crackles, or rhonchi.  Heart:  Regular rate and rhythm; no murmurs, clicks, rubs,  or gallops. Abdomen:  Soft, slightly tender to deep palpation only left of umbilicus, nondistended. No masses, hepatosplenomegaly or hernias noted. Normal  bowel sounds, no guarding, no rebound.   Extremities:  Without edema. Neurologic:  Alert and oriented x4;  grossly normal neurologically. Skin:  5-6, 0.5-2.0 cm annular lesions noted on right lower extremity, 2-3, 0.5 cm same lesions of left lower extremity Psych:  Alert and cooperative. Normal mood and affect.   Lab Results:  Recent Labs  08/14/14 0309 08/14/14 1158 08/15/14 0455  WBC 6.7 6.9 6.4  HGB 7.2* 7.9* 9.3*  HCT 23.3* 25.6*  28.9*  PLT 287 332 270   BMET  Recent Labs  08/13/14 0352 08/14/14 0309 08/15/14 0455  NA 139 142 138  K 4.0 3.8 3.7  CL 99 102 98  CO2 24 26 23   GLUCOSE 109* 66* 73  BUN 24* 26* 19  CREATININE 1.27* 1.33* 1.11*  CALCIUM 9.1 8.3* 8.2*   LFT  Recent Labs  08/13/14 0352  08/15/14 0455  PROT 6.4  < > 5.7*  ALBUMIN 2.5*  < > 2.1*  AST 70*  < > 36  ALT 120*  < > 58*  ALKPHOS 115  < > 92  BILITOT 0.7  < > 0.9  BILIDIR 0.3  --   --   IBILI 0.4  --   --   < > = values in this interval not displayed. PT/INR  Recent Labs  08/12/14 1825  LABPROT 16.3*  INR 1.31    Studies/Results: No results found.  Impression/Plan:  GI Bleed/ Blood Loss Anemia - hemoglobin improved to around 9.  - Xarelto and pletal discontinued.  On low dose aspirin will cautiously continue for now. - 2 units PRBCs  transfused 9/16 - [facility] consulted.  Colonoscopy and endoscopy revealed no clear etiology of bleed.  Results of capsule endo are pending.  Paroxysmal atrial fibrillation  - Currently sinus rhythm on amiodarone, low-dose, cardizem, aspirin.  Xarelto and pletal discontinued. - Maintaining sinus rhythm. Rate controlled.  Acute on chronic diastolic heart failure (EF 50-55%)  - Diuresised 9/15  - Change to PO Lasix, 40 mg once a day  - BNP 8000 on admission. Mildly elevated troponin, demand ischemia.  - Continue to monitor pressure   Demand ischemia - Continued mildly elevated troponin.  - No cardiac catheterization, no symptoms of unstable angina.  - Low-dose aspirin. Continue with beta blocker.   COPD  - Stable. - Continue with oxygen therapy.  Will check ambulating oxygen sats after blood transfusion. - Continue COPD regimen.   Diabetes - Hold metformin and amaryl -Continue sliding scale.   Abnormal Urinalysis -appears not to be a clean catch. -patient asymptomatic.  Will not reorder u/a unless symptomatic.  Elevated LFTs  - Fluconazole discontinued. -  Amiodarone continued, patient currently in sinus rhythm. Pharmacist felt that amiodarone dose was low and therefore likely not culprit for elevated enzymes.  - Statin on hold. If LFTs remain an issue with statins, possibly consider PCSK 9.  - ALT improving. Currently 3.    Hypoalbuminemia -nutrition consult.  Coronary artery disease  - Bypass 2001, stable. Elevated troponin secondary to demand ischemia.  - continue aspirin 81 mg.  Peripheral vascular disease  - Aortobifemoral in 2005, stable.  - Pletal discontinued - Continue aspirin      LOS: 3 days       Hosie Poisson, MD [facility].

## 2014-08-15 NOTE — Progress Notes (Addendum)
INITIAL NUTRITION ASSESSMENT  DOCUMENTATION CODES Per approved criteria  -Not Applicable   INTERVENTION: Resource Breeze po daily PRN, each supplement provides 250 kcal and 9 grams of protein RD to follow for nutrition care plan  NUTRITION DIAGNOSIS: Inadequate oral intake related to decreased appetite as evidenced by patient report  Goal: Pt to meet >/= 90% of their estimated nutrition needs   Monitor:  PO intake, weight, labs, I/O's  Reason for Assessment: Consult  73 y.o. female  Admitting Dx: shortness of breath  ASSESSMENT: 73 y.o. Female with PMHx significant for CAD s/p CABG 2001, PAF, HTN, HFpEF, COPD who presented to [facility] on 08/12/2014 with complaints of shortness of breath.  RD consulted for assessment of nutrition status and low albumin.  Albumin has a half-life of 21 days and is strongly affected by stress response and inflammatory process, therefore, do not expect to see an improvement in this lab value during acute hospitalization.  Patient reports her appetite hasn't been very good for the past couple weeks (pt states she was hospitalized, D/C'd and then re-admitted); reports she typically consumes 2 meals per day; weight down since 08/06/14 -- more than likely given fluid loss; doesn't care for Ensure supplements, however, willing to try [employer] as needed.  Height: Ht Readings from Last 1 Encounters:  08/12/14 5' 2.5" (1.588 m)    Weight: Wt Readings from Last 1 Encounters:  08/15/14 148 lb 5.9 oz (67.3 kg)    Ideal Body Weight: 110 lb  % Ideal Body Weight: 134%  Wt Readings from Last 10 Encounters:  08/15/14 148 lb 5.9 oz (67.3 kg)  08/06/14 159 lb 1.6 oz (72.167 kg)  08/06/14 159 lb 1.6 oz (72.167 kg)  08/06/14 159 lb 1.6 oz (72.167 kg)  08/06/14 159 lb 1.6 oz (72.167 kg)  08/01/14 154 lb (69.854 kg)  07/03/14 150 lb (68.04 kg)  07/01/14 151 lb (68.493 kg)  06/10/14 152 lb (68.947 kg)  06/04/14 151 lb 4.8 oz (68.629 kg)    Usual Body  Weight: 151 lb  % Usual Body Weight: 98%  BMI:  Body mass index is 26.69 kg/(m^2).  Estimated Nutritional Needs: Kcal: 1600-1800 Protein: 70-80 gm Fluid: 1.6-1.8 L  Skin: Intact  Diet Order: Sodium Restricted  EDUCATION NEEDS: -No education needs identified at this time   Intake/Output Summary (Last 24 hours) at 08/15/14 1536 Last data filed at 08/15/14 0812  Gross per 24 hour  Intake  807.5 ml  Output   3300 ml  Net -2492.5 ml   Labs:   Recent Labs Lab 08/13/14 0352 08/14/14 0309 08/15/14 0455  NA 139 142 138  K 4.0 3.8 3.7  CL 99 102 98  CO2 24 26 23   BUN 24* 26* 19  CREATININE 1.27* 1.33* 1.11*  CALCIUM 9.1 8.3* 8.2*  GLUCOSE 109* 66* 73    CBG (last 3)   Recent Labs  08/14/14 2115 08/15/14 0613 08/15/14 1125  GLUCAP 118* 94 146*    Scheduled Meds: . amiodarone  200 mg Oral Daily  . antiseptic oral rinse  7 mL Mouth Rinse BID  . aspirin EC  81 mg Oral Daily  . diltiazem  120 mg Oral Daily  . enoxaparin (LOVENOX) injection  30 mg Subcutaneous Q24H  . furosemide  40 mg Oral Daily  . gabapentin  600 mg Oral BID  . insulin aspart  0-15 Units Subcutaneous TID WC  . metoprolol  25 mg Oral BID  . potassium chloride  10 mEq Oral Daily  .  sertraline  25 mg Oral Daily  . sodium chloride  3 mL Intravenous Q12H  . tiotropium  18 mcg Inhalation Daily    Continuous Infusions:   Past Medical History  Diagnosis Date  . ANEMIA 08/28/2010  . CAD (coronary artery disease)     a. s/p CABGx4 in 2001.  . Adenomatous colon polyp 2000  . COPD (chronic obstructive pulmonary disease)   . Depressive disorder   . GERD 12/19/2009  . Hyperlipidemia   . Hypertension   . HYPOTENSION 08/11/2010  . Osteoarthritis     Lumbar stenosis  . PVD (peripheral vascular disease)     a. Duplex 03/2013: stable moderate carotid dz - 54-09% RICA, 81-19% LICA, L subclavian artery to CCA stent widely patent.  b. Prior R fempop bypass graft, R CIA stent.  . Lung abscess 2011  .  CHF (congestive heart failure)   . Diverticulosis   . Hiatal hernia     a. Dx 01/2014, placed on Xarelto.  . Aortic stenosis     a. Mild by echo 01/2013.  . Diabetes mellitus without complication     Past Surgical History  Procedure Laterality Date  . L subclavian bypass  07/2004  . Tubal ligation    . Coronary artery bypass graft  05/2000    LIMA-LAD, SVG-OM, SVG-PDA-PL  . Femoral popliteal bypass-r  04/1999  . Right common iliac pta with stent placement  07/2000  . Right leg blockage  2003  . Aorta bifemoral bypass graft  2004  . Left cea  2005  . Lung cyst biopsy  2011  . Esophagogastroduodenoscopy N/A 08/03/2014    Procedure: ESOPHAGOGASTRODUODENOSCOPY (EGD);  Surgeon: Juanita Craver, MD;  Location: [facility] ENDOSCOPY;  Service: Endoscopy;  Laterality: N/A;  . Colonoscopy Left 08/05/2014    Procedure: COLONOSCOPY;  Surgeon: Juanita Craver, MD;  Location: [place];  Service: Endoscopy;  Laterality: Left;  Freda Munro capsule study N/A 08/06/2014    Procedure: GIVENS CAPSULE STUDY;  Surgeon: Irene Shipper, MD;  Location: [place];  Service: Endoscopy;  Laterality: N/A;    Arthur Holms, RD, LDN Pager #: [phone] After-Hours Pager #: [phone]

## 2014-08-15 NOTE — Progress Notes (Signed)
Primary Cardiologist: Dr. Meda Coffee  Subjective:  73 year old female with coronary artery disease status post bypass in 2001, paroxysmal atrial fibrillation, diastolic heart failure chronic, hypertension, COPD discharge on 9/9 secondary to GI bleed and troponin elevation sent from PCP hypoxic in the 80s to the emergency room requiring 3 L of oxygen.  Asked when she was going to be ready to go home.   Objective:  Vital Signs in the last 24 hours: Temp:  [97.3 F (36.3 C)-98.6 F (37 C)] 98.6 F (37 C) (09/17 0502) Pulse Rate:  [60-79] 71 (09/17 0812) Resp:  [16-20] 18 (09/17 0502) BP: (91-130)/(29-54) 130/47 mmHg (09/17 0812) SpO2:  [84 %-96 %] 91 % (09/17 0812) Weight:  [148 lb 5.9 oz (67.3 kg)] 148 lb 5.9 oz (67.3 kg) (09/17 0326)  Intake/Output from previous day: 09/16 0701 - 09/17 0700 In: 940 [P.O.:240; Blood:700] Out: 4150 [Urine:4150]   Physical Exam: General: Well developed, well nourished, in no acute distress. Head:  Normocephalic and atraumatic. No significant JVD Lungs: Clear to auscultation and percussion. Heart: Normal S1 and S2.  1/6 systolic right upper sternal border murmur, rubs or gallops.  Abdomen: soft, non-tender, positive bowel sounds. Extremities: No clubbing or cyanosis. No edema. Neurologic: Alert and oriented x 3.    Lab Results:  Recent Labs  08/14/14 1158 08/15/14 0455  WBC 6.9 6.4  HGB 7.9* 9.3*  PLT 332 270    Recent Labs  08/14/14 0309 08/15/14 0455  NA 142 138  K 3.8 3.7  CL 102 98  CO2 26 23  GLUCOSE 66* 73  BUN 26* 19  CREATININE 1.33* 1.11*    Recent Labs  08/12/14 1825 08/13/14 0352  TROPONINI 0.55* 0.40*   Hepatic Function Panel  Recent Labs  08/13/14 0352  08/15/14 0455  PROT 6.4  < > 5.7*  ALBUMIN 2.5*  < > 2.1*  AST 70*  < > 36  ALT 120*  < > 58*  ALKPHOS 115  < > 92  BILITOT 0.7  < > 0.9  BILIDIR 0.3  --   --   IBILI 0.4  --   --   < > = values in this interval not displayed.  Imaging: No  results found. Personally viewed.   Telemetry: Normal rhythm no further AFIB Personally viewed.   EKG:  Sinus rhythm, poor R wave progression, nonspecific ST-T wave changes, baseline wander  Cardiac Studies:  Normal ejection fraction, mild pulmonary hypertension, dilated left atrium  Assessment/Plan:   1. Acute on chronic diastolic heart failure (EF 50-55%)  - Lasix, 40 mg once a day  - BNP 8000 on admission. Mildly elevated troponin, demand ischemia.   2. Demand ischemia-  - mildly elevated troponin.  - No cardiac catheterization, no symptoms of unstable angina.  - Low-dose aspirin. We may need to stop with recent decrease again in hemoglobin. Continue with beta blocker.  3. COPD   - Chronic, playing a role in her dyspnea.  - Continue with oxygen therapy for now. Continue COPD regimen.  4. Diabetes-holding metformin through hospitalization. Continue with glimiperide and sliding scale.  5. Urinalysis-abnormal. Awaiting cultures to determine treatment.  6. Elevated LFTs  - Stopped fluconazole day 6 of 10 started for candidiasis on EGD. Discontinued fluconazole  secondary to elevated liver enzymes. Please see pharmacy note which suggests discontinuation as well.  - Amiodarone, currently in sinus rhythm. Pharmacist felt that amiodarone dose was low and therefore likely not culprit for elevated enzymes.  - Statin on hold.  If LFTs remain an issue with statins, could consider PCSK 9.  - ALT is improving. Currently 19.  - ? Albumin very low. Could this be acute phase reactant, nutritional?   7. Paroxysmal atrial fibrillation  - Currently sinus rhythm on amiodarone, low-dose  - Maintaining sinus rhythm. Cardizem CD 120 as well.  8. Coronary artery disease  - Bypass 2001, stable. Elevated troponin secondary to demand ischemia.  9. Peripheral vascular disease  - Aortobifemoral in 2005, stable. Pletal -stopped given her history of diastolic heart failure and potential bleeding.  Continue aspirin if possible.  10. Anemia-GI seeing.  She is off of her Xarelto for atrial fibrillation.  Appreciate [facility] team for assuming attending role.  Cardiac issues are currently controlled.   When ready for DC will get appt. With Dr. Meda Coffee in 7 days.  SKAINS, [place] 08/15/2014, 8:43 AM

## 2014-08-16 LAB — COMPREHENSIVE METABOLIC PANEL
ALT: 48 U/L — ABNORMAL HIGH (ref 0–35)
AST: 26 U/L (ref 0–37)
Albumin: 2.3 g/dL — ABNORMAL LOW (ref 3.5–5.2)
Alkaline Phosphatase: 92 U/L (ref 39–117)
Anion gap: 14 (ref 5–15)
BILIRUBIN TOTAL: 0.5 mg/dL (ref 0.3–1.2)
BUN: 18 mg/dL (ref 6–23)
CO2: 28 meq/L (ref 19–32)
CREATININE: 1.09 mg/dL (ref 0.50–1.10)
Calcium: 8.5 mg/dL (ref 8.4–10.5)
Chloride: 100 mEq/L (ref 96–112)
GFR calc Af Amer: 57 mL/min — ABNORMAL LOW (ref >90)
GFR, EST NON AFRICAN AMERICAN: 49 mL/min — AB (ref >90)
Glucose, Bld: 81 mg/dL (ref 70–99)
POTASSIUM: 3.9 meq/L (ref 3.7–5.3)
Sodium: 142 mEq/L (ref 137–147)
Total Protein: 5.9 g/dL — ABNORMAL LOW (ref 6.0–8.3)

## 2014-08-16 LAB — CBC
HEMATOCRIT: 31.5 % — AB (ref 36.0–46.0)
Hemoglobin: 9.8 g/dL — ABNORMAL LOW (ref 12.0–15.0)
MCH: 25.1 pg — AB (ref 26.0–34.0)
MCHC: 31.1 g/dL (ref 30.0–36.0)
MCV: 80.8 fL (ref 78.0–100.0)
PLATELETS: 293 10*3/uL (ref 150–400)
RBC: 3.9 MIL/uL (ref 3.87–5.11)
RDW: 17.5 % — AB (ref 11.5–15.5)
WBC: 6.9 10*3/uL (ref 4.0–10.5)

## 2014-08-16 LAB — GLUCOSE, CAPILLARY: Glucose-Capillary: 93 mg/dL (ref 70–99)

## 2014-08-16 MED ORDER — BOOST / RESOURCE BREEZE PO LIQD: 1.0000 | Freq: Every day | ORAL | Status: DC | PRN

## 2014-08-16 MED ORDER — FUROSEMIDE 40 MG PO TABS: 40.0000 mg | ORAL_TABLET | Freq: Every day | ORAL | Status: DC

## 2014-08-16 MED ORDER — ASPIRIN 81 MG PO TBEC: 81.0000 mg | DELAYED_RELEASE_TABLET | Freq: Every day | ORAL | Status: AC

## 2014-08-16 NOTE — Progress Notes (Signed)
Pt d/c home.  Alert and oriented x4.  No c/o pain.  Education given on diet, activity, meds, and follow-up care and instructions.  Pt verbalized understanding.  IV D/C'd.  Tele D/C'd.  Will be picked up by daughter after home oxygen is set up.

## 2014-08-16 NOTE — Discharge Summary (Signed)
Physician Discharge Summary  [name]:[id number] DOB: 1941/02/09 DOA: 08/12/2014  PCP: Nyoka Cowden, MD  Admit date: 08/12/2014 Discharge date: 08/16/2014  Time spent: 30 minutes  Recommendations for Outpatient Follow-up:  1. Follow up with PCP in one week.  Discharge Diagnoses:  Active Problems:   CHF (congestive heart failure), NYHA class II   Low oxygen saturation   Discharge Condition: improved  Diet recommendation: low sodium diet  Filed Weights   08/14/14 0405 08/15/14 0326 08/16/14 0342  Weight: 68.947 kg (152 lb) 67.3 kg (148 lb 5.9 oz) 66.6 kg (146 lb 13.2 oz)    History of present illness:  [name] is a 73 y.o. female with a PMH of CAD, COPD, Afib, and presumed GI bleed, blood loss anemia. Patient's PCP advised patient to come to ED 9/14 due to hypoxia in the 80's and required 3L of oxygen at that time. She was admitted to cardiology for CHF and Afib. Cardiology feels that she is stable from their point of view at this time and request that [facility] assume care. She was noted to have a drop in hemoglobin. Xarelto was discontinued. Of note the patient was discharged from [facility] on 9/9 after suspected GI bleed. She reported black, tarry stools prior to admission on 9/3; colonoscopy and endoscopy from that admission showed no definitive etiology of bleed. Patient was anemic on last admission and was transfused two units of PRBCs which improved her hgb from 5.4 to 8.6. As of 9/16 her hgb is 7.2  She states she feels better today and breathing has improved significantly with oxygen therapy.    Hospital Course:   GI Bleed/ Blood Loss Anemia  - hemoglobin improved to around 9.  - Xarelto and pletal discontinued. On low dose aspirin will cautiously continue for now.  - 2 units PRBCs transfused 9/16  - [facility] consulted. Colonoscopy and endoscopy revealed no clear etiology of bleed. Results of capsule endo are pending.  Paroxysmal atrial  fibrillation  - Currently sinus rhythm on amiodarone, low-dose, cardizem, aspirin. Xarelto and pletal discontinued.  - Maintaining sinus rhythm. Rate controlled.  Acute on chronic diastolic heart failure (EF 50-55%)  - Diuresised 9/15  - Change to PO Lasix, 40 mg once a day  - BNP 8000 on admission. Mildly elevated troponin, demand ischemia.  - Continue to monitor pressure  Demand ischemia  - Continued mildly elevated troponin.  - No cardiac catheterization, no symptoms of unstable angina.  - Low-dose aspirin. Continue with beta blocker.  COPD  - Stable.  - Continue with oxygen therapy. Will check ambulating oxygen sats after blood transfusion.  - Continue COPD regimen.  Diabetes  - Hold metformin and amaryl  -Continue sliding scale.  Abnormal Urinalysis  -appears not to be a clean catch.  -patient asymptomatic. Will not reorder u/a unless symptomatic.  Elevated LFTs  - Fluconazole discontinued.  - Amiodarone continued, patient currently in sinus rhythm. Pharmacist felt that amiodarone dose was low and therefore likely not culprit for elevated enzymes.  - Statin on hold. If LFTs remain an issue with statins, possibly consider PCSK 9.  - ALT improving. Currently 17.  Hypoalbuminemia  -nutrition consult.  Coronary artery disease  - Bypass 2001, stable. Elevated troponin secondary to demand ischemia.  - continue aspirin 81 mg.  Peripheral vascular disease  - Aortobifemoral in 2005, stable.  - Pletal discontinued  - Continue aspirin   Procedures:  none  Consultations:  cardiology  Discharge Exam: Filed Vitals:  08/16/14 1012  BP: 125/72  Pulse: 72  Temp:   Resp:     General: alert afebrile comfortable Cardiovascular: s1s2 Respiratory: ctab  Discharge Instructions You were cared for by a hospitalist during your hospital stay. If you have any questions about your discharge medications or the care you received while you were in the hospital after you are  discharged, you can call the unit and asked to speak with the hospitalist on call if the hospitalist that took care of you is not available. Once you are discharged, your primary care physician will handle any further medical issues. Please note that NO REFILLS for any discharge medications will be authorized once you are discharged, as it is imperative that you return to your primary care physician (or establish a relationship with a primary care physician if you do not have one) for your aftercare needs so that they can reassess your need for medications and monitor your lab values.  Discharge Instructions   (HEART FAILURE PATIENTS) Call MD:  Anytime you have any of the following symptoms: 1) 3 pound weight gain in 24 hours or 5 pounds in 1 week 2) shortness of breath, with or without a dry hacking cough 3) swelling in the hands, feet or stomach 4) if you have to sleep on extra pillows at night in order to breathe.    Complete by:  As directed      Call MD for:  difficulty breathing, headache or visual disturbances    Complete by:  As directed      Call MD for:  persistant nausea and vomiting    Complete by:  As directed      Call MD for:  temperature >100.4    Complete by:  As directed      Discharge instructions    Complete by:  As directed   Follow u pwith PCP IN 2 weeks  Follow up with Dr Meda Coffee in 1 week as recommended.          Current Discharge Medication List    START taking these medications   Details  aspirin EC 81 MG EC tablet Take 1 tablet (81 mg total) by mouth daily. Qty: 30 tablet, Refills: 0    feeding supplement, RESOURCE BREEZE, (RESOURCE BREEZE) LIQD Take 1 Container by mouth daily as needed (suboptimal intake). Refills: 0      CONTINUE these medications which have CHANGED   Details  furosemide (LASIX) 40 MG tablet Take 1 tablet (40 mg total) by mouth daily. Qty: 90 tablet, Refills: 1   Associated Diagnoses: Unspecified essential hypertension; Coronary  atherosclerosis of artery bypass graft; Peripheral vascular disease with claudication; Aortic stenosis; SVT (supraventricular tachycardia); Paroxysmal atrial flutter; Other and unspecified hyperlipidemia; Anemia, unspecified; Cerebrovascular disease      CONTINUE these medications which have NOT CHANGED   Details  albuterol (PROVENTIL HFA;VENTOLIN HFA) 108 (90 BASE) MCG/ACT inhaler Inhale 2 puffs into the lungs every 4 (four) hours as needed for wheezing or shortness of breath.    amiodarone (PACERONE) 200 MG tablet Take 1 tablet (200 mg total) by mouth daily. Qty: 90 tablet, Refills: 6    atorvastatin (LIPITOR) 40 MG tablet Take 1 tablet (40 mg total) by mouth every evening. Qty: 90 tablet, Refills: 1    diltiazem (CARDIZEM CD) 120 MG 24 hr capsule Take 1 capsule by mouth daily.   Associated Diagnoses: Paroxysmal atrial flutter    gabapentin (NEURONTIN) 600 MG tablet Take 600 mg by mouth daily as needed.  For pain    glimepiride (AMARYL) 1 MG tablet Take 1 mg by mouth daily with breakfast.    HYDROcodone-acetaminophen (NORCO/VICODIN) 5-325 MG per tablet Take 0.5 tablets by mouth every 6 (six) hours as needed for moderate pain. Qty: 90 tablet, Refills: 0    LORazepam (ATIVAN) 0.5 MG tablet Take 0.5 mg by mouth 2 (two) times daily.    metFORMIN (GLUCOPHAGE) 500 MG tablet Take 1 tablet (500 mg total) by mouth 2 (two) times daily with a meal. Qty: 180 tablet, Refills: 3    metoprolol (LOPRESSOR) 50 MG tablet Take 25 mg by mouth 2 (two) times daily.    Associated Diagnoses: Paroxysmal atrial flutter    Multiple Vitamins-Minerals (CENTRUM SILVER ULTRA WOMENS PO) Take 1 capsule by mouth daily.     nitroGLYCERIN (NITROSTAT) 0.4 MG SL tablet Place 0.4 mg under the tongue every 5 (five) minutes as needed for chest pain.    potassium chloride (K-DUR) 10 MEQ tablet Take 1 tablet (10 mEq total) by mouth daily. Qty: 30 tablet, Refills: 6    promethazine (PHENERGAN) 25 MG tablet Take 1 tablet  (25 mg total) by mouth every 6 (six) hours as needed for nausea or vomiting. Qty: 20 tablet, Refills: 2    sertraline (ZOLOFT) 50 MG tablet Take 25 mg by mouth daily.     tiotropium (SPIRIVA) 18 MCG inhalation capsule Place 1 capsule (18 mcg total) into inhaler and inhale daily. Qty: 20 capsule, Refills: 0      STOP taking these medications     cilostazol (PLETAL) 100 MG tablet      fluconazole (DIFLUCAN) 100 MG tablet        Allergies  Allergen Reactions  . Codeine Phosphate     REACTION: unspecified      The results of significant diagnostics from this hospitalization (including imaging, microbiology, ancillary and laboratory) are listed below for reference.    Significant Diagnostic Studies: Dg Chest 2 View  08/12/2014   CLINICAL DATA:  Recently discharged. Anemic and shortness of breath. Low oxygen levels. High blood pressure. History of diabetes, aortic stenosis, anemia, CAD, COPD, GERD, PVD, CHF.  EXAM: CHEST  2 VIEW  COMPARISON:  08/02/2014 and multiple prior studies  FINDINGS: Patient has had median sternotomy and CABG. The heart is enlarged. There are Kerley B-lines Richard asymmetric, left greater than right. Findings favor pulmonary edema. There is a some weight patchy distribution of densities. Findings raise impressions superimposed infectious infiltrate. Possible nodule is identified at the right lung base measuring 1.6 cm and followup is recommended. There is dense atherosclerotic calcification of the thoracic aorta.  IMPRESSION: 1. Cardiomegaly and interstitial changes consistent with pulmonary edema. 2. Patchy appearance of the lung parenchyma bilaterally consistent with superimposed infectious process. Alternatively the findings may be related to asymmetric pulmonary edema. 3. Possible right lower lobe lung nodule warrants followup. Follow-up chest x-ray to document clearing is recommended. If this density fails to clear, followup chest CT would be recommended.    Electronically Signed   By: Shon Hale M.D.   On: 08/12/2014 19:04   Dg Chest Port 1 View  08/02/2014   CLINICAL DATA:  Shortness of Breath  EXAM: PORTABLE CHEST - 1 VIEW  COMPARISON:  08/01/2014  FINDINGS: Prior CABG. Heart is borderline in size. Diffuse interstitial prominence throughout the lungs, increased since prior study, likely interstitial edema. No confluent opacities or effusions. No acute bony abnormality.  IMPRESSION: Increasing interstitial prominence, likely interstitial edema.   Electronically Signed  By: Rolm Baptise M.D.   On: 08/02/2014 07:11   Dg Chest Portable 1 View  08/02/2014   CLINICAL DATA:  MELENA FALL  EXAM: PORTABLE CHEST - 1 VIEW  COMPARISON:  04/09/2014  FINDINGS: Previous CABG. Heart size upper limits normal. Patchy interstitial opacities in both lung bases slightly more conspicuous than on prior study. No focal airspace disease. No overt edema. No effusion. Atheromatous aorta.  IMPRESSION: 1. Probable mild bibasilar atelectasis. Otherwise stable appearance since previous exam.   Electronically Signed   By: Arne Cleveland M.D.   On: 08/02/2014 00:55    Microbiology: No results found for this or any previous visit (from the past 240 hour(s)).   Labs: Basic Metabolic Panel:  Recent Labs Lab 08/12/14 1825 08/13/14 0352 08/14/14 0309 08/15/14 0455 08/16/14 0335  NA 137 139 142 138 142  K 4.6 4.0 3.8 3.7 3.9  CL 98 99 102 98 100  CO2 19 24 26 23 28   GLUCOSE 126* 109* 66* 73 81  BUN 24* 24* 26* 19 18  CREATININE 1.23* 1.27* 1.33* 1.11* 1.09  CALCIUM 9.7 9.1 8.3* 8.2* 8.5   Liver Function Tests:  Recent Labs Lab 08/12/14 1825 08/13/14 0352 08/14/14 0309 08/15/14 0455 08/16/14 0335  AST 107* 70* 42* 36 26  ALT 156* 120* 78* 58* 48*  ALKPHOS 141* 115 103 92 92  BILITOT 0.9 0.7 0.6 0.9 0.5  PROT 7.5 6.4 5.6* 5.7* 5.9*  ALBUMIN 2.9* 2.5* 2.2* 2.1* 2.3*   No results found for this basename: LIPASE, AMYLASE,  in the last 168 hours No results found  for this basename: AMMONIA,  in the last 168 hours CBC:  Recent Labs Lab 08/12/14 1740 08/14/14 0309 08/14/14 1158 08/15/14 0455 08/16/14 0335  WBC 9.9 6.7 6.9 6.4 6.9  HGB 9.2* 7.2* 7.9* 9.3* 9.8*  HCT 30.2* 23.3* 25.6* 28.9* 31.5*  MCV 81.6 80.1 79.0 79.8 80.8  PLT 128* 287 332 270 293   Cardiac Enzymes:  Recent Labs Lab 08/12/14 1825 08/13/14 0352  CKTOTAL  --  95  TROPONINI 0.55* 0.40*   BNP: BNP (last 3 results)  Recent Labs  04/09/14 2101 08/12/14 1825  PROBNP 678.0* 8774.0*   CBG:  Recent Labs Lab 08/15/14 0613 08/15/14 1125 08/15/14 1605 08/15/14 2113 08/16/14 0617  GLUCAP 94 146* 106* 94 93       Signed:  Julian Askin  [facility] 08/16/2014, 11:56 AM

## 2014-08-16 NOTE — Progress Notes (Addendum)
Primary Cardiologist: Dr. Meda Coffee  Subjective:  73 year old female with coronary artery disease status post bypass in 2001, paroxysmal atrial fibrillation, diastolic heart failure chronic, hypertension, COPD discharge on 9/9 secondary to GI bleed and troponin elevation sent from PCP hypoxic in the 80s to the emergency room requiring 3 L of oxygen.  Ready to go home.   Objective:  Vital Signs in the last 24 hours: Temp:  [97.5 F (36.4 C)-98 F (36.7 C)] 98 F (36.7 C) (09/18 0342) Pulse Rate:  [63-73] 73 (09/18 0342) Resp:  [18] 18 (09/18 0342) BP: (117-132)/(38-56) 117/45 mmHg (09/18 0342) SpO2:  [91 %-96 %] 94 % (09/18 0825) Weight:  [146 lb 13.2 oz (66.6 kg)] 146 lb 13.2 oz (66.6 kg) (09/18 0342)  Intake/Output from previous day: 09/17 0701 - 09/18 0700 In: 360 [P.O.:360] Out: 3250 [Urine:3250]   Physical Exam: General: Well developed, well nourished, in no acute distress. Head:  Normocephalic and atraumatic. No significant JVD Lungs: Clear to auscultation and percussion. Heart: Normal S1 and S2.  1/6 systolic right upper sternal border murmur, rubs or gallops.  Abdomen: soft, non-tender, positive bowel sounds. Extremities: No clubbing or cyanosis. No edema. Neurologic: Alert and oriented x 3.    Lab Results:  Recent Labs  08/15/14 0455 08/16/14 0335  WBC 6.4 6.9  HGB 9.3* 9.8*  PLT 270 293    Recent Labs  08/15/14 0455 08/16/14 0335  NA 138 142  K 3.7 3.9  CL 98 100  CO2 23 28  GLUCOSE 73 81  BUN 19 18  CREATININE 1.11* 1.09   Recent Labs  08/16/14 0335  PROT 5.9*  ALBUMIN 2.3*  AST 26  ALT 48*  ALKPHOS 92  BILITOT 0.5    Telemetry: Normal rhythm no further AFIB Personally viewed.   EKG:  Sinus rhythm, poor R wave progression, nonspecific ST-T wave changes, baseline wander  Cardiac Studies:  Normal ejection fraction, mild pulmonary hypertension, dilated left atrium  Assessment/Plan:   1. Acute on chronic diastolic heart failure (EF  50-55%)  - Lasix, 40 mg once a day  - BNP 8000 on admission. Mildly elevated troponin, demand ischemia.   2. Demand ischemia-  - mildly elevated troponin.  - No cardiac catheterization, no symptoms of unstable angina.  - Low-dose aspirin. Continue with beta blocker.  3. COPD   - Chronic, playing a role in her dyspnea.  - Continue with oxygen therapy for now. Continue COPD regimen.  4. Diabetes-holding metformin through hospitalization. Continue with glimiperide and sliding scale.  5. Elevated LFTs  - Stopped fluconazole day 6 of 10 started for candidiasis on EGD. Discontinued fluconazole  secondary to elevated liver enzymes. Please see pharmacy note which suggests discontinuation as well.  - Amiodarone, currently in sinus rhythm. Pharmacist felt that amiodarone dose was low and therefore likely not culprit for elevated enzymes, agree  - Statin on hold. If LFTs remain an issue with statins, could consider PCSK 9.  - ALT is improving.  - ? Albumin very low. Could this be acute phase reactant, nutritional?   6. Paroxysmal atrial fibrillation  - Currently sinus rhythm on amiodarone, low-dose  - Maintaining sinus rhythm. Cardizem CD 120 as well.  7. Coronary artery disease  - Bypass 2001, stable. Elevated troponin secondary to demand ischemia.  8. Peripheral vascular disease  - Aortobifemoral in 2005, stable. Pletal -stopped given her history of diastolic heart failure and potential bleeding. Continue aspirin if possible.  9. Anemia-GI seeing.  She is off of  her Xarelto for atrial fibrillation.  Appreciate [facility] team for assuming attending role.  Cardiac issues are currently controlled. OK with DC from heart perspective.   Has close follow up scheduled in cardiology clinic. Will sign off.   [name], [place] 08/16/2014, 9:36 AM

## 2014-08-19 ENCOUNTER — Ambulatory Visit (INDEPENDENT_AMBULATORY_CARE_PROVIDER_SITE_OTHER): Payer: Medicare Other | Admitting: Internal Medicine

## 2014-08-19 ENCOUNTER — Other Ambulatory Visit: Payer: Medicare Other

## 2014-08-19 ENCOUNTER — Encounter: Payer: Self-pay | Admitting: Internal Medicine

## 2014-08-19 ENCOUNTER — Ambulatory Visit: Payer: Medicare Other | Admitting: Internal Medicine

## 2014-08-19 VITALS — BP 130/56 | HR 63 | Ht 62.5 in | Wt 142.2 lb

## 2014-08-19 DIAGNOSIS — I5032 Chronic diastolic (congestive) heart failure: Secondary | ICD-10-CM

## 2014-08-19 DIAGNOSIS — D649 Anemia, unspecified: Secondary | ICD-10-CM

## 2014-08-19 DIAGNOSIS — I480 Paroxysmal atrial fibrillation: Secondary | ICD-10-CM

## 2014-08-19 DIAGNOSIS — E876 Hypokalemia: Secondary | ICD-10-CM

## 2014-08-19 DIAGNOSIS — I4891 Unspecified atrial fibrillation: Secondary | ICD-10-CM | POA: Insufficient documentation

## 2014-08-19 DIAGNOSIS — I4892 Unspecified atrial flutter: Secondary | ICD-10-CM

## 2014-08-19 NOTE — Assessment & Plan Note (Signed)
Currently, her heart failure symptoms are well compensated. She has had a propensity for volume overload, particularly in association with anemia. We discussed the importance of a low-sodium diet, and the taking of medications, and daily weights. I also discussed the importance of keeping the weight under 148 pounds.

## 2014-08-19 NOTE — Assessment & Plan Note (Signed)
Her atrial arrhythmias appear to be well-controlled. She will continue low-dose amiodarone therapy. At this point, I would continue this dose of amiodarone. She will not continue anticoagulation because of unexplained GI bleeding, causing severe anemia.

## 2014-08-19 NOTE — Progress Notes (Signed)
HPI [name] returns today for ongoing evaluation of atrial fibrillation and atrial flutter. She has an extensive past medical history with severe vascular disease including CAD, s/p CABG, peripheral vascular disease with subclavian artery bypass, HTN, and dyslipidemia. In the interim she developed atrial flutter with an RVR which has been only modestly symptomatic. She was placed on systemic anti-coagulation and started on amiodarone.  When I reviewed her cardiac monitor it was my impression that she had both atrial fib and flutter and that flutter ablation alone would be unlikely to control her symptoms. Since her last visit, she is been hospitalized with congestive heart failure. She will typically gaining 10 or 15 pounds and become dyspneic, requiring hospitalization for intravenous diuretic therapy. She is now following her weight very carefully. Allergies  Allergen Reactions  . Codeine Phosphate     REACTION: unspecified     Current Outpatient Prescriptions  Medication Sig Dispense Refill  . albuterol (PROVENTIL HFA;VENTOLIN HFA) 108 (90 BASE) MCG/ACT inhaler Inhale 2 puffs into the lungs every 4 (four) hours as needed for wheezing or shortness of breath.      Marland Kitchen amiodarone (PACERONE) 200 MG tablet Take 1 tablet (200 mg total) by mouth daily.  90 tablet  6  . aspirin EC 81 MG EC tablet Take 1 tablet (81 mg total) by mouth daily.  30 tablet  0  . atorvastatin (LIPITOR) 40 MG tablet Take 1 tablet (40 mg total) by mouth every evening.  90 tablet  1  . diltiazem (CARDIZEM CD) 120 MG 24 hr capsule Take 1 capsule by mouth daily.      . furosemide (LASIX) 40 MG tablet Take 1 tablet (40 mg total) by mouth daily.  90 tablet  1  . gabapentin (NEURONTIN) 600 MG tablet Take 600 mg by mouth daily as needed. For pain      . glimepiride (AMARYL) 1 MG tablet Take 1 mg by mouth daily with breakfast.      . HYDROcodone-acetaminophen (NORCO/VICODIN) 5-325 MG per tablet Take 0.5 tablets by mouth  every 6 (six) hours as needed for moderate pain.  90 tablet  0  . LORazepam (ATIVAN) 0.5 MG tablet Take 0.5 mg by mouth 2 (two) times daily.      . metFORMIN (GLUCOPHAGE) 500 MG tablet Take 1 tablet (500 mg total) by mouth 2 (two) times daily with a meal.  180 tablet  3  . metoprolol (LOPRESSOR) 50 MG tablet Take 25 mg by mouth 2 (two) times daily.       . Multiple Vitamins-Minerals (CENTRUM SILVER ULTRA WOMENS PO) Take 1 capsule by mouth daily.       . nitroGLYCERIN (NITROSTAT) 0.4 MG SL tablet Place 0.4 mg under the tongue every 5 (five) minutes as needed for chest pain.      . potassium chloride (K-DUR) 10 MEQ tablet Take 1 tablet (10 mEq total) by mouth daily.  30 tablet  6  . promethazine (PHENERGAN) 25 MG tablet Take 1 tablet (25 mg total) by mouth every 6 (six) hours as needed for nausea or vomiting.  20 tablet  2  . sertraline (ZOLOFT) 50 MG tablet Take 25 mg by mouth daily.       Marland Kitchen tiotropium (SPIRIVA) 18 MCG inhalation capsule Place 1 capsule (18 mcg total) into inhaler and inhale daily.  20 capsule  0  . feeding supplement, RESOURCE BREEZE, (RESOURCE BREEZE) LIQD Take 1 Container by mouth daily as needed (suboptimal intake).  0   No current facility-administered medications for this visit.     Past Medical History  Diagnosis Date  . ANEMIA 08/28/2010  . CAD (coronary artery disease)     a. s/p CABGx4 in 2001.  . Adenomatous colon polyp 2000  . COPD (chronic obstructive pulmonary disease)   . Depressive disorder   . GERD 12/19/2009  . Hyperlipidemia   . Hypertension   . HYPOTENSION 08/11/2010  . Osteoarthritis     Lumbar stenosis  . PVD (peripheral vascular disease)     a. Duplex 03/2013: stable moderate carotid dz - 09-98% RICA, 33-82% LICA, L subclavian artery to CCA stent widely patent.  b. Prior R fempop bypass graft, R CIA stent.  . Lung abscess 2011  . CHF (congestive heart failure)   . Diverticulosis   . Hiatal hernia     a. Dx 01/2014, placed on Xarelto.  . Aortic  stenosis     a. Mild by echo 01/2013.  . Diabetes mellitus without complication     ROS:   All systems reviewed and negative except as noted in the HPI.   Past Surgical History  Procedure Laterality Date  . L subclavian bypass  07/2004  . Tubal ligation    . Coronary artery bypass graft  05/2000    LIMA-LAD, SVG-OM, SVG-PDA-PL  . Femoral popliteal bypass-r  04/1999  . Right common iliac pta with stent placement  07/2000  . Right leg blockage  2003  . Aorta bifemoral bypass graft  2004  . Left cea  2005  . Lung cyst biopsy  2011  . Esophagogastroduodenoscopy N/A 08/03/2014    Procedure: ESOPHAGOGASTRODUODENOSCOPY (EGD);  Surgeon: Juanita Craver, MD;  Location: [facility] ENDOSCOPY;  Service: Endoscopy;  Laterality: N/A;  . Colonoscopy Left 08/05/2014    Procedure: COLONOSCOPY;  Surgeon: Juanita Craver, MD;  Location: [place];  Service: Endoscopy;  Laterality: Left;  Freda Munro capsule study N/A 08/06/2014    Procedure: GIVENS CAPSULE STUDY;  Surgeon: Irene Shipper, MD;  Location: [place];  Service: Endoscopy;  Laterality: N/A;     Family History  Problem Relation Age of Onset  . Diabetes Father   . Heart attack Mother   . Diabetes    . Coronary artery disease    . Colon cancer Neg Hx      History   Social History  . Marital Status: Widowed    Spouse Name: N/A    Number of Children: 2  . Years of Education: N/A   Occupational History  . Receptionist    Social History Main Topics  . Smoking status: Former Smoker -- 1.50 packs/day for 40 years    Types: Cigarettes    Quit date: 06/29/2000  . Smokeless tobacco: Never Used  . Alcohol Use: No  . Drug Use: No  . Sexual Activity: Not on file   Other Topics Concern  . Not on file   Social History Narrative  . No narrative on file     BP 130/56  Pulse 63  Ht 5' 2.5" (1.588 m)  Wt 142 lb 3.2 oz (64.501 kg)  BMI 25.58 kg/m2  Physical Exam:  stable appearing 73 yo woman, NAD HEENT: Unremarkable Neck:  No JVD, no  thyromegally Back:  No CVA tenderness Lungs:  Clear with scattered rales. Decreased breath sounds. HEART:  Regular rate rhythm, no murmurs, no rubs, no clicks Abd:  soft, positive bowel sounds, no organomegally, no rebound, no guarding Ext:  2 plus pulses, no edema,  no cyanosis, no clubbing Skin:  No rashes no nodules Neuro:  CN II through XII intact, motor grossly intact  EKG - nsr at 65/min.   Assess/Plan:

## 2014-08-19 NOTE — Patient Instructions (Signed)
Your physician recommends that you continue on your current medications as directed. Please refer to the Current Medication list given to you today.  Postpone appointment with Dr. Meda Coffee for 2 more weeks (from 9/28 appointment_  Your physician wants you to follow-up in: 1 year with Dr. Lovena Le.  You will receive a reminder letter in the mail two months in advance. If you don't receive a letter, please call our office to schedule the follow-up appointment.

## 2014-08-20 LAB — CBC
HCT: 36.6 % (ref 36.0–46.0)
Hemoglobin: 11.5 g/dL — ABNORMAL LOW (ref 12.0–15.0)
MCHC: 31.5 g/dL (ref 30.0–36.0)
MCV: 82.1 fl (ref 78.0–100.0)
PLATELETS: 370 10*3/uL (ref 150.0–400.0)
RBC: 4.46 Mil/uL (ref 3.87–5.11)
RDW: 19.8 % — AB (ref 11.5–15.5)
WBC: 7.7 10*3/uL (ref 4.0–10.5)

## 2014-08-21 ENCOUNTER — Telehealth: Payer: Self-pay | Admitting: Internal Medicine

## 2014-08-21 NOTE — Telephone Encounter (Signed)
Left message on voicemail to call office.  

## 2014-08-21 NOTE — Telephone Encounter (Signed)
[name] from [name] called to say that she  recommend 6 additional occupational visits.Also would like to request verbal orders for  social work and  Insurance underwriter. Speech therapy for memory strategy    Phone number [phone] .

## 2014-08-21 NOTE — Telephone Encounter (Signed)
Please see message and advise if orders okay? 

## 2014-08-21 NOTE — Telephone Encounter (Signed)
ok 

## 2014-08-22 ENCOUNTER — Encounter: Payer: Self-pay | Admitting: Internal Medicine

## 2014-08-22 NOTE — Telephone Encounter (Signed)
Spoke to [employer] from [facility] gave her verbal order for Occupational Therapy 6 additional occupational visits.Also verbal orders for social work and Insurance underwriter. Speech therapy for memory strategy per Dr. Carma Lair verbalized understanding.

## 2014-08-26 ENCOUNTER — Ambulatory Visit (INDEPENDENT_AMBULATORY_CARE_PROVIDER_SITE_OTHER): Payer: Medicare Other | Admitting: Cardiology

## 2014-08-26 ENCOUNTER — Ambulatory Visit (INDEPENDENT_AMBULATORY_CARE_PROVIDER_SITE_OTHER): Payer: Medicare Other | Admitting: Gastroenterology

## 2014-08-26 ENCOUNTER — Ambulatory Visit: Payer: Medicare Other | Admitting: Gastroenterology

## 2014-08-26 ENCOUNTER — Encounter: Payer: Self-pay | Admitting: Gastroenterology

## 2014-08-26 ENCOUNTER — Encounter: Payer: Self-pay | Admitting: Cardiology

## 2014-08-26 VITALS — BP 128/58 | HR 63 | Ht 62.5 in | Wt 142.0 lb

## 2014-08-26 VITALS — BP 134/56 | HR 68 | Ht 62.5 in | Wt 142.4 lb

## 2014-08-26 DIAGNOSIS — K922 Gastrointestinal hemorrhage, unspecified: Secondary | ICD-10-CM

## 2014-08-26 DIAGNOSIS — I5033 Acute on chronic diastolic (congestive) heart failure: Secondary | ICD-10-CM

## 2014-08-26 DIAGNOSIS — K5730 Diverticulosis of large intestine without perforation or abscess without bleeding: Secondary | ICD-10-CM

## 2014-08-26 DIAGNOSIS — R319 Hematuria, unspecified: Principal | ICD-10-CM

## 2014-08-26 DIAGNOSIS — D62 Acute posthemorrhagic anemia: Secondary | ICD-10-CM

## 2014-08-26 DIAGNOSIS — I509 Heart failure, unspecified: Secondary | ICD-10-CM

## 2014-08-26 DIAGNOSIS — N390 Urinary tract infection, site not specified: Secondary | ICD-10-CM

## 2014-08-26 LAB — URINALYSIS, ROUTINE W REFLEX MICROSCOPIC
Bilirubin Urine: NEGATIVE
Hgb urine dipstick: NEGATIVE
Ketones, ur: NEGATIVE
Nitrite: NEGATIVE
RBC / HPF: NONE SEEN (ref 0–?)
Specific Gravity, Urine: 1.01 (ref 1.000–1.030)
Total Protein, Urine: NEGATIVE
Urine Glucose: NEGATIVE
Urobilinogen, UA: 0.2 (ref 0.0–1.0)
pH: 7 (ref 5.0–8.0)

## 2014-08-26 MED ORDER — FLUCONAZOLE 150 MG PO TABS
150.0000 mg | ORAL_TABLET | Freq: Every day | ORAL | Status: DC
Start: 2014-08-26 — End: 2014-10-30

## 2014-08-26 NOTE — Progress Notes (Signed)
    History of Present Illness: This is a 73 year old female recently hospitalized for melena and anemia on Xarelto. She is accompanied by her daughter. I reviewed records from September hospitalizations in [place]. She underwent upper endoscopy on 9/5 showing Candida esophagitis and a small hiatal hernia. Colonoscopy on 9/7 sigmoid diverticulosis and a hyperplastic colon polyp. Capsule endoscopy was performed on 9/8 was normal. Anticoagulants have been held. She feels well since discharge and has no gastrointestinal complaints except for chronic variable in bowel habits and dysuria.   Current Medications, Allergies, Past Medical History, Past Surgical History, Family History and Social History were reviewed in [employer] record.  Physical Exam: General: Well developed , well nourished, receiving O2 by nasal cannula, no acute distress Head: Normocephalic and atraumatic Eyes:  sclerae anicteric, EOMI Ears: Normal auditory acuity Mouth: No deformity or lesions Lungs: Clear throughout to auscultation Heart: Regular rate and rhythm; no murmurs, rubs or bruits Abdomen: Soft, non tender and non distended. No masses, hepatosplenomegaly or hernias noted. Normal Bowel sounds Musculoskeletal: Symmetrical with no gross deformities  Pulses:  Normal pulses noted Extremities: No clubbing, cyanosis, edema or deformities noted Neurological: Alert oriented x 4, grossly nonfocal Psychological:  Alert and cooperative. Normal mood and affect  Assessment and Recommendations:  1. Recent GI bleed with melena and anemia. No clear source documented. Diverticulosis is a possible source. No further GI evaluation planned. High fiber diet with adequate daily fluid intake. Followup with PCP for anemia and dysuria. No absolute contraindication to restarting anticoagulants however recurrent GI bleeding is a potential risk.

## 2014-08-26 NOTE — Patient Instructions (Addendum)
**Note De-Identified Lameka Disla Obfuscation** Your physician has recommended you make the following change in your medication: take 1 tablet of 150 mg Fluconazole only  Your physician recommends that you return for lab work in: today (urinalysis)  Your physician recommends that you schedule a follow-up appointment in: 2 months.

## 2014-08-26 NOTE — Patient Instructions (Signed)
Follow up with your Primary Care Physician and Cardiologist.  Thank you for choosing me and [facility].  Pricilla Riffle. Dagoberto Ligas., MD., Marval Regal

## 2014-08-26 NOTE — Progress Notes (Signed)
Patient ID: LORETHA URE, female   DOB: 11-29-1941, 73 y.o.   MRN: [id number]      HPI Mrs. Lyster returns today for ongoing evaluation of atrial fibrillation and atrial flutter. She has an extensive past medical history with severe vascular disease including CAD, s/p CABG, peripheral vascular disease with subclavian artery bypass, HTN, and dyslipidemia and diastolic CHF. She was recently hospitalized (admitted on 9/14) with acute on chronic CHF, demand ischemia in the setting of unexplained anemia. She was diuresed 15 lbs, dry weight < 148 lbs. In the interim she developed atrial flutter with an RVR which has been only modestly symptomatic. She was placed on systemic anti-coagulation and started on amiodarone, anticoagulation stopped because of anemia.  DR Lovena Le reviewed her cardiac monitor and she had both atrial fib and flutter and that flutter ablation alone would be unlikely to control her symptoms. She is now following her weight very carefully. She brings her diary with stable weigh 140-142 lbs. She feels well. Her only concern is dysuria since yesterday. No fever. NO orthopnea, no PND, no palpitations or syncope. No CP.    Allergies  Allergen Reactions  . Codeine Phosphate     REACTION: unspecified     Current Outpatient Prescriptions  Medication Sig Dispense Refill  . albuterol (PROVENTIL HFA;VENTOLIN HFA) 108 (90 BASE) MCG/ACT inhaler Inhale 2 puffs into the lungs every 4 (four) hours as needed for wheezing or shortness of breath.      Marland Kitchen amiodarone (PACERONE) 200 MG tablet Take 1 tablet (200 mg total) by mouth daily.  90 tablet  6  . aspirin EC 81 MG EC tablet Take 1 tablet (81 mg total) by mouth daily.  30 tablet  0  . atorvastatin (LIPITOR) 40 MG tablet Take 1 tablet (40 mg total) by mouth every evening.  90 tablet  1  . diltiazem (CARDIZEM CD) 120 MG 24 hr capsule Take 1 capsule by mouth daily.      . feeding supplement, RESOURCE BREEZE, (RESOURCE BREEZE) LIQD Take 1  Container by mouth daily as needed (suboptimal intake).    0  . furosemide (LASIX) 40 MG tablet Take 1 tablet (40 mg total) by mouth daily.  90 tablet  1  . gabapentin (NEURONTIN) 600 MG tablet Take 600 mg by mouth daily as needed. For pain      . glimepiride (AMARYL) 1 MG tablet Take 1 mg by mouth daily with breakfast.      . HYDROcodone-acetaminophen (NORCO/VICODIN) 5-325 MG per tablet Take 0.5 tablets by mouth every 6 (six) hours as needed for moderate pain.  90 tablet  0  . LORazepam (ATIVAN) 0.5 MG tablet Take 0.5 mg by mouth 2 (two) times daily.      . metFORMIN (GLUCOPHAGE) 500 MG tablet Take 1 tablet (500 mg total) by mouth 2 (two) times daily with a meal.  180 tablet  3  . metoprolol (LOPRESSOR) 50 MG tablet Take 25 mg by mouth 2 (two) times daily.       . Multiple Vitamins-Minerals (CENTRUM SILVER ULTRA WOMENS PO) Take 1 capsule by mouth daily.       . nitroGLYCERIN (NITROSTAT) 0.4 MG SL tablet Place 0.4 mg under the tongue every 5 (five) minutes as needed for chest pain.      . potassium chloride (K-DUR) 10 MEQ tablet Take 1 tablet (10 mEq total) by mouth daily.  30 tablet  6  . promethazine (PHENERGAN) 25 MG tablet Take 1 tablet (25 mg total) by  mouth every 6 (six) hours as needed for nausea or vomiting.  20 tablet  2  . sertraline (ZOLOFT) 50 MG tablet Take 25 mg by mouth daily.       Marland Kitchen tiotropium (SPIRIVA) 18 MCG inhalation capsule Place 1 capsule (18 mcg total) into inhaler and inhale daily.  20 capsule  0   No current facility-administered medications for this visit.     Past Medical History  Diagnosis Date  . ANEMIA 08/28/2010  . CAD (coronary artery disease)     a. s/p CABGx4 in 2001.  . Adenomatous colon polyp 2000  . COPD (chronic obstructive pulmonary disease)   . Depressive disorder   . GERD 12/19/2009  . Hyperlipidemia   . Hypertension   . HYPOTENSION 08/11/2010  . Osteoarthritis     Lumbar stenosis  . PVD (peripheral vascular disease)     a. Duplex 03/2013:  stable moderate carotid dz - 78-29% RICA, 56-21% LICA, L subclavian artery to CCA stent widely patent.  b. Prior R fempop bypass graft, R CIA stent.  . Lung abscess 2011  . CHF (congestive heart failure)   . Diverticulosis   . Hiatal hernia     a. Dx 01/2014, placed on Xarelto.  . Aortic stenosis     a. Mild by echo 01/2013.  . Diabetes mellitus without complication     ROS:   All systems reviewed and negative except as noted in the HPI.   Past Surgical History  Procedure Laterality Date  . L subclavian bypass  07/2004  . Tubal ligation    . Coronary artery bypass graft  05/2000    LIMA-LAD, SVG-OM, SVG-PDA-PL  . Femoral popliteal bypass-r  04/1999  . Right common iliac pta with stent placement  07/2000  . Right leg blockage  2003  . Aorta bifemoral bypass graft  2004  . Left cea  2005  . Lung cyst biopsy  2011  . Esophagogastroduodenoscopy N/A 08/03/2014    Procedure: ESOPHAGOGASTRODUODENOSCOPY (EGD);  Surgeon: Juanita Craver, MD;  Location: [facility] ENDOSCOPY;  Service: Endoscopy;  Laterality: N/A;  . Colonoscopy Left 08/05/2014    Procedure: COLONOSCOPY;  Surgeon: Juanita Craver, MD;  Location: [place];  Service: Endoscopy;  Laterality: Left;  Freda Munro capsule study N/A 08/06/2014    Procedure: GIVENS CAPSULE STUDY;  Surgeon: Irene Shipper, MD;  Location: [place];  Service: Endoscopy;  Laterality: N/A;     Family History  Problem Relation Age of Onset  . Diabetes Father   . Heart attack Mother   . Diabetes    . Coronary artery disease    . Colon cancer Neg Hx      History   Social History  . Marital Status: Widowed    Spouse Name: N/A    Number of Children: 2  . Years of Education: N/A   Occupational History  . Receptionist    Social History Main Topics  . Smoking status: Former Smoker -- 1.50 packs/day for 40 years    Types: Cigarettes    Quit date: 06/29/2000  . Smokeless tobacco: Never Used  . Alcohol Use: No  . Drug Use: No  . Sexual Activity: Not on file    Other Topics Concern  . Not on file   Social History Narrative  . No narrative on file     There were no vitals taken for this visit.  Physical Exam:  stable appearing 73 yo woman, NAD HEENT: Unremarkable Neck:  No JVD, no thyromegally Back:  No CVA tenderness Lungs:  Clear with scattered rales. Decreased breath sounds. HEART:  Regular rate rhythm, no murmurs, no rubs, no clicks Abd:  soft, positive bowel sounds, no organomegally, no rebound, no guarding Ext:  2 plus pulses, no edema, no cyanosis, no clubbing Skin:  No rashes no nodules Neuro:  CN II through XII intact, motor grossly intact  EKG - NSR at 65/min.  Echo 04/11/2014  Left ventricle: The cavity size was normal. Wall thickness was normal. Systolic function was normal. The estimated ejection fraction was in the range of 50% to 55%. Moderate hypokinesis of the mid-distalanteroseptal myocardium. Features are consistent with a pseudonormal left ventricular filling pattern, with concomitant abnormal relaxation and increased filling pressure (grade 2 diastolic dysfunction). - Aortic valve: Mildly to moderately calcified annulus. Mildly calcified leaflets. Valve area: 0.96cm^2(VTI). Valve area: 1cm^2 (Vmax). - Mitral valve: Mild regurgitation. - Left atrium: The atrium was severely dilated. - Right ventricle: The cavity size was mildly dilated. Systolic function was mildly reduced. - Pulmonary arteries: Systolic pressure was mildly increased. PA peak pressure: 9mm Hg (S).    Assess/Plan:  1. Acute on chronic diastolic heart failure (EF 50-55%)  - Lasix, 40 mg once a day  - stable since the discharge, weight diary 140-142 LBS  2. Demand ischemia-  - mildly elevated troponin.  - No cardiac catheterization, no symptoms of unstable angina.  - Low-dose aspirin. Continue with beta blocker.   3. COPD  - Chronic, playing a role in her dyspnea.  - Continue with oxygen therapy for now. Continue COPD regimen.    4. Diabetes-holding metformin through hospitalization. Continue with glimiperide and sliding scale.   5. Elevated LFTs  - Stopped fluconazole day 6 of 10 started for candidiasis on EGD. Discontinued fluconazole secondary to elevated liver enzymes. Please see pharmacy note which suggests discontinuation as well.  - Amiodarone, currently in sinus rhythm. Pharmacist felt that amiodarone dose was low and therefore likely not culprit for elevated enzymes, agree  - Statin on hold. If LFTs remain an issue with statins, could consider PCSK 9.  - ALT is improving.  - ? Albumin very low. Could this be acute phase reactant, nutritional?   6. Paroxysmal atrial fibrillation  - Currently sinus rhythm on amiodarone, low-dose  - Maintaining sinus rhythm. Cardizem CD 120 as well.  Her atrial arrhythmias appear to be well-controlled. She will continue low-dose amiodarone therapy. At this point, I would continue this dose of amiodarone. She will not continue anticoagulation because of unexplained GI bleeding, causing severe anemia.  7. Coronary artery disease  - Bypass 2001, stable. Elevated troponin secondary to demand ischemia.   8. Peripheral vascular disease  - Aortobifemoral in 2005, stable. Pletal -stopped given her history of diastolic heart failure and potential bleeding. Continue aspirin if possible.   9. Anemia-GI seeing. She is off of her Xarelto for atrial fibrillation.   10. Dysuria - we will get urinalysis, also I will give her 1 dose of fluconazole 150 mg considering her h/o LFT elevation with 6 days for esophageal candidiasis.  She will call us if her symptoms resolve, also based on urinalysis we might consider bacteria - directed therapy.  Follow up in 2 months.   Dorothy Spark 08/26/2014

## 2014-08-28 ENCOUNTER — Other Ambulatory Visit: Payer: Self-pay | Admitting: Internal Medicine

## 2014-08-28 DIAGNOSIS — K922 Gastrointestinal hemorrhage, unspecified: Secondary | ICD-10-CM

## 2014-08-28 DIAGNOSIS — I248 Other forms of acute ischemic heart disease: Secondary | ICD-10-CM

## 2014-08-28 DIAGNOSIS — J449 Chronic obstructive pulmonary disease, unspecified: Secondary | ICD-10-CM

## 2014-08-28 DIAGNOSIS — I4891 Unspecified atrial fibrillation: Secondary | ICD-10-CM

## 2014-08-29 ENCOUNTER — Telehealth: Payer: Self-pay | Admitting: Critical Care Medicine

## 2014-08-29 NOTE — Telephone Encounter (Signed)
Called and spoke to pt. Informed pt 1 sample has been left up front for pick along with pt assistance form. Advised pt to fill out form and return to our office. Pt verbalized understanding and denied any further questions or concerns at this time.

## 2014-09-02 ENCOUNTER — Telehealth: Payer: Self-pay | Admitting: Internal Medicine

## 2014-09-02 NOTE — Telephone Encounter (Signed)
Left message on voicemail to call office.  

## 2014-09-02 NOTE — Telephone Encounter (Signed)
[name] would like to you to cb w/ a verbal approval if ok.

## 2014-09-02 NOTE — Telephone Encounter (Signed)
[name] occupational therapist is call to report pt is doing well. [name] would like to hold OT until week of 09-16-14. [name] will try to get pt tub bench for shower.

## 2014-09-03 NOTE — Telephone Encounter (Signed)
Spoke to [employer], verbal order given for OT to assist with shower chair teaching when available. Kim verbalized understanding.

## 2014-09-06 ENCOUNTER — Encounter: Payer: Self-pay | Admitting: Gastroenterology

## 2014-09-09 ENCOUNTER — Other Ambulatory Visit: Payer: Self-pay | Admitting: *Deleted

## 2014-09-09 DIAGNOSIS — I4892 Unspecified atrial flutter: Secondary | ICD-10-CM

## 2014-09-09 MED ORDER — DILTIAZEM HCL ER COATED BEADS 120 MG PO CP24: 120.0000 mg | ORAL_CAPSULE | Freq: Every day | ORAL | Status: DC

## 2014-09-10 ENCOUNTER — Ambulatory Visit (INDEPENDENT_AMBULATORY_CARE_PROVIDER_SITE_OTHER): Payer: Medicare Other | Admitting: Internal Medicine

## 2014-09-10 ENCOUNTER — Encounter: Payer: Self-pay | Admitting: Internal Medicine

## 2014-09-10 VITALS — BP 150/60 | HR 59 | Temp 97.8°F | Resp 20 | Ht 62.5 in | Wt 141.0 lb

## 2014-09-10 DIAGNOSIS — I480 Paroxysmal atrial fibrillation: Secondary | ICD-10-CM

## 2014-09-10 DIAGNOSIS — I10 Essential (primary) hypertension: Secondary | ICD-10-CM

## 2014-09-10 DIAGNOSIS — Z23 Encounter for immunization: Secondary | ICD-10-CM

## 2014-09-10 DIAGNOSIS — E119 Type 2 diabetes mellitus without complications: Secondary | ICD-10-CM

## 2014-09-10 DIAGNOSIS — I5020 Unspecified systolic (congestive) heart failure: Secondary | ICD-10-CM

## 2014-09-10 LAB — HEMOGLOBIN A1C: Hgb A1c MFr Bld: 5.9 % (ref 4.6–6.5)

## 2014-09-10 NOTE — Patient Instructions (Signed)
Please check your hemoglobin A1c every 3 months  Limit your sodium (Salt) intake

## 2014-09-10 NOTE — Progress Notes (Signed)
Pre visit review using our clinic review tool, if applicable. No additional management support is needed unless otherwise documented below in the visit note. 

## 2014-09-10 NOTE — Progress Notes (Signed)
Subjective:    Patient ID: [name], female    DOB: 1941/02/04, 73 y.o.   MRN: [id number]  HPI 73 year old patient who is seen today for followup.  She has type 2 diabetes, controlled on oral medications.  Fasting blood sugars are checked in the morning and are always less than 100.  No symptomatic hypoglycemia.  Medical regimen includes 1 mg of Amaryl daily She has treated hypertension and a history of paroxysmal atrial fibrillation.  Her cardiopulmonary status has been stable.  She is on chronic nasal cannula O2  Past Medical History  Diagnosis Date  . ANEMIA 08/28/2010  . CAD (coronary artery disease)     a. s/p CABGx4 in 2001.  . Adenomatous colon polyp 2000  . COPD (chronic obstructive pulmonary disease)   . Depressive disorder   . GERD 12/19/2009  . Hyperlipidemia   . Hypertension   . HYPOTENSION 08/11/2010  . Osteoarthritis     Lumbar stenosis  . PVD (peripheral vascular disease)     a. Duplex 03/2013: stable moderate carotid dz - 74-25% RICA, 95-63% LICA, L subclavian artery to CCA stent widely patent.  b. Prior R fempop bypass graft, R CIA stent.  . Lung abscess 2011  . CHF (congestive heart failure)   . Diverticulosis   . Hiatal hernia     a. Dx 01/2014, placed on Xarelto.  . Aortic stenosis     a. Mild by echo 01/2013.  . Diabetes mellitus without complication     History   Social History  . Marital Status: Widowed    Spouse Name: N/A    Number of Children: 2  . Years of Education: N/A   Occupational History  . Receptionist    Social History Main Topics  . Smoking status: Former Smoker -- 1.50 packs/day for 40 years    Types: Cigarettes    Quit date: 06/29/2000  . Smokeless tobacco: Never Used  . Alcohol Use: No  . Drug Use: No  . Sexual Activity: Not on file   Other Topics Concern  . Not on file   Social History Narrative  . No narrative on file    Past Surgical History  Procedure Laterality Date  . L subclavian bypass  07/2004  . Tubal  ligation    . Coronary artery bypass graft  05/2000    LIMA-LAD, SVG-OM, SVG-PDA-PL  . Femoral popliteal bypass-r  04/1999  . Right common iliac pta with stent placement  07/2000  . Right leg blockage  2003  . Aorta bifemoral bypass graft  2004  . Left cea  2005  . Lung cyst biopsy  2011  . Esophagogastroduodenoscopy N/A 08/03/2014    Procedure: ESOPHAGOGASTRODUODENOSCOPY (EGD);  Surgeon: Juanita Craver, MD;  Location: [facility] ENDOSCOPY;  Service: Endoscopy;  Laterality: N/A;  . Colonoscopy Left 08/05/2014    Procedure: COLONOSCOPY;  Surgeon: Juanita Craver, MD;  Location: [facility];  Service: Endoscopy;  Laterality: Left;  Freda Munro capsule study N/A 08/06/2014    Procedure: GIVENS CAPSULE STUDY;  Surgeon: Irene Shipper, MD;  Location: [facility];  Service: Endoscopy;  Laterality: N/A;    Family History  Problem Relation Age of Onset  . Diabetes Father   . Heart attack Mother   . Diabetes    . Coronary artery disease    . Colon cancer Neg Hx     Allergies  Allergen Reactions  . Codeine Phosphate     REACTION: unspecified    Current Outpatient Prescriptions on File  Prior to Visit  Medication Sig Dispense Refill  . albuterol (PROVENTIL HFA;VENTOLIN HFA) 108 (90 BASE) MCG/ACT inhaler Inhale 2 puffs into the lungs every 4 (four) hours as needed for wheezing or shortness of breath.      Marland Kitchen amiodarone (PACERONE) 200 MG tablet Take 1 tablet (200 mg total) by mouth daily.  90 tablet  6  . aspirin EC 81 MG EC tablet Take 1 tablet (81 mg total) by mouth daily.  30 tablet  0  . atorvastatin (LIPITOR) 40 MG tablet Take 1 tablet (40 mg total) by mouth every evening.  90 tablet  1  . diltiazem (CARDIZEM CD) 120 MG 24 hr capsule Take 1 capsule (120 mg total) by mouth daily.  30 capsule  11  . feeding supplement, RESOURCE BREEZE, (RESOURCE BREEZE) LIQD Take 1 Container by mouth daily as needed (suboptimal intake).    0  . fluconazole (DIFLUCAN) 150 MG tablet Take 1 tablet (150 mg total) by mouth daily.  1  tablet  0  . furosemide (LASIX) 40 MG tablet Take 1 tablet (40 mg total) by mouth daily.  90 tablet  1  . gabapentin (NEURONTIN) 600 MG tablet Take 600 mg by mouth daily as needed. For pain      . gabapentin (NEURONTIN) 600 MG tablet TAKE 1 TABLET (600 MG TOTAL) BY MOUTH 3 (THREE) TIMES DAILY.  270 tablet  3  . glimepiride (AMARYL) 1 MG tablet Take 1 mg by mouth daily with breakfast.      . HYDROcodone-acetaminophen (NORCO/VICODIN) 5-325 MG per tablet Take 0.5 tablets by mouth every 6 (six) hours as needed for moderate pain.  90 tablet  0  . LORazepam (ATIVAN) 0.5 MG tablet Take 0.5 mg by mouth 2 (two) times daily.      . metFORMIN (GLUCOPHAGE) 500 MG tablet Take 1 tablet (500 mg total) by mouth 2 (two) times daily with a meal.  180 tablet  3  . metoprolol (LOPRESSOR) 50 MG tablet Take 25 mg by mouth 2 (two) times daily.       . Multiple Vitamins-Minerals (CENTRUM SILVER ULTRA WOMENS PO) Take 1 capsule by mouth daily.       . nitroGLYCERIN (NITROSTAT) 0.4 MG SL tablet Place 0.4 mg under the tongue every 5 (five) minutes as needed for chest pain.      . potassium chloride (K-DUR) 10 MEQ tablet Take 1 tablet (10 mEq total) by mouth daily.  30 tablet  6  . promethazine (PHENERGAN) 25 MG tablet Take 1 tablet (25 mg total) by mouth every 6 (six) hours as needed for nausea or vomiting.  20 tablet  2  . sertraline (ZOLOFT) 50 MG tablet Take 25 mg by mouth daily.       Marland Kitchen tiotropium (SPIRIVA) 18 MCG inhalation capsule Place 1 capsule (18 mcg total) into inhaler and inhale daily.  20 capsule  0   No current facility-administered medications on file prior to visit.    BP 150/60  Pulse 59  Temp(Src) 97.8 F (36.6 C) (Oral)  Resp 20  Ht 5' 2.5" (1.588 m)  Wt 141 lb (63.957 kg)  BMI 25.36 kg/m2  SpO2 97%      Review of Systems  Constitutional: Negative.   HENT: Negative for congestion, dental problem, hearing loss, rhinorrhea, sinus pressure, sore throat and tinnitus.   Eyes: Negative for pain,  discharge and visual disturbance.  Respiratory: Positive for shortness of breath. Negative for cough.   Cardiovascular: Negative for chest pain,  palpitations and leg swelling.  Gastrointestinal: Negative for nausea, vomiting, abdominal pain, diarrhea, constipation, blood in stool and abdominal distention.  Genitourinary: Negative for dysuria, urgency, frequency, hematuria, flank pain, vaginal bleeding, vaginal discharge, difficulty urinating, vaginal pain and pelvic pain.  Musculoskeletal: Negative for arthralgias, gait problem and joint swelling.  Skin: Negative for rash.  Neurological: Negative for dizziness, syncope, speech difficulty, weakness, numbness and headaches.  Hematological: Negative for adenopathy.  Psychiatric/Behavioral: Negative for behavioral problems, dysphoric mood and agitation. The patient is not nervous/anxious.        Objective:   Physical Exam  Constitutional: She is oriented to person, place, and time. She appears well-developed and well-nourished.  HENT:  Head: Normocephalic.  Right Ear: External ear normal.  Left Ear: External ear normal.  Mouth/Throat: Oropharynx is clear and moist.  Eyes: Conjunctivae and EOM are normal. Pupils are equal, round, and reactive to light.  Neck: Normal range of motion. Neck supple. No thyromegaly present.  Cardiovascular: Normal rate and regular rhythm.   Murmur heard. Grade 3 over 6 systolic murmur  Pulmonary/Chest: Effort normal and breath sounds normal.  Nasal cannula O2 in place O2 saturation 97  Abdominal: Soft. Bowel sounds are normal. She exhibits no mass. There is no tenderness.  Musculoskeletal: Normal range of motion. She exhibits no edema.  Lymphadenopathy:    She has no cervical adenopathy.  Neurological: She is alert and oriented to person, place, and time.  Skin: Skin is warm and dry. No rash noted.  Psychiatric: She has a normal mood and affect. Her behavior is normal.          Assessment & Plan:    Diabetes mellitus.  We'll check a hemoglobin A1c.  Consider discontinuation of Amaryl.  Continue metformin Coronary artery disease COPD  Check a hemoglobin A1c No change in medications Recheck in 3 months

## 2014-09-10 NOTE — Progress Notes (Signed)
   Subjective:    Patient ID: [name], female    DOB: 10/23/1941, 73 y.o.   MRN: [id number]  HPI Lab Results  Component Value Date   HGBA1C 6.6* 04/10/2014     Review of Systems     Objective:   Physical Exam        Assessment & Plan:

## 2014-09-11 ENCOUNTER — Telehealth: Payer: Self-pay | Admitting: Internal Medicine

## 2014-09-11 NOTE — Telephone Encounter (Signed)
emmi emailed °

## 2014-09-13 ENCOUNTER — Telehealth: Payer: Self-pay | Admitting: Internal Medicine

## 2014-09-13 MED ORDER — TIOTROPIUM BROMIDE MONOHYDRATE 18 MCG IN CAPS: 18.0000 ug | ORAL_CAPSULE | Freq: Every day | RESPIRATORY_TRACT | Status: DC

## 2014-09-13 NOTE — Telephone Encounter (Signed)
Spoke to pt, told her will send Rx to pharmacy and will mail card to check stool and mail back when done or bring next visit. Pt verbalized udnerstanding. Rx sent and Hemoccult card mailed.

## 2014-09-13 NOTE — Telephone Encounter (Signed)
Pt req an rx on Spiriva she said she was getting samples from Dr Joya Gaskins. She also would like to know if she can do a stool card she want to make sure there is no blood in her stool.Marland Kitchen

## 2014-09-14 ENCOUNTER — Other Ambulatory Visit: Payer: Self-pay | Admitting: Internal Medicine

## 2014-10-13 ENCOUNTER — Other Ambulatory Visit: Payer: Self-pay | Admitting: Internal Medicine

## 2014-10-14 ENCOUNTER — Encounter: Payer: Self-pay | Admitting: Vascular Surgery

## 2014-10-15 ENCOUNTER — Encounter: Payer: Self-pay | Admitting: Vascular Surgery

## 2014-10-15 ENCOUNTER — Ambulatory Visit (INDEPENDENT_AMBULATORY_CARE_PROVIDER_SITE_OTHER): Payer: Medicare Other | Admitting: Vascular Surgery

## 2014-10-15 ENCOUNTER — Ambulatory Visit (HOSPITAL_COMMUNITY)
Admission: RE | Admit: 2014-10-15 | Discharge: 2014-10-15 | Disposition: A | Discharge status: Home / Self Care | Payer: Medicare Other | Source: Ambulatory Visit | Attending: Vascular Surgery | Admitting: Vascular Surgery

## 2014-10-15 VITALS — BP 138/56 | HR 50 | Resp 14 | Ht 62.5 in | Wt 137.0 lb

## 2014-10-15 DIAGNOSIS — I6523 Occlusion and stenosis of bilateral carotid arteries: Secondary | ICD-10-CM | POA: Diagnosis not present

## 2014-10-15 NOTE — Progress Notes (Signed)
Subjective:     Patient ID: [name], female   DOB: December 03, 1940, 73 y.o.   MRN: [id number]  HPI this 73 year old female returns for continued follow-up regarding her carotid occlusive disease. She has had multiple vascular procedures and the plastic including aortobifemoral bypass graft Left carotid subclavian bypass, right femoral-popliteal bypass graft and redo right femoral-popliteal bypass graft-all by Dr. Amedeo Plenty. She has none moderate carotid occlusive disease. She recently was in the hospital and required oxygen therapy because of some syncope there was no change in her carotid occlusive disease. She returns today for follow-up. She denies any lateralizing weakness, aphasia, amaurosis fugax, diplopia, or syncope. She is doing well she states since she started the oxygen therapy. She also denies lower extremity ischemia symptoms. She does not ambulate very far because of her dyspnea on exertion.  Past Medical History  Diagnosis Date  . ANEMIA 08/28/2010  . CAD (coronary artery disease)     a. s/p CABGx4 in 2001.  . Adenomatous colon polyp 2000  . COPD (chronic obstructive pulmonary disease)   . Depressive disorder   . GERD 12/19/2009  . Hyperlipidemia   . Hypertension   . HYPOTENSION 08/11/2010  . Osteoarthritis     Lumbar stenosis  . PVD (peripheral vascular disease)     a. Duplex 03/2013: stable moderate carotid dz - 29-52% RICA, 84-13% LICA, L subclavian artery to CCA stent widely patent.  b. Prior R fempop bypass graft, R CIA stent.  . Lung abscess 2011  . CHF (congestive heart failure)   . Diverticulosis   . Hiatal hernia     a. Dx 01/2014, placed on Xarelto.  . Aortic stenosis     a. Mild by echo 01/2013.  . Diabetes mellitus without complication     History  Substance Use Topics  . Smoking status: Former Smoker -- 1.50 packs/day for 40 years    Types: Cigarettes    Quit date: 06/29/2000  . Smokeless tobacco: Never Used  . Alcohol Use: No    Family History  Problem  Relation Age of Onset  . Diabetes Father   . Heart attack Mother   . Diabetes    . Coronary artery disease    . Colon cancer Neg Hx     Allergies  Allergen Reactions  . Codeine Phosphate     REACTION: unspecified    Current outpatient prescriptions: albuterol (PROVENTIL HFA;VENTOLIN HFA) 108 (90 BASE) MCG/ACT inhaler, Inhale 2 puffs into the lungs every 4 (four) hours as needed for wheezing or shortness of breath., Disp: , Rfl: ;  amiodarone (PACERONE) 200 MG tablet, Take 1 tablet (200 mg total) by mouth daily., Disp: 90 tablet, Rfl: 6;  aspirin EC 81 MG EC tablet, Take 1 tablet (81 mg total) by mouth daily., Disp: 30 tablet, Rfl: 0 atorvastatin (LIPITOR) 40 MG tablet, Take 1 tablet (40 mg total) by mouth every evening., Disp: 90 tablet, Rfl: 1;  diltiazem (CARDIZEM CD) 120 MG 24 hr capsule, Take 1 capsule (120 mg total) by mouth daily., Disp: 30 capsule, Rfl: 11;  fluconazole (DIFLUCAN) 150 MG tablet, Take 1 tablet (150 mg total) by mouth daily., Disp: 1 tablet, Rfl: 0;  furosemide (LASIX) 40 MG tablet, Take 1 tablet (40 mg total) by mouth daily., Disp: 90 tablet, Rfl: 1 gabapentin (NEURONTIN) 600 MG tablet, Take 600 mg by mouth daily as needed. For pain, Disp: , Rfl: ;  gabapentin (NEURONTIN) 600 MG tablet, TAKE 1 TABLET (600 MG TOTAL) BY MOUTH 3 (THREE) TIMES  DAILY., Disp: 270 tablet, Rfl: 3;  glimepiride (AMARYL) 1 MG tablet, Take 1 mg by mouth daily with breakfast., Disp: , Rfl:  HYDROcodone-acetaminophen (NORCO/VICODIN) 5-325 MG per tablet, Take 0.5 tablets by mouth every 6 (six) hours as needed for moderate pain., Disp: 90 tablet, Rfl: 0;  LORazepam (ATIVAN) 0.5 MG tablet, Take 0.5 mg by mouth 2 (two) times daily., Disp: , Rfl: ;  metFORMIN (GLUCOPHAGE) 500 MG tablet, Take 1 tablet (500 mg total) by mouth 2 (two) times daily with a meal., Disp: 180 tablet, Rfl: 3 metoprolol (LOPRESSOR) 50 MG tablet, Take 25 mg by mouth 2 (two) times daily. , Disp: , Rfl: ;  Multiple Vitamins-Minerals  (CENTRUM SILVER ULTRA WOMENS PO), Take 1 capsule by mouth daily. , Disp: , Rfl: ;  nitroGLYCERIN (NITROSTAT) 0.4 MG SL tablet, Place 0.4 mg under the tongue every 5 (five) minutes as needed for chest pain., Disp: , Rfl:  potassium chloride (K-DUR) 10 MEQ tablet, Take 1 tablet (10 mEq total) by mouth daily., Disp: 30 tablet, Rfl: 6;  promethazine (PHENERGAN) 25 MG tablet, Take 1 tablet (25 mg total) by mouth every 6 (six) hours as needed for nausea or vomiting., Disp: 20 tablet, Rfl: 2;  sertraline (ZOLOFT) 50 MG tablet, Take 25 mg by mouth daily. , Disp: , Rfl: ;  sertraline (ZOLOFT) 50 MG tablet, TAKE 1 TABLET (50 MG TOTAL) BY MOUTH DAILY., Disp: 90 tablet, Rfl: 1 tiotropium (SPIRIVA) 18 MCG inhalation capsule, Place 1 capsule (18 mcg total) into inhaler and inhale daily., Disp: 30 capsule, Rfl: 2;  feeding supplement, RESOURCE BREEZE, (RESOURCE BREEZE) LIQD, Take 1 Container by mouth daily as needed (suboptimal intake)., Disp: , Rfl: 0  BP 138/56 mmHg  Pulse 50  Resp 14  Ht 5' 2.5" (1.588 m)  Wt 137 lb (62.143 kg)  BMI 24.64 kg/m2  Body mass index is 24.64 kg/(m^2).          Review of SystemsMultiple symptoms including dyspnea on exertion, leg pain with walking, orthopnea, leg edema, varicose veins, wheezing, occasional dizziness when not on oxygen. Has history of coronary artery bypass grafting and multiple lower extremity revascularizations. Other systems negative and complete review of systems     Objective:   Physical Exam BP 138/56 mmHg  Pulse 50  Resp 14  Ht 5' 2.5" (1.588 m)  Wt 137 lb (62.143 kg)  BMI 24.64 kg/m2  Gen.-alert and oriented x3 in no apparent distress-on nasal oxygen HEENT normal for age Lungs no rhonchi or wheezing Cardiovascular regular rhythm-Systolic ejection murmurcarotid pulses 3+ palpable -Bilateral soft carotid bruits Abdomen soft nontender no palpable masses Musculoskeletal free of  major deformities Skin clear -no rashes Neurologic normal Lower  extremities 3+ femoral pulses palpable bilaterally with no evidence of ischemia distally. No palpable distal pulses.  Today I ordered a carotid duplex exam which I reviewed and interpreted area also compared this to previous studies. She continues to have an approximate 60-70% bilateral ICA stenosis left worse than right and her left carotid subclavian bypass is patent.       Assessment:     Stable carotid occlusive disease which currently is asymptomatic     Plan:     Return in one year for follow-up carotid duplex exam and bilateral ABIs unless develops symptoms in the interim

## 2014-10-21 ENCOUNTER — Other Ambulatory Visit: Payer: Self-pay | Admitting: Internal Medicine

## 2014-10-22 ENCOUNTER — Other Ambulatory Visit (HOSPITAL_COMMUNITY): Payer: Medicare Other

## 2014-10-22 ENCOUNTER — Ambulatory Visit: Payer: Medicare Other | Admitting: Vascular Surgery

## 2014-10-23 ENCOUNTER — Other Ambulatory Visit: Payer: Self-pay | Admitting: Internal Medicine

## 2014-10-29 ENCOUNTER — Ambulatory Visit: Payer: Medicare Other | Admitting: Cardiology

## 2014-10-30 ENCOUNTER — Other Ambulatory Visit (INDEPENDENT_AMBULATORY_CARE_PROVIDER_SITE_OTHER): Payer: Medicare Other

## 2014-10-30 ENCOUNTER — Encounter: Payer: Self-pay | Admitting: Critical Care Medicine

## 2014-10-30 ENCOUNTER — Ambulatory Visit (INDEPENDENT_AMBULATORY_CARE_PROVIDER_SITE_OTHER): Payer: Medicare Other | Admitting: Critical Care Medicine

## 2014-10-30 VITALS — BP 156/60 | HR 52 | Temp 96.5°F | Ht 62.5 in | Wt 140.6 lb

## 2014-10-30 DIAGNOSIS — J4489 Other specified chronic obstructive pulmonary disease: Secondary | ICD-10-CM

## 2014-10-30 DIAGNOSIS — D500 Iron deficiency anemia secondary to blood loss (chronic): Secondary | ICD-10-CM

## 2014-10-30 DIAGNOSIS — J449 Chronic obstructive pulmonary disease, unspecified: Secondary | ICD-10-CM

## 2014-10-30 DIAGNOSIS — R7981 Abnormal blood-gas level: Secondary | ICD-10-CM

## 2014-10-30 LAB — CBC
HEMATOCRIT: 34.1 % — AB (ref 36.0–46.0)
HEMOGLOBIN: 11.2 g/dL — AB (ref 12.0–15.0)
MCHC: 32.9 g/dL (ref 30.0–36.0)
MCV: 84.3 fl (ref 78.0–100.0)
Platelets: 206 10*3/uL (ref 150.0–400.0)
RBC: 4.05 Mil/uL (ref 3.87–5.11)
RDW: 22.6 % — ABNORMAL HIGH (ref 11.5–15.5)
WBC: 8 10*3/uL (ref 4.0–10.5)

## 2014-10-30 MED ORDER — TIOTROPIUM BROMIDE MONOHYDRATE 18 MCG IN CAPS: 18.0000 ug | ORAL_CAPSULE | Freq: Every day | RESPIRATORY_TRACT | Status: DC

## 2014-10-30 NOTE — Progress Notes (Signed)
Subjective:    Patient ID: [name], female    DOB: 1941/09/10, 73 y.o.   MRN: [id number]  HPI 10/30/2014 Chief Complaint  Patient presents with  . 4 month follow up    Was started on o2 during Sept 2015 hospital admission at [facility].  Breathing doing well overall.  No wheezing, chest tightness/CP, or cough.  Pt in hosp twice 07/2014, fell twice . Started on oxygen. Pt stopped xarelot/pletal but cont asa.  GI bleed and heart failure. Hgb 5>>8.6  tfx two prbc.   EGD and colonoscopy neg.  Capsule endoscopy neg.  Xarelto was for CVA issue. Now is off. No further black stools, had this before.  Dyspnea is better.  No real cough.  No edema in feet.  Trop was high, no cath done.  No mucus. No wheezing. Pt denies any significant sore throat, nasal congestion or excess secretions, fever, chills, sweats, unintended weight loss, pleurtic or exertional chest pain, orthopnea PND, or leg swelling Pt denies any increase in rescue therapy over baseline, denies waking up needing it or having any early am or nocturnal exacerbations of coughing/wheezing/or dyspnea. Pt also denies any obvious fluctuation in symptoms with  weather or environmental change or other alleviating or aggravating factors 2Liters 24/7. [facility]  Review of Systems Constitutional:   No  weight loss, night sweats,  Fevers, chills, fatigue, lassitude. HEENT:   No headaches,  Difficulty swallowing,  Tooth/dental problems,  Sore throat,                No sneezing, itching, ear ache, nasal congestion, post nasal drip,   CV:  No chest pain,  Orthopnea, PND, swelling in lower extremities, anasarca, dizziness, palpitations  GI  No heartburn, indigestion, abdominal pain, nausea, vomiting, diarrhea, change in bowel habits, loss of appetite  Resp: No shortness of breath with exertion or at rest.  No excess mucus, no productive cough,  No non-productive cough,  No coughing up of blood.  No change in color of mucus.  No wheezing.  No chest wall  deformity  Skin: no rash or lesions.  GU: no dysuria, change in color of urine, no urgency or frequency.  No flank pain.  MS:  No joint pain or swelling.  No decreased range of motion.  No back pain.  Psych:  No change in mood or affect. No depression or anxiety.  No memory loss.     Objective:   Physical Exam  Filed Vitals:   10/30/14 1015  BP: 156/60  Pulse: 52  Temp: 96.5 F (35.8 C)  TempSrc: Oral  Height: 5' 2.5" (1.588 m)  Weight: 140 lb 9.6 oz (63.776 kg)  SpO2: 99%    Gen: Pleasant, well-nourished, in no distress,  normal affect  ENT: No lesions,  mouth clear,  oropharynx clear, no postnasal drip  Neck: No JVD, no TMG, no carotid bruits  Lungs: No use of accessory muscles, no dullness to percussion, distant breath sounds  Cardiovascular: RRR, heart sounds normal, no murmur or gallops, no peripheral edema  Abdomen: soft and NT, no HSM,  BS normal  Musculoskeletal: No deformities, no cyanosis or clubbing  Neuro: alert, non focal  Skin: Warm, no lesions or rashes  No results found.       Assessment & Plan:   COPD with chronic bronchitis Gold stage B. 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 albuterol (PROVENTIL HFA;VENTOLIN HFA) 108 (90 BASE) MCG/ACT inhaler Inhale 2 puffs into the lungs every 4 (four) hours as needed for wheezing or shortness of breath.  Marland Kitchen amiodarone (PACERONE) 200 MG tablet TAKE 2 TABLET (400MG ) BY MOUTH DAILY  . aspirin EC 81 MG EC tablet  Take 1 tablet (81 mg total) by mouth daily.  Marland Kitchen atorvastatin (LIPITOR) 40 MG tablet Take 1 tablet (40 mg total) by mouth every evening.  . diltiazem (CARDIZEM CD) 120 MG 24 hr capsule Take 1 capsule (120 mg total) by mouth daily.  . furosemide (LASIX) 40 MG tablet Take 1 tablet (40 mg total) by mouth daily.  Marland Kitchen gabapentin (NEURONTIN) 600 MG tablet TAKE 1 TABLET (600 MG TOTAL) BY MOUTH 3 (THREE) TIMES DAILY.  Marland Kitchen glimepiride (AMARYL) 1 MG tablet Take 1 mg by mouth daily with breakfast.  . HYDROcodone-acetaminophen (NORCO/VICODIN) 5-325 MG per tablet Take 0.5 tablets by mouth every 6 (six) hours as needed for moderate pain.  Marland Kitchen KLOR-CON 10 10 MEQ tablet TAKE 1 TABLET (10 MEQ TOTAL) BY MOUTH DAILY.  Marland Kitchen LORazepam (ATIVAN) 0.5 MG tablet Take 0.5 mg by mouth 2 (two) times daily.  . metFORMIN (GLUCOPHAGE) 500 MG tablet Take 1 tablet (500 mg total) by mouth 2 (two) times daily with a meal.  . metoprolol (LOPRESSOR) 50 MG tablet Take 25 mg by mouth 2 (two) times daily.   . Multiple Vitamins-Minerals (CENTRUM SILVER ULTRA WOMENS PO) Take 1 capsule by mouth daily.   . nitroGLYCERIN (NITROSTAT) 0.4 MG SL tablet Place 0.4 mg under the tongue every 5 (five) minutes as needed for chest pain.  . promethazine (PHENERGAN) 25 MG tablet Take 1 tablet (25 mg total) by mouth every 6 (six) hours as needed for nausea or vomiting.  . sertraline (ZOLOFT) 50 MG tablet TAKE 1 TABLET (50 MG TOTAL) BY MOUTH DAILY. (Patient taking differently: TAKE 1/2 TABLET (25 MG TOTAL) BY MOUTH DAILY.)  . tiotropium (SPIRIVA) 18 MCG inhalation capsule Place 1 capsule (18 mcg total) into inhaler and inhale daily.  . [DISCONTINUED] gabapentin (NEURONTIN) 600 MG tablet Take 600 mg by mouth daily as needed. For pain  . [DISCONTINUED] sertraline (ZOLOFT) 50 MG tablet Take 25 mg by mouth daily.   . [DISCONTINUED] tiotropium (SPIRIVA) 18 MCG inhalation capsule Place 1 capsule (18 mcg total) into inhaler and inhale daily.  Marland Kitchen amiodarone (PACERONE) 200 MG  tablet Take 1 tablet (200 mg total) by mouth daily. (Patient not taking: Reported on 10/30/2014)  . [DISCONTINUED] feeding supplement, RESOURCE BREEZE, (RESOURCE BREEZE) LIQD Take 1 Container by mouth daily as needed (suboptimal intake). (Patient not taking: Reported on 10/30/2014)  . [DISCONTINUED] fluconazole (DIFLUCAN) 150 MG tablet Take 1 tablet (150 mg total) by mouth daily. (Patient not taking: Reported on 10/30/2014)

## 2014-10-30 NOTE — Patient Instructions (Signed)
You can be off oxygen daytime, use 2Liters at night CBC today No other changes Discuss with primary care about the neurontin dosing  Return 3 months

## 2014-10-30 NOTE — Progress Notes (Signed)
Quick Note:  Call pt and tell her labs are ok, No change in medications Hemaglobin only slightly low at 11.2 No change in 10months therefore pt is NOT NOW BLEEDING ______

## 2014-10-31 ENCOUNTER — Encounter: Payer: Self-pay | Admitting: Neurology

## 2014-10-31 ENCOUNTER — Ambulatory Visit (INDEPENDENT_AMBULATORY_CARE_PROVIDER_SITE_OTHER): Payer: Medicare Other | Admitting: Neurology

## 2014-10-31 VITALS — BP 129/44 | HR 51 | Ht 62.5 in | Wt 138.2 lb

## 2014-10-31 DIAGNOSIS — I739 Peripheral vascular disease, unspecified: Secondary | ICD-10-CM

## 2014-10-31 DIAGNOSIS — I480 Paroxysmal atrial fibrillation: Secondary | ICD-10-CM

## 2014-10-31 DIAGNOSIS — E785 Hyperlipidemia, unspecified: Secondary | ICD-10-CM

## 2014-10-31 DIAGNOSIS — I639 Cerebral infarction, unspecified: Secondary | ICD-10-CM

## 2014-10-31 DIAGNOSIS — I779 Disorder of arteries and arterioles, unspecified: Secondary | ICD-10-CM

## 2014-10-31 DIAGNOSIS — I10 Essential (primary) hypertension: Secondary | ICD-10-CM

## 2014-10-31 DIAGNOSIS — I6340 Cerebral infarction due to embolism of unspecified cerebral artery: Secondary | ICD-10-CM

## 2014-10-31 NOTE — Progress Notes (Signed)
Quick Note:  Called, spoke with pt. Discussed results and recs per Dr. Wright. She verbalized understanding and voiced no further questions or concerns at this time. ______ 

## 2014-10-31 NOTE — Assessment & Plan Note (Addendum)
Gold stage B. 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

## 2014-10-31 NOTE — Patient Instructions (Addendum)
-   continue ASA and lipitor for now for stroke prevention - please discuss with Dr. Meda Coffee tomorrow regarding whether she should resume the anticoagulation for stroke prevention due to Afib and the timing of resuming anticoagulation. - if she is OK to resume, I would recommend eliquis since it carries the least GI bleeding risk among newer agents. - Also please discuss with Dr. Meda Coffee what dose she would like you to take, 2.5mg  or 5 mg twice a day.  - Follow up with your primary care physician for stroke risk factor modification. Recommend maintain blood pressure goal <130/80, diabetes with hemoglobin A1c goal below 6.5% and lipids with LDL cholesterol goal below 70 mg/dL.  - check BP and glucose at home - follow up with Dr. Kellie Simmering as scheduled. - follow up in 6 months.

## 2014-10-31 NOTE — Progress Notes (Signed)
STROKE NEUROLOGY FOLLOW UP NOTE  NAME: [name] DOB: [birth year]  REASON FOR VISIT: stroke follow up HISTORY FROM: pt and charts  Today we had the pleasure of seeing [name] in follow-up at our Neurology Clinic. Pt was accompanied by daughter.   History Summary [name] is a 73 year old right-handed woman with PMH of CAD s/p stent, CHF, PVD, HTN, HLD, DM, COPD who was admitted on 04/09/14 for an episode of syncope, headache, slurry speech and fall. CT Head was unremarkable. MRI Brain revealed acute punctate right parietal lobe infarct. She was found to have an episode of A. fib/flutter with RVR and asssociated hypotension. Cardiology started amiodarone, while Cardizem was discontinued and metoprolol was decreased. She had already previously had a carotid doppler performed on 04/04/14, which revealed 40-59% RICA stenosis and 27-78% LICA with patent vertebral arteries. Vascular surgery ordered CTA of the neck, showing at least 90% high-grade complex plaque involving the proximal innominate artery with extensive calcification and irregularity in the right ICA, and 75-80% calcific stenosis of the left ICA. Vascular surgery recommended no intervention for the right ICA. Follow-up was recommended in 6 months to re-evaluate the left ICA. She was continued on Xarelto and aspirin was discontinued. For the new-onset diabetes, she was started on Amaryl.   Follow up 08/01/14 (LL) - She is tolerating Xarelto well with no signs of significant bleeding or bruising. Daughter states her mother has been weaker than normal this week. She fell on Sunday in her kitchen; she states she is not sure what happened but her right leg tends to be weaker than the left; she was not hurt and did not hit her head. She is having trouble with her balance at times. She was found to have dark stool and anemia with plan to see GI the second day.  Interval History During the interval time, the patient has been doing  gradually better. She was admitted the same day after last visit due to acute GI bleeding and severe anemia with Hb down to 7.2. Colonoscopy was done found to have diverticulosis and one single polyp which was resected. Other GI work up did not find other source of bleeding and followed up with GI stated "No absolute contraindication to restarting anticoagulants however recurrent GI bleeding is a potential risk". Her Hb is trending up last check was 11.2 yesterday.   She also followed up with cardiology for afib and CHF, currently on lasix, amiodarone, cardiazem, still holding for anticoagulation due to GIB.  She also followed up with vascular surgery and repeat CUS on 10/15/14 and it showed an approximate 60-70% bilateral ICA stenosis left worse than right and her left carotid subclavian bypass is patent, which was stable compared with previous studies. She was asked to follow up in one year.  She also followed up with pulmonology and doing well so her O2 was taken off during the day and only used at night.  She also followed up with her PCP and her A1C was 5.9 on 09/10/14.   Otherwise, she is feeling better gradually, more energy than before. In clinic today, she is conversing well and BP 129/44. She is still on ASA. No more melena.   REVIEW OF SYSTEMS: Full 14 system review of systems performed and notable only for those listed below and in HPI above, all others are negative:  Constitutional: N/A  Cardiovascular: N/A  Ear/Nose/Throat: N/A  Skin: N/A  Eyes: N/A  Respiratory: N/A  Gastroitestinal: N/A  Genitourinary: N/A Hematology/Lymphatic: N/A  Endocrine: N/A  Musculoskeletal: N/A  Allergy/Immunology: N/A  Neurological: N/A  Psychiatric: N/A  The following represents the patient's updated allergies and side effects list: Allergies  Allergen Reactions  . Codeine Phosphate     REACTION: unspecified    The neurologically relevant items on the patient's problem list were reviewed  on today's visit.  Neurologic Examination  A problem focused neurological exam (12 or more points of the single system neurologic examination, vital signs counts as 1 point, cranial nerves count for 8 points) was performed.  Blood pressure 129/44, pulse 51, height 5' 2.5" (1.588 m), weight 138 lb 3.2 oz (62.687 kg).  General - Well nourished, well developed, in no apparent distress.  Ophthalmologic - not able to see through.  Cardiovascular - Regular rate and rhythm with no murmur.  Mental Status -  Level of arousal and orientation to time, place, and person were intact. Language including expression, naming, repetition, comprehension was assessed and found intact.  Cranial Nerves II - XII - II - Visual field intact OU. III, IV, VI - Extraocular movements intact. V - Facial sensation intact bilaterally. VII - Facial movement intact bilaterally. VIII - Hearing & vestibular intact bilaterally. X - Palate elevates symmetrically. XI - Chin turning & shoulder shrug intact bilaterally. XII - Tongue protrusion intact.  Motor Strength - The patient's strength was normal in all extremities and pronator drift was absent.  Bulk was normal and fasciculations were absent.   Motor Tone - Muscle tone was assessed at the neck and appendages and was normal.  Reflexes - The patient's reflexes were normal in all extremities and she had no pathological reflexes.  Sensory - Light touch, temperature/pinprick were assessed and were normal.    Coordination - The patient had normal movements in the hands and feet with no ataxia or dysmetria.  Tremor was absent.  Gait and Station - mildly slow gait otherwise normal.  Data reviewed: I personally reviewed the images and agree with the radiology interpretations.  CTA neck 04/12/14 High-grade complex plaque involving the proximal innominate artery with extensive calcification in irregularity, estimated at least 90% stenosis. Occluded left subclavian  with widely patent left carotid to subclavian bypass. Estimated 75-80% calcific stenosis left internal carotid artery origin. Non stenotic atheromatous change right carotid bifurcation. Right vertebral dominant without significant proximal flow reducing lesion. Left vertebral patent.  MRI 04/10/14 Acute nonhemorrhagic infarct of the right parietal lobe measures 10 mm.  CTA head 05/29/14 Extensive atherosclerotic calcification affecting the major vessels at the base of the brain. No stenosis in the carotid siphon regions greater than 30%. No focal intracranial stenosis beyond that. 50% stenosis of the right vertebral artery at the foramen magnum.  Three punctate calcifications along the surface of the brain in the parietal regions. These could represent benign leptomeningeal calcifications or could represent embolized calcium related to a previous vascular event. They were present on the study of 04/09/2014.  CUS 10/15/14 - bilateral 40-59% ICA stenosis -  left carotid subclavian bypass is patent  2D echo 04/11/14 - Left ventricle: The cavity size was normal. Wall thickness was normal. Systolic function was normal. The estimated ejection fraction was in the range of 50% to 55%. Moderate hypokinesis of the mid-distalanteroseptal myocardium. Features are consistent with a pseudonormal left ventricular filling pattern, with concomitant abnormal relaxation and increased filling pressure (grade 2 diastolic dysfunction). - Aortic valve: Mildly to moderately calcified annulus. Mildly calcified leaflets. Valve area: 0.96cm^2(VTI). Valve area: 1cm^2 (Vmax). -  Mitral valve: Mild regurgitation. - Left atrium: The atrium was severely dilated. - Right ventricle: The cavity size was mildly dilated. Systolic function was mildly reduced. - Pulmonary arteries: Systolic pressure was mildly increased. PA peak pressure: 73mm Hg (S).  Hgb A1c was 6.9. LDL was  45.  Assessment: As you may recall, she is a 73 y.o. Caucasian female with PMH of CAD s/p stent, CHF, PVD, HTN, HLD, DM, COPD was admitted to [facility] on 04/09/14 for syncope episode. MRI showed punctate right parietal subcortical infarct. Also found to have pAfib/flutter, so she was put on amiodarone and metoprolol and Xarelto. However, after discharge in 07/2014, she developed GIB and Xarelto stopped. GI work up unrevealing except diverticulosis and one single polyp which was resected. GI stated that "No absolute contraindication to restarting anticoagulants however recurrent GI bleeding is a potential risk". Her anemia much better and Hb was 11.2 yesterday. Repeat CUS showed bilateral 40-59% ICA stenosis, continue medical management. The question now is the timing to restart anticoagulation, what agent to use and what dosage to use. I prefer to restart ASAP for stroke prevention, use eliquis and 5mg  bid is the normal dose but 2.5mg  bid is also acceptable for me due to GI bleeding on Xarelto without clear source. Pt is going to discuss with Dr. Meda Coffee tomorrow to seek her opinion too.  Plan:  - continue ASA and lipitor for now for stroke prevention - pt will discuss with Dr. Meda Coffee regarding the timing, agent and dosage about resuming NOAC - Follow up with your primary care physician for stroke risk factor modification. Recommend maintain blood pressure goal <130/80, diabetes with hemoglobin A1c goal below 6.5% and lipids with LDL cholesterol goal below 70 mg/dL.  - follow up with vascular surgery too - check BP and glucose at home - RTC in 6 months  No orders of the defined types were placed in this encounter.    No orders of the defined types were placed in this encounter.    Patient Instructions  - continue ASA and lipitor for now for stroke prevention - please discuss with Dr. Meda Coffee tomorrow regarding whether she should resume the anticoagulation for stroke prevention due to Afib and the timing  of resuming anticoagulation. - if she is OK to resume, I would recommend eliquis since it carries the least GI bleeding risk among newer agents. - Also please discuss with Dr. Meda Coffee what dose she would like you to take, 2.5mg  or 5 mg twice a day.  - Follow up with your primary care physician for stroke risk factor modification. Recommend maintain blood pressure goal <130/80, diabetes with hemoglobin A1c goal below 6.5% and lipids with LDL cholesterol goal below 70 mg/dL.  - check BP and glucose at home - follow up with Dr. Kellie Simmering as scheduled. - follow up in 6 months.   Rosalin Hawking, MD PhD [facility] [address] [place], [place] [address] [phone]

## 2014-11-01 ENCOUNTER — Other Ambulatory Visit: Payer: Self-pay

## 2014-11-01 ENCOUNTER — Ambulatory Visit (INDEPENDENT_AMBULATORY_CARE_PROVIDER_SITE_OTHER): Payer: Medicare Other | Admitting: Cardiology

## 2014-11-01 ENCOUNTER — Encounter: Payer: Self-pay | Admitting: Cardiology

## 2014-11-01 VITALS — BP 142/62 | HR 56 | Ht 62.5 in | Wt 138.0 lb

## 2014-11-01 DIAGNOSIS — I5033 Acute on chronic diastolic (congestive) heart failure: Secondary | ICD-10-CM

## 2014-11-01 DIAGNOSIS — I4892 Unspecified atrial flutter: Secondary | ICD-10-CM

## 2014-11-01 DIAGNOSIS — I739 Peripheral vascular disease, unspecified: Secondary | ICD-10-CM

## 2014-11-01 DIAGNOSIS — R7989 Other specified abnormal findings of blood chemistry: Secondary | ICD-10-CM

## 2014-11-01 DIAGNOSIS — Z79899 Other long term (current) drug therapy: Secondary | ICD-10-CM

## 2014-11-01 DIAGNOSIS — I480 Paroxysmal atrial fibrillation: Secondary | ICD-10-CM

## 2014-11-01 DIAGNOSIS — Z7189 Other specified counseling: Secondary | ICD-10-CM

## 2014-11-01 DIAGNOSIS — Z5181 Encounter for therapeutic drug level monitoring: Secondary | ICD-10-CM

## 2014-11-01 DIAGNOSIS — R945 Abnormal results of liver function studies: Secondary | ICD-10-CM

## 2014-11-01 DIAGNOSIS — I10 Essential (primary) hypertension: Secondary | ICD-10-CM

## 2014-11-01 LAB — COMPREHENSIVE METABOLIC PANEL
ALT: 24 U/L (ref 0–35)
AST: 29 U/L (ref 0–37)
Albumin: 4.4 g/dL (ref 3.5–5.2)
Alkaline Phosphatase: 90 U/L (ref 39–117)
BUN: 25 mg/dL — ABNORMAL HIGH (ref 6–23)
CO2: 28 mEq/L (ref 19–32)
Calcium: 9.9 mg/dL (ref 8.4–10.5)
Chloride: 100 mEq/L (ref 96–112)
Creat: 1.48 mg/dL — ABNORMAL HIGH (ref 0.50–1.10)
Glucose, Bld: 80 mg/dL (ref 70–99)
Potassium: 4.3 mEq/L (ref 3.5–5.3)
Sodium: 141 mEq/L (ref 135–145)
Total Bilirubin: 0.5 mg/dL (ref 0.2–1.2)
Total Protein: 7.7 g/dL (ref 6.0–8.3)

## 2014-11-01 MED ORDER — AMIODARONE HCL 200 MG PO TABS: 200.0000 mg | ORAL_TABLET | Freq: Every day | ORAL | Status: DC

## 2014-11-01 MED ORDER — AMIODARONE HCL 200 MG PO TABS
200.0000 mg | ORAL_TABLET | Freq: Every day | ORAL | Status: DC
Start: 2014-11-01 — End: 2015-08-19

## 2014-11-01 MED ORDER — APIXABAN 5 MG PO TABS: 5.0000 mg | ORAL_TABLET | Freq: Two times a day (BID) | ORAL | Status: DC

## 2014-11-01 NOTE — Progress Notes (Signed)
atient ID: [name], female   DOB: Dec 12, 1940, 73 y.o.   MRN: [id number]       HPI [name] returns today for ongoing evaluation of atrial fibrillation and atrial flutter. She has an extensive past medical history with severe vascular disease including CAD, s/p CABG, peripheral vascular disease with subclavian artery bypass, HTN, and dyslipidemia and diastolic CHF. She was recently hospitalized (admitted on 9/14) with acute on chronic CHF, demand ischemia in the setting of unexplained anemia. She was diuresed 15 lbs, dry weight < 148 lbs. In the interim she developed atrial flutter with an RVR which has been only modestly symptomatic. She was placed on systemic anti-coagulation and started on amiodarone, anticoagulation stopped because of anemia.  Dr. Lovena Le reviewed her cardiac monitor and she had both atrial fib and flutter and that flutter ablation alone would be unlikely to control her symptoms. She is now following her weight very carefully. She brings her diary with stable weigh 140-142 lbs. She feels well. Her only concern is dysuria since yesterday. No fever. No orthopnea, no PND, no palpitations or syncope. No CP.   11/01/2014 - She is coming after 2 months, no dizziness or palpitations, no falls. Her most recent Hb was 11.2 on 12.2. No signs of bleeding. No CP, stable SOB. She has stopped using home O2 during the day and is only using it at night. Sh eis complaint with all her meds. Continues to have dark urine, but no dysuria. Her weight is 138 lbs, in September 142 lbs.     Allergies  Allergen Reactions  . Codeine Phosphate     REACTION: unspecified     Current Outpatient Prescriptions  Medication Sig Dispense Refill  . albuterol (PROVENTIL HFA;VENTOLIN HFA) 108 (90 BASE) MCG/ACT inhaler Inhale 2 puffs into the lungs every 4 (four) hours as needed for wheezing or shortness of breath.    Marland Kitchen amiodarone (PACERONE) 200 MG tablet TAKE 2 TABLET (400MG ) BY MOUTH DAILY 60 tablet 2    . aspirin EC 81 MG EC tablet Take 1 tablet (81 mg total) by mouth daily. 30 tablet 0  . atorvastatin (LIPITOR) 40 MG tablet Take 1 tablet (40 mg total) by mouth every evening. 90 tablet 1  . diltiazem (CARDIZEM CD) 120 MG 24 hr capsule Take 1 capsule (120 mg total) by mouth daily. 30 capsule 11  . furosemide (LASIX) 40 MG tablet Take 1 tablet (40 mg total) by mouth daily. 90 tablet 1  . gabapentin (NEURONTIN) 600 MG tablet TAKE 1 TABLET (600 MG TOTAL) BY MOUTH 3 (THREE) TIMES DAILY. 270 tablet 3  . glimepiride (AMARYL) 1 MG tablet Take 1 mg by mouth daily with breakfast.    . HYDROcodone-acetaminophen (NORCO/VICODIN) 5-325 MG per tablet Take 0.5 tablets by mouth every 6 (six) hours as needed for moderate pain. 90 tablet 0  . KLOR-CON 10 10 MEQ tablet TAKE 1 TABLET (10 MEQ TOTAL) BY MOUTH DAILY. 30 tablet 6  . LORazepam (ATIVAN) 0.5 MG tablet Take 0.5 mg by mouth 2 (two) times daily.    . metFORMIN (GLUCOPHAGE) 500 MG tablet Take 1 tablet (500 mg total) by mouth 2 (two) times daily with a meal. 180 tablet 3  . metoprolol (LOPRESSOR) 50 MG tablet Take 25 mg by mouth 2 (two) times daily.     . Multiple Vitamins-Minerals (CENTRUM SILVER ULTRA WOMENS PO) Take 1 capsule by mouth daily.     . nitroGLYCERIN (NITROSTAT) 0.4 MG SL tablet Place 0.4 mg under the  tongue every 5 (five) minutes as needed for chest pain.    . promethazine (PHENERGAN) 25 MG tablet Take 1 tablet (25 mg total) by mouth every 6 (six) hours as needed for nausea or vomiting. 20 tablet 2  . sertraline (ZOLOFT) 50 MG tablet TAKE 1 TABLET (50 MG TOTAL) BY MOUTH DAILY. (Patient taking differently: TAKE 1/2 TABLET (25 MG TOTAL) BY MOUTH DAILY.) 90 tablet 1  . tiotropium (SPIRIVA) 18 MCG inhalation capsule Place 1 capsule (18 mcg total) into inhaler and inhale daily. 90 capsule 3   No current facility-administered medications for this visit.     Past Medical History  Diagnosis Date  . ANEMIA 08/28/2010  . CAD (coronary artery disease)      a. s/p CABGx4 in 2001.  . Adenomatous colon polyp 2000  . COPD (chronic obstructive pulmonary disease)   . Depressive disorder   . GERD 12/19/2009  . Hyperlipidemia   . Hypertension   . HYPOTENSION 08/11/2010  . Osteoarthritis     Lumbar stenosis  . PVD (peripheral vascular disease)     a. Duplex 03/2013: stable moderate carotid dz - 78-29% RICA, 56-21% LICA, L subclavian artery to CCA stent widely patent.  b. Prior R fempop bypass graft, R CIA stent.  . Lung abscess 2011  . CHF (congestive heart failure)   . Diverticulosis   . Hiatal hernia     a. Dx 01/2014, placed on Xarelto.  . Aortic stenosis     a. Mild by echo 01/2013.  . Diabetes mellitus without complication     ROS:   All systems reviewed and negative except as noted in the HPI.   Past Surgical History  Procedure Laterality Date  . L subclavian bypass  07/2004  . Tubal ligation    . Coronary artery bypass graft  05/2000    LIMA-LAD, SVG-OM, SVG-PDA-PL  . Femoral popliteal bypass-r  04/1999  . Right common iliac pta with stent placement  07/2000  . Right leg blockage  2003  . Aorta bifemoral bypass graft  2004  . Left cea  2005  . Lung cyst biopsy  2011  . Esophagogastroduodenoscopy N/A 08/03/2014    Procedure: ESOPHAGOGASTRODUODENOSCOPY (EGD);  Surgeon: Alexandra Craver, MD;  Location: [facility] ENDOSCOPY;  Service: Endoscopy;  Laterality: N/A;  . Colonoscopy Left 08/05/2014    Procedure: COLONOSCOPY;  Surgeon: Alexandra Craver, MD;  Location: [place];  Service: Endoscopy;  Laterality: Left;  [name] capsule study N/A 08/06/2014    Procedure: GIVENS CAPSULE STUDY;  Surgeon: Alexandra Shipper, MD;  Location: [place];  Service: Endoscopy;  Laterality: N/A;     Family History  Problem Relation Age of Onset  . Diabetes Father   . Heart attack Mother   . Diabetes    . Coronary artery disease    . Colon cancer Neg Hx      History   Social History  . Marital Status: Widowed    Spouse Name: N/A    Number of Children: 2  .  Years of Education: 12   Occupational History  . Receptionist    Social History Main Topics  . Smoking status: Former Smoker -- 1.50 packs/day for 40 years    Types: Cigarettes    Quit date: 06/29/2000  . Smokeless tobacco: Never Used  . Alcohol Use: No  . Drug Use: No  . Sexual Activity: Not on file   Other Topics Concern  . Not on file   Social History Narrative   Patient  is widowed with 2 children.   Patient is right handed.   Patient has hs education.   Patient drinks 2 cups daily.     There were no vitals taken for this visit.  Physical Exam: 142/62 mmHg, HR 56 BPM, Weight 138 lbs stable appearing 73 yo woman, NAD HEENT: Unremarkable Neck:  No JVD, no thyromegally Back:  No CVA tenderness Lungs:  Clear with scattered rales. Decreased breath sounds. HEART:  Regular rate rhythm, no murmurs, no rubs, no clicks Abd:  soft, positive bowel sounds, no organomegally, no rebound, no guarding Ext:  2 plus pulses, no edema, no cyanosis, no clubbing Skin:  No rashes no nodules Neuro:  CN II through XII intact, motor grossly intact  EKG - NSR at 65/min.  Echo 04/11/2014  Left ventricle: The cavity size was normal. Wall thickness was normal. Systolic function was normal. The estimated ejection fraction was in the range of 50% to 55%. Moderate hypokinesis of the mid-distalanteroseptal myocardium. Features are consistent with a pseudonormal left ventricular filling pattern, with concomitant abnormal relaxation and increased filling pressure (grade 2 diastolic dysfunction). - Aortic valve: Mildly to moderately calcified annulus. Mildly calcified leaflets. Valve area: 0.96cm^2(VTI). Valve area: 1cm^2 (Vmax). - Mitral valve: Mild regurgitation. - Left atrium: The atrium was severely dilated. - Right ventricle: The cavity size was mildly dilated. Systolic function was mildly reduced. - Pulmonary arteries: Systolic pressure was mildly increased. PA peak pressure: 48mm Hg  (S).    Assess/Plan:  1. Acute on chronic diastolic heart failure (EF 50-55%)  - Lasix, 40 mg once a day, now euvolemic, new dry weight is 138 lbs - improved SOB, no need for day use of home O2  2. Demand ischemia while in a-flutter with RVR-  - mildly elevated troponin.  - No cardiac catheterization, no symptoms of unstable angina.  - Low-dose aspirin. Continue with beta blocker.   3. COPD  - Chronic, playing a role in her dyspnea.  - Continue with oxygen therapy only at night. Continue COPD regimen.   4. Paroxysmal atrial fibrillation  - Currently sinus rhythm on amiodarone, low-dose, decrease to 200 mg po daily, follow TSH today - Maintaining sinus rhythm. Cardizem CD 120 as well.  Her atrial arrhythmias appear to be well-controlled. She will continue low-dose amiodarone therapy. - Hb stable, we will restart anticoagulation with apixaban (Eliquis 5 mg po BID), she was given 2 months of free supply  5. Elevated LFTs  - we will recheck today  6. Coronary artery disease  - Bypass 2001, stable. Elevated troponin secondary to demand ischemia.   7. Peripheral vascular disease  - Aortobifemoral in 2005, stable. Pletal -stopped given her history of diastolic heart failure and potential bleeding. Continue aspirin if possible.   8. Anemia-GI seeing. Stable Hb 11.2 on 12/2, restarting Eliquis, we will recheck Hb at the next visit.  9. Dysuria - treated for UTI, resolved, residual dark yellow urine might be sec to vitamin use.    Follow up in 3 months. TSH and CMP check today.  [name] 11/01/2014

## 2014-11-01 NOTE — Patient Instructions (Signed)
**Note De-Identified Baylynn Shifflett Obfuscation** Your physician has recommended you make the following change in your medication: start taking Eliquis 5 mg twice daily and decrease Amiodarone to 200 mg once daily  Your physician recommends that you return for lab work in: today (CMET and TSH)  Your physician recommends that you schedule a follow-up appointment in: 3 months.

## 2014-11-02 LAB — TSH: TSH: 5.914 u[IU]/mL — ABNORMAL HIGH (ref 0.350–4.500)

## 2014-11-04 ENCOUNTER — Encounter: Payer: Medicare Other | Attending: Internal Medicine

## 2014-11-04 ENCOUNTER — Telehealth: Payer: Self-pay | Admitting: *Deleted

## 2014-11-04 DIAGNOSIS — Z713 Dietary counseling and surveillance: Secondary | ICD-10-CM | POA: Insufficient documentation

## 2014-11-04 DIAGNOSIS — E119 Type 2 diabetes mellitus without complications: Secondary | ICD-10-CM | POA: Insufficient documentation

## 2014-11-04 MED ORDER — FUROSEMIDE 20 MG PO TABS: 20.0000 mg | ORAL_TABLET | Freq: Every day | ORAL | Status: AC

## 2014-11-04 NOTE — Progress Notes (Signed)
Appt start time: 0900 end time:  1000.  Patient was seen on 11/04/2014 for a review of the series of three diabetes self-management courses at the [facility]. The following learning objectives were met by the patient during this class:  . Reviewed blood glucose monitoring and interpretation including the recommended target ranges and Hgb A1c.  . Reviewed on carb counting, importance of regularly scheduled meals/snacks, and meal planning.  . Reviewed the effects of physical activity on glucose levels and long-term glucose control.  Recommended goal of 150 minutes of physical activity/week. . Reviewed patient medications and discussed role of medication on blood glucose and possible side effects. . Discussed strategies to manage stress, psychosocial issues, and other obstacles to diabetes management. . Encouraged moderate weight reduction to improve glucose levels.   . Reviewed short-term complications: hyper- and hypo-glycemia.  Discussed causes, symptoms, and treatment options. . Reviewed prevention, detection, and treatment of long-term complications.  Discussed the role of prolonged elevated glucose levels on body systems.  Goals:  Follow Diabetes Meal Plan as instructed  Eat 3 meals and 2 snacks, every 3-5 hrs  Limit carbohydrate intake to 45 grams carbohydrate/meal Limit carbohydrate intake to 15 grams carbohydrate/snack Add lean protein foods to meals/snacks  Monitor glucose levels as instructed by your doctor  Aim for goal of 15-30 mins of physical activity daily as tolerated  Bring food record and glucose log to your next nutrition visit   

## 2014-11-04 NOTE — Telephone Encounter (Signed)
Informed the pt that per Dr Meda Coffee her labs indicated that she needs to cut her lasix to 20 mg po daily.  Confirmed the pharmacy of choice with the pt.  Pt verbalized understanding and agrees with this plan.

## 2014-11-04 NOTE — Telephone Encounter (Signed)
-----   Message from Dorothy Spark, MD sent at 11/04/2014  1:12 PM EST ----- Please let her know that she needs to cut Lasix to 20 mg po daily. Thank you, K

## 2014-11-05 ENCOUNTER — Telehealth: Payer: Self-pay | Admitting: Cardiology

## 2014-11-05 NOTE — Telephone Encounter (Signed)
New message      Talk to the nurse about reducing one of her medications.  She need to talk to you again to get a clear understanding on what to do

## 2014-11-05 NOTE — Telephone Encounter (Signed)
Informed the pt that per Dr Meda Coffee her labs indicated that she needs to cut her lasix to 20 mg po daily, as discussed on the telephone on 11/04/14.  Informed the pt that she was on Lasix 40 mg po daily, and now needs to cut that in 1/2 to 20 mg po daily, as indicated on her lab results.  Informed the pt that this med was called into her pharmacy yesterday with 3 refills. Pt verbalized understanding and agrees with this plan.

## 2014-11-07 ENCOUNTER — Encounter (HOSPITAL_COMMUNITY): Payer: Self-pay | Admitting: Cardiology

## 2014-11-18 ENCOUNTER — Telehealth: Payer: Self-pay | Admitting: Cardiology

## 2014-11-18 ENCOUNTER — Telehealth: Payer: Self-pay | Admitting: Critical Care Medicine

## 2014-11-18 NOTE — Telephone Encounter (Signed)
Patient returning call.

## 2014-11-18 NOTE — Telephone Encounter (Signed)
Called and spoke to pt. Pt requesting samples of spiriva. Informed pt we are out of spiriva samples at this time and if she needs she can call back. Pt verbalized understanding and denied any further questions or concerns at this time.

## 2014-11-18 NOTE — Telephone Encounter (Signed)
New msg     Pt states phys therapy was denied by insurance and she's not sure what to do next.    Please call pt

## 2014-11-18 NOTE — Telephone Encounter (Signed)
lmtcb X1 for pt.  There are no handihaler samples in sample closet.

## 2014-11-18 NOTE — Telephone Encounter (Signed)
Pt calling stating she was discharged from the hospital back in Sept of 2015 and the Case Manager discharged her with PT, OT, and Skilled Nursing in the home with [facility].  Pt calling stating that she received these services all the way up to Oct. 19, 2015 and then they discharged her from their services.  Pt states she just received a notice in the mail stating that the time she received HHC, will not be covered by [facility].  Pt asking if Dr Meda Coffee will help with this, or is there another resource she should be reaching out to.  The pt states her letter says this is "a letter of appeal." Advised the pt that she should contact [place] right now, and discuss her coverage with them.  Informed the pt that if [employer] needs any documentation proving she received services from Dr Meda Coffee, and it helps in aiding the coverage, they can contact our office for medical release of information given by the pt.  Informed the pt that Dr Meda Coffee typically doesn't deal with insurance coverage, however she can help with aiding in proving care rendered by her. Advised the pt to contact our office if medical records needed from a cardiac standpoint.  Pt verbalized understanding and agrees with this plan.

## 2014-11-28 ENCOUNTER — Telehealth: Payer: Self-pay | Admitting: Critical Care Medicine

## 2014-11-28 NOTE — Telephone Encounter (Signed)
Called and spoke with pt and she is aware that we have no samples of the spiriva.  She will call back next week to see if any are here.  Nothing further is needed.Marland Kitchen

## 2014-12-11 ENCOUNTER — Ambulatory Visit (INDEPENDENT_AMBULATORY_CARE_PROVIDER_SITE_OTHER): Payer: Medicare Other | Admitting: Internal Medicine

## 2014-12-11 ENCOUNTER — Encounter: Payer: Self-pay | Admitting: Internal Medicine

## 2014-12-11 ENCOUNTER — Telehealth: Payer: Self-pay | Admitting: Critical Care Medicine

## 2014-12-11 VITALS — BP 166/60 | HR 53 | Temp 97.6°F | Resp 18 | Ht 62.5 in | Wt 138.0 lb

## 2014-12-11 DIAGNOSIS — I739 Peripheral vascular disease, unspecified: Secondary | ICD-10-CM

## 2014-12-11 DIAGNOSIS — J449 Chronic obstructive pulmonary disease, unspecified: Secondary | ICD-10-CM

## 2014-12-11 DIAGNOSIS — E1051 Type 1 diabetes mellitus with diabetic peripheral angiopathy without gangrene: Secondary | ICD-10-CM

## 2014-12-11 DIAGNOSIS — I5033 Acute on chronic diastolic (congestive) heart failure: Secondary | ICD-10-CM

## 2014-12-11 DIAGNOSIS — I480 Paroxysmal atrial fibrillation: Secondary | ICD-10-CM

## 2014-12-11 DIAGNOSIS — I10 Essential (primary) hypertension: Secondary | ICD-10-CM

## 2014-12-11 NOTE — Patient Instructions (Signed)
Discontinue glimepiride  Limit your sodium (Salt) intake  Return in 3 months for follow-up

## 2014-12-11 NOTE — Progress Notes (Signed)
Pre visit review using our clinic review tool, if applicable. No additional management support is needed unless otherwise documented below in the visit note. 

## 2014-12-11 NOTE — Telephone Encounter (Signed)
Crystal pt is wanting to get samples of the spiriva HH.  She is aware that we do not have any samples of the spiriva.  She is requesting that we call her if we get any samples that come in.  Will sign off of this message but forward to crystal to make her aware.

## 2014-12-11 NOTE — Progress Notes (Signed)
Subjective:    Patient ID: [name], female    DOB: 12-10-1940, 74 y.o.   MRN: [id number]  HPI  74 year old patient who is seen today for follow-up of type 2 diabetes.  She has coronary artery and peripheral vascular disease.  Quite well without cardiopulmonary complaints.  She does have COPD. Her last hemoglobin A1c was 5.9.  Patient was contacted to discontinue glimepiride.  Patient misunderstood directions and is still taking 1 mg daily.  Blood sugars are often in the seventies.  No hypoglycemia  Past Medical History  Diagnosis Date  . ANEMIA 08/28/2010  . CAD (coronary artery disease)     a. s/p CABGx4 in 2001.  . Adenomatous colon polyp 2000  . COPD (chronic obstructive pulmonary disease)   . Depressive disorder   . GERD 12/19/2009  . Hyperlipidemia   . Hypertension   . HYPOTENSION 08/11/2010  . Osteoarthritis     Lumbar stenosis  . PVD (peripheral vascular disease)     a. Duplex 03/2013: stable moderate carotid dz - 94-85% RICA, 46-27% LICA, L subclavian artery to CCA stent widely patent.  b. Prior R fempop bypass graft, R CIA stent.  . Lung abscess 2011  . CHF (congestive heart failure)   . Diverticulosis   . Hiatal hernia     a. Dx 01/2014, placed on Xarelto.  . Aortic stenosis     a. Mild by echo 01/2013.  . Diabetes mellitus without complication     History   Social History  . Marital Status: Widowed    Spouse Name: N/A    Number of Children: 2  . Years of Education: 12   Occupational History  . Receptionist    Social History Main Topics  . Smoking status: Former Smoker -- 1.50 packs/day for 40 years    Types: Cigarettes    Quit date: 06/29/2000  . Smokeless tobacco: Never Used  . Alcohol Use: No  . Drug Use: No  . Sexual Activity: Not on file   Other Topics Concern  . Not on file   Social History Narrative   Patient is widowed with 2 children.   Patient is right handed.   Patient has hs education.   Patient drinks 2 cups daily.    Past  Surgical History  Procedure Laterality Date  . L subclavian bypass  07/2004  . Tubal ligation    . Coronary artery bypass graft  05/2000    LIMA-LAD, SVG-OM, SVG-PDA-PL  . Femoral popliteal bypass-r  04/1999  . Right common iliac pta with stent placement  07/2000  . Right leg blockage  2003  . Aorta bifemoral bypass graft  2004  . Left cea  2005  . Lung cyst biopsy  2011  . Esophagogastroduodenoscopy N/A 08/03/2014    Procedure: ESOPHAGOGASTRODUODENOSCOPY (EGD);  Surgeon: Juanita Craver, MD;  Location: [facility] ENDOSCOPY;  Service: Endoscopy;  Laterality: N/A;  . Colonoscopy Left 08/05/2014    Procedure: COLONOSCOPY;  Surgeon: Juanita Craver, MD;  Location: [place];  Service: Endoscopy;  Laterality: Left;  Freda Munro capsule study N/A 08/06/2014    Procedure: GIVENS CAPSULE STUDY;  Surgeon: Irene Shipper, MD;  Location: [place];  Service: Endoscopy;  Laterality: N/A;  . Left heart catheterization with coronary/graft angiogram  03/11/2014    Procedure: LEFT HEART CATHETERIZATION WITH Beatrix Fetters;  Surgeon: Neola Worrall M Martinique, MD;  Location: [facility] CATH LAB;  Service: Cardiovascular;;    Family History  Problem Relation Age of Onset  . Diabetes Father   .  Heart attack Mother   . Diabetes    . Coronary artery disease    . Colon cancer Neg Hx     Allergies  Allergen Reactions  . Codeine Phosphate     REACTION: unspecified    Current Outpatient Prescriptions on File Prior to Visit  Medication Sig Dispense Refill  . albuterol (PROVENTIL HFA;VENTOLIN HFA) 108 (90 BASE) MCG/ACT inhaler Inhale 2 puffs into the lungs every 4 (four) hours as needed for wheezing or shortness of breath.    Marland Kitchen amiodarone (PACERONE) 200 MG tablet Take 1 tablet (200 mg total) by mouth daily. 30 tablet 3  . apixaban (ELIQUIS) 5 MG TABS tablet Take 1 tablet (5 mg total) by mouth 2 (two) times daily. 60 tablet 3  . aspirin EC 81 MG EC tablet Take 1 tablet (81 mg total) by mouth daily. 30 tablet 0  . atorvastatin  (LIPITOR) 40 MG tablet Take 1 tablet (40 mg total) by mouth every evening. 90 tablet 1  . diltiazem (CARDIZEM CD) 120 MG 24 hr capsule Take 1 capsule (120 mg total) by mouth daily. 30 capsule 11  . furosemide (LASIX) 20 MG tablet Take 1 tablet (20 mg total) by mouth daily. 90 tablet 3  . gabapentin (NEURONTIN) 600 MG tablet TAKE 1 TABLET (600 MG TOTAL) BY MOUTH 3 (THREE) TIMES DAILY. 270 tablet 3  . HYDROcodone-acetaminophen (NORCO/VICODIN) 5-325 MG per tablet Take 0.5 tablets by mouth every 6 (six) hours as needed for moderate pain. 90 tablet 0  . KLOR-CON 10 10 MEQ tablet TAKE 1 TABLET (10 MEQ TOTAL) BY MOUTH DAILY. 30 tablet 6  . LORazepam (ATIVAN) 0.5 MG tablet Take 0.5 mg by mouth 2 (two) times daily.    . metFORMIN (GLUCOPHAGE) 500 MG tablet Take 1 tablet (500 mg total) by mouth 2 (two) times daily with a meal. 180 tablet 3  . metoprolol (LOPRESSOR) 50 MG tablet Take 25 mg by mouth 2 (two) times daily.     . Multiple Vitamins-Minerals (CENTRUM SILVER ULTRA WOMENS PO) Take 1 capsule by mouth daily.     . nitroGLYCERIN (NITROSTAT) 0.4 MG SL tablet Place 0.4 mg under the tongue every 5 (five) minutes as needed for chest pain.    . promethazine (PHENERGAN) 25 MG tablet Take 1 tablet (25 mg total) by mouth every 6 (six) hours as needed for nausea or vomiting. 20 tablet 2  . sertraline (ZOLOFT) 50 MG tablet TAKE 1 TABLET (50 MG TOTAL) BY MOUTH DAILY. (Patient taking differently: TAKE 1/2 TABLET (25 MG TOTAL) BY MOUTH DAILY.) 90 tablet 1  . tiotropium (SPIRIVA) 18 MCG inhalation capsule Place 1 capsule (18 mcg total) into inhaler and inhale daily. 90 capsule 3   No current facility-administered medications on file prior to visit.    BP 166/60 mmHg  Pulse 53  Temp(Src) 97.6 F (36.4 C) (Oral)  Resp 18  Ht 5' 2.5" (1.588 m)  Wt 138 lb (62.596 kg)  BMI 24.82 kg/m2  SpO2 97%     Review of Systems  Constitutional: Negative.   HENT: Negative for congestion, dental problem, hearing loss,  rhinorrhea, sinus pressure, sore throat and tinnitus.   Eyes: Negative for pain, discharge and visual disturbance.  Respiratory: Negative for cough and shortness of breath.   Cardiovascular: Negative for chest pain, palpitations and leg swelling.  Gastrointestinal: Negative for nausea, vomiting, abdominal pain, diarrhea, constipation, blood in stool and abdominal distention.  Genitourinary: Negative for dysuria, urgency, frequency, hematuria, flank pain, vaginal bleeding, vaginal discharge,  difficulty urinating, vaginal pain and pelvic pain.  Musculoskeletal: Negative for joint swelling, arthralgias and gait problem.  Skin: Negative for rash.  Neurological: Negative for dizziness, syncope, speech difficulty, weakness, numbness and headaches.  Hematological: Negative for adenopathy.  Psychiatric/Behavioral: Negative for behavioral problems, dysphoric mood and agitation. The patient is not nervous/anxious.        Objective:   Physical Exam  Constitutional: She is oriented to person, place, and time. She appears well-developed and well-nourished.  Blood pressure 150/60 left arm  HENT:  Head: Normocephalic.  Right Ear: External ear normal.  Left Ear: External ear normal.  Mouth/Throat: Oropharynx is clear and moist.  Eyes: Conjunctivae and EOM are normal. Pupils are equal, round, and reactive to light.  Neck: Normal range of motion. Neck supple. No thyromegaly present.  Loud bruits in all carotid and supraclavicular regions  Cardiovascular: Normal rate and regular rhythm.   Murmur heard. Grade 3/6 systolic murmur loudest at the base Pedal pulses not easily palpable through thin socks  Pulmonary/Chest: Effort normal and breath sounds normal.  Abdominal: Soft. Bowel sounds are normal. She exhibits no mass. There is no tenderness.  Musculoskeletal: Normal range of motion.  Lymphadenopathy:    She has no cervical adenopathy.  Neurological: She is alert and oriented to person, place, and  time.  Skin: Skin is warm and dry. No rash noted.  Psychiatric: She has a normal mood and affect. Her behavior is normal.          Assessment & Plan:   Diabetes mellitus.  Glimepiride discontinued.  We'll continue metformin therapy Hypertension PAD, stable  Recheck 3 months with hemoglobin A1c

## 2014-12-12 NOTE — Telephone Encounter (Signed)
lmomtcb to inform pt we still do not have any spiriva HH samples.  Samples come in at different times.  Pt can feel free to call office back to see if any samples have come in.

## 2014-12-13 NOTE — Telephone Encounter (Signed)
Spoke with pt - Discussed below.  Pt verbalized understanding and will call back at a later date to check on samples.   She was appreciative of our help and voiced no further questions or concerns at this time.

## 2014-12-13 NOTE — Telephone Encounter (Signed)
We still do not have any Spiriva HH samples in.  lmomtcb to inform pt and ask she please call office back to see if any samples come in.

## 2015-01-11 ENCOUNTER — Other Ambulatory Visit: Payer: Self-pay | Admitting: Cardiology

## 2015-01-31 ENCOUNTER — Other Ambulatory Visit: Payer: Self-pay | Admitting: Internal Medicine

## 2015-02-05 ENCOUNTER — Ambulatory Visit: Payer: Medicare Other | Admitting: Cardiology

## 2015-02-11 ENCOUNTER — Other Ambulatory Visit: Payer: Self-pay

## 2015-02-11 MED ORDER — GLUCOSE BLOOD VI STRP: ORAL_STRIP | Status: DC

## 2015-02-11 NOTE — Telephone Encounter (Signed)
Rx request for Accu-chek aviva plus test strips #100- use 1 strip once daily as needed  Pharm:  [facility]   Rx sent to pharmacy with dx code and instructions.

## 2015-02-12 ENCOUNTER — Ambulatory Visit (INDEPENDENT_AMBULATORY_CARE_PROVIDER_SITE_OTHER): Payer: Medicare Other | Admitting: Critical Care Medicine

## 2015-02-12 ENCOUNTER — Encounter: Payer: Self-pay | Admitting: Critical Care Medicine

## 2015-02-12 VITALS — BP 122/70 | HR 54 | Temp 97.8°F | Ht 62.0 in | Wt 143.4 lb

## 2015-02-12 DIAGNOSIS — J449 Chronic obstructive pulmonary disease, unspecified: Secondary | ICD-10-CM

## 2015-02-12 NOTE — Progress Notes (Signed)
Subjective:    Patient ID: [name], female    DOB: 1941-10-16, 74 y.o.   MRN: [id number]  HPI  02/12/2015 Chief Complaint  Patient presents with  . Follow-up    Last seen 10/30/14. States feeling well. Denies SOB, chest tightness or pain, cough, f/c/s, n/v, hemoptysis, or edema   Not able to use spiriva daily d/t cost Pt denies any significant sore throat, nasal congestion or excess secretions, fever, chills, sweats, unintended weight loss, pleurtic or exertional chest pain, orthopnea PND, or leg swelling Pt denies any increase in rescue therapy over baseline, denies waking up needing it or having any early am or nocturnal exacerbations of coughing/wheezing/or dyspnea. Pt also denies any obvious fluctuation in symptoms with  weather or environmental change or other alleviating or aggravating factors    Review of Systems Constitutional:   No  weight loss, night sweats,  Fevers, chills, fatigue, lassitude. HEENT:   No headaches,  Difficulty swallowing,  Tooth/dental problems,  Sore throat,                No sneezing, itching, ear ache, nasal congestion, post nasal drip,   CV:  No chest pain,  Orthopnea, PND, swelling in lower extremities, anasarca, dizziness, palpitations  GI  No heartburn, indigestion, abdominal pain, nausea, vomiting, diarrhea, change in bowel habits, loss of appetite  Resp: No shortness of breath with exertion or at rest.  No excess mucus, no productive cough,  No non-productive cough,  No coughing up of blood.  No change in color of mucus.  No wheezing.  No chest wall deformity  Skin: no rash or lesions.  GU: no dysuria, change in color of urine, no urgency or frequency.  No flank pain.  MS:  No joint pain or swelling.  No decreased range of motion.  No back pain.  Psych:  No change in mood or affect. No depression or anxiety.  No memory loss.     Objective:   Physical Exam  Filed Vitals:   02/12/15 1121  BP: 122/70  Pulse: 54  Temp: 97.8 F  (36.6 C)  TempSrc: Oral  Height: 5\' 2"  (1.575 m)  Weight: 143 lb 6.4 oz (65.046 kg)  SpO2: 94%    Gen: Pleasant, well-nourished, in no distress,  normal affect  ENT: No lesions,  mouth clear,  oropharynx clear, no postnasal drip  Neck: No JVD, no TMG, no carotid bruits  Lungs: No use of accessory muscles, no dullness to percussion, distant breath sounds  Cardiovascular: RRR, heart sounds normal, no murmur or gallops, no peripheral edema  Abdomen: soft and NT, no HSM,  BS normal  Musculoskeletal: No deformities, no cyanosis or clubbing  Neuro: alert, non focal  Skin: Warm, no lesions or rashes  No results found.       Assessment & Plan:   COPD with chronic bronchitis Copd gold B Plan Cont spiriva but recognize pt is rationing med and only used about one every 2-3 days      Updated Medication List Outpatient Encounter Prescriptions as of 02/12/2015  Medication Sig  . albuterol (PROVENTIL HFA;VENTOLIN HFA) 108 (90 BASE) MCG/ACT inhaler Inhale 2 puffs into the lungs every 4 (four) hours as needed for wheezing or shortness of breath.  Marland Kitchen amiodarone (PACERONE) 200 MG tablet Take 1 tablet (200 mg total) by mouth daily.  Marland Kitchen apixaban (ELIQUIS) 5 MG TABS tablet Take 1 tablet (5 mg total) by mouth 2 (two) times daily.  Marland Kitchen aspirin EC 81 MG  EC tablet Take 1 tablet (81 mg total) by mouth daily.  Marland Kitchen atorvastatin (LIPITOR) 40 MG tablet TAKE 1 TABLET (40 MG TOTAL) BY MOUTH EVERY EVENING.  Marland Kitchen diltiazem (CARDIZEM CD) 120 MG 24 hr capsule Take 1 capsule (120 mg total) by mouth daily.  . furosemide (LASIX) 20 MG tablet Take 1 tablet (20 mg total) by mouth daily.  Marland Kitchen gabapentin (NEURONTIN) 600 MG tablet TAKE 1 TABLET (600 MG TOTAL) BY MOUTH 3 (THREE) TIMES DAILY.  Marland Kitchen glucose blood (ACCU-CHEK AVIVA PLUS) test strip Test daily.  Marland Kitchen HYDROcodone-acetaminophen (NORCO/VICODIN) 5-325 MG per tablet Take 0.5 tablets by mouth every 6 (six) hours as needed for moderate pain.  Marland Kitchen KLOR-CON 10 10 MEQ tablet  TAKE 1 TABLET (10 MEQ TOTAL) BY MOUTH DAILY.  Marland Kitchen LORazepam (ATIVAN) 0.5 MG tablet TAKE ONE TABLET BY MOUTH TWICE DAILY  . metFORMIN (GLUCOPHAGE) 500 MG tablet Take 1 tablet (500 mg total) by mouth 2 (two) times daily with a meal.  . metoprolol (LOPRESSOR) 50 MG tablet Take 25 mg by mouth 2 (two) times daily.   . Multiple Vitamins-Minerals (CENTRUM SILVER ULTRA WOMENS PO) Take 1 capsule by mouth daily.   . nitroGLYCERIN (NITROSTAT) 0.4 MG SL tablet Place 0.4 mg under the tongue every 5 (five) minutes as needed for chest pain.  . promethazine (PHENERGAN) 25 MG tablet Take 1 tablet (25 mg total) by mouth every 6 (six) hours as needed for nausea or vomiting.  . sertraline (ZOLOFT) 50 MG tablet TAKE 1 TABLET (50 MG TOTAL) BY MOUTH DAILY. (Patient taking differently: TAKE 1/2 TABLET (25 MG TOTAL) BY MOUTH DAILY.)  . tiotropium (SPIRIVA) 18 MCG inhalation capsule Place 1 capsule (18 mcg total) into inhaler and inhale daily.

## 2015-02-12 NOTE — Patient Instructions (Signed)
Stay on spiriva , we will give samples Return 6 months

## 2015-02-13 NOTE — Assessment & Plan Note (Signed)
Copd gold B Plan Cont spiriva but recognize pt is rationing med and only used about one every 2-3 days

## 2015-02-18 ENCOUNTER — Ambulatory Visit (INDEPENDENT_AMBULATORY_CARE_PROVIDER_SITE_OTHER): Payer: Medicare Other | Admitting: Cardiology

## 2015-02-18 ENCOUNTER — Encounter: Payer: Self-pay | Admitting: *Deleted

## 2015-02-18 ENCOUNTER — Encounter: Payer: Self-pay | Admitting: Cardiology

## 2015-02-18 VITALS — BP 108/70 | HR 47 | Ht 62.5 in | Wt 141.8 lb

## 2015-02-18 DIAGNOSIS — I25810 Atherosclerosis of coronary artery bypass graft(s) without angina pectoris: Secondary | ICD-10-CM

## 2015-02-18 DIAGNOSIS — I480 Paroxysmal atrial fibrillation: Secondary | ICD-10-CM

## 2015-02-18 DIAGNOSIS — R7989 Other specified abnormal findings of blood chemistry: Secondary | ICD-10-CM

## 2015-02-18 DIAGNOSIS — Z7901 Long term (current) use of anticoagulants: Secondary | ICD-10-CM

## 2015-02-18 DIAGNOSIS — I5033 Acute on chronic diastolic (congestive) heart failure: Secondary | ICD-10-CM

## 2015-02-18 DIAGNOSIS — R300 Dysuria: Secondary | ICD-10-CM

## 2015-02-18 DIAGNOSIS — R945 Abnormal results of liver function studies: Secondary | ICD-10-CM

## 2015-02-18 DIAGNOSIS — E785 Hyperlipidemia, unspecified: Secondary | ICD-10-CM

## 2015-02-18 LAB — URINALYSIS, ROUTINE W REFLEX MICROSCOPIC
Bilirubin Urine: NEGATIVE
Hgb urine dipstick: NEGATIVE
Ketones, ur: NEGATIVE
Nitrite: NEGATIVE
Specific Gravity, Urine: 1.01 (ref 1.000–1.030)
Total Protein, Urine: NEGATIVE
Urine Glucose: NEGATIVE
Urobilinogen, UA: 0.2 (ref 0.0–1.0)
pH: 6.5 (ref 5.0–8.0)

## 2015-02-18 LAB — COMPREHENSIVE METABOLIC PANEL
ALT: 26 U/L (ref 0–35)
AST: 42 U/L — ABNORMAL HIGH (ref 0–37)
Albumin: 4.3 g/dL (ref 3.5–5.2)
Alkaline Phosphatase: 111 U/L (ref 39–117)
BUN: 25 mg/dL — ABNORMAL HIGH (ref 6–23)
CO2: 25 mEq/L (ref 19–32)
Calcium: 10.1 mg/dL (ref 8.4–10.5)
Chloride: 102 mEq/L (ref 96–112)
Creatinine, Ser: 1.4 mg/dL — ABNORMAL HIGH (ref 0.40–1.20)
GFR: 39.14 mL/min — ABNORMAL LOW (ref >60.00)
Glucose, Bld: 89 mg/dL (ref 70–99)
Potassium: 4.7 mEq/L (ref 3.5–5.1)
Sodium: 138 mEq/L (ref 135–145)
Total Bilirubin: 0.3 mg/dL (ref 0.2–1.2)
Total Protein: 7.7 g/dL (ref 6.0–8.3)

## 2015-02-18 MED ORDER — METOPROLOL TARTRATE 25 MG PO TABS: 25.0000 mg | ORAL_TABLET | Freq: Two times a day (BID) | ORAL | Status: AC

## 2015-02-18 NOTE — Progress Notes (Signed)
Patient ID: [name], female   DOB: 1941/05/26, 75 y.o.   MRN: [id number]      HPI [name] returns today for ongoing evaluation of atrial fibrillation and atrial flutter. She has an extensive past medical history with severe vascular disease including CAD, s/p CABG, peripheral vascular disease with subclavian artery bypass, HTN, and dyslipidemia and diastolic CHF. She was recently hospitalized (admitted on 9/14) with acute on chronic CHF, demand ischemia in the setting of unexplained anemia. She was diuresed 15 lbs, dry weight < 148 lbs. In the interim she developed atrial flutter with an RVR which has been only modestly symptomatic. She was placed on systemic anti-coagulation and started on amiodarone, anticoagulation stopped because of anemia.  Dr. Lovena Le reviewed her cardiac monitor and she had both atrial fib and flutter and that flutter ablation alone would be unlikely to control her symptoms. She is now following her weight very carefully. She brings her diary with stable weigh 140-142 lbs. She feels well. Her only concern is dysuria since yesterday. No fever. No orthopnea, no PND, no palpitations or syncope. No CP.   11/01/2014 - She is coming after 2 months, no dizziness or palpitations, no falls. Her most recent Hb was 11.2 on 12.2. No signs of bleeding. No CP, stable SOB. She has stopped using home O2 during the day and is only using it at night. Sh eis complaint with all her meds. Continues to have dark urine, but no dysuria. Her weight is 138 lbs, in September 142 lbs.    02/18/2015 - the patient is coming after 3 months, at the last visit we checked her Crea and it was elevated 1.1 --> 1.4, her lasix was decreased to 20 mg po daily. She has been feeling well, walking 1-1.5 mile daily with her sister, she uses O2 at night and denies DOE, orthopnea, PND. Also denies LE edema, palpitations or syncope, no signs of bleeding, no melenas. She complains of dark and fouls smelling urine.     Allergies  Allergen Reactions  . Codeine Phosphate     REACTION: unspecified     Current Outpatient Prescriptions  Medication Sig Dispense Refill  . albuterol (PROVENTIL HFA;VENTOLIN HFA) 108 (90 BASE) MCG/ACT inhaler Inhale 2 puffs into the lungs every 4 (four) hours as needed for wheezing or shortness of breath.    Marland Kitchen amiodarone (PACERONE) 200 MG tablet Take 1 tablet (200 mg total) by mouth daily. 30 tablet 3  . apixaban (ELIQUIS) 5 MG TABS tablet Take 1 tablet (5 mg total) by mouth 2 (two) times daily. 60 tablet 3  . aspirin EC 81 MG EC tablet Take 1 tablet (81 mg total) by mouth daily. 30 tablet 0  . atorvastatin (LIPITOR) 40 MG tablet TAKE 1 TABLET (40 MG TOTAL) BY MOUTH EVERY EVENING. 90 tablet 1  . diltiazem (CARDIZEM CD) 120 MG 24 hr capsule Take 1 capsule (120 mg total) by mouth daily. 30 capsule 11  . furosemide (LASIX) 20 MG tablet Take 1 tablet (20 mg total) by mouth daily. 90 tablet 3  . gabapentin (NEURONTIN) 600 MG tablet TAKE 1 TABLET (600 MG TOTAL) BY MOUTH 3 (THREE) TIMES DAILY. 270 tablet 3  . glucose blood (ACCU-CHEK AVIVA PLUS) test strip Test daily. 100 each 5  . HYDROcodone-acetaminophen (NORCO/VICODIN) 5-325 MG per tablet Take 0.5 tablets by mouth every 6 (six) hours as needed for moderate pain. 90 tablet 0  . KLOR-CON 10 10 MEQ tablet TAKE 1 TABLET (10 MEQ TOTAL) BY  MOUTH DAILY. 30 tablet 6  . LORazepam (ATIVAN) 0.5 MG tablet TAKE ONE TABLET BY MOUTH TWICE DAILY 60 tablet 2  . metFORMIN (GLUCOPHAGE) 500 MG tablet Take 1 tablet (500 mg total) by mouth 2 (two) times daily with a meal. 180 tablet 3  . metoprolol (LOPRESSOR) 50 MG tablet Take 25 mg by mouth 2 (two) times daily.     . Multiple Vitamins-Minerals (CENTRUM SILVER ULTRA WOMENS PO) Take 1 capsule by mouth daily.     . nitroGLYCERIN (NITROSTAT) 0.4 MG SL tablet Place 0.4 mg under the tongue every 5 (five) minutes as needed for chest pain.    . promethazine (PHENERGAN) 25 MG tablet Take 1 tablet (25 mg  total) by mouth every 6 (six) hours as needed for nausea or vomiting. 20 tablet 2  . sertraline (ZOLOFT) 50 MG tablet TAKE 1 TABLET (50 MG TOTAL) BY MOUTH DAILY. (Patient taking differently: TAKE 1/2 TABLET (25 MG TOTAL) BY MOUTH DAILY.) 90 tablet 1  . tiotropium (SPIRIVA) 18 MCG inhalation capsule Place 1 capsule (18 mcg total) into inhaler and inhale daily. 90 capsule 3   No current facility-administered medications for this visit.     Past Medical History  Diagnosis Date  . ANEMIA 08/28/2010  . CAD (coronary artery disease)     a. s/p CABGx4 in 2001.  . Adenomatous colon polyp 2000  . COPD (chronic obstructive pulmonary disease)   . Depressive disorder   . GERD 12/19/2009  . Hyperlipidemia   . Hypertension   . HYPOTENSION 08/11/2010  . Osteoarthritis     Lumbar stenosis  . PVD (peripheral vascular disease)     a. Duplex 03/2013: stable moderate carotid dz - 98-92% RICA, 11-94% LICA, L subclavian artery to CCA stent widely patent.  b. Prior R fempop bypass graft, R CIA stent.  . Lung abscess 2011  . CHF (congestive heart failure)   . Diverticulosis   . Hiatal hernia     a. Dx 01/2014, placed on Xarelto.  . Aortic stenosis     a. Mild by echo 01/2013.  . Diabetes mellitus without complication     ROS:   All systems reviewed and negative except as noted in the HPI.   Past Surgical History  Procedure Laterality Date  . L subclavian bypass  07/2004  . Tubal ligation    . Coronary artery bypass graft  05/2000    LIMA-LAD, SVG-OM, SVG-PDA-PL  . Femoral popliteal bypass-r  04/1999  . Right common iliac pta with stent placement  07/2000  . Right leg blockage  2003  . Aorta bifemoral bypass graft  2004  . Left cea  2005  . Lung cyst biopsy  2011  . Esophagogastroduodenoscopy N/A 08/03/2014    Procedure: ESOPHAGOGASTRODUODENOSCOPY (EGD);  Surgeon: Juanita Craver, MD;  Location: [facility] ENDOSCOPY;  Service: Endoscopy;  Laterality: N/A;  . Colonoscopy Left 08/05/2014    Procedure: COLONOSCOPY;   Surgeon: Juanita Craver, MD;  Location: [place];  Service: Endoscopy;  Laterality: Left;  Freda Munro capsule study N/A 08/06/2014    Procedure: GIVENS CAPSULE STUDY;  Surgeon: Irene Shipper, MD;  Location: [place];  Service: Endoscopy;  Laterality: N/A;  . Left heart catheterization with coronary/graft angiogram  03/11/2014    Procedure: LEFT HEART CATHETERIZATION WITH Beatrix Fetters;  Surgeon: Peter M Martinique, MD;  Location: [facility] CATH LAB;  Service: Cardiovascular;;     Family History  Problem Relation Age of Onset  . Diabetes Father   . Heart attack Mother   .  Diabetes    . Coronary artery disease    . Colon cancer Neg Hx      History   Social History  . Marital Status: Widowed    Spouse Name: N/A  . Number of Children: 2  . Years of Education: 12   Occupational History  . Receptionist    Social History Main Topics  . Smoking status: Former Smoker -- 1.50 packs/day for 40 years    Types: Cigarettes    Quit date: 06/29/2000  . Smokeless tobacco: Never Used  . Alcohol Use: No  . Drug Use: No  . Sexual Activity: Not on file   Other Topics Concern  . Not on file   Social History Narrative   Patient is widowed with 2 children.   Patient is right handed.   Patient has hs education.   Patient drinks 2 cups daily.     BP 108/70 mmHg  Pulse 47  Ht 5' 2.5" (1.588 m)  Wt 141 lb 12.8 oz (64.32 kg)  BMI 25.51 kg/m2  Physical Exam: stable appearing 74 yo woman, NAD HEENT: Unremarkable Neck:  No JVD, no thyromegally Back:  No CVA tenderness Lungs:  Clear with scattered rales. Decreased breath sounds. HEART:  Regular rate rhythm, no murmurs, no rubs, no clicks Abd:  soft, positive bowel sounds, no organomegally, no rebound, no guarding Ext:  2 plus pulses, no edema, no cyanosis, no clubbing Skin:  No rashes no nodules Neuro:  CN II through XII intact, motor grossly intact  EKG - NSR at 65/min.  Echo 04/11/2014  Left ventricle: The cavity size was normal.  Wall thickness was normal. Systolic function was normal. The estimated ejection fraction was in the range of 50% to 55%. Moderate hypokinesis of the mid-distalanteroseptal myocardium. Features are consistent with a pseudonormal left ventricular filling pattern, with concomitant abnormal relaxation and increased filling pressure (grade 2 diastolic dysfunction). - Aortic valve: Mildly to moderately calcified annulus. Mildly calcified leaflets. Valve area: 0.96cm^2(VTI). Valve area: 1cm^2 (Vmax). - Mitral valve: Mild regurgitation. - Left atrium: The atrium was severely dilated. - Right ventricle: The cavity size was mildly dilated. Systolic function was mildly reduced. - Pulmonary arteries: Systolic pressure was mildly increased. PA peak pressure: 75mm Hg (S).    Assess/Plan:  1. Acute on chronic diastolic heart failure (EF 50-55%)  - Lasix, 20 mg once a day, now euvolemic, new dry weight is 138 lbs, today 141, however appears euvolemic - improved SOB, no need for day use of home O2  2. Demand ischemia while in a-flutter with RVR-  - mildly elevated troponin.  - No cardiac catheterization, no symptoms of unstable angina.  - Low-dose aspirin. Continue with beta blocker.   3. COPD  - Chronic, playing a role in her dyspnea.  - Continue with oxygen therapy only at night. Continue COPD regimen.   4. Paroxysmal atrial fibrillation  - Currently sinus rhythm on amiodarone, low-dose, decrease to 200 mg po daily, follow TSH today - Maintaining sinus rhythm. Cardizem CD 120 as well.  Her atrial arrhythmias appear to be well-controlled. She will continue low-dose amiodarone therapy. - Hb stable, we will restart anticoagulation with apixaban (Eliquis 5 mg po BID), she was given 2 months of free supply  5. Sinus bradycardia  - decrease metoprolol to 25 mg po BID - she is asymptomatic  6. Dark, foull smelling urine - meds reviewed with our pharmacist, nothing that would couse that, we  will send urinalysis  7. Elevated LFTs  -  repeated normal  8. Coronary artery disease  - Bypass 2001, stable. Elevated troponin secondary to demand ischemia.   9. Peripheral vascular disease  - Aortobifemoral in 2005, stable. Pletal -stopped given her history of diastolic heart failure and potential bleeding. Continue aspirin if possible.   10. Anemia-GI seeing. Stable Hb 11.2 on 12/2, restarted Eliquis, we will recheck Hb at the next visit.   Follow up in 3 months. CMP and U/A today.  [name] 02/18/2015

## 2015-02-18 NOTE — Patient Instructions (Addendum)
Your physician has recommended you make the following change in your medication:   DECREASE YOUR METOPROLOL 25 MG TWICE DAILY    Your physician recommends that you return for lab work in: TODAY--- [place]    Your physician recommends that you schedule a follow-up appointment in: [place]

## 2015-03-12 ENCOUNTER — Ambulatory Visit: Payer: Self-pay | Admitting: Internal Medicine

## 2015-03-17 ENCOUNTER — Telehealth: Payer: Self-pay | Admitting: *Deleted

## 2015-03-17 ENCOUNTER — Telehealth: Payer: Self-pay | Admitting: Cardiology

## 2015-03-17 NOTE — Telephone Encounter (Signed)
Spoke with the pt on the phone and was able to get her follow up appt changed. Also gave her directions to the clinic

## 2015-03-17 NOTE — Telephone Encounter (Signed)
error 

## 2015-03-19 ENCOUNTER — Telehealth: Payer: Self-pay | Admitting: *Deleted

## 2015-03-19 ENCOUNTER — Telehealth: Payer: Self-pay | Admitting: Critical Care Medicine

## 2015-03-19 NOTE — Telephone Encounter (Signed)
We do not have samples of Spiriva at this time. Pt is aware. Nothing further was needed.

## 2015-03-19 NOTE — Telephone Encounter (Signed)
Eliquis samples placed at the front desk for patient. 

## 2015-03-24 ENCOUNTER — Ambulatory Visit: Payer: Self-pay | Admitting: Internal Medicine

## 2015-03-29 ENCOUNTER — Other Ambulatory Visit: Payer: Self-pay | Admitting: Cardiology

## 2015-04-02 ENCOUNTER — Encounter: Payer: Self-pay | Admitting: Internal Medicine

## 2015-04-02 ENCOUNTER — Ambulatory Visit (INDEPENDENT_AMBULATORY_CARE_PROVIDER_SITE_OTHER): Payer: Medicare Other | Admitting: Internal Medicine

## 2015-04-02 ENCOUNTER — Encounter: Payer: Self-pay | Admitting: *Deleted

## 2015-04-02 VITALS — BP 130/76 | HR 53 | Temp 97.8°F | Resp 18 | Ht 62.5 in | Wt 146.0 lb

## 2015-04-02 DIAGNOSIS — I10 Essential (primary) hypertension: Secondary | ICD-10-CM | POA: Diagnosis not present

## 2015-04-02 DIAGNOSIS — M150 Primary generalized (osteo)arthritis: Secondary | ICD-10-CM | POA: Diagnosis not present

## 2015-04-02 DIAGNOSIS — J449 Chronic obstructive pulmonary disease, unspecified: Secondary | ICD-10-CM | POA: Diagnosis not present

## 2015-04-02 DIAGNOSIS — M159 Polyosteoarthritis, unspecified: Secondary | ICD-10-CM

## 2015-04-02 DIAGNOSIS — E118 Type 2 diabetes mellitus with unspecified complications: Secondary | ICD-10-CM | POA: Diagnosis not present

## 2015-04-02 DIAGNOSIS — I480 Paroxysmal atrial fibrillation: Secondary | ICD-10-CM

## 2015-04-02 LAB — HEMOGLOBIN A1C: HEMOGLOBIN A1C: 6.1 % (ref 4.6–6.5)

## 2015-04-02 NOTE — Progress Notes (Signed)
Pre visit review using our clinic review tool, if applicable. No additional management support is needed unless otherwise documented below in the visit note. 

## 2015-04-02 NOTE — Patient Instructions (Signed)
Limit your sodium (Salt) intake  Return in 6 months for follow-up  

## 2015-04-02 NOTE — Progress Notes (Signed)
Subjective:    Patient ID: [name], female    DOB: [birth year], 74 y.o.   MRN: [id number]  HPI  Lab Results  Component Value Date   HGBA1C 5.9 09/10/2014    74 year old patient who is seen today for follow-up.she has type 2 diabetes.  6 months ago glimepiride was discontinued and she is controlled with metformin therapy alone.  She does check blood sugars once or twice weekly with normal results.  Last hemoglobin A1c on dual therapy was 5.9 Her cardiac status has been stable.  Since her last visit here, she has been seen by pulmonary medicine and cardiology. She has essential hypertension which has been stable .  Her cardiopulmonary status has been stable  Past Medical History  Diagnosis Date  . ANEMIA 08/28/2010  . CAD (coronary artery disease)     a. s/p CABGx4 in 2001.  . Adenomatous colon polyp 2000  . COPD (chronic obstructive pulmonary disease)   . Depressive disorder   . GERD 12/19/2009  . Hyperlipidemia   . Hypertension   . HYPOTENSION 08/11/2010  . Osteoarthritis     Lumbar stenosis  . PVD (peripheral vascular disease)     a. Duplex 03/2013: stable moderate carotid dz - 22-29% RICA, 79-89% LICA, L subclavian artery to CCA stent widely patent.  b. Prior R fempop bypass graft, R CIA stent.  . Lung abscess 2011  . CHF (congestive heart failure)   . Diverticulosis   . Hiatal hernia     a. Dx 01/2014, placed on Xarelto.  . Aortic stenosis     a. Mild by echo 01/2013.  . Diabetes mellitus without complication     History   Social History  . Marital Status: Widowed    Spouse Name: N/A  . Number of Children: 2  . Years of Education: 12   Occupational History  . Receptionist    Social History Main Topics  . Smoking status: Former Smoker -- 1.50 packs/day for 40 years    Types: Cigarettes    Quit date: 06/29/2000  . Smokeless tobacco: Never Used  . Alcohol Use: No  . Drug Use: No  . Sexual Activity: Not on file   Other Topics Concern  . Not on file    Social History Narrative   Patient is widowed with 2 children.   Patient is right handed.   Patient has hs education.   Patient drinks 2 cups daily.    Past Surgical History  Procedure Laterality Date  . L subclavian bypass  07/2004  . Tubal ligation    . Coronary artery bypass graft  05/2000    LIMA-LAD, SVG-OM, SVG-PDA-PL  . Femoral popliteal bypass-r  04/1999  . Right common iliac pta with stent placement  07/2000  . Right leg blockage  2003  . Aorta bifemoral bypass graft  2004  . Left cea  2005  . Lung cyst biopsy  2011  . Esophagogastroduodenoscopy N/A 08/03/2014    Procedure: ESOPHAGOGASTRODUODENOSCOPY (EGD);  Surgeon: Juanita Craver, MD;  Location: [facility] ENDOSCOPY;  Service: Endoscopy;  Laterality: N/A;  . Colonoscopy Left 08/05/2014    Procedure: COLONOSCOPY;  Surgeon: Juanita Craver, MD;  Location: [place];  Service: Endoscopy;  Laterality: Left;  Freda Munro capsule study N/A 08/06/2014    Procedure: GIVENS CAPSULE STUDY;  Surgeon: Irene Shipper, MD;  Location: [place];  Service: Endoscopy;  Laterality: N/A;  . Left heart catheterization with coronary/graft angiogram  03/11/2014    Procedure: LEFT HEART CATHETERIZATION  WITH Beatrix Fetters;  Surgeon: Peter M Martinique, MD;  Location: [facility] CATH LAB;  Service: Cardiovascular;;    Family History  Problem Relation Age of Onset  . Diabetes Father   . Heart attack Mother   . Diabetes    . Coronary artery disease    . Colon cancer Neg Hx     Allergies  Allergen Reactions  . Codeine Phosphate     REACTION: unspecified    Current Outpatient Prescriptions on File Prior to Visit  Medication Sig Dispense Refill  . albuterol (PROVENTIL HFA;VENTOLIN HFA) 108 (90 BASE) MCG/ACT inhaler Inhale 2 puffs into the lungs every 4 (four) hours as needed for wheezing or shortness of breath.    Marland Kitchen amiodarone (PACERONE) 200 MG tablet Take 1 tablet (200 mg total) by mouth daily. 30 tablet 3  . apixaban (ELIQUIS) 5 MG TABS tablet Take 1 tablet  (5 mg total) by mouth 2 (two) times daily. 60 tablet 3  . aspirin EC 81 MG EC tablet Take 1 tablet (81 mg total) by mouth daily. 30 tablet 0  . atorvastatin (LIPITOR) 40 MG tablet TAKE 1 TABLET (40 MG TOTAL) BY MOUTH EVERY EVENING. 90 tablet 1  . diltiazem (CARDIZEM CD) 120 MG 24 hr capsule Take 1 capsule (120 mg total) by mouth daily. 30 capsule 11  . furosemide (LASIX) 20 MG tablet Take 1 tablet (20 mg total) by mouth daily. 90 tablet 3  . gabapentin (NEURONTIN) 600 MG tablet TAKE 1 TABLET (600 MG TOTAL) BY MOUTH 3 (THREE) TIMES DAILY. 270 tablet 3  . glucose blood (ACCU-CHEK AVIVA PLUS) test strip Test daily. 100 each 5  . HYDROcodone-acetaminophen (NORCO/VICODIN) 5-325 MG per tablet Take 0.5 tablets by mouth every 6 (six) hours as needed for moderate pain. 90 tablet 0  . KLOR-CON 10 10 MEQ tablet TAKE 1 TABLET (10 MEQ TOTAL) BY MOUTH DAILY. 30 tablet 6  . LORazepam (ATIVAN) 0.5 MG tablet TAKE ONE TABLET BY MOUTH TWICE DAILY 60 tablet 2  . metFORMIN (GLUCOPHAGE) 500 MG tablet Take 1 tablet (500 mg total) by mouth 2 (two) times daily with a meal. 180 tablet 3  . metoprolol tartrate (LOPRESSOR) 25 MG tablet Take 1 tablet (25 mg total) by mouth 2 (two) times daily. 180 tablet 3  . Multiple Vitamins-Minerals (CENTRUM SILVER ULTRA WOMENS PO) Take 1 capsule by mouth daily.     . nitroGLYCERIN (NITROSTAT) 0.4 MG SL tablet Place 0.4 mg under the tongue every 5 (five) minutes as needed for chest pain.    . promethazine (PHENERGAN) 25 MG tablet Take 1 tablet (25 mg total) by mouth every 6 (six) hours as needed for nausea or vomiting. 20 tablet 2  . sertraline (ZOLOFT) 50 MG tablet TAKE 1 TABLET (50 MG TOTAL) BY MOUTH DAILY. (Patient taking differently: TAKE 1/2 TABLET (25 MG TOTAL) BY MOUTH DAILY.) 90 tablet 1  . tiotropium (SPIRIVA) 18 MCG inhalation capsule Place 1 capsule (18 mcg total) into inhaler and inhale daily. 90 capsule 3   No current facility-administered medications on file prior to visit.     Pulse 53  Temp(Src) 97.8 F (36.6 C) (Oral)  Resp 18  Ht 5' 2.5" (1.588 m)  Wt 146 lb (66.225 kg)  BMI 26.26 kg/m2  SpO2 96%     Review of Systems  Constitutional: Negative.   HENT: Negative for congestion, dental problem, hearing loss, rhinorrhea, sinus pressure, sore throat and tinnitus.   Eyes: Negative for pain, discharge and visual disturbance.  Respiratory:  Negative for cough and shortness of breath.   Cardiovascular: Negative for chest pain, palpitations and leg swelling.  Gastrointestinal: Negative for nausea, vomiting, abdominal pain, diarrhea, constipation, blood in stool and abdominal distention.  Genitourinary: Negative for dysuria, urgency, frequency, hematuria, flank pain, vaginal bleeding, vaginal discharge, difficulty urinating, vaginal pain and pelvic pain.  Musculoskeletal: Negative for joint swelling, arthralgias and gait problem.  Skin: Negative for rash.  Neurological: Negative for dizziness, syncope, speech difficulty, weakness, numbness and headaches.  Hematological: Negative for adenopathy.  Psychiatric/Behavioral: Negative for behavioral problems, dysphoric mood and agitation. The patient is not nervous/anxious.        Objective:   Physical Exam  Constitutional: She is oriented to person, place, and time. She appears well-developed and well-nourished.  Blood pressure 126/70 on the left Blood pressure decreased on the right  HENT:  Head: Normocephalic.  Right Ear: External ear normal.  Left Ear: External ear normal.  Mouth/Throat: Oropharynx is clear and moist.  Eyes: Conjunctivae and EOM are normal. Pupils are equal, round, and reactive to light.  Neck: Normal range of motion. Neck supple. No thyromegaly present.  Bilateral loud bruits  Cardiovascular: Normal rate and regular rhythm.   Murmur heard. Pedal pulses not easily palpable  Pulmonary/Chest: Effort normal and breath sounds normal.  Abdominal: Soft. Bowel sounds are normal. She  exhibits no mass. There is no tenderness.  Musculoskeletal: Normal range of motion.  Lymphadenopathy:    She has no cervical adenopathy.  Neurological: She is alert and oriented to person, place, and time.  Skin: Skin is warm and dry. No rash noted.  Psychiatric: She has a normal mood and affect. Her behavior is normal.          Assessment & Plan:   Diabetes mellitus.  Will check a hemoglobin A1c.  Continue metformin therapy Essential hypertension, stable PAD. Dyslipidemia.  Continue statin therapy Atrial fibrillation.  Continue anti-coagulation.  Patient remains on amiodarone Osteoarthritis, stable  Check hemoglobin A1c No change in medical regimen  Recheck 6 months or as needed

## 2015-04-29 ENCOUNTER — Ambulatory Visit (INDEPENDENT_AMBULATORY_CARE_PROVIDER_SITE_OTHER): Payer: Medicare Other | Admitting: Neurology

## 2015-04-29 ENCOUNTER — Encounter: Payer: Self-pay | Admitting: Neurology

## 2015-04-29 VITALS — BP 122/52 | HR 56 | Wt 146.0 lb

## 2015-04-29 DIAGNOSIS — I6340 Cerebral infarction due to embolism of unspecified cerebral artery: Secondary | ICD-10-CM | POA: Diagnosis not present

## 2015-04-29 DIAGNOSIS — I739 Peripheral vascular disease, unspecified: Secondary | ICD-10-CM

## 2015-04-29 DIAGNOSIS — I779 Disorder of arteries and arterioles, unspecified: Secondary | ICD-10-CM | POA: Diagnosis not present

## 2015-04-29 DIAGNOSIS — I480 Paroxysmal atrial fibrillation: Secondary | ICD-10-CM | POA: Diagnosis not present

## 2015-04-29 DIAGNOSIS — I10 Essential (primary) hypertension: Secondary | ICD-10-CM | POA: Diagnosis not present

## 2015-04-29 DIAGNOSIS — I639 Cerebral infarction, unspecified: Secondary | ICD-10-CM

## 2015-04-29 DIAGNOSIS — E785 Hyperlipidemia, unspecified: Secondary | ICD-10-CM

## 2015-04-29 NOTE — Progress Notes (Signed)
STROKE NEUROLOGY FOLLOW UP NOTE  NAME: [name] DOB: [id number]  REASON FOR VISIT: stroke follow up HISTORY FROM: pt and charts  Today we had the pleasure of seeing [name] in follow-up at our Neurology Clinic. Pt was accompanied by daughter.   History Summary [name] is a 74 year old right-handed woman with PMH of CAD s/p stent, CHF, PVD, HTN, HLD, DM, COPD who was admitted on 04/09/14 for an episode of syncope, headache, slurry speech and fall. CT Head was unremarkable. MRI Brain revealed acute punctate right parietal lobe infarct. She was found to have an episode of A. fib/flutter with RVR and asssociated hypotension. Cardiology started amiodarone, while Cardizem was discontinued and metoprolol was decreased. She had already previously had a carotid doppler performed on 04/04/14, which revealed 40-59% RICA stenosis and 53-00% LICA with patent vertebral arteries. Vascular surgery ordered CTA of the neck, showing at least 90% high-grade complex plaque involving the proximal innominate artery with extensive calcification and irregularity in the right ICA, and 75-80% calcific stenosis of the left ICA. Vascular surgery recommended no intervention for the right ICA. Follow-up was recommended in 6 months to re-evaluate the left ICA. She was continued on Xarelto and aspirin was discontinued. For the new-onset diabetes, she was started on Amaryl.   Follow up 08/01/14 (LL) - She is tolerating Xarelto well with no signs of significant bleeding or bruising. Daughter states her mother has been weaker than normal this week. She fell on Sunday in her kitchen; she states she is not sure what happened but her right leg tends to be weaker than the left; she was not hurt and did not hit her head. She is having trouble with her balance at times. She was found to have dark stool and anemia with plan to see GI the second day.  11/01/14 follow up - the patient has been doing gradually better. She was  admitted the same day after last visit due to acute GI bleeding and severe anemia with Hb down to 7.2. Colonoscopy was done found to have diverticulosis and one single polyp which was resected. Other GI work up did not find other source of bleeding and followed up with GI stated "No absolute contraindication to restarting anticoagulants however recurrent GI bleeding is a potential risk". Her Hb is trending up last check was 11.2 yesterday.  She also followed up with cardiology for afib and CHF, currently on lasix, amiodarone, cardiazem, still holding for anticoagulation due to GIB. She also followed up with vascular surgery and repeat CUS on 10/15/14 and it showed an approximate 60-70% bilateral ICA stenosis left worse than right and her left carotid subclavian bypass is patent, which was stable compared with previous studies. She was asked to follow up in one year. She also followed up with pulmonology and doing well so her O2 was taken off during the day and only used at night. She also followed up with her PCP and her A1C was 5.9 on 09/10/14.  Otherwise, she is feeling better gradually, more energy than before. In clinic today, she is conversing well and BP 129/44. She is still on ASA. No more melena.  Interval History During the interval time, she has been doing well. No recurrent symptoms. Dr. Meda Coffee has started her on eliquis 5mg  bid for stroke prevention due to afib. She still on baby ASA 81mg  for cardiac prevention. No GIB. Quit smoking 15 years ago. 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 Has appointment with Dr. Meda Coffee next month. BP and glucose at home controlled well. BP today 122/52.  REVIEW OF SYSTEMS: Full 14 system review of systems performed and notable only for those listed below and in HPI above, all others are negative:  Constitutional: N/A  Cardiovascular: N/A  Ear/Nose/Throat: ringing in ears  Skin: itching  Eyes: N/A  Respiratory: SOB  Gastroitestinal: N/A    Genitourinary: N/A Hematology/Lymphatic: N/A  Endocrine: cold and heat intolerance  Musculoskeletal: joint, back pain  Allergy/Immunology: N/A  Neurological: N/A  Psychiatric: depression, nervous/anxious  The following represents the patient's updated allergies and side effects list: Allergies  Allergen Reactions  . Codeine Phosphate     REACTION: unspecified    The neurologically relevant items on the patient's problem list were reviewed on today's visit.  Neurologic Examination  A problem focused neurological exam (12 or more points of the single system neurologic examination, vital signs counts as 1 point, cranial nerves count for 8 points) was performed.  Blood pressure 122/52, pulse 56, weight 146 lb (66.225 kg).  General - Well nourished, well developed, in no apparent distress.  Ophthalmologic - not able to see through.  Cardiovascular - Regular rate and rhythm with no murmur.  Mental Status -  Level of arousal and orientation to time, place, and person were intact. Language including expression, naming, repetition, comprehension was assessed and found intact.  Cranial Nerves II - XII - II - Visual field intact OU. III, IV, VI - Extraocular movements intact. V - Facial sensation intact bilaterally. VII - Facial movement intact bilaterally. VIII - Hearing & vestibular intact bilaterally. X - Palate elevates symmetrically. XI - Chin turning & shoulder shrug intact bilaterally. XII - Tongue protrusion intact.  Motor Strength - The patient's strength was normal in all extremities and pronator drift was absent.  Bulk was normal and fasciculations were absent.   Motor Tone - Muscle tone was assessed at the neck and appendages and was normal.  Reflexes - The patient's reflexes were normal in all extremities and she had no pathological reflexes.  Sensory - Light touch, temperature/pinprick were assessed and were normal.    Coordination - The patient had normal  movements in the hands and feet with no ataxia or dysmetria.  Tremor was absent.  Gait and Station - mildly slow gait otherwise normal.  Data reviewed: I personally reviewed the images and agree with the radiology interpretations.  CTA neck 04/12/14 High-grade complex plaque involving the proximal innominate artery with extensive calcification in irregularity, estimated at least 90% stenosis. 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 Estimated 75-80% calcific stenosis left internal carotid artery origin. Non stenotic atheromatous change right carotid bifurcation. Right vertebral dominant without significant proximal flow reducing lesion. Left vertebral patent.  MRI 04/10/14 Acute nonhemorrhagic infarct of the right parietal lobe measures 10 mm.  CTA head 05/29/14 Extensive atherosclerotic calcification affecting the major vessels at the base of the brain. No stenosis in the carotid siphon regions greater than 30%. No focal intracranial stenosis beyond that. 50% stenosis of the right vertebral artery at the foramen magnum.  Three punctate calcifications along the surface of the brain in the parietal regions. These could represent benign leptomeningeal calcifications or could represent embolized calcium related to a previous vascular event. They were present on the study of 04/09/2014.  CUS 10/15/14 - bilateral 40-59% ICA stenosis -  left carotid subclavian bypass is patent  2D echo 04/11/14 - Left ventricle: The cavity  size was normal. Wall thickness was normal. Systolic function was normal. The estimated ejection fraction was in the range of 50% to 55%. Moderate hypokinesis of the mid-distalanteroseptal myocardium. Features are consistent with a pseudonormal left ventricular filling pattern, with concomitant abnormal relaxation and increased filling pressure (grade 2 diastolic dysfunction). - Aortic valve: Mildly to  moderately calcified annulus. Mildly calcified leaflets. Valve area: 0.96cm^2(VTI). Valve area: 1cm^2 (Vmax). - Mitral valve: Mild regurgitation. - Left atrium: The atrium was severely dilated. - Right ventricle: The cavity size was mildly dilated. Systolic function was mildly reduced. - Pulmonary arteries: Systolic pressure was mildly increased. PA peak pressure: 75mm Hg (S).  Hgb A1c was 6.9. LDL was 45.  Assessment: As you may recall, she is a 74 y.o. Caucasian female with PMH of CAD s/p stent, CHF, PVD, HTN, HLD, DM, COPD was admitted to [facility] on 04/09/14 for syncope episode. MRI showed punctate right parietal subcortical infarct. Also found to have pAfib/flutter, so she was put on amiodarone and metoprolol and Xarelto. However, after discharge in 07/2014, she developed GIB and Xarelto stopped. GI work up unrevealing except diverticulosis and one single polyp which was resected. GI stated that "No absolute contraindication to restarting anticoagulants however recurrent GI bleeding is a potential risk". Her anemia much better and Hb was 11.2 in 10/2014. Followed up with Dr. Meda Coffee in cardiology and started with eliquis 5mg  bid and also continued with ASA 81. Pt tolerate well and no bleeding side effect so far. Her CUS 09/2014 showed bilateral 40-59% ICA stenosis, continue medical management and follow up with Dr. Kellie Simmering in one year.   Plan:  - continue eliquis, ASA and lipitor for now for stroke and cardiac prevention - follow up with Dr. Meda Coffee next month - Follow up with your primary care physician for stroke risk factor modification. Recommend maintain blood pressure goal <130/80, diabetes with hemoglobin A1c goal below 6.5% and lipids with LDL cholesterol goal below 70 mg/dL.  - follow up with vascular surgery in Nov for carotid doppler monitoring - check BP and glucose at home - RTC in 6 months  No orders of the defined types were placed in this encounter.    No orders of the  defined types were placed in this encounter.    Patient Instructions  - continue eliquis, ASA and lipitor for now for stroke and cardiac prevention - follow up with Dr. Meda Coffee as scheduled - Follow up with your primary care physician for stroke risk factor modification. Recommend maintain blood pressure goal <130/80, diabetes with hemoglobin A1c goal below 6.5% and lipids with LDL cholesterol goal below 70 mg/dL.  - follow up with vascular surgery in Nov for carotid doppler monitoring - check BP and glucose at home - RTC in 6 months     Rosalin Hawking, MD PhD [facility] [address] [place], [place] [address] [phone]

## 2015-04-29 NOTE — Patient Instructions (Addendum)
-   continue eliquis, ASA and lipitor for now for stroke and cardiac prevention - follow up with Dr. Meda Coffee as scheduled - Follow up with your primary care physician for stroke risk factor modification. Recommend maintain blood pressure goal <130/80, diabetes with hemoglobin A1c goal below 6.5% and lipids with LDL cholesterol goal below 70 mg/dL.  - follow up with vascular surgery in Nov for carotid doppler monitoring - check BP and glucose at home - RTC in 6 months

## 2015-05-12 ENCOUNTER — Ambulatory Visit: Payer: Medicare Other | Admitting: Neurology

## 2015-05-13 ENCOUNTER — Other Ambulatory Visit: Payer: Self-pay | Admitting: Internal Medicine

## 2015-05-21 ENCOUNTER — Encounter: Payer: Self-pay | Admitting: Cardiology

## 2015-05-21 ENCOUNTER — Ambulatory Visit (INDEPENDENT_AMBULATORY_CARE_PROVIDER_SITE_OTHER): Payer: Medicare Other | Admitting: Cardiology

## 2015-05-21 VITALS — BP 134/58 | HR 52 | Ht 62.5 in | Wt 142.4 lb

## 2015-05-21 DIAGNOSIS — I4891 Unspecified atrial fibrillation: Secondary | ICD-10-CM

## 2015-05-21 DIAGNOSIS — I5033 Acute on chronic diastolic (congestive) heart failure: Secondary | ICD-10-CM | POA: Insufficient documentation

## 2015-05-21 DIAGNOSIS — I480 Paroxysmal atrial fibrillation: Secondary | ICD-10-CM

## 2015-05-21 DIAGNOSIS — I10 Essential (primary) hypertension: Secondary | ICD-10-CM | POA: Diagnosis not present

## 2015-05-21 DIAGNOSIS — I739 Peripheral vascular disease, unspecified: Secondary | ICD-10-CM

## 2015-05-21 DIAGNOSIS — I2510 Atherosclerotic heart disease of native coronary artery without angina pectoris: Secondary | ICD-10-CM

## 2015-05-21 DIAGNOSIS — I2583 Coronary atherosclerosis due to lipid rich plaque: Secondary | ICD-10-CM

## 2015-05-21 LAB — BASIC METABOLIC PANEL
BUN: 20 mg/dL (ref 6–23)
CO2: 27 mEq/L (ref 19–32)
Calcium: 10 mg/dL (ref 8.4–10.5)
Chloride: 104 mEq/L (ref 96–112)
Creatinine, Ser: 1.37 mg/dL — ABNORMAL HIGH (ref 0.40–1.20)
GFR: 40.1 mL/min — ABNORMAL LOW (ref >60.00)
Glucose, Bld: 101 mg/dL — ABNORMAL HIGH (ref 70–99)
Potassium: 3.8 mEq/L (ref 3.5–5.1)
Sodium: 142 mEq/L (ref 135–145)

## 2015-05-21 LAB — HEPATIC FUNCTION PANEL
ALT: 30 U/L (ref 0–35)
AST: 37 U/L (ref 0–37)
Albumin: 3.8 g/dL (ref 3.5–5.2)
Alkaline Phosphatase: 97 U/L (ref 39–117)
Bilirubin, Direct: 0.1 mg/dL (ref 0.0–0.3)
Total Bilirubin: 0.4 mg/dL (ref 0.2–1.2)
Total Protein: 7.4 g/dL (ref 6.0–8.3)

## 2015-05-21 LAB — TSH: TSH: 5.74 u[IU]/mL — ABNORMAL HIGH (ref 0.35–4.50)

## 2015-05-21 LAB — BRAIN NATRIURETIC PEPTIDE: Pro B Natriuretic peptide (BNP): 140 pg/mL — ABNORMAL HIGH (ref 0.0–100.0)

## 2015-05-21 NOTE — Progress Notes (Signed)
Patient ID: [name], female   DOB: 1941-01-26, 74 y.o.   MRN: [id number]    Chief complain: LE edema  HPI [name] returns today for ongoing evaluation of atrial fibrillation and atrial flutter. She has an extensive past medical history with severe vascular disease including CAD, s/p CABG, peripheral vascular disease with subclavian artery bypass, HTN, and dyslipidemia and diastolic CHF. She was recently hospitalized (admitted on 9/14) with acute on chronic CHF, demand ischemia in the setting of unexplained anemia. She was diuresed 15 lbs, dry weight < 148 lbs. In the interim she developed atrial flutter with an RVR which has been only modestly symptomatic. She was placed on systemic anti-coagulation and started on amiodarone, anticoagulation stopped because of anemia.  Dr. Lovena Le reviewed her cardiac monitor and she had both atrial fib and flutter and that flutter ablation alone would be unlikely to control her symptoms. She is now following her weight very carefully. She brings her diary with stable weigh 140-142 lbs. She feels well. Her only concern is dysuria since yesterday. No fever. No orthopnea, no PND, no palpitations or syncope. No CP.   11/01/2014 - She is coming after 2 months, no dizziness or palpitations, no falls. Her most recent Hb was 11.2 on 12.2. No signs of bleeding. No CP, stable SOB. She has stopped using home O2 during the day and is only using it at night. Sh eis complaint with all her meds. Continues to have dark urine, but no dysuria. Her weight is 138 lbs, in September 142 lbs.    02/18/2015 - the patient is coming after 3 months, at the last visit we checked her Crea and it was elevated 1.1 --> 1.4, her lasix was decreased to 20 mg po daily. She has been feeling well, walking 1-1.5 mile daily with her sister, she uses O2 at night and denies DOE, orthopnea, PND. Also denies LE edema, palpitations or syncope, no signs of bleeding, no melenas. She complains of dark and  fouls smelling urine.   05/21/2015 - 3 months follow up, she is doing well, went back to work 2 hours/day. Continues to walk with her sister, denies chest pain, has stable DOE, no palpitations, or syncope.  Her only complain is of LE edema, especially at the end of day. Yesterday she was 145 lbs(baseline 141), took 20 mg of extra Lasix and diuresed 3 lbs. No bleeding from Eliquis. No PND.   Allergies  Allergen Reactions  . Codeine Phosphate     REACTION: unspecified     Current Outpatient Prescriptions  Medication Sig Dispense Refill  . albuterol (PROVENTIL HFA;VENTOLIN HFA) 108 (90 BASE) MCG/ACT inhaler Inhale 2 puffs into the lungs every 4 (four) hours as needed for wheezing or shortness of breath.    Marland Kitchen amiodarone (PACERONE) 200 MG tablet Take 1 tablet (200 mg total) by mouth daily. 30 tablet 3  . apixaban (ELIQUIS) 5 MG TABS tablet Take 1 tablet (5 mg total) by mouth 2 (two) times daily. (Patient taking differently: Take 2.5 mg by mouth 2 (two) times daily. ) 60 tablet 3  . aspirin EC 81 MG EC tablet Take 1 tablet (81 mg total) by mouth daily. 30 tablet 0  . atorvastatin (LIPITOR) 40 MG tablet TAKE 1 TABLET (40 MG TOTAL) BY MOUTH EVERY EVENING. 90 tablet 1  . diltiazem (CARDIZEM CD) 120 MG 24 hr capsule Take 1 capsule (120 mg total) by mouth daily. 30 capsule 11  . furosemide (LASIX) 20 MG tablet Take 1  tablet (20 mg total) by mouth daily. 90 tablet 3  . gabapentin (NEURONTIN) 600 MG tablet TAKE 1 TABLET (600 MG TOTAL) BY MOUTH 3 (THREE) TIMES DAILY. 270 tablet 3  . glucose blood (ACCU-CHEK AVIVA PLUS) test strip Test daily. 100 each 5  . HYDROcodone-acetaminophen (NORCO/VICODIN) 5-325 MG per tablet Take 0.5 tablets by mouth every 6 (six) hours as needed for moderate pain. 90 tablet 0  . KLOR-CON 10 10 MEQ tablet TAKE 1 TABLET (10 MEQ TOTAL) BY MOUTH DAILY. 30 tablet 6  . LORazepam (ATIVAN) 0.5 MG tablet TAKE ONE TABLET BY MOUTH TWICE DAILY 60 tablet 2  . metFORMIN (GLUCOPHAGE) 500 MG  tablet Take 1 tablet (500 mg total) by mouth 2 (two) times daily with a meal. 180 tablet 3  . metoprolol tartrate (LOPRESSOR) 25 MG tablet Take 1 tablet (25 mg total) by mouth 2 (two) times daily. 180 tablet 3  . Multiple Vitamins-Minerals (CENTRUM SILVER ULTRA WOMENS PO) Take 1 capsule by mouth daily.     . nitroGLYCERIN (NITROSTAT) 0.4 MG SL tablet Place 0.4 mg under the tongue every 5 (five) minutes as needed for chest pain.    . promethazine (PHENERGAN) 25 MG tablet Take 1 tablet (25 mg total) by mouth every 6 (six) hours as needed for nausea or vomiting. 20 tablet 2  . sertraline (ZOLOFT) 50 MG tablet TAKE 1 TABLET (50 MG TOTAL) BY MOUTH DAILY. (Patient taking differently: TAKE 1/2 TABLET (25 MG TOTAL) BY MOUTH DAILY.) 90 tablet 1  . SPIRIVA HANDIHALER 18 MCG inhalation capsule PLACE 1 CAPSULE (18 MCG TOTAL) INTO INHALER AND INHALE DAILY. 30 capsule 5  . tiotropium (SPIRIVA) 18 MCG inhalation capsule Place 1 capsule (18 mcg total) into inhaler and inhale daily. 90 capsule 3   No current facility-administered medications for this visit.     Past Medical History  Diagnosis Date  . ANEMIA 08/28/2010  . CAD (coronary artery disease)     a. s/p CABGx4 in 2001.  . Adenomatous colon polyp 2000  . COPD (chronic obstructive pulmonary disease)   . Depressive disorder   . GERD 12/19/2009  . Hyperlipidemia   . Hypertension   . HYPOTENSION 08/11/2010  . Osteoarthritis     Lumbar stenosis  . PVD (peripheral vascular disease)     a. Duplex 03/2013: stable moderate carotid dz - 94-85% RICA, 46-27% LICA, L subclavian artery to CCA stent widely patent.  b. Prior R fempop bypass graft, R CIA stent.  . Lung abscess 2011  . CHF (congestive heart failure)   . Diverticulosis   . Hiatal hernia     a. Dx 01/2014, placed on Xarelto.  . Aortic stenosis     a. Mild by echo 01/2013.  . Diabetes mellitus without complication   . Ringing in ears   . Anxiety   . Joint pain     ROS:   All systems reviewed  and negative except as noted in the HPI.   Past Surgical History  Procedure Laterality Date  . L subclavian bypass  07/2004  . Tubal ligation    . Coronary artery bypass graft  05/2000    LIMA-LAD, SVG-OM, SVG-PDA-PL  . Femoral popliteal bypass-r  04/1999  . Right common iliac pta with stent placement  07/2000  . Right leg blockage  2003  . Aorta bifemoral bypass graft  2004  . Left cea  2005  . Lung cyst biopsy  2011  . Esophagogastroduodenoscopy N/A 08/03/2014    Procedure: ESOPHAGOGASTRODUODENOSCOPY (EGD);  Surgeon: Juanita Craver, MD;  Location: [facility] ENDOSCOPY;  Service: Endoscopy;  Laterality: N/A;  . Colonoscopy Left 08/05/2014    Procedure: COLONOSCOPY;  Surgeon: Juanita Craver, MD;  Location: [place];  Service: Endoscopy;  Laterality: Left;  Freda Munro capsule study N/A 08/06/2014    Procedure: GIVENS CAPSULE STUDY;  Surgeon: Irene Shipper, MD;  Location: [place];  Service: Endoscopy;  Laterality: N/A;  . Left heart catheterization with coronary/graft angiogram  03/11/2014    Procedure: LEFT HEART CATHETERIZATION WITH Beatrix Fetters;  Surgeon: Peter M Martinique, MD;  Location[facility] CATH LAB;  Service: Cardiovascular;;     Family History  Problem Relation Age of Onset  . Diabetes Father   . Heart attack Mother   . Diabetes    . Coronary artery disease    . Colon cancer Neg Hx      History   Social History  . Marital Status: Widowed    Spouse Name: N/A  . Number of Children: 2  . Years of Education: 12   Occupational History  . Receptionist    Social History Main Topics  . Smoking status: Former Smoker -- 1.50 packs/day for 40 years    Types: Cigarettes    Quit date: 06/29/2000  . Smokeless tobacco: Never Used  . Alcohol Use: No  . Drug Use: No  . Sexual Activity: Not on file   Other Topics Concern  . Not on file   Social History Narrative   Patient is widowed with 2 children.   Patient is right handed.   Patient has hs education.   Patient drinks 2 cups  daily.     BP 134/58 mmHg  Pulse 52  Ht 5' 2.5" (1.588 m)  Wt 142 lb 6.4 oz (64.592 kg)  BMI 25.61 kg/m2  SpO2 97%  Physical Exam: stable appearing 74 yo woman, NAD HEENT: Unremarkable Neck:  No JVD, no thyromegally Back:  No CVA tenderness Lungs:  Clear with scattered rales. Decreased breath sounds. HEART:  Regular rate rhythm, no murmurs, no rubs, no clicks Abd:  soft, positive bowel sounds, no organomegally, no rebound, no guarding Ext:  2 plus pulses, no edema, no cyanosis, no clubbing Skin:  No rashes no nodules Neuro:  CN II through XII intact, motor grossly intact  EKG - NSR at 65/min.  Echo 04/11/2014  Left ventricle: The cavity size was normal. Wall thickness was normal. Systolic function was normal. The estimated ejection fraction was in the range of 50% to 55%. Moderate hypokinesis of the mid-distalanteroseptal myocardium. Features are consistent with a pseudonormal left ventricular filling pattern, with concomitant abnormal relaxation and increased filling pressure (grade 2 diastolic dysfunction). - Aortic valve: Mildly to moderately calcified annulus. Mildly calcified leaflets. Valve area: 0.96cm^2(VTI). Valve area: 1cm^2 (Vmax). - Mitral valve: Mild regurgitation. - Left atrium: The atrium was severely dilated. - Right ventricle: The cavity size was mildly dilated. Systolic function was mildly reduced. - Pulmonary arteries: Systolic pressure was mildly increased. PA peak pressure: 14mm Hg (S).    Assess/Plan:  1. Acute on chronic diastolic heart failure (EF 50-55%)  - Lasix, 20 mg once a day, extra 20 mg if needed, new dry weight is 141 lbs - improved SOB, no need for day use of home O2  2. Demand ischemia while in a-flutter with RVR-  - mildly elevated troponin.  - No cardiac catheterization, no symptoms of unstable angina.  - Low-dose aspirin. Continue with beta blocker.   3. COPD  - Chronic, playing  a role in her dyspnea.  - Continue with  oxygen therapy only at night. Continue COPD regimen.   4. Paroxysmal atrial fibrillation  - Currently sinus rhythm on amiodarone, low-dose, decrease to 200 mg po daily, follow TSH today - Maintaining sinus rhythm. Cardizem CD 120 as well.  Her atrial arrhythmias appear to be well-controlled. She will continue low-dose amiodarone therapy. - Hb stable, we will restart anticoagulation with apixaban (Eliquis 5 mg po BID), she was given 2 months of free supply  5. Sinus bradycardia  - she is asymptomatic  6. Coronary artery disease  - Bypass 2001, stable. Elevated troponin secondary to demand ischemia.   7. Peripheral vascular disease  - Aortobifemoral in 2005, stable. Pletal -stopped given her history of diastolic heart failure and potential bleeding. Continue aspirin if possible.   8. Anemia-GI seeing. Stable Hb 11.2 on 12/2, restarted Eliquis  Follow up in 6 months.   Dorothy Spark 05/21/2015

## 2015-05-21 NOTE — Patient Instructions (Signed)
Medication Instructions:  Your physician recommends that you continue on your current medications as directed. Please refer to the Current Medication list given to you today.   Labwork: Bmet, Tsh, Lft, Bnp  Testing/Procedures: None   Follow-Up: Your physician wants you to follow-up in: 6 months with Dr.Nelson You will receive a reminder letter in the mail two months in advance. If you don't receive a letter, please call our office to schedule the follow-up appointment.   Any Other Special Instructions Will Be Listed Below (If Applicable).

## 2015-05-22 ENCOUNTER — Telehealth: Payer: Self-pay

## 2015-05-22 DIAGNOSIS — Z79899 Other long term (current) drug therapy: Secondary | ICD-10-CM

## 2015-05-22 NOTE — Telephone Encounter (Signed)
-----   Message from Dorothy Spark, MD sent at 05/22/2015  1:43 PM EDT ----- Stable labs, mildly elevated TSH, please oredr free T3 and free T4.

## 2015-05-22 NOTE — Telephone Encounter (Signed)
Pt aware of lab results and Dr. Francesca Oman recommendations. Stable labs, mildly elevated TSH, please oredr free T3 and free T4. Lab appt. 05/26/15, pt verbalized understanding.

## 2015-05-23 ENCOUNTER — Telehealth: Payer: Self-pay | Admitting: Internal Medicine

## 2015-05-23 NOTE — Telephone Encounter (Signed)
New problem    Pt stated she is returning a message from nurse.

## 2015-05-23 NOTE — Telephone Encounter (Signed)
Patient is not sure who called her for what. Informed patient the only recent note on her chart is for her labs. Reminded patient of  her labs on Monday. Patient said something about Dr. Lovena Le calling, when looking at her recalls, she has a recall coming up for Dr. Lovena Le. Sent message to [facility], Dr. Tanna Furry scheduler to see if she had called patient. Informed patient that no other call is in her chart and someone must have been calling to schedule. Patient verbalized understanding and said she would listen to her message.

## 2015-05-26 ENCOUNTER — Other Ambulatory Visit (INDEPENDENT_AMBULATORY_CARE_PROVIDER_SITE_OTHER): Payer: Medicare Other | Admitting: *Deleted

## 2015-05-26 ENCOUNTER — Other Ambulatory Visit: Payer: Self-pay

## 2015-05-26 DIAGNOSIS — Z79899 Other long term (current) drug therapy: Secondary | ICD-10-CM | POA: Diagnosis not present

## 2015-05-26 LAB — T3, FREE: T3, Free: 2.9 pg/mL (ref 2.3–4.2)

## 2015-05-26 LAB — T4, FREE: Free T4: 0.78 ng/dL (ref 0.60–1.60)

## 2015-05-26 NOTE — Addendum Note (Signed)
Addended by: Briant Cedar on: 05/26/2015 07:51 AM   Modules accepted: Orders

## 2015-05-27 ENCOUNTER — Other Ambulatory Visit: Payer: Self-pay | Admitting: Internal Medicine

## 2015-06-17 ENCOUNTER — Other Ambulatory Visit: Payer: Self-pay | Admitting: Internal Medicine

## 2015-06-21 ENCOUNTER — Other Ambulatory Visit: Payer: Self-pay | Admitting: Internal Medicine

## 2015-07-16 ENCOUNTER — Other Ambulatory Visit: Payer: Self-pay | Admitting: Adult Health

## 2015-07-16 NOTE — Telephone Encounter (Signed)
I do not see a recent lipid/liver. Please advise on refill. Thanks, MI 

## 2015-07-19 ENCOUNTER — Other Ambulatory Visit: Payer: Self-pay | Admitting: Internal Medicine

## 2015-07-30 ENCOUNTER — Other Ambulatory Visit: Payer: Self-pay | Admitting: Internal Medicine

## 2015-08-06 LAB — HM MAMMOGRAPHY: HM Mammogram: NEGATIVE

## 2015-08-07 ENCOUNTER — Encounter: Payer: Self-pay | Admitting: Internal Medicine

## 2015-08-18 ENCOUNTER — Ambulatory Visit (INDEPENDENT_AMBULATORY_CARE_PROVIDER_SITE_OTHER)
Admission: RE | Admit: 2015-08-18 | Discharge: 2015-08-18 | Disposition: A | Discharge status: Home / Self Care | Payer: Medicare Other | Source: Ambulatory Visit | Attending: Adult Health | Admitting: Adult Health

## 2015-08-18 ENCOUNTER — Ambulatory Visit (INDEPENDENT_AMBULATORY_CARE_PROVIDER_SITE_OTHER): Payer: Medicare Other | Admitting: Adult Health

## 2015-08-18 ENCOUNTER — Encounter: Payer: Self-pay | Admitting: Adult Health

## 2015-08-18 VITALS — BP 124/84 | HR 55 | Temp 97.4°F | Ht 62.0 in | Wt 142.0 lb

## 2015-08-18 DIAGNOSIS — Z23 Encounter for immunization: Secondary | ICD-10-CM

## 2015-08-18 DIAGNOSIS — J4489 Other specified chronic obstructive pulmonary disease: Secondary | ICD-10-CM

## 2015-08-18 DIAGNOSIS — J9621 Acute and chronic respiratory failure with hypoxia: Secondary | ICD-10-CM | POA: Insufficient documentation

## 2015-08-18 DIAGNOSIS — J9611 Chronic respiratory failure with hypoxia: Secondary | ICD-10-CM | POA: Diagnosis not present

## 2015-08-18 DIAGNOSIS — J449 Chronic obstructive pulmonary disease, unspecified: Secondary | ICD-10-CM

## 2015-08-18 NOTE — Progress Notes (Signed)
   Subjective:    Patient ID: [name], female    DOB: March 05, 1941, 74 y.o.   MRN: [id number]  HPI 74 yo GOLD B COPD (FEV1 73%) female former smoker   08/18/2015 Follow up : COPD and O2 At bedtime   Pt returns for follow up 1 year .  Remains on Spiriva. Only takes every 2-3 days.   Says she feels okay, no flare of dyspnea or cough .  Remains active , working partime at [employer] .  Agrees to flu shot today.  She is on O2 At bedtime at 2l/m .  She follows with cards for Atrial Fib and CAD , on amiodarone /eliquis and cardizem .  She has grade 2 DD , severe LA dilation and PAP at 44, EF 50-55%.  Does have some dyspnea  on/off, at baseline.    Review of Systems Constitutional:   No  weight loss, night sweats,  Fevers, chills,  +fatigue, or  lassitude.  HEENT:   No headaches,  Difficulty swallowing,  Tooth/dental problems, or  Sore throat,                No sneezing, itching, ear ache, nasal congestion, post nasal drip,   CV:  No chest pain,  Orthopnea, PND, swelling in lower extremities, anasarca, dizziness, palpitations, syncope.   GI  No heartburn, indigestion, abdominal pain, nausea, vomiting, diarrhea, change in bowel habits, loss of appetite, bloody stools.   Resp:    No excess mucus, no productive cough,  No non-productive cough,  No coughing up of blood.  No change in color of mucus.  No wheezing.  No chest wall deformity  Skin: no rash or lesions.  GU: no dysuria, change in color of urine, no urgency or frequency.  No flank pain, no hematuria   MS:  No joint pain or swelling.  No decreased range of motion.  No back pain.  Psych:  No change in mood or affect. No depression or anxiety.  No memory loss.         Objective:   Physical Exam GEN: A/Ox3; pleasant , NAD elderly   HEENT:  [place]/AT,  EACs-clear, TMs-wnl, NOSE-clear, THROAT-clear, no lesions, no postnasal drip or exudate noted.   NECK:  Supple w/ fair ROM; no JVD; normal carotid impulses w/o bruits; no  thyromegaly or nodules palpated; no lymphadenopathy.  RESP  Decreased BS in bases .no accessory muscle use, no dullness to percussion  CARD:  RRR, no m/r/g  , no peripheral edema, pulses intact, no cyanosis or clubbing.  GI:   Soft & nt; nml bowel sounds; no organomegaly or masses detected.  Musco: Warm bil, no deformities or joint swelling noted.   Neuro: alert, no focal deficits noted.    Skin: Warm, no lesions or rashes         Assessment & Plan:

## 2015-08-18 NOTE — Assessment & Plan Note (Addendum)
Controlled , doing well on current regimen  Check cxr today   Plan  Continue on Spiriva .  Flu shot today .  Chest xray today .  Keep up good work .  Follow up with Dr. Ashok Cordia in 6 months and As needed

## 2015-08-18 NOTE — Patient Instructions (Signed)
Continue on Spiriva .  Flu shot today .  Chest xray today .  Keep up good work .  Follow up with Dr. Ashok Cordia in 6 months and As needed

## 2015-08-18 NOTE — Assessment & Plan Note (Signed)
Cont on O2 At bedtime   

## 2015-08-19 ENCOUNTER — Ambulatory Visit (INDEPENDENT_AMBULATORY_CARE_PROVIDER_SITE_OTHER): Payer: Medicare Other | Admitting: Internal Medicine

## 2015-08-19 ENCOUNTER — Other Ambulatory Visit: Payer: Self-pay | Admitting: Internal Medicine

## 2015-08-19 ENCOUNTER — Encounter: Payer: Self-pay | Admitting: Internal Medicine

## 2015-08-19 VITALS — BP 156/72 | HR 56 | Ht 62.0 in | Wt 144.2 lb

## 2015-08-19 DIAGNOSIS — I10 Essential (primary) hypertension: Secondary | ICD-10-CM

## 2015-08-19 DIAGNOSIS — I480 Paroxysmal atrial fibrillation: Secondary | ICD-10-CM

## 2015-08-19 DIAGNOSIS — I5033 Acute on chronic diastolic (congestive) heart failure: Secondary | ICD-10-CM | POA: Diagnosis not present

## 2015-08-19 MED ORDER — AMIODARONE HCL 200 MG PO TABS: ORAL_TABLET | ORAL | Status: DC

## 2015-08-19 NOTE — Progress Notes (Signed)
HPI [name] returns today for ongoing evaluation of atrial fibrillation and atrial flutter. She has an extensive past medical history with severe vascular disease including CAD, s/p CABG, peripheral vascular disease with subclavian artery bypass, HTN, and dyslipidemia. In the interim she developed atrial flutter with an RVR and she was placed on systemic anti-coagulation and started on amiodarone.  She feels better and appears to have maintained NSR. No other complaints.  Allergies  Allergen Reactions  . Codeine Phosphate     unknown     Current Outpatient Prescriptions  Medication Sig Dispense Refill  . albuterol (PROVENTIL HFA;VENTOLIN HFA) 108 (90 BASE) MCG/ACT inhaler Inhale 2 puffs into the lungs every 4 (four) hours as needed for wheezing or shortness of breath.    Marland Kitchen amiodarone (PACERONE) 200 MG tablet Take 1 tablet (200 mg total) by mouth daily. 30 tablet 3  . apixaban (ELIQUIS) 2.5 MG TABS tablet Take 2.5 mg by mouth 2 (two) times daily.    Marland Kitchen aspirin EC 81 MG EC tablet Take 1 tablet (81 mg total) by mouth daily. 30 tablet 0  . atorvastatin (LIPITOR) 40 MG tablet TAKE 1 TABLET (40 MG TOTAL) BY MOUTH EVERY EVENING. 90 tablet 1  . diltiazem (CARDIZEM CD) 120 MG 24 hr capsule Take 1 capsule (120 mg total) by mouth daily. 30 capsule 11  . furosemide (LASIX) 20 MG tablet Take 1 tablet (20 mg total) by mouth daily. 90 tablet 3  . gabapentin (NEURONTIN) 600 MG tablet TAKE 1 TABLET (600 MG TOTAL) BY MOUTH 3 (THREE) TIMES DAILY. 270 tablet 3  . glucose blood (ACCU-CHEK AVIVA PLUS) test strip Test daily. 100 each 5  . HYDROcodone-acetaminophen (NORCO/VICODIN) 5-325 MG per tablet Take 0.5 tablets by mouth every 6 (six) hours as needed for moderate pain. 90 tablet 0  . KLOR-CON 10 10 MEQ tablet TAKE 1 TABLET (10 MEQ TOTAL) BY MOUTH DAILY. 30 tablet 6  . LORazepam (ATIVAN) 0.5 MG tablet TAKE ONE TABLET BY MOUTH TWICE DAILY 60 tablet 2  . metFORMIN (GLUCOPHAGE) 500 MG tablet Take 1  tablet (500 mg total) by mouth 2 (two) times daily with a meal. 180 tablet 3  . metoprolol tartrate (LOPRESSOR) 25 MG tablet Take 1 tablet (25 mg total) by mouth 2 (two) times daily. 180 tablet 3  . Multiple Vitamins-Minerals (CENTRUM SILVER ULTRA WOMENS PO) Take 1 capsule by mouth daily.     . nitroGLYCERIN (NITROSTAT) 0.4 MG SL tablet Place 0.4 mg under the tongue every 5 (five) minutes as needed for chest pain.    . promethazine (PHENERGAN) 25 MG tablet TAKE 1 TABLET BY MOUTH EVERY 6 HOURS AS NEEDED FOR NAUSEA OR VOMITING 20 tablet 2  . sertraline (ZOLOFT) 50 MG tablet Take 25 mg by mouth daily.    Marland Kitchen SPIRIVA HANDIHALER 18 MCG inhalation capsule PLACE 1 CAPSULE (18 MCG TOTAL) INTO INHALER AND INHALE DAILY. 30 capsule 5   No current facility-administered medications for this visit.     Past Medical History  Diagnosis Date  . ANEMIA 08/28/2010  . CAD (coronary artery disease)     a. s/p CABGx4 in 2001.  . Adenomatous colon polyp 2000  . COPD (chronic obstructive pulmonary disease)   . Depressive disorder   . GERD 12/19/2009  . Hyperlipidemia   . Hypertension   . HYPOTENSION 08/11/2010  . Osteoarthritis     Lumbar stenosis  . PVD (peripheral vascular disease)     a. Duplex 03/2013: stable moderate  carotid dz - 05-11% RICA, 02-11% LICA, L subclavian artery to CCA stent widely patent.  b. Prior R fempop bypass graft, R CIA stent.  . Lung abscess 2011  . CHF (congestive heart failure)   . Diverticulosis   . Hiatal hernia     a. Dx 01/2014, placed on Xarelto.  . Aortic stenosis     a. Mild by echo 01/2013.  . Diabetes mellitus without complication   . Ringing in ears   . Anxiety   . Joint pain     ROS:   All systems reviewed and negative except as noted in the HPI.   Past Surgical History  Procedure Laterality Date  . L subclavian bypass  07/2004  . Tubal ligation    . Coronary artery bypass graft  05/2000    LIMA-LAD, SVG-OM, SVG-PDA-PL  . Femoral popliteal bypass-r  04/1999    . Right common iliac pta with stent placement  07/2000  . Right leg blockage  2003  . Aorta bifemoral bypass graft  2004  . Left cea  2005  . Lung cyst biopsy  2011  . Esophagogastroduodenoscopy N/A 08/03/2014    Procedure: ESOPHAGOGASTRODUODENOSCOPY (EGD);  Surgeon: Juanita Craver, MD;  Location: [facility] ENDOSCOPY;  Service: Endoscopy;  Laterality: N/A;  . Colonoscopy Left 08/05/2014    Procedure: COLONOSCOPY;  Surgeon: Juanita Craver, MD;  Location: [place];  Service: Endoscopy;  Laterality: Left;  Freda Munro capsule study N/A 08/06/2014    Procedure: GIVENS CAPSULE STUDY;  Surgeon: Irene Shipper, MD;  Location: [place];  Service: Endoscopy;  Laterality: N/A;  . Left heart catheterization with coronary/graft angiogram  03/11/2014    Procedure: LEFT HEART CATHETERIZATION WITH Beatrix Fetters;  Surgeon: Peter M Martinique, MD;  Location: [facility] CATH LAB;  Service: Cardiovascular;;     Family History  Problem Relation Age of Onset  . Diabetes Father   . Heart attack Mother   . Diabetes    . Coronary artery disease    . Colon cancer Neg Hx      Social History   Social History  . Marital Status: Widowed    Spouse Name: N/A  . Number of Children: 2  . Years of Education: 12   Occupational History  . Receptionist    Social History Main Topics  . Smoking status: Former Smoker -- 1.50 packs/day for 40 years    Types: Cigarettes    Quit date: 06/29/2000  . Smokeless tobacco: Never Used  . Alcohol Use: No  . Drug Use: No  . Sexual Activity: Not on file   Other Topics Concern  . Not on file   Social History Narrative   Patient is widowed with 2 children.   Patient is right handed.   Patient has hs education.   Patient drinks 2 cups daily.     BP 156/72 mmHg  Pulse 56  Ht 5\' 2"  (1.575 m)  Wt 144 lb 3.2 oz (65.409 kg)  BMI 26.37 kg/m2  Physical Exam:  stable appearing 74 yo woman, NAD HEENT: Unremarkable Neck:  No JVD, no thyromegally Back:  No CVA tenderness Lungs:   Clear with scattered rales. Decreased breath sounds. HEART:  Regular rate rhythm, no murmurs, no rubs, no clicks Abd:  soft, positive bowel sounds, no organomegally, no rebound, no guarding Ext:  2 plus pulses, no edema, no cyanosis, no clubbing Skin:  No rashes no nodules Neuro:  CN II through XII intact, motor grossly intact  EKG - nsr at 65/min.  Assess/Plan:

## 2015-08-19 NOTE — Assessment & Plan Note (Signed)
Her symptoms are well compensated class 2. Will follow.

## 2015-08-19 NOTE — Assessment & Plan Note (Signed)
Her blood pressure is elevated. She is encouraged to reduce her sodium intake. Will follow.

## 2015-08-19 NOTE — Patient Instructions (Addendum)
Medication Instructions:  Your physician has recommended you make the following change in your medication:  1) Decrease Amiodarone---Take 1 tablet by mouth daily except on Sat and Sun take 1/2 tablet by mouth   Labwork: None ordered  Testing/Procedures: None ordered  Follow-Up: Your physician wants you to follow-up in:  12 months with Dr Knox Saliva will receive a reminder letter in the mail two months in advance. If you don't receive a letter, please call our office to schedule the follow-up appointment.   Any Other Special Instructions Will Be Listed Below (If Applicable).

## 2015-08-19 NOTE — Assessment & Plan Note (Signed)
Her atrial fib has been well controlled. She will continue her current meds. We will gradually wean down her dose of amio.

## 2015-08-22 NOTE — Progress Notes (Signed)
Quick Note:  Called spoke with patient, advised of cxr results / recs as stated by TP. Pt verbalized her understanding and denied any questions. ______ 

## 2015-09-19 ENCOUNTER — Other Ambulatory Visit: Payer: Self-pay | Admitting: Cardiology

## 2015-09-29 ENCOUNTER — Ambulatory Visit (INDEPENDENT_AMBULATORY_CARE_PROVIDER_SITE_OTHER): Payer: Medicare Other | Admitting: Internal Medicine

## 2015-09-29 ENCOUNTER — Encounter: Payer: Self-pay | Admitting: Internal Medicine

## 2015-09-29 VITALS — BP 150/60 | HR 49 | Temp 97.7°F | Resp 16 | Ht 62.0 in | Wt 145.0 lb

## 2015-09-29 DIAGNOSIS — J449 Chronic obstructive pulmonary disease, unspecified: Secondary | ICD-10-CM | POA: Diagnosis not present

## 2015-09-29 DIAGNOSIS — E785 Hyperlipidemia, unspecified: Secondary | ICD-10-CM | POA: Diagnosis not present

## 2015-09-29 DIAGNOSIS — I739 Peripheral vascular disease, unspecified: Secondary | ICD-10-CM

## 2015-09-29 DIAGNOSIS — I10 Essential (primary) hypertension: Secondary | ICD-10-CM | POA: Diagnosis not present

## 2015-09-29 MED ORDER — DICLOFENAC SODIUM 1 % TD CREA: 1.0000 "application " | TOPICAL_CREAM | Freq: Two times a day (BID) | TRANSDERMAL | Status: AC

## 2015-09-29 NOTE — Progress Notes (Signed)
Pre visit review using our clinic review tool, if applicable. No additional management support is needed unless otherwise documented below in the visit note. 

## 2015-09-29 NOTE — Patient Instructions (Signed)
Limit your sodium (Salt) intake    It is important that you exercise regularly, at least 20 minutes 3 to 4 times per week.  If you develop chest pain or shortness of breath seek  medical attention.  Return in 6 months for follow-up  

## 2015-09-29 NOTE — Progress Notes (Signed)
Subjective:    Patient ID: [name], female    DOB: 1941/08/10, 74 y.o.   MRN: [id number]  HPI  Lab Results  Component Value Date   HGBA1C 6.1 04/02/2015   74 year old patient who is seen today for her by annual follow-up.  He is followed closely by cardiology and pulmonary medicine.  She has had a recent dermatologic evaluation.  She is doing quite well. She still works 25-30 hours per week Cardiopulmonary status has been stable She has advanced PAD.  Denies any claudication  She has a history of significant osteoarthritis and is complaining of several painful joints of the hands.  She is on chronic anticoagulation  Past Medical History  Diagnosis Date  . ANEMIA 08/28/2010  . CAD (coronary artery disease)     a. s/p CABGx4 in 2001.  . Adenomatous colon polyp 2000  . COPD (chronic obstructive pulmonary disease) ([place])   . Depressive disorder   . GERD 12/19/2009  . Hyperlipidemia   . Hypertension   . HYPOTENSION 08/11/2010  . Osteoarthritis     Lumbar stenosis  . PVD (peripheral vascular disease) ([place])     a. Duplex 03/2013: stable moderate carotid dz - 35-59% RICA, 74-16% LICA, L subclavian artery to CCA stent widely patent.  b. Prior R fempop bypass graft, R CIA stent.  . Lung abscess ([place]) 2011  . CHF (congestive heart failure) ([place])   . Diverticulosis   . Hiatal hernia     a. Dx 01/2014, placed on Xarelto.  . Aortic stenosis     a. Mild by echo 01/2013.  . Diabetes mellitus without complication ([place])   . Ringing in ears   . Anxiety   . Joint pain     Social History   Social History  . Marital Status: Widowed    Spouse Name: N/A  . Number of Children: 2  . Years of Education: 12   Occupational History  . Receptionist    Social History Main Topics  . Smoking status: Former Smoker -- 1.50 packs/day for 40 years    Types: Cigarettes    Quit date: 06/29/2000  . Smokeless tobacco: Never Used  . Alcohol Use: No  . Drug Use: No  . Sexual Activity: Not on  file   Other Topics Concern  . Not on file   Social History Narrative   Patient is widowed with 2 children.   Patient is right handed.   Patient has hs education.   Patient drinks 2 cups daily.    Past Surgical History  Procedure Laterality Date  . L subclavian bypass  07/2004  . Tubal ligation    . Coronary artery bypass graft  05/2000    LIMA-LAD, SVG-OM, SVG-PDA-PL  . Femoral popliteal bypass-r  04/1999  . Right common iliac pta with stent placement  07/2000  . Right leg blockage  2003  . Aorta bifemoral bypass graft  2004  . Left cea  2005  . Lung cyst biopsy  2011  . Esophagogastroduodenoscopy N/A 08/03/2014    Procedure: ESOPHAGOGASTRODUODENOSCOPY (EGD);  Surgeon: Juanita Craver, MD;  Location[facility] ENDOSCOPY;  Service: Endoscopy;  Laterality: N/A;  . Colonoscopy Left 08/05/2014    Procedure: COLONOSCOPY;  Surgeon: Juanita Craver, MD;  Location: [place];  Service: Endoscopy;  Laterality: Left;  Freda Munro capsule study N/A 08/06/2014    Procedure: GIVENS CAPSULE STUDY;  Surgeon: Irene Shipper, MD;  Location: [place];  Service: Endoscopy;  Laterality: N/A;  . Left heart  catheterization with coronary/graft angiogram  03/11/2014    Procedure: LEFT HEART CATHETERIZATION WITH Beatrix Fetters;  Surgeon: Calyssa Zobrist M Martinique, MD;  Location: [facility] CATH LAB;  Service: Cardiovascular;;    Family History  Problem Relation Age of Onset  . Diabetes Father   . Heart attack Mother   . Diabetes    . Coronary artery disease    . Colon cancer Neg Hx     Allergies  Allergen Reactions  . Codeine Phosphate     unknown    Current Outpatient Prescriptions on File Prior to Visit  Medication Sig Dispense Refill  . albuterol (PROVENTIL HFA;VENTOLIN HFA) 108 (90 BASE) MCG/ACT inhaler Inhale 2 puffs into the lungs every 4 (four) hours as needed for wheezing or shortness of breath.    Marland Kitchen apixaban (ELIQUIS) 2.5 MG TABS tablet Take 2.5 mg by mouth 2 (two) times daily.    Marland Kitchen aspirin EC 81 MG EC tablet  Take 1 tablet (81 mg total) by mouth daily. 30 tablet 0  . atorvastatin (LIPITOR) 40 MG tablet TAKE 1 TABLET (40 MG TOTAL) BY MOUTH EVERY EVENING. 90 tablet 1  . diltiazem (CARDIZEM CD) 120 MG 24 hr capsule TAKE 1 CAPSULE (120 MG TOTAL) BY MOUTH DAILY. 30 capsule 5  . furosemide (LASIX) 20 MG tablet Take 1 tablet (20 mg total) by mouth daily. 90 tablet 3  . gabapentin (NEURONTIN) 600 MG tablet TAKE 1 TABLET (600 MG TOTAL) BY MOUTH 3 (THREE) TIMES DAILY. 270 tablet 3  . glucose blood (ACCU-CHEK AVIVA PLUS) test strip Test daily. 100 each 5  . HYDROcodone-acetaminophen (NORCO/VICODIN) 5-325 MG per tablet Take 0.5 tablets by mouth every 6 (six) hours as needed for moderate pain. 90 tablet 0  . KLOR-CON 10 10 MEQ tablet TAKE 1 TABLET (10 MEQ TOTAL) BY MOUTH DAILY. 30 tablet 6  . LORazepam (ATIVAN) 0.5 MG tablet TAKE ONE TABLET BY MOUTH TWICE A DAY 60 tablet 2  . metFORMIN (GLUCOPHAGE) 500 MG tablet Take 1 tablet (500 mg total) by mouth 2 (two) times daily with a meal. 180 tablet 3  . metoprolol tartrate (LOPRESSOR) 25 MG tablet Take 1 tablet (25 mg total) by mouth 2 (two) times daily. 180 tablet 3  . Multiple Vitamins-Minerals (CENTRUM SILVER ULTRA WOMENS PO) Take 1 capsule by mouth daily.     . nitroGLYCERIN (NITROSTAT) 0.4 MG SL tablet Place 0.4 mg under the tongue every 5 (five) minutes as needed for chest pain.    . promethazine (PHENERGAN) 25 MG tablet TAKE 1 TABLET BY MOUTH EVERY 6 HOURS AS NEEDED FOR NAUSEA OR VOMITING 20 tablet 2  . sertraline (ZOLOFT) 50 MG tablet Take 25 mg by mouth daily.    Marland Kitchen SPIRIVA HANDIHALER 18 MCG inhalation capsule PLACE 1 CAPSULE (18 MCG TOTAL) INTO INHALER AND INHALE DAILY. 30 capsule 5  . amiodarone (PACERONE) 200 MG tablet Take 1 tablet by mouth daily except on Sat and Sun take 1/2 tablet by mouth (Patient not taking: Reported on 09/29/2015) 30 tablet 3   No current facility-administered medications on file prior to visit.    BP 150/60 mmHg  Pulse 49   Temp(Src) 97.7 F (36.5 C) (Oral)  Resp 16  Ht 5\' 2"  (1.575 m)  Wt 145 lb (65.772 kg)  BMI 26.51 kg/m2      Review of Systems  Constitutional: Negative.   HENT: Negative for congestion, dental problem, hearing loss, rhinorrhea, sinus pressure, sore throat and tinnitus.   Eyes: Negative for pain, discharge and  visual disturbance.  Respiratory: Negative for cough and shortness of breath.   Cardiovascular: Negative for chest pain, palpitations and leg swelling.  Gastrointestinal: Negative for nausea, vomiting, abdominal pain, diarrhea, constipation, blood in stool and abdominal distention.  Genitourinary: Negative for dysuria, urgency, frequency, hematuria, flank pain, vaginal bleeding, vaginal discharge, difficulty urinating, vaginal pain and pelvic pain.  Musculoskeletal: Negative for joint swelling, arthralgias and gait problem.  Skin: Negative for rash.  Neurological: Negative for dizziness, syncope, speech difficulty, weakness, numbness and headaches.  Hematological: Negative for adenopathy.  Psychiatric/Behavioral: Negative for behavioral problems, dysphoric mood and agitation. The patient is not nervous/anxious.        Objective:   Physical Exam  Constitutional: She is oriented to person, place, and time. She appears well-developed and well-nourished.  HENT:  Head: Normocephalic.  Right Ear: External ear normal.  Left Ear: External ear normal.  Mouth/Throat: Oropharynx is clear and moist.  Eyes: Conjunctivae and EOM are normal. Pupils are equal, round, and reactive to light.  Neck: Normal range of motion. Neck supple. No thyromegaly present.  Cardiovascular: Normal rate, regular rhythm and normal heart sounds.   Pedal pulses not easily palpable  Pulmonary/Chest: Effort normal and breath sounds normal.  Abdominal: Soft. Bowel sounds are normal. She exhibits no mass. There is no tenderness.  Musculoskeletal: Normal range of motion.  Marked osteoarthritic joint changes of  the small joints of both hands  Lymphadenopathy:    She has no cervical adenopathy.  Neurological: She is alert and oriented to person, place, and time.  Skin: Skin is warm and dry. No rash noted.  Psychiatric: She has a normal mood and affect. Her behavior is normal.          Assessment & Plan:  Essential hypertension COPD Coronary artery disease Peripheral artery disease CVD Osteoarthritis.  Trial of diclofenac cream  Stable cardiopulmonary status.  Patient has had her flu vaccine Follow-up cardiology and pulmonary medicine Recheck here 6 months for her annual exam

## 2015-10-01 ENCOUNTER — Telehealth: Payer: Self-pay | Admitting: Internal Medicine

## 2015-10-01 NOTE — Telephone Encounter (Signed)
Pt call to say that her insurance will not cover the following med            Patient Care Coordination Note Alexandra Sorrow, LPN Wed Dec 11, 3293 8:42 AM  DME - [facility] 2 lpm 24/7         Demographics  [name] 74 year old female Jan 23, 1941  [place] [address] [phone] (H)  [place]:      Advanced Directives  08/18/15 04/29/15 02/12/15  Does patient have an advance directive? Yes Yes Yes  Type of Advance Directive Healthcare Power of [place], Living will Healthcare Power of [place], Living will Healthcare Power of Attorney, Living will  Copy of advanced directive(s) in chart?  Yes   Problem List  Hyperlipidemia   Anemia   DEPRESSIVE DISORDER   Essential hypertension   CAD, s/p CABG 2001   PVD, s/p AOBF '04, Lt SCA BPG 9/05   COPD with chronic bronchitis ([place])   GERD   Osteoarthritis   PEDAL EDEMA   COLONIC POLYPS, HX OF   Cerebrovascular disease   Aortic stenosis- mild by echo 02/15/13   SVT (supraventricular tachycardia) (HCC)   Paroxysmal atrial flutter (HCC)   Pre-syncope   Syncope   CVA (cerebral infarction)   Diabetes ([place])   Embolic stroke ([place])   Demand ischemia (HCC)   CHF (congestive heart failure), NYHA class II (HCC)   Low oxygen saturation   Atrial fibrillation ([place])   UTI (urinary tract infection)   Acute on chronic diastolic CHF (congestive heart failure), NYHA class 2 ([place])   Bilateral carotid artery disease (HCC)   HLD (hyperlipidemia)   Acute on chronic diastolic heart failure ([place])   Coronary artery disease due to lipid rich plaque   PAD (peripheral artery disease) ([place])   Respiratory failure, chronic ([place])  Health MaintenancePostponedSoonDueLate        Topic Due Last Communication    OPHTHALMOLOGY EXAM 10/15/1951     URINE MICROALBUMIN 10/15/1951 05/26/2015    DEXA SCAN 10/14/2006     HEMOGLOBIN A1C 10/03/2015     FOOT EXAM 12/12/2015     INFLUENZA  VACCINE 06/29/2016     MAMMOGRAM 08/05/2016     TETANUS/TDAP 09/15/2021     COLONOSCOPY 08/05/2024     ZOSTAVAX Completed     PNA vac Low Risk Adult Completed    Reminders and Results None     Care Team and Communications PCPs Type  Marletta Lor, MD General  Other Patient Care Team Members Relationship  Elsie Stain, MD   Recipients of Past Communications   Hospital Encounter - 08/12/2014    Marletta Lor, MD 08/12/2014 In [facility] Encounter - 08/01/2014    [place] 08/07/2014 Fax  Marletta Lor, MD 08/02/2014 In Basket  Nutrition - 06/18/2014    Marletta Lor, MD 06/18/2014 In Basket  Nutrition - 06/11/2014    Marletta Lor, MD 06/11/2014 In Basket  Nutrition - 06/04/2014    Marletta Lor, MD 06/04/2014 In [facility] Encounter - 04/09/2014    Marletta Lor, MD 04/09/2014 In Basket   Date of Birth 1941-11-28 Allergies   ^ [id number] RVAMP LAUNCH_OPTIONS;1 RVAMP " data-rvlinknum="3">Codeine Phosphate  Medications Outpatient Medications (21) Hospital Medications (0) Clinic-Administered Medications (0)   albuterol (PROVENTIL HFA;VENTOLIN HFA) 108 (90 BASE) MCG/ACT inhaler   amiodarone (PACERONE) 200 MG tablet   apixaban (ELIQUIS) 2.5 MG TABS  tablet   aspirin EC 81 MG EC tablet   atorvastatin (LIPITOR) 40 MG tablet   Diclofenac Sodium 1 % CREA   diltiazem (CARDIZEM CD) 120 MG 24 hr capsule   furosemide (LASIX) 20 MG tablet   gabapentin (NEURONTIN) 600 MG tablet   glucose blood (ACCU-CHEK AVIVA PLUS) test strip   HYDROcodone-acetaminophen (NORCO/VICODIN) 5-325 MG per tablet   KLOR-CON 10 10 MEQ tablet   LORazepam (ATIVAN) 0.5 MG tablet   metFORMIN (GLUCOPHAGE) 500 MG tablet   metoprolol tartrate (LOPRESSOR) 25 MG tablet   Multiple Vitamins-Minerals (CENTRUM SILVER ULTRA WOMENS PO)   nitroGLYCERIN (NITROSTAT) 0.4 MG SL tablet   promethazine (PHENERGAN) 25 MG tablet    sertraline (ZOLOFT) 50 MG tablet   SPIRIVA HANDIHALER 18 MCG inhalation capsule   triamcinolone cream (KENALOG) 0.1 %     Preferred Pharmacies    [id number] - [place], [place] - [address] AT [place] [phone] (Phone) [phone] (Fax)  [facility] [place], [place] - [address] [phone] (Phone) [phone] (Fax)  Immunizations/Injections    Influenza Split 07/27/2013, 08/09/2012, 09/16/2011  Influenza Whole 08/29/2010, 09/08/2009, 10/16/2007  Influenza, High Dose Seasonal PF 09/10/2014  Influenza,inj,Quad PF,36+ Mos 08/18/2015  Pneumococcal Conjugate-13 07/01/2014  Pneumococcal Polysaccharide-23 09/16/2011  Tdap 09/16/2011  Zoster 09/06/2012  Significant History/Details   Smoking: Former Smoker (Quit Date:06/29/2000), 1.5 ppd, 60 pack-years  Smokeless Tobacco: Never Used  Alcohol: 0.0 oz alcohol/week  1 open order  Preferred Language: English  Specialty CommentsShow AllReport No comments regarding your specialty  Family Comments None                                  Oxygen Therapy No data recorded  My Last Outpatient Progress Note There are no Outpatient notes of this type for this patient

## 2015-10-01 NOTE — Telephone Encounter (Signed)
Left detailed message on personal voicemail there is no alternative for Diclofenac medication. Any questions please call office.

## 2015-10-01 NOTE — Telephone Encounter (Signed)
Unfortunately, no other alternatives

## 2015-10-01 NOTE — Telephone Encounter (Signed)
Pt call to say that her insurance does not cover the following med  Diclofenac Sodium 1 % CREAM  She is asking if something else can be called in

## 2015-10-01 NOTE — Telephone Encounter (Signed)
Please see message and advise 

## 2015-10-02 ENCOUNTER — Ambulatory Visit: Payer: Medicare Other | Admitting: Internal Medicine

## 2015-10-16 ENCOUNTER — Encounter: Payer: Self-pay | Admitting: Vascular Surgery

## 2015-10-17 ENCOUNTER — Other Ambulatory Visit: Payer: Self-pay | Admitting: Internal Medicine

## 2015-10-20 ENCOUNTER — Encounter: Payer: Self-pay | Admitting: Vascular Surgery

## 2015-10-20 ENCOUNTER — Other Ambulatory Visit: Payer: Self-pay | Admitting: *Deleted

## 2015-10-20 DIAGNOSIS — Z48812 Encounter for surgical aftercare following surgery on the circulatory system: Secondary | ICD-10-CM

## 2015-10-20 DIAGNOSIS — I6523 Occlusion and stenosis of bilateral carotid arteries: Secondary | ICD-10-CM

## 2015-10-20 DIAGNOSIS — I739 Peripheral vascular disease, unspecified: Secondary | ICD-10-CM

## 2015-10-21 ENCOUNTER — Ambulatory Visit: Payer: Medicare Other | Admitting: Vascular Surgery

## 2015-10-21 ENCOUNTER — Encounter (HOSPITAL_COMMUNITY): Payer: Medicare Other

## 2015-10-21 ENCOUNTER — Other Ambulatory Visit: Payer: Self-pay | Admitting: *Deleted

## 2015-10-21 ENCOUNTER — Ambulatory Visit (INDEPENDENT_AMBULATORY_CARE_PROVIDER_SITE_OTHER): Payer: Medicare Other | Admitting: Vascular Surgery

## 2015-10-21 ENCOUNTER — Ambulatory Visit (INDEPENDENT_AMBULATORY_CARE_PROVIDER_SITE_OTHER)
Admission: RE | Admit: 2015-10-21 | Discharge: 2015-10-21 | Disposition: A | Discharge status: Home / Self Care | Payer: Medicare Other | Source: Ambulatory Visit | Attending: Vascular Surgery | Admitting: Vascular Surgery

## 2015-10-21 ENCOUNTER — Encounter: Payer: Self-pay | Admitting: Vascular Surgery

## 2015-10-21 ENCOUNTER — Ambulatory Visit (HOSPITAL_COMMUNITY)
Admission: RE | Admit: 2015-10-21 | Discharge: 2015-10-21 | Disposition: A | Discharge status: Home / Self Care | Payer: Medicare Other | Source: Ambulatory Visit | Attending: Vascular Surgery | Admitting: Vascular Surgery

## 2015-10-21 VITALS — BP 168/59 | HR 49 | Ht 62.0 in | Wt 141.2 lb

## 2015-10-21 DIAGNOSIS — I83891 Varicose veins of right lower extremities with other complications: Secondary | ICD-10-CM | POA: Diagnosis not present

## 2015-10-21 DIAGNOSIS — I83899 Varicose veins of unspecified lower extremities with other complications: Secondary | ICD-10-CM | POA: Insufficient documentation

## 2015-10-21 DIAGNOSIS — I739 Peripheral vascular disease, unspecified: Secondary | ICD-10-CM

## 2015-10-21 DIAGNOSIS — Z48812 Encounter for surgical aftercare following surgery on the circulatory system: Secondary | ICD-10-CM

## 2015-10-21 DIAGNOSIS — I83811 Varicose veins of right lower extremities with pain: Secondary | ICD-10-CM

## 2015-10-21 DIAGNOSIS — I6523 Occlusion and stenosis of bilateral carotid arteries: Secondary | ICD-10-CM | POA: Diagnosis present

## 2015-10-21 NOTE — Progress Notes (Signed)
Subjective:     Patient ID: [name], female   DOB: 1941-04-24, 74 y.o.   MRN: [id number]  HPI This 74 year old female is seen in follow-up regarding her lower extremity occlusive disease and carotid occlusive disease. She has not been seen in 1 year. Her biggest concern however is 3 bleeding episodes which occurred spontaneously from a scab over varicose vein in the right ankle. Most recently was 2 weeks ago. She thought that she may have to go to the emergency department but was able to eventually get the bleeding stopped. She has no history of DVT thrombophlebitis stasis ulcers. She has a remote history of right femoral-popliteal graft by Dr. Amedeo Plenty which has functioned well for many years. She also has stable bilateral carotid occlusive disease 50% bilaterally. She denies lateralizing weakness, aphasia, amaurosis fugax, diplopia, blurred vision, or syncope. She is able to walk at least 1 block without claudication and occasionally will need nasal oxygen which she has used chronically.  Past Medical History  Diagnosis Date  . ANEMIA 08/28/2010  . CAD (coronary artery disease)     a. s/p CABGx4 in 2001.  . Adenomatous colon polyp 2000  . COPD (chronic obstructive pulmonary disease) ([place])   . Depressive disorder   . GERD 12/19/2009  . Hyperlipidemia   . Hypertension   . HYPOTENSION 08/11/2010  . Osteoarthritis     Lumbar stenosis  . PVD (peripheral vascular disease) ([place])     a. Duplex 03/2013: stable moderate carotid dz - 123456 RICA, [id number] LICA, L subclavian artery to CCA stent widely patent.  b. Prior R fempop bypass graft, R CIA stent.  . Lung abscess ([place]) 2011  . CHF (congestive heart failure) ([place])   . Diverticulosis   . Hiatal hernia     a. Dx 01/2014, placed on Xarelto.  . Aortic stenosis     a. Mild by echo 01/2013.  . Diabetes mellitus without complication ([place])   . Ringing in ears   . Anxiety   . Joint pain     Social History  Substance Use Topics  . Smoking status:  Former Smoker -- 1.50 packs/day for 40 years    Types: Cigarettes    Quit date: 06/29/2000  . Smokeless tobacco: Never Used  . Alcohol Use: No    Family History  Problem Relation Age of Onset  . Diabetes Father   . Heart attack Mother   . Diabetes    . Coronary artery disease    . Colon cancer Neg Hx     Allergies  Allergen Reactions  . Codeine Phosphate     unknown     Current outpatient prescriptions:  .  albuterol (PROVENTIL HFA;VENTOLIN HFA) 108 (90 BASE) MCG/ACT inhaler, Inhale 2 puffs into the lungs every 4 (four) hours as needed for wheezing or shortness of breath., Disp: , Rfl:  .  amiodarone (PACERONE) 200 MG tablet, Take 1 tablet by mouth daily except on Sat and Sun take 1/2 tablet by mouth, Disp: 30 tablet, Rfl: 3 .  apixaban (ELIQUIS) 2.5 MG TABS tablet, Take 2.5 mg by mouth 2 (two) times daily., Disp: , Rfl:  .  aspirin EC 81 MG EC tablet, Take 1 tablet (81 mg total) by mouth daily., Disp: 30 tablet, Rfl: 0 .  atorvastatin (LIPITOR) 40 MG tablet, TAKE 1 TABLET (40 MG TOTAL) BY MOUTH EVERY EVENING., Disp: 90 tablet, Rfl: 1 .  Diclofenac Sodium 1 % CREA, Place 1 application onto the skin 2 (two) times daily.,  Disp: 120 g, Rfl: 4 .  diltiazem (CARDIZEM CD) 120 MG 24 hr capsule, TAKE 1 CAPSULE (120 MG TOTAL) BY MOUTH DAILY., Disp: 30 capsule, Rfl: 5 .  furosemide (LASIX) 20 MG tablet, Take 1 tablet (20 mg total) by mouth daily., Disp: 90 tablet, Rfl: 3 .  gabapentin (NEURONTIN) 600 MG tablet, TAKE 1 TABLET (600 MG TOTAL) BY MOUTH 3 (THREE) TIMES DAILY., Disp: 270 tablet, Rfl: 3 .  glucose blood (ACCU-CHEK AVIVA PLUS) test strip, Test daily., Disp: 100 each, Rfl: 5 .  HYDROcodone-acetaminophen (NORCO/VICODIN) 5-325 MG per tablet, Take 0.5 tablets by mouth every 6 (six) hours as needed for moderate pain., Disp: 90 tablet, Rfl: 0 .  KLOR-CON 10 10 MEQ tablet, TAKE 1 TABLET (10 MEQ TOTAL) BY MOUTH DAILY., Disp: 30 tablet, Rfl: 6 .  LORazepam (ATIVAN) 0.5 MG tablet, TAKE ONE  TABLET BY MOUTH TWICE A DAY, Disp: 60 tablet, Rfl: 2 .  metFORMIN (GLUCOPHAGE) 500 MG tablet, Take 1 tablet (500 mg total) by mouth 2 (two) times daily with a meal., Disp: 180 tablet, Rfl: 3 .  metoprolol tartrate (LOPRESSOR) 25 MG tablet, Take 1 tablet (25 mg total) by mouth 2 (two) times daily., Disp: 180 tablet, Rfl: 3 .  Multiple Vitamins-Minerals (CENTRUM SILVER ULTRA WOMENS PO), Take 1 capsule by mouth daily. , Disp: , Rfl:  .  nitroGLYCERIN (NITROSTAT) 0.4 MG SL tablet, Place 0.4 mg under the tongue every 5 (five) minutes as needed for chest pain., Disp: , Rfl:  .  promethazine (PHENERGAN) 25 MG tablet, TAKE 1 TABLET BY MOUTH EVERY 6 HOURS AS NEEDED FOR NAUSEA OR VOMITING, Disp: 20 tablet, Rfl: 1 .  SPIRIVA HANDIHALER 18 MCG inhalation capsule, PLACE 1 CAPSULE (18 MCG TOTAL) INTO INHALER AND INHALE DAILY., Disp: 30 capsule, Rfl: 5 .  triamcinolone cream (KENALOG) 0.1 %, , Disp: , Rfl: 0 .  sertraline (ZOLOFT) 50 MG tablet, Take 25 mg by mouth daily., Disp: , Rfl:   Filed Vitals:   10/21/15 1042 10/21/15 1043  BP: 177/67 168/59  Pulse: 49   Height: 5\' 2"  (1.575 m)   Weight: 141 lb 3.2 oz (64.048 kg)   SpO2: 98%     Body mass index is 25.82 kg/(m^2).           Review of Systems Denies ongoing chest pain but does have mild dyspnea on exertion due to her COPD. Has history of coronary artery disease but no recent cardiac symptoms. See history of present illness other systems negative complete review of systems     Objective:   Physical Exam BP 168/59 mmHg  Pulse 49  Ht 5\' 2"  (1.575 m)  Wt 141 lb 3.2 oz (64.048 kg)  BMI 25.82 kg/m2  SpO2 98%  Gen.-alert and oriented x3 in no apparent distress HEENT normal for age Lungs no rhonchi or wheezing Cardiovascular regular rhythm no murmurs carotid pulses 3+ palpable  Bilateral carotid bruits  Abdomen soft nontender no palpable masses Musculoskeletal free of  major deformities Skin clear -no rashes Neurologic normal Lower  extremities 3+ femoral and  2+ popliteal pulse palpable bilaterally. Both feet adequately perfused Right leg with extensive large bulging varicosities over the great saphenous system beginning at the distal thigh extending to the medial malleolus. 2 scabbed areas are located over prominent varicosities where the bleeding recently occurred. Small eschar over Achilles tendon which is not infected measuring 1 cm in diameter. Mild early hyperpigmentation lower third right leg.    today I ordered a venous  duplex exam which I reviewed and interpreted. There is gross reflux throughout large great saphenous vein which is supplying these painful varicosities.  Also ordered carotid duplex exam which revealed no progression of disease since 1 year ago with mild to moderate stenosis of both internal carotid arteries at 50-60%  also ordered lower extremity ABIs which are stable at 1.01 in the right dorsalis pedis and 0.92 in the left posterior tibial calcified vessels       Assessment:      stable carotid occlusive disease and lower extremity occlusive disease  gross reflux right great saphenous vein supplying large extensive varicosities from the knee distally with 3 recent bleeding episodes from varix and right ankle area     Plan:      patient needs urgent laser ablation right great saphenous vein to prevent further bleeding episodes. She is  Quite concerned about spontaneous bleeding at nig since trauma did not cause the bleeding previously.   We will proceed with precertification 2 perform laser ablation right great saphenous vein in the near future. She will return in 1 year for follow-up carotid duplex exam and lower extremity arterial studies.

## 2015-10-22 NOTE — Addendum Note (Signed)
Addended by: Dorthula Rue L on: 10/22/2015 02:04 PM   Modules accepted: Orders

## 2015-10-25 ENCOUNTER — Other Ambulatory Visit: Payer: Self-pay | Admitting: Internal Medicine

## 2015-10-28 ENCOUNTER — Ambulatory Visit: Payer: Medicare Other | Admitting: Family

## 2015-10-28 ENCOUNTER — Other Ambulatory Visit: Payer: Self-pay | Admitting: *Deleted

## 2015-10-28 DIAGNOSIS — I83811 Varicose veins of right lower extremities with pain: Secondary | ICD-10-CM

## 2015-10-29 ENCOUNTER — Encounter: Payer: Self-pay | Admitting: Neurology

## 2015-10-29 ENCOUNTER — Ambulatory Visit (INDEPENDENT_AMBULATORY_CARE_PROVIDER_SITE_OTHER): Payer: Medicare Other | Admitting: Neurology

## 2015-10-29 VITALS — BP 114/55 | HR 49 | Ht 62.0 in | Wt 145.2 lb

## 2015-10-29 DIAGNOSIS — I480 Paroxysmal atrial fibrillation: Secondary | ICD-10-CM | POA: Diagnosis not present

## 2015-10-29 DIAGNOSIS — I8390 Asymptomatic varicose veins of unspecified lower extremity: Secondary | ICD-10-CM | POA: Insufficient documentation

## 2015-10-29 DIAGNOSIS — I63411 Cerebral infarction due to embolism of right middle cerebral artery: Secondary | ICD-10-CM

## 2015-10-29 DIAGNOSIS — I10 Essential (primary) hypertension: Secondary | ICD-10-CM

## 2015-10-29 DIAGNOSIS — I779 Disorder of arteries and arterioles, unspecified: Secondary | ICD-10-CM | POA: Diagnosis not present

## 2015-10-29 DIAGNOSIS — I8393 Asymptomatic varicose veins of bilateral lower extremities: Secondary | ICD-10-CM | POA: Diagnosis not present

## 2015-10-29 DIAGNOSIS — E785 Hyperlipidemia, unspecified: Secondary | ICD-10-CM

## 2015-10-29 DIAGNOSIS — I739 Peripheral vascular disease, unspecified: Secondary | ICD-10-CM

## 2015-10-29 NOTE — Progress Notes (Signed)
STROKE NEUROLOGY FOLLOW UP NOTE  NAME: [name] DOB: [id number]  REASON FOR VISIT: stroke follow up HISTORY FROM: pt and charts  Today we had the pleasure of seeing [name] in follow-up at our Neurology Clinic. Pt was accompanied by daughter.   History Summary [name] is a 74 year old right-handed woman with PMH of CAD s/p stent, CHF, PVD, HTN, HLD, DM, COPD who was admitted on 04/09/14 for an episode of syncope, headache, slurry speech and fall. CT Head was unremarkable. MRI Brain revealed acute punctate right parietal lobe infarct. She was found to have an episode of A. fib/flutter with RVR and asssociated hypotension. Cardiology started amiodarone, while Cardizem was discontinued and metoprolol was decreased. She had already previously had a carotid doppler performed on 04/04/14, which revealed 40-59% RICA stenosis and [id number] LICA with patent vertebral arteries. Vascular surgery ordered CTA of the neck, showing at least 90% high-grade complex plaque involving the proximal innominate artery with extensive calcification and irregularity in the right ICA, and 75-80% calcific stenosis of the left ICA. Vascular surgery recommended no intervention for the right ICA. Follow-up was recommended in 6 months to re-evaluate the left ICA. She was continued on Xarelto and aspirin was discontinued. For the new-onset diabetes, she was started on Amaryl.   Follow up 08/01/14 (LL) - She is tolerating Xarelto well with no signs of significant bleeding or bruising. Daughter states her mother has been weaker than normal this week. She fell on Sunday in her kitchen; she states she is not sure what happened but her right leg tends to be weaker than the left; she was not hurt and did not hit her head. She is having trouble with her balance at times. She was found to have dark stool and anemia with plan to see GI the second day.  11/01/14 follow up - the patient has been doing gradually better. She was  admitted the same day after last visit due to acute GI bleeding and severe anemia with Hb down to 7.2. Colonoscopy was done found to have diverticulosis and one single polyp which was resected. Other GI work up did not find other source of bleeding and followed up with GI stated "No absolute contraindication to restarting anticoagulants however recurrent GI bleeding is a potential risk". Her Hb is trending up last check was 11.2 yesterday.  She also followed up with cardiology for afib and CHF, currently on lasix, amiodarone, cardiazem, still holding for anticoagulation due to GIB. She also followed up with vascular surgery and repeat CUS on 10/15/14 and it showed an approximate 60-70% bilateral ICA stenosis left worse than right and her left carotid subclavian bypass is patent, which was stable compared with previous studies. She was asked to follow up in one year. She also followed up with pulmonology and doing well so her O2 was taken off during the day and only used at night. She also followed up with her PCP and her A1C was 5.9 on 09/10/14.  Otherwise, she is feeling better gradually, more energy than before. In clinic today, she is conversing well and BP 129/44. She is still on ASA. No more melena.  Follow up 04/29/15 - she has been doing well. No recurrent symptoms. Dr. Meda Coffee has started her on eliquis 5mg  bid for stroke prevention due to afib. She still on baby ASA 81mg  for cardiac prevention. No GIB. Quit smoking 15 years ago. Has been back to work 2 hours a day in [employer]  Has appointment with Dr. Meda Coffee next month. BP and glucose at home controlled well. BP today 122/52.  Interval History During the interval time, she has been doing well. No recurrent stroke symptoms. Followed up with Dr. Kellie Simmering and repeat CUS showed b/l 50-60% ICA stenosis, stable from previous. She is still on eliquis and ASA 81. Had 3 episode of right leg bleeding due to varicosities. Had scheduled on 11/17/15 for  laser ablation with Dr. Kellie Simmering. Glucose monitoring at home around 100, and today BP 114/55 in clinic. No other complains. Pt still working as a Scientist, water quality.   REVIEW OF SYSTEMS: Full 14 system review of systems performed and notable only for those listed below and in HPI above, all others are negative:  Constitutional: N/A  Cardiovascular: N/A  Ear/Nose/Throat:   Skin: Eyes: N/A  Respiratory:   Gastroitestinal: N/A  Genitourinary: N/A Hematology/Lymphatic: N/A  Endocrine:   Musculoskeletal:   Allergy/Immunology: N/A  Neurological: N/A  Psychiatric:   The following represents the patient's updated allergies and side effects list: Allergies  Allergen Reactions  . Codeine Phosphate     unknown    The neurologically relevant items on the patient's problem list were reviewed on today's visit.  Neurologic Examination  A problem focused neurological exam (12 or more points of the single system neurologic examination, vital signs counts as 1 point, cranial nerves count for 8 points) was performed.  Blood pressure 114/55, pulse 49, height 5\' 2"  (1.575 m), weight 145 lb 3.2 oz (65.862 kg).  General - Well nourished, well developed, in no apparent distress.  Ophthalmologic - not able to see through.  Cardiovascular - Regular rate and rhythm with no murmur.  Mental Status -  Level of arousal and orientation to time, place, and person were intact. Language including expression, naming, repetition, comprehension was assessed and found intact.  Cranial Nerves II - XII - II - Visual field intact OU. III, IV, VI - Extraocular movements intact. V - Facial sensation intact bilaterally. VII - Facial movement intact bilaterally. VIII - Hearing & vestibular intact bilaterally. X - Palate elevates symmetrically. XI - Chin turning & shoulder shrug intact bilaterally. XII - Tongue protrusion intact.  Motor Strength - The patient's strength was normal in all extremities and pronator drift was  absent.  Bulk was normal and fasciculations were absent.   Motor Tone - Muscle tone was assessed at the neck and appendages and was normal.  Reflexes - The patient's reflexes were normal in all extremities and she had no pathological reflexes.  Sensory - Light touch, temperature/pinprick were assessed and were normal.    Coordination - The patient had normal movements in the hands and feet with no ataxia or dysmetria.  Tremor was absent.  Gait and Station - mildly slow gait otherwise normal.  Data reviewed: I personally reviewed the images and agree with the radiology interpretations.  CTA neck 04/12/14 High-grade complex plaque involving the proximal innominate artery with extensive calcification in irregularity, estimated at least 90% stenosis. 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 Estimated 75-80% calcific stenosis left internal carotid artery origin. Non stenotic atheromatous change right carotid bifurcation. Right vertebral dominant without significant proximal flow reducing lesion. Left vertebral patent.  MRI 04/10/14 Acute nonhemorrhagic infarct of the right parietal lobe measures 10 mm.  CTA head 05/29/14 Extensive atherosclerotic calcification affecting the major vessels at the base of the brain. No stenosis in the carotid siphon regions greater than 30%. No focal intracranial stenosis beyond that.  50% stenosis of the right vertebral artery at the foramen magnum.  Three punctate calcifications along the surface of the brain in the parietal regions. These could represent benign leptomeningeal calcifications or could represent embolized calcium related to a previous vascular event. They were present on the study of 04/09/2014.  CUS 10/15/14 - bilateral 40-59% ICA stenosis -  left carotid subclavian bypass is patent  2D echo 04/11/14 - Left ventricle: The cavity size was normal. Wall thickness was normal. Systolic function  was normal. The estimated ejection fraction was in the range of 50% to 55%. Moderate hypokinesis of the mid-distalanteroseptal myocardium. Features are consistent with a pseudonormal left ventricular filling pattern, with concomitant abnormal relaxation and increased filling pressure (grade 2 diastolic dysfunction). - Aortic valve: Mildly to moderately calcified annulus. Mildly calcified leaflets. Valve area: 0.96cm^2(VTI). Valve area: 1cm^2 (Vmax). - Mitral valve: Mild regurgitation. - Left atrium: The atrium was severely dilated. - Right ventricle: The cavity size was mildly dilated. Systolic function was mildly reduced. - Pulmonary arteries: Systolic pressure was mildly increased. PA peak pressure: 50mm Hg (S).  CUS 10/21/15 - no progression of disease since 1 year ago with mild to moderate stenosis of both internal carotid arteries at 50-60%  Hgb A1c was 6.9. LDL was 45.  Assessment: As you may recall, she is a 74 y.o. Caucasian female with PMH of CAD s/p stent, CHF, PVD, HTN, HLD, DM, COPD was admitted to [facility] on 04/09/14 for syncope episode. MRI showed punctate right parietal subcortical infarct. Also found to have pAfib/flutter, so she was put on amiodarone and metoprolol and Xarelto. However, after discharge in 07/2014, she developed GIB and Xarelto stopped. GI work up unrevealing except diverticulosis and one single polyp which was resected. GI stated that "No absolute contraindication to restarting anticoagulants however recurrent GI bleeding is a potential risk". Her anemia much better and Hb was 11.2 in 10/2014. Followed up with Dr. Meda Coffee in cardiology and started with eliquis 5mg  bid and also continued with ASA 81. Pt tolerate well and no bleeding side effect so far. Her CUS 09/2014 showed bilateral 40-59% ICA stenosis, continue medical management and follow up with Dr. Kellie Simmering in 09/2015 repeat CUS showed 50-60% ICA stenosis bilaterally. Pt does have 3 bleeding  episodes from right leg varicosities and have scheduled on 11/17/15 for laser ablation.   Plan:  - continue eliquis, ASA and lipitor for stroke and cardiac prevention - follow up with cardiology and vascular surgery - bleeding precautions - Follow up with your primary care physician for stroke risk factor modification. Recommend maintain blood pressure goal <130/80, diabetes with hemoglobin A1c goal below 6.5% and lipids with LDL cholesterol goal below 70 mg/dL.  - check BP and glucose at home - follow up as needed.  No orders of the defined types were placed in this encounter.    No orders of the defined types were placed in this encounter.    Patient Instructions  - continue eliquis, ASA and lipitor for stroke and cardiac prevention - follow up with cardiology and vascular surgery - bleeding precautions - Follow up with your primary care physician for stroke risk factor modification. Recommend maintain blood pressure goal <130/80, diabetes with hemoglobin A1c goal below 6.5% and lipids with LDL cholesterol goal below 70 mg/dL.  - check BP and glucose at home - follow up as needed.    Rosalin Hawking, MD PhD [facility] [address], [place], [place] [address] [phone]

## 2015-10-29 NOTE — Patient Instructions (Signed)
-   continue eliquis, ASA and lipitor for stroke and cardiac prevention - follow up with cardiology and vascular surgery - bleeding precautions - Follow up with your primary care physician for stroke risk factor modification. Recommend maintain blood pressure goal <130/80, diabetes with hemoglobin A1c goal below 6.5% and lipids with LDL cholesterol goal below 70 mg/dL.  - check BP and glucose at home - follow up as needed.

## 2015-10-31 ENCOUNTER — Telehealth: Payer: Self-pay | Admitting: Internal Medicine

## 2015-10-31 NOTE — Telephone Encounter (Signed)
Please see message and advise 

## 2015-10-31 NOTE — Telephone Encounter (Signed)
This is a generic medication and there are no cheaper substitutes.

## 2015-10-31 NOTE — Telephone Encounter (Signed)
Left detailed message on personal voicemail that there is no cheaper substitute for Gabapentin this is already a generic. You can go online if you have a computer and look under Good Rx for cheaper pharmacy for the Rx and coupons for discount. Any questions please call office.

## 2015-10-31 NOTE — Telephone Encounter (Signed)
Pt said she is in the dougnut hole and can not pay $111.00 and is asking if there is something else can be called in gabapentin (NEURONTIN) 600 MG tablet   Pharmacy [facility]

## 2015-11-11 ENCOUNTER — Other Ambulatory Visit: Payer: Self-pay | Admitting: Internal Medicine

## 2015-11-12 ENCOUNTER — Encounter: Payer: Self-pay | Admitting: Vascular Surgery

## 2015-11-17 ENCOUNTER — Ambulatory Visit (INDEPENDENT_AMBULATORY_CARE_PROVIDER_SITE_OTHER): Payer: Medicare Other | Admitting: Cardiology

## 2015-11-17 ENCOUNTER — Ambulatory Visit (INDEPENDENT_AMBULATORY_CARE_PROVIDER_SITE_OTHER): Payer: Medicare Other | Admitting: Vascular Surgery

## 2015-11-17 ENCOUNTER — Other Ambulatory Visit: Payer: Medicare Other

## 2015-11-17 ENCOUNTER — Encounter: Payer: Self-pay | Admitting: Vascular Surgery

## 2015-11-17 ENCOUNTER — Encounter: Payer: Self-pay | Admitting: Cardiology

## 2015-11-17 VITALS — BP 136/72 | HR 55 | Ht 62.0 in | Wt 145.0 lb

## 2015-11-17 VITALS — BP 159/55 | HR 58 | Temp 98.0°F | Resp 16 | Ht 62.5 in | Wt 145.0 lb

## 2015-11-17 DIAGNOSIS — I83891 Varicose veins of right lower extremities with other complications: Secondary | ICD-10-CM | POA: Diagnosis not present

## 2015-11-17 DIAGNOSIS — I350 Nonrheumatic aortic (valve) stenosis: Secondary | ICD-10-CM

## 2015-11-17 DIAGNOSIS — I4892 Unspecified atrial flutter: Secondary | ICD-10-CM

## 2015-11-17 DIAGNOSIS — I5033 Acute on chronic diastolic (congestive) heart failure: Secondary | ICD-10-CM

## 2015-11-17 DIAGNOSIS — I248 Other forms of acute ischemic heart disease: Secondary | ICD-10-CM | POA: Diagnosis not present

## 2015-11-17 DIAGNOSIS — I2583 Coronary atherosclerosis due to lipid rich plaque: Secondary | ICD-10-CM

## 2015-11-17 DIAGNOSIS — I2510 Atherosclerotic heart disease of native coronary artery without angina pectoris: Secondary | ICD-10-CM

## 2015-11-17 DIAGNOSIS — I480 Paroxysmal atrial fibrillation: Secondary | ICD-10-CM

## 2015-11-17 LAB — COMPREHENSIVE METABOLIC PANEL
ALT: 24 U/L (ref 6–29)
AST: 29 U/L (ref 10–35)
Albumin: 3.9 g/dL (ref 3.6–5.1)
Alkaline Phosphatase: 86 U/L (ref 33–130)
BUN: 31 mg/dL — ABNORMAL HIGH (ref 7–25)
CO2: 27 mmol/L (ref 20–31)
Calcium: 9.3 mg/dL (ref 8.6–10.4)
Chloride: 104 mmol/L (ref 98–110)
Creat: 1.48 mg/dL — ABNORMAL HIGH (ref 0.60–0.93)
Glucose, Bld: 91 mg/dL (ref 65–99)
Potassium: 4.7 mmol/L (ref 3.5–5.3)
Sodium: 141 mmol/L (ref 135–146)
Total Bilirubin: 0.4 mg/dL (ref 0.2–1.2)
Total Protein: 6.9 g/dL (ref 6.1–8.1)

## 2015-11-17 LAB — CBC WITH DIFFERENTIAL/PLATELET
Basophils Absolute: 0 10*3/uL (ref 0.0–0.1)
Basophils Relative: 0 % (ref 0–1)
Eosinophils Absolute: 0.2 10*3/uL (ref 0.0–0.7)
Eosinophils Relative: 2 % (ref 0–5)
HCT: 27.6 % — ABNORMAL LOW (ref 36.0–46.0)
Hemoglobin: 9.2 g/dL — ABNORMAL LOW (ref 12.0–15.0)
Lymphocytes Relative: 19 % (ref 12–46)
Lymphs Abs: 1.7 10*3/uL (ref 0.7–4.0)
MCH: 27.3 pg (ref 26.0–34.0)
MCHC: 33.3 g/dL (ref 30.0–36.0)
MCV: 81.9 fL (ref 78.0–100.0)
MPV: 11.2 fL (ref 8.6–12.4)
Monocytes Absolute: 0.6 10*3/uL (ref 0.1–1.0)
Monocytes Relative: 7 % (ref 3–12)
Neutro Abs: 6.5 10*3/uL (ref 1.7–7.7)
Neutrophils Relative %: 72 % (ref 43–77)
Platelets: 226 10*3/uL (ref 150–400)
RBC: 3.37 MIL/uL — ABNORMAL LOW (ref 3.87–5.11)
RDW: 15.4 % (ref 11.5–15.5)
WBC: 9 10*3/uL (ref 4.0–10.5)

## 2015-11-17 LAB — TSH: TSH: 6.515 u[IU]/mL — ABNORMAL HIGH (ref 0.350–4.500)

## 2015-11-17 LAB — T4, FREE: Free T4: 1.2 ng/dL (ref 0.80–1.80)

## 2015-11-17 LAB — T3, FREE: T3, Free: 2.6 pg/mL (ref 2.3–4.2)

## 2015-11-17 MED ORDER — AMIODARONE HCL 200 MG PO TABS: 100.0000 mg | ORAL_TABLET | Freq: Every day | ORAL | Status: AC

## 2015-11-17 NOTE — Progress Notes (Signed)
Patient ID: YSA EVOY, female   DOB: 04-13-1941, 74 y.o.   MRN: [id number]    Chief complain: LE edema  HPI [name] returns today for ongoing evaluation of atrial fibrillation and atrial flutter. She has an extensive past medical history with severe vascular disease including CAD, s/p CABG, peripheral vascular disease with subclavian artery bypass, HTN, and dyslipidemia and diastolic CHF. She was recently hospitalized (admitted on 9/14) with acute on chronic CHF, demand ischemia in the setting of unexplained anemia. She was diuresed 15 lbs, dry weight < 148 lbs. In the interim she developed atrial flutter with an RVR which has been only modestly symptomatic. She was placed on systemic anti-coagulation and started on amiodarone, anticoagulation stopped because of anemia.  Dr. Lovena Le reviewed her cardiac monitor and she had both atrial fib and flutter and that flutter ablation alone would be unlikely to control her symptoms. She is now following her weight very carefully. She brings her diary with stable weigh 140-142 lbs. She feels well. Her only concern is dysuria since yesterday. No fever. No orthopnea, no PND, no palpitations or syncope. No CP.   11/01/2014 - She is coming after 2 months, no dizziness or palpitations, no falls. Her most recent Hb was 11.2 on 12.2. No signs of bleeding. No CP, stable SOB. She has stopped using home O2 during the day and is only using it at night. Sh eis complaint with all her meds. Continues to have dark urine, but no dysuria. Her weight is 138 lbs, in September 142 lbs.    02/18/2015 - the patient is coming after 3 months, at the last visit we checked her Crea and it was elevated 1.1 --> 1.4, her lasix was decreased to 20 mg po daily. She has been feeling well, walking 1-1.5 mile daily with her sister, she uses O2 at night and denies DOE, orthopnea, PND. Also denies LE edema, palpitations or syncope, no signs of bleeding, no melenas. She complains of dark and  fouls smelling urine.   11/17/2015 - 6 months follow up, she is doing well, went back to work 5 hours/day. Continues to walk with her sister, denies chest pain, has stable DOE, no palpitations, or syncope. She was seen by Dr. Lovena Le in September and her dose of amiodarone was further decrease her elevated TSH. She is being seen by Dr. Kellie Simmering for significant reflux of the saphenous vein with bleeding and is undergoing laser ablation later today. She is at her baseline weight of 145 pounds denies orthopnea or proximal nocturnal dyspnea.   Allergies  Allergen Reactions  . Codeine Phosphate     unknown     Current Outpatient Prescriptions  Medication Sig Dispense Refill  . albuterol (PROVENTIL HFA;VENTOLIN HFA) 108 (90 BASE) MCG/ACT inhaler Inhale 2 puffs into the lungs every 4 (four) hours as needed for wheezing or shortness of breath.    Marland Kitchen amiodarone (PACERONE) 200 MG tablet Take 1 tablet by mouth daily except on Sat and Sun take 1/2 tablet by mouth 30 tablet 3  . apixaban (ELIQUIS) 2.5 MG TABS tablet Take 2.5 mg by mouth 2 (two) times daily.    Marland Kitchen aspirin EC 81 MG EC tablet Take 1 tablet (81 mg total) by mouth daily. 30 tablet 0  . atorvastatin (LIPITOR) 40 MG tablet TAKE 1 TABLET (40 MG TOTAL) BY MOUTH EVERY EVENING. 90 tablet 1  . Diclofenac Sodium 1 % CREA Place 1 application onto the skin 2 (two) times daily. 120 g 4  .  diltiazem (CARDIZEM CD) 120 MG 24 hr capsule TAKE 1 CAPSULE (120 MG TOTAL) BY MOUTH DAILY. 30 capsule 5  . furosemide (LASIX) 20 MG tablet Take 1 tablet (20 mg total) by mouth daily. 90 tablet 3  . gabapentin (NEURONTIN) 600 MG tablet TAKE 1 TABLET (600 MG TOTAL) BY MOUTH 3 (THREE) TIMES DAILY. 270 tablet 1  . glucose blood (ACCU-CHEK AVIVA PLUS) test strip Test daily. 100 each 5  . HYDROcodone-acetaminophen (NORCO/VICODIN) 5-325 MG per tablet Take 0.5 tablets by mouth every 6 (six) hours as needed for moderate pain. 90 tablet 0  . KLOR-CON 10 10 MEQ tablet TAKE 1 TABLET  (10 MEQ TOTAL) BY MOUTH DAILY. 30 tablet 6  . LORazepam (ATIVAN) 0.5 MG tablet TAKE ONE TABLET BY MOUTH TWICE A DAY 60 tablet 2  . metFORMIN (GLUCOPHAGE) 500 MG tablet TAKE 1 TABLET (500 MG TOTAL) BY MOUTH 2 (TWO) TIMES DAILY WITH A MEAL. 180 tablet 1  . metoprolol tartrate (LOPRESSOR) 25 MG tablet Take 1 tablet (25 mg total) by mouth 2 (two) times daily. 180 tablet 3  . Multiple Vitamins-Minerals (CENTRUM SILVER ULTRA WOMENS PO) Take 1 capsule by mouth daily.     . nitroGLYCERIN (NITROSTAT) 0.4 MG SL tablet Place 0.4 mg under the tongue every 5 (five) minutes as needed for chest pain.    . promethazine (PHENERGAN) 25 MG tablet TAKE 1 TABLET BY MOUTH EVERY 6 HOURS AS NEEDED FOR NAUSEA OR VOMITING 20 tablet 1  . sertraline (ZOLOFT) 50 MG tablet Take 25 mg by mouth daily.    Marland Kitchen SPIRIVA HANDIHALER 18 MCG inhalation capsule PLACE 1 CAPSULE (18 MCG TOTAL) INTO INHALER AND INHALE DAILY. 30 capsule 5  . triamcinolone cream (KENALOG) 0.1 % Apply 1 application topically as directed.   0   No current facility-administered medications for this visit.     Past Medical History  Diagnosis Date  . ANEMIA 08/28/2010  . CAD (coronary artery disease)     a. s/p CABGx4 in 2001.  . Adenomatous colon polyp 2000  . COPD (chronic obstructive pulmonary disease) ([place])   . Depressive disorder   . GERD 12/19/2009  . Hyperlipidemia   . Hypertension   . HYPOTENSION 08/11/2010  . Osteoarthritis     Lumbar stenosis  . PVD (peripheral vascular disease) ([place])     a. Duplex 03/2013: stable moderate carotid dz - 123456 RICA, [id number] LICA, L subclavian artery to CCA stent widely patent.  b. Prior R fempop bypass graft, R CIA stent.  . Lung abscess ([place]) 2011  . CHF (congestive heart failure) ([place])   . Diverticulosis   . Hiatal hernia     a. Dx 01/2014, placed on Xarelto.  . Aortic stenosis     a. Mild by echo 01/2013.  . Diabetes mellitus without complication ([place])   . Ringing in ears   . Anxiety   . Joint pain   .  Stroke ([place])     ROS:   All systems reviewed and negative except as noted in the HPI.   Past Surgical History  Procedure Laterality Date  . L subclavian bypass  07/2004  . Tubal ligation    . Coronary artery bypass graft  05/2000    LIMA-LAD, SVG-OM, SVG-PDA-PL  . Femoral popliteal bypass-r  04/1999  . Right common iliac pta with stent placement  07/2000  . Right leg blockage  2003  . Aorta bifemoral bypass graft  2004  . Left cea  2005  . Lung cyst  biopsy  2011  . Esophagogastroduodenoscopy N/A 08/03/2014    Procedure: ESOPHAGOGASTRODUODENOSCOPY (EGD);  Surgeon: Juanita Craver, MD;  Location: [facility] ENDOSCOPY;  Service: Endoscopy;  Laterality: N/A;  . Colonoscopy Left 08/05/2014    Procedure: COLONOSCOPY;  Surgeon: Juanita Craver, MD;  Location: [place];  Service: Endoscopy;  Laterality: Left;  Freda Munro capsule study N/A 08/06/2014    Procedure: GIVENS CAPSULE STUDY;  Surgeon: Irene Shipper, MD;  Location: [place];  Service: Endoscopy;  Laterality: N/A;  . Left heart catheterization with coronary/graft angiogram  03/11/2014    Procedure: LEFT HEART CATHETERIZATION WITH Beatrix Fetters;  Surgeon: Peter M Martinique, MD;  Location: [facility] CATH LAB;  Service: Cardiovascular;;    Family History  Problem Relation Age of Onset  . Diabetes Father   . Heart attack Mother   . Diabetes    . Coronary artery disease    . Colon cancer Neg Hx    Social History   Social History  . Marital Status: Widowed    Spouse Name: N/A  . Number of Children: 2  . Years of Education: 12   Occupational History  . Receptionist    Social History Main Topics  . Smoking status: Former Smoker -- 1.50 packs/day for 40 years    Types: Cigarettes    Quit date: 06/29/2000  . Smokeless tobacco: Never Used  . Alcohol Use: No  . Drug Use: No  . Sexual Activity: Not on file   Other Topics Concern  . Not on file   Social History Narrative   Patient is widowed with 2 children.   Patient is right handed.    Patient has hs education.   Patient drinks 2 cups daily.     BP 136/72 mmHg  Pulse 55  Ht 5\' 2"  (1.575 m)  Wt 145 lb (65.772 kg)  BMI 26.51 kg/m2  SpO2 94%  Physical Exam: stable appearing 74 yo woman, NAD HEENT: Unremarkable Neck:  No JVD, no thyromegally Back:  No CVA tenderness Lungs:  Clear with scattered rales. Decreased breath sounds. HEART:  Regular rate rhythm, no murmurs, no rubs, no clicks Abd:  soft, positive bowel sounds, no organomegally, no rebound, no guarding Ext:  2 plus pulses, no edema, no cyanosis, no clubbing Skin:  No rashes no nodules Neuro:  CN II through XII intact, motor grossly intact  EKG - NSR at 65/min.  Echo 04/11/2014  Left ventricle: The cavity size was normal. Wall thickness was normal. Systolic function was normal. The estimated ejection fraction was in the range of 50% to 55%. Moderate hypokinesis of the mid-distalanteroseptal myocardium. Features are consistent with a pseudonormal left ventricular filling pattern, with concomitant abnormal relaxation and increased filling pressure (grade 2 diastolic dysfunction). - Aortic valve: Mildly to moderately calcified annulus. Mildly calcified leaflets. Valve area: 0.96cm^2(VTI). Valve area: 1cm^2 (Vmax). - Mitral valve: Mild regurgitation. - Left atrium: The atrium was severely dilated. - Right ventricle: The cavity size was mildly dilated. Systolic function was mildly reduced. - Pulmonary arteries: Systolic pressure was mildly increased. PA peak pressure: 56mm Hg (S).    Assess/Plan:  1. Acute on chronic diastolic heart failure (EF 50-55%)  - Lasix, 20 mg once a day, extra 20 mg if needed, new dry weight is 145 lbs - improved SOB, no need for day use of home O2 - Stable euvolemic, full the same regimen.  2. Demand ischemia while in a-flutter with RVR-  - mildly elevated troponin.  - No cardiac catheterization, no symptoms of  unstable angina.  - Low-dose aspirin. Continue with beta  blocker.   3. COPD  - Chronic, playing a role in her dyspnea.  - Continue with oxygen therapy only at night. Continue COPD regimen.   4. Paroxysmal atrial fibrillation  - Currently sinus rhythm on amiodarone, low-dose, decrease to 100 mg po daily, follow TSH today, in June TSH elevated but free T3 and free T4 were normal. We will gradually wean down her dose of amio. - Maintaining sinus rhythm. Cardizem CD 120 as well.  - Tinea anticoagulation with apixaban (Eliquis 5 mg po BID), she was given 2 months of free supply  5. Sinus bradycardia  - she is asymptomatic  6. Coronary artery disease  - Bypass 2001, stable. Elevated troponin secondary to demand ischemia.   7. Peripheral vascular disease  - Aortobifemoral in 2005, stable. Pletal -stopped given her history of diastolic heart failure and potential bleeding. Continue aspirin if possible. - Followed by Dr. Kellie Simmering for gross reflux right great saphenous vein supplying large extensive varicosities from the knee distally with 3 recent bleeding episodes from varix and right ankle area, she is undergoing laser ablation right great saphenous vein today.  8. Anemia-GI seeing. Stable Hb 11.2 on 12/2, restarted Eliquis  Follow up in 3 months. Check components of metabolic profile, CBC, TSH, free T3, free T4.  Dorothy Spark 11/17/2015

## 2015-11-17 NOTE — Patient Instructions (Signed)
Medication Instructions:   START TAKING AMIODARONE 100 MG ONCE DAILY   Labwork:  TODAY--CMET, CBC W DIFF, TSH, T 3 FREE, AND T 4 FREE    Follow-Up:  3 MONTHS WITH DR Meda Coffee     If you need a refill on your cardiac medications before your next appointment, please call your pharmacy.

## 2015-11-17 NOTE — Progress Notes (Signed)
Subjective:     Patient ID: [name], female   DOB: 03/14/1941, 74 y.o.   MRN: [id number]  HPI this 74 year old female had laser ablation of the right great saphenous vein performed under local tumescent anesthesia. Total of 1474 J of energy was utilized. She tolerated the procedure well.   Review of Systems     Objective:   Physical Exam BP 159/55 mmHg  Pulse 58  Temp(Src) 98 F (36.7 C)  Resp 16  Ht 5' 2.5" (1.588 m)  Wt 145 lb (65.772 kg)  BMI 26.08 kg/m2  SpO2 97%       Assessment:     Well tolerated laser ablation right great saphenous vein performed under local tumescent anesthesia    Plan:     Return in 1 week for venous duplex exam confirmed closure right great saphenous vein Patient will then return in 3 months to see if stab phlebectomy or sclerotherapy is indicated for painful varicosities

## 2015-11-17 NOTE — Progress Notes (Signed)
Laser Ablation Procedure    Date: 0000000   [name] [id number]  Consent signed: Yes    Surgeon:  Dr. Nelda Severe. Kellie Simmering  Procedure: Laser Ablation: right Greater Saphenous Vein  BP 159/55 mmHg  Pulse 58  Temp(Src) 98 F (36.7 C)  Resp 16  Ht 5' 2.5" (1.588 m)  Wt 145 lb (65.772 kg)  BMI 26.08 kg/m2  SpO2 97%  Tumescent Anesthesia: 300 cc 0.9% NaCl with 50 cc Lidocaine HCL with 1% Epi and 15 cc 8.4% NaHCO3  Local Anesthesia: 4 cc Lidocaine HCL and NaHCO3 (ratio 2:1)  Pulsed Mode: 15 watts, 566ms delay, 1.0 duration  Total Energy: 1437             Total Pulses:  97              Total Time: 1:36    Patient tolerated procedure well  Notes:   Description of Procedure:  After marking the course of the secondary varicosities, the patient was placed on the operating table in the supine position, and the right leg was prepped and draped in sterile fashion.   Local anesthetic was administered and under ultrasound guidance the saphenous vein was accessed with a micro needle and guide wire; then the mirco puncture sheath was placed.  A guide wire was inserted saphenofemoral junction , followed by a 5 french sheath.  The position of the sheath and then the laser fiber below the junction was confirmed using the ultrasound.  Tumescent anesthesia was administered along the course of the saphenous vein using ultrasound guidance. The patient was placed in Trendelenburg position and protective laser glasses were placed on patient and staff, and the laser was fired at 15 watts continuous mode advancing 1-53mm/second for a total of 1437 joules.     Steri strips were applied to the stab wounds and ABD pads and thigh high compression stockings were applied.  Ace wrap bandages were applied over the phlebectomy sites and at the top of the saphenofemoral junction. Blood loss was less than 15 cc.  The patient ambulated out of the operating room having tolerated the procedure well.

## 2015-11-18 ENCOUNTER — Telehealth: Payer: Self-pay | Admitting: *Deleted

## 2015-11-18 ENCOUNTER — Encounter: Payer: Self-pay | Admitting: Vascular Surgery

## 2015-11-18 ENCOUNTER — Other Ambulatory Visit: Payer: Medicare Other | Admitting: Vascular Surgery

## 2015-11-18 NOTE — Telephone Encounter (Signed)
Pt doing well. No discomfort. Following all instructions.

## 2015-11-19 ENCOUNTER — Encounter: Payer: Self-pay | Admitting: Vascular Surgery

## 2015-11-25 ENCOUNTER — Encounter: Payer: Self-pay | Admitting: Vascular Surgery

## 2015-11-25 ENCOUNTER — Ambulatory Visit (HOSPITAL_COMMUNITY)
Admission: RE | Admit: 2015-11-25 | Discharge: 2015-11-25 | Disposition: A | Discharge status: Home / Self Care | Payer: Medicare Other | Source: Ambulatory Visit | Attending: Vascular Surgery | Admitting: Vascular Surgery

## 2015-11-25 ENCOUNTER — Other Ambulatory Visit: Payer: Self-pay | Admitting: Cardiology

## 2015-11-25 ENCOUNTER — Ambulatory Visit (INDEPENDENT_AMBULATORY_CARE_PROVIDER_SITE_OTHER): Payer: Medicare Other | Admitting: Vascular Surgery

## 2015-11-25 VITALS — BP 174/63 | HR 56 | Ht 62.5 in | Wt 146.2 lb

## 2015-11-25 DIAGNOSIS — I83811 Varicose veins of right lower extremities with pain: Secondary | ICD-10-CM | POA: Insufficient documentation

## 2015-11-25 DIAGNOSIS — I83891 Varicose veins of right lower extremities with other complications: Secondary | ICD-10-CM | POA: Diagnosis not present

## 2015-11-25 DIAGNOSIS — I10 Essential (primary) hypertension: Secondary | ICD-10-CM | POA: Insufficient documentation

## 2015-11-25 DIAGNOSIS — E119 Type 2 diabetes mellitus without complications: Secondary | ICD-10-CM | POA: Insufficient documentation

## 2015-11-25 DIAGNOSIS — E785 Hyperlipidemia, unspecified: Secondary | ICD-10-CM | POA: Diagnosis not present

## 2015-11-25 NOTE — Progress Notes (Signed)
Subjective:     Patient ID: [name], female   DOB: 04/13/1941, 74 y.o.   MRN: [id number]  HPI This 74 year old female returns 1 week post-laser ablation right great saphenous vein for recent episodes of bleeding from varix in the right ankle area. She has had no further bleeding episodes. She has worn her elastic compression stockings and take an ibuprofen as instructed. She has less tenseness and the bulging varicosities. She has not noticed any swelling in the ankle area.  Past Medical History  Diagnosis Date  . ANEMIA 08/28/2010  . CAD (coronary artery disease)     a. s/p CABGx4 in 2001.  . Adenomatous colon polyp 2000  . COPD (chronic obstructive pulmonary disease) ([place])   . Depressive disorder   . GERD 12/19/2009  . Hyperlipidemia   . Hypertension   . HYPOTENSION 08/11/2010  . Osteoarthritis     Lumbar stenosis  . PVD (peripheral vascular disease) ([place])     a. Duplex 03/2013: stable moderate carotid dz - 123456 RICA, [id number] LICA, L subclavian artery to CCA stent widely patent.  b. Prior R fempop bypass graft, R CIA stent.  . Lung abscess ([place]) 2011  . CHF (congestive heart failure) ([place])   . Diverticulosis   . Hiatal hernia     a. Dx 01/2014, placed on Xarelto.  . Aortic stenosis     a. Mild by echo 01/2013.  . Diabetes mellitus without complication ([place])   . Ringing in ears   . Anxiety   . Joint pain   . Stroke [facility])     Social History  Substance Use Topics  . Smoking status: Former Smoker -- 1.50 packs/day for 40 years    Types: Cigarettes    Quit date: 06/29/2000  . Smokeless tobacco: Never Used  . Alcohol Use: No    Family History  Problem Relation Age of Onset  . Diabetes Father   . Heart attack Mother   . Diabetes    . Coronary artery disease    . Colon cancer Neg Hx     Allergies  Allergen Reactions  . Codeine Phosphate     unknown     Current outpatient prescriptions:  .  albuterol (PROVENTIL HFA;VENTOLIN HFA) 108 (90 BASE) MCG/ACT inhaler,  Inhale 2 puffs into the lungs every 4 (four) hours as needed for wheezing or shortness of breath., Disp: , Rfl:  .  amiodarone (PACERONE) 200 MG tablet, Take 0.5 tablets (100 mg total) by mouth daily., Disp: 90 tablet, Rfl: 3 .  apixaban (ELIQUIS) 2.5 MG TABS tablet, Take 2.5 mg by mouth 2 (two) times daily., Disp: , Rfl:  .  aspirin EC 81 MG EC tablet, Take 1 tablet (81 mg total) by mouth daily., Disp: 30 tablet, Rfl: 0 .  atorvastatin (LIPITOR) 40 MG tablet, TAKE 1 TABLET (40 MG TOTAL) BY MOUTH EVERY EVENING., Disp: 90 tablet, Rfl: 1 .  Diclofenac Sodium 1 % CREA, Place 1 application onto the skin 2 (two) times daily., Disp: 120 g, Rfl: 4 .  diltiazem (CARDIZEM CD) 120 MG 24 hr capsule, TAKE 1 CAPSULE (120 MG TOTAL) BY MOUTH DAILY., Disp: 30 capsule, Rfl: 5 .  furosemide (LASIX) 20 MG tablet, Take 1 tablet (20 mg total) by mouth daily., Disp: 90 tablet, Rfl: 3 .  gabapentin (NEURONTIN) 600 MG tablet, TAKE 1 TABLET (600 MG TOTAL) BY MOUTH 3 (THREE) TIMES DAILY., Disp: 270 tablet, Rfl: 1 .  glucose blood (ACCU-CHEK AVIVA PLUS) test strip, Test daily.,  Disp: 100 each, Rfl: 5 .  HYDROcodone-acetaminophen (NORCO/VICODIN) 5-325 MG per tablet, Take 0.5 tablets by mouth every 6 (six) hours as needed for moderate pain., Disp: 90 tablet, Rfl: 0 .  KLOR-CON 10 10 MEQ tablet, TAKE 1 TABLET (10 MEQ TOTAL) BY MOUTH DAILY., Disp: 30 tablet, Rfl: 6 .  LORazepam (ATIVAN) 0.5 MG tablet, TAKE ONE TABLET BY MOUTH TWICE A DAY, Disp: 60 tablet, Rfl: 2 .  metFORMIN (GLUCOPHAGE) 500 MG tablet, TAKE 1 TABLET (500 MG TOTAL) BY MOUTH 2 (TWO) TIMES DAILY WITH A MEAL., Disp: 180 tablet, Rfl: 1 .  metoprolol tartrate (LOPRESSOR) 25 MG tablet, Take 1 tablet (25 mg total) by mouth 2 (two) times daily., Disp: 180 tablet, Rfl: 3 .  Multiple Vitamins-Minerals (CENTRUM SILVER ULTRA WOMENS PO), Take 1 capsule by mouth daily. , Disp: , Rfl:  .  nitroGLYCERIN (NITROSTAT) 0.4 MG SL tablet, Place 0.4 mg under the tongue every 5 (five)  minutes as needed for chest pain., Disp: , Rfl:  .  promethazine (PHENERGAN) 25 MG tablet, TAKE 1 TABLET BY MOUTH EVERY 6 HOURS AS NEEDED FOR NAUSEA OR VOMITING, Disp: 20 tablet, Rfl: 1 .  sertraline (ZOLOFT) 50 MG tablet, Take 25 mg by mouth daily., Disp: , Rfl:  .  SPIRIVA HANDIHALER 18 MCG inhalation capsule, PLACE 1 CAPSULE (18 MCG TOTAL) INTO INHALER AND INHALE DAILY., Disp: 30 capsule, Rfl: 5 .  triamcinolone cream (KENALOG) 0.1 %, Apply 1 application topically as directed. , Disp: , Rfl: 0  Filed Vitals:   11/25/15 1030 11/25/15 1037  BP: 170/60 174/63  Pulse: 56   Height: 5' 2.5" (1.588 m)   Weight: 146 lb 3.2 oz (66.316 kg)   SpO2: 95%     Body mass index is 26.3 kg/(m^2).           Review of Systems Denies chest pain. Does continue to have nasal oxygen at night as per her pulmonologist. Has had a URI recently some coughing but no chills and fever. We will be checking with her medical doctor for further treatment.     Objective:   Physical Exam BP 174/63 mmHg  Pulse 56  Ht 5' 2.5" (1.588 m)  Wt 146 lb 3.2 oz (66.316 kg)  BMI 26.30 kg/m2  SpO2 95%   Gen. Well-developed well-nourished female in no apparent distress alert and oriented 3 Lungs no rhonchi or wheezing Cardiovascular regular rhythm no murmurs Right lower extremity with mild tenderness to the palpation in the thigh area. Varicosities do not seem to be quite as intense as previously.    today I ordered a venous duplex exam the right leg which I reviewed and interpreted. There is no DVT. There is good closure of the right great saphenous vein up to near the saphenofemoral junction.     Assessment:      successful laser ablation right great saphenous vein with painful varicosities and history of bleeding pharynx right ankle     Plan:      return in 3 months to determine if stab phlebectomy and sclerotherapy of previous bleeding site will be indicated

## 2015-11-27 ENCOUNTER — Encounter (HOSPITAL_COMMUNITY): Payer: Self-pay | Admitting: Emergency Medicine

## 2015-11-27 ENCOUNTER — Emergency Department (HOSPITAL_COMMUNITY): Payer: Medicare Other

## 2015-11-27 ENCOUNTER — Observation Stay (HOSPITAL_COMMUNITY)
Admission: EM | Admit: 2015-11-27 | Discharge: 2015-11-28 | Disposition: A | Discharge status: Home / Self Care | Payer: Medicare Other | Attending: Internal Medicine | Admitting: Internal Medicine

## 2015-11-27 DIAGNOSIS — I739 Peripheral vascular disease, unspecified: Secondary | ICD-10-CM | POA: Insufficient documentation

## 2015-11-27 DIAGNOSIS — I480 Paroxysmal atrial fibrillation: Secondary | ICD-10-CM | POA: Diagnosis present

## 2015-11-27 DIAGNOSIS — E785 Hyperlipidemia, unspecified: Secondary | ICD-10-CM | POA: Diagnosis present

## 2015-11-27 DIAGNOSIS — N390 Urinary tract infection, site not specified: Secondary | ICD-10-CM | POA: Diagnosis present

## 2015-11-27 DIAGNOSIS — E119 Type 2 diabetes mellitus without complications: Secondary | ICD-10-CM | POA: Diagnosis not present

## 2015-11-27 DIAGNOSIS — Z7901 Long term (current) use of anticoagulants: Secondary | ICD-10-CM | POA: Insufficient documentation

## 2015-11-27 DIAGNOSIS — J441 Chronic obstructive pulmonary disease with (acute) exacerbation: Secondary | ICD-10-CM | POA: Diagnosis not present

## 2015-11-27 DIAGNOSIS — Z885 Allergy status to narcotic agent status: Secondary | ICD-10-CM | POA: Insufficient documentation

## 2015-11-27 DIAGNOSIS — D649 Anemia, unspecified: Secondary | ICD-10-CM | POA: Diagnosis present

## 2015-11-27 DIAGNOSIS — J9621 Acute and chronic respiratory failure with hypoxia: Secondary | ICD-10-CM | POA: Diagnosis present

## 2015-11-27 DIAGNOSIS — I509 Heart failure, unspecified: Secondary | ICD-10-CM | POA: Insufficient documentation

## 2015-11-27 DIAGNOSIS — Z794 Long term (current) use of insulin: Secondary | ICD-10-CM

## 2015-11-27 DIAGNOSIS — I63411 Cerebral infarction due to embolism of right middle cerebral artery: Secondary | ICD-10-CM | POA: Diagnosis present

## 2015-11-27 DIAGNOSIS — Z8673 Personal history of transient ischemic attack (TIA), and cerebral infarction without residual deficits: Secondary | ICD-10-CM | POA: Insufficient documentation

## 2015-11-27 DIAGNOSIS — K449 Diaphragmatic hernia without obstruction or gangrene: Secondary | ICD-10-CM | POA: Insufficient documentation

## 2015-11-27 DIAGNOSIS — I10 Essential (primary) hypertension: Secondary | ICD-10-CM | POA: Diagnosis present

## 2015-11-27 DIAGNOSIS — I110 Hypertensive heart disease with heart failure: Secondary | ICD-10-CM | POA: Insufficient documentation

## 2015-11-27 DIAGNOSIS — Z9981 Dependence on supplemental oxygen: Secondary | ICD-10-CM | POA: Insufficient documentation

## 2015-11-27 DIAGNOSIS — F329 Major depressive disorder, single episode, unspecified: Secondary | ICD-10-CM | POA: Insufficient documentation

## 2015-11-27 DIAGNOSIS — K219 Gastro-esophageal reflux disease without esophagitis: Secondary | ICD-10-CM | POA: Insufficient documentation

## 2015-11-27 DIAGNOSIS — I2510 Atherosclerotic heart disease of native coronary artery without angina pectoris: Secondary | ICD-10-CM | POA: Insufficient documentation

## 2015-11-27 HISTORY — DX: Unspecified osteoarthritis, unspecified site: M19.90

## 2015-11-27 HISTORY — DX: Personal history of other medical treatment: Z92.89

## 2015-11-27 HISTORY — DX: Type 2 diabetes mellitus without complications: E11.9

## 2015-11-27 HISTORY — DX: Acute embolism and thrombosis of unspecified deep veins of unspecified lower extremity: I82.409

## 2015-11-27 HISTORY — DX: Unspecified atrial fibrillation: I48.91

## 2015-11-27 HISTORY — DX: Dependence on supplemental oxygen: Z99.81

## 2015-11-27 LAB — CBC
HCT: 28.5 % — ABNORMAL LOW (ref 36.0–46.0)
Hemoglobin: 8.6 g/dL — ABNORMAL LOW (ref 12.0–15.0)
MCH: 25.9 pg — ABNORMAL LOW (ref 26.0–34.0)
MCHC: 30.2 g/dL (ref 30.0–36.0)
MCV: 85.8 fL (ref 78.0–100.0)
Platelets: 175 K/uL (ref 150–400)
RBC: 3.32 MIL/uL — ABNORMAL LOW (ref 3.87–5.11)
RDW: 16.1 % — ABNORMAL HIGH (ref 11.5–15.5)
WBC: 10.7 K/uL — ABNORMAL HIGH (ref 4.0–10.5)

## 2015-11-27 LAB — URINE MICROSCOPIC-ADD ON: RBC / HPF: NONE SEEN RBC/hpf (ref 0–5)

## 2015-11-27 LAB — URINALYSIS, ROUTINE W REFLEX MICROSCOPIC
Bilirubin Urine: NEGATIVE
GLUCOSE, UA: NEGATIVE mg/dL
Hgb urine dipstick: NEGATIVE
KETONES UR: NEGATIVE mg/dL
LEUKOCYTES UA: NEGATIVE
NITRITE: POSITIVE — AB
PROTEIN: NEGATIVE mg/dL
Specific Gravity, Urine: 1.007 (ref 1.005–1.030)
pH: 5.5 (ref 5.0–8.0)

## 2015-11-27 LAB — BASIC METABOLIC PANEL WITH GFR
Anion gap: 11 (ref 5–15)
BUN: 21 mg/dL — ABNORMAL HIGH (ref 6–20)
CO2: 25 mmol/L (ref 22–32)
Calcium: 8.8 mg/dL — ABNORMAL LOW (ref 8.9–10.3)
Chloride: 101 mmol/L (ref 101–111)
Creatinine, Ser: 1.41 mg/dL — ABNORMAL HIGH (ref 0.44–1.00)
GFR calc Af Amer: 41 mL/min — ABNORMAL LOW
GFR calc non Af Amer: 36 mL/min — ABNORMAL LOW
Glucose, Bld: 109 mg/dL — ABNORMAL HIGH (ref 65–99)
Potassium: 3.8 mmol/L (ref 3.5–5.1)
Sodium: 137 mmol/L (ref 135–145)

## 2015-11-27 LAB — INFLUENZA PANEL BY PCR (TYPE A & B)
H1N1 flu by pcr: NOT DETECTED
Influenza A By PCR: NEGATIVE
Influenza B By PCR: NEGATIVE

## 2015-11-27 LAB — TROPONIN I: Troponin I: <0.03 ng/mL

## 2015-11-27 LAB — GLUCOSE, CAPILLARY
GLUCOSE-CAPILLARY: 214 mg/dL — AB (ref 65–99)
GLUCOSE-CAPILLARY: 211 mg/dL — AB (ref 65–99)

## 2015-11-27 LAB — BRAIN NATRIURETIC PEPTIDE: B Natriuretic Peptide: 540.4 pg/mL — ABNORMAL HIGH (ref 0.0–100.0)

## 2015-11-27 MED ORDER — IPRATROPIUM-ALBUTEROL 0.5-2.5 (3) MG/3ML IN SOLN
3.0000 mL | Freq: Once | RESPIRATORY_TRACT | Status: AC
  Administered 2015-11-27: 3 mL via RESPIRATORY_TRACT
  Filled 2015-11-27: qty 3

## 2015-11-27 MED ORDER — LEVOFLOXACIN IN D5W 250 MG/50ML IV SOLN
250.0000 mg | INTRAVENOUS | Status: DC
  Filled 2015-11-27: qty 50

## 2015-11-27 MED ORDER — INSULIN ASPART 100 UNIT/ML ~~LOC~~ SOLN: 0.0000 [IU] | Freq: Every day | SUBCUTANEOUS | Status: DC

## 2015-11-27 MED ORDER — ASPIRIN EC 81 MG PO TBEC
81.0000 mg | DELAYED_RELEASE_TABLET | Freq: Every day | ORAL | Status: DC
  Administered 2015-11-28: 81 mg via ORAL
  Filled 2015-11-27: qty 1

## 2015-11-27 MED ORDER — METHYLPREDNISOLONE SODIUM SUCC 125 MG IJ SOLR
80.0000 mg | Freq: Once | INTRAMUSCULAR | Status: AC
  Administered 2015-11-27: 80 mg via INTRAVENOUS
  Filled 2015-11-27: qty 2

## 2015-11-27 MED ORDER — LEVOFLOXACIN IN D5W 500 MG/100ML IV SOLN
500.0000 mg | Freq: Once | INTRAVENOUS | Status: AC
  Administered 2015-11-27: 500 mg via INTRAVENOUS
  Filled 2015-11-27: qty 100

## 2015-11-27 MED ORDER — ATORVASTATIN CALCIUM 40 MG PO TABS
40.0000 mg | ORAL_TABLET | Freq: Every day | ORAL | Status: DC
  Administered 2015-11-27: 40 mg via ORAL
  Filled 2015-11-27: qty 1

## 2015-11-27 MED ORDER — ACETAMINOPHEN 325 MG PO TABS: 650.0000 mg | ORAL_TABLET | Freq: Four times a day (QID) | ORAL | Status: DC | PRN

## 2015-11-27 MED ORDER — SODIUM CHLORIDE 0.9 % IV SOLN
INTRAVENOUS | Status: DC
  Administered 2015-11-27: 15:00:00 via INTRAVENOUS

## 2015-11-27 MED ORDER — INSULIN ASPART 100 UNIT/ML ~~LOC~~ SOLN
0.0000 [IU] | Freq: Three times a day (TID) | SUBCUTANEOUS | Status: DC
  Administered 2015-11-27: 3 [IU] via SUBCUTANEOUS
  Administered 2015-11-28 (×2): 2 [IU] via SUBCUTANEOUS

## 2015-11-27 MED ORDER — ONDANSETRON HCL 4 MG/2ML IJ SOLN: 4.0000 mg | Freq: Four times a day (QID) | INTRAMUSCULAR | Status: DC | PRN

## 2015-11-27 MED ORDER — TIOTROPIUM BROMIDE MONOHYDRATE 18 MCG IN CAPS
18.0000 ug | ORAL_CAPSULE | Freq: Every day | RESPIRATORY_TRACT | Status: DC
  Filled 2015-11-27: qty 5

## 2015-11-27 MED ORDER — GABAPENTIN 600 MG PO TABS
600.0000 mg | ORAL_TABLET | Freq: Three times a day (TID) | ORAL | Status: DC
  Administered 2015-11-27 – 2015-11-28 (×4): 600 mg via ORAL
  Filled 2015-11-27 (×4): qty 1

## 2015-11-27 MED ORDER — INSULIN ASPART 100 UNIT/ML ~~LOC~~ SOLN
0.0000 [IU] | Freq: Every day | SUBCUTANEOUS | Status: DC
  Administered 2015-11-27: 2 [IU] via SUBCUTANEOUS

## 2015-11-27 MED ORDER — METHYLPREDNISOLONE SODIUM SUCC 125 MG IJ SOLR
60.0000 mg | Freq: Four times a day (QID) | INTRAMUSCULAR | Status: DC
  Administered 2015-11-27 – 2015-11-28 (×4): 60 mg via INTRAVENOUS
  Filled 2015-11-27 (×4): qty 2

## 2015-11-27 MED ORDER — METOPROLOL TARTRATE 25 MG PO TABS
25.0000 mg | ORAL_TABLET | Freq: Two times a day (BID) | ORAL | Status: DC
  Administered 2015-11-27 – 2015-11-28 (×2): 25 mg via ORAL
  Filled 2015-11-27 (×2): qty 1

## 2015-11-27 MED ORDER — ONDANSETRON HCL 4 MG PO TABS: 4.0000 mg | ORAL_TABLET | Freq: Four times a day (QID) | ORAL | Status: DC | PRN

## 2015-11-27 MED ORDER — ACETAMINOPHEN 650 MG RE SUPP: 650.0000 mg | Freq: Four times a day (QID) | RECTAL | Status: DC | PRN

## 2015-11-27 MED ORDER — IPRATROPIUM-ALBUTEROL 0.5-2.5 (3) MG/3ML IN SOLN
3.0000 mL | Freq: Four times a day (QID) | RESPIRATORY_TRACT | Status: DC
  Administered 2015-11-27 – 2015-11-28 (×5): 3 mL via RESPIRATORY_TRACT
  Filled 2015-11-27: qty 6
  Filled 2015-11-27 (×3): qty 3

## 2015-11-27 MED ORDER — DILTIAZEM HCL ER COATED BEADS 120 MG PO CP24
120.0000 mg | ORAL_CAPSULE | Freq: Every day | ORAL | Status: DC
  Administered 2015-11-28: 120 mg via ORAL
  Filled 2015-11-27: qty 1

## 2015-11-27 MED ORDER — APIXABAN 5 MG PO TABS
5.0000 mg | ORAL_TABLET | Freq: Two times a day (BID) | ORAL | Status: DC
  Administered 2015-11-27 – 2015-11-28 (×2): 5 mg via ORAL
  Filled 2015-11-27 (×2): qty 1

## 2015-11-27 MED ORDER — IPRATROPIUM-ALBUTEROL 0.5-2.5 (3) MG/3ML IN SOLN: 3.0000 mL | RESPIRATORY_TRACT | Status: DC | PRN

## 2015-11-27 MED ORDER — SERTRALINE HCL 50 MG PO TABS
25.0000 mg | ORAL_TABLET | Freq: Every day | ORAL | Status: DC
  Administered 2015-11-28: 25 mg via ORAL
  Filled 2015-11-27: qty 1

## 2015-11-27 MED ORDER — AMIODARONE HCL 200 MG PO TABS
100.0000 mg | ORAL_TABLET | Freq: Every day | ORAL | Status: DC
  Administered 2015-11-28: 100 mg via ORAL
  Filled 2015-11-27: qty 1

## 2015-11-27 MED ORDER — INSULIN ASPART 100 UNIT/ML ~~LOC~~ SOLN: 0.0000 [IU] | Freq: Three times a day (TID) | SUBCUTANEOUS | Status: DC

## 2015-11-27 MED ORDER — METFORMIN HCL 500 MG PO TABS
500.0000 mg | ORAL_TABLET | Freq: Two times a day (BID) | ORAL | Status: DC
  Administered 2015-11-27 – 2015-11-28 (×2): 500 mg via ORAL
  Filled 2015-11-27 (×2): qty 1

## 2015-11-27 NOTE — ED Notes (Signed)
Pt transported to xray in nad.  

## 2015-11-27 NOTE — ED Notes (Signed)
Patient transported to CT 

## 2015-11-27 NOTE — ED Notes (Addendum)
Per EMS: pt from PCP for eval of dry cough x3 days and malaise. Pt reports she has noticed more sob with exertion so went to pcp. Lung sounds noted to be rhonchi and wheezing, PCP sent to ED for eval of possible PNA due to pt lung sounds and rattling cough. Pt denies any cp at this time. States she is not sob at this time, only when she is trying to walk. On home 02 at night time PRN for COPD, noted to be on 3L O2 upon arrival to ED. nad noted.   Albuterol breathing treatment given at PCP.

## 2015-11-27 NOTE — ED Provider Notes (Signed)
CSN: [id number]     Arrival date & time 11/27/15  1118 History   First MD Initiated Contact with Patient 11/27/15 1131     Chief Complaint  Patient presents with  . Cough  . Shortness of Breath     (Consider location/radiation/quality/duration/timing/severity/associated sxs/prior Treatment) HPI Comments: Patient with history of COPD and 2 L home oxygen, atrial fibrillation of eliquis presenting with a 3 day history of dry cough, malaise, shortness of breath and wheezing. She went to urgent care today and there was concern for pneumonia. Daughter reports when patient was leaving urgent care she had an episode of slurred speech that lasted a few seconds and has since resolved. No focal weakness, numbness or tingling. She denies any chest pain. She denies any fever. Her oxygen was increased from 2-3 L and she was given albuterol treatment before transport. She denies any leg pain or leg swelling. No abdominal pain nausea or vomiting. Some runny nose and sore throat.  The history is provided by the patient, the EMS personnel and a relative.    Past Medical History  Diagnosis Date  . ANEMIA 08/28/2010  . CAD (coronary artery disease)     a. s/p CABGx4 in 2001.  . Adenomatous colon polyp 2000  . COPD (chronic obstructive pulmonary disease) ([place])   . Depressive disorder   . GERD 12/19/2009  . Hyperlipidemia   . Hypertension   . HYPOTENSION 08/11/2010  . PVD (peripheral vascular disease) ([place])     a. Duplex 03/2013: stable moderate carotid dz - [id number] RICA, [id number] LICA, L subclavian artery to CCA stent widely patent.  b. Prior R fempop bypass graft, R CIA stent.  . Lung abscess ([place]) 2011  . CHF (congestive heart failure) ([place])   . Diverticulosis   . Aortic stenosis     a. Mild by echo 01/2013.  Marland Kitchen Ringing in ears   . Anxiety   . Joint pain   . On home oxygen therapy     "down to 2L just at night lately" (11/27/2015)  . Atrial fibrillation ([place])   . DVT (deep venous thrombosis) ([facility]) X 1     RLE  . Type II diabetes mellitus ([place])   . History of blood transfusion 2011; 08/2014    "related to abscess on my lung OR; internal bleeding, had dark stools"  . Stroke [facility]) ~ 01/2014    "TIA"  . Hiatal hernia     a. Dx 01/2014, placed on Xarelto.  . Osteoarthritis     Lumbar stenosis  . Arthritis     "hands maily; arms too" (11/27/2015)   Past Surgical History  Procedure Laterality Date  . Subclavian bypass graft Left 07/2004  . Tubal ligation    . Coronary artery bypass graft  05/2000    LIMA-LAD, SVG-OM, SVG-PDA-PL  . Femoral-popliteal bypass graft Right 04/1999  . Angioplasty / stenting iliac Right 07/2000    common iliac PTA with stent placement  . Right leg blockage Right 2003  . Aorta - bilateral femoral artery bypass graft  2004  . Carotid endarterectomy Left 2005  . Lung biopsy  2011    cyst  . Esophagogastroduodenoscopy N/A 08/03/2014    Procedure: ESOPHAGOGASTRODUODENOSCOPY (EGD);  Surgeon: Juanita Craver, MD;  Location: [facility] ENDOSCOPY;  Service: Endoscopy;  Laterality: N/A;  . Colonoscopy Left 08/05/2014    Procedure: COLONOSCOPY;  Surgeon: Juanita Craver, MD;  Location: [place];  Service: Endoscopy;  Laterality: Left;  . Givens capsule study N/A 08/06/2014  Procedure: GIVENS CAPSULE STUDY;  Surgeon: Irene Shipper, MD;  Location: [facility] ENDOSCOPY;  Service: Endoscopy;  Laterality: N/A;  . Left heart catheterization with coronary/graft angiogram  03/11/2014    Procedure: LEFT HEART CATHETERIZATION WITH Beatrix Fetters;  Surgeon: Peter M Martinique, MD;  Location: [facility] CATH LAB;  Service: Cardiovascular;;  . Cataract extraction, bilateral Bilateral ~ 2008   Family History  Problem Relation Age of Onset  . Diabetes Father   . Heart attack Mother   . Diabetes    . Coronary artery disease    . Colon cancer Neg Hx    Social History  Substance Use Topics  . Smoking status: Former Smoker -- 1.50 packs/day for 40 years    Types: Cigarettes    Quit date: 06/29/2000  .  Smokeless tobacco: Never Used  . Alcohol Use: No   OB History    No data available     Review of Systems  Constitutional: Positive for activity change, appetite change and fatigue. Negative for fever.  HENT: Positive for congestion and rhinorrhea. Negative for dental problem.   Eyes: Negative for visual disturbance.  Respiratory: Positive for cough and shortness of breath. Negative for chest tightness.   Cardiovascular: Negative for chest pain and palpitations.  Gastrointestinal: Negative for nausea, vomiting and abdominal pain.  Genitourinary: Negative for dysuria, hematuria, vaginal bleeding and vaginal discharge.  Musculoskeletal: Negative for myalgias and arthralgias.  Skin: Negative for rash.  Neurological: Negative for dizziness, weakness and headaches.  A complete 10 system review of systems was obtained and all systems are negative except as noted in the HPI and PMH.      Allergies  Codeine  Home Medications   Prior to Admission medications   Medication Sig Start Date End Date Taking? Authorizing Provider  amiodarone (PACERONE) 200 MG tablet Take 0.5 tablets (100 mg total) by mouth daily. 11/17/15  Yes Dorothy Spark, MD  aspirin EC 81 MG EC tablet Take 1 tablet (81 mg total) by mouth daily. 08/16/14  Yes Hosie Poisson, MD  atorvastatin (LIPITOR) 40 MG tablet TAKE 1 TABLET (40 MG TOTAL) BY MOUTH EVERY EVENING. 07/16/15  Yes Dorothy Spark, MD  Diclofenac Sodium 1 % CREA Place 1 application onto the skin 2 (two) times daily. Patient taking differently: Place 1 application onto the skin 2 (two) times daily as needed (pain).  09/29/15  Yes Marletta Lor, MD  diltiazem (CARDIZEM CD) 120 MG 24 hr capsule TAKE 1 CAPSULE (120 MG TOTAL) BY MOUTH DAILY. 09/19/15  Yes Dorothy Spark, MD  ELIQUIS 5 MG TABS tablet TAKE 1 TABLET (5 MG TOTAL) BY MOUTH 2 (TWO) TIMES DAILY. 11/26/15  Yes Dorothy Spark, MD  furosemide (LASIX) 20 MG tablet Take 1 tablet (20 mg total) by  mouth daily. 11/04/14  Yes Dorothy Spark, MD  gabapentin (NEURONTIN) 600 MG tablet TAKE 1 TABLET (600 MG TOTAL) BY MOUTH 3 (THREE) TIMES DAILY. 10/27/15  Yes Marletta Lor, MD  KLOR-CON 10 10 MEQ tablet TAKE 1 TABLET (10 MEQ TOTAL) BY MOUTH DAILY. 05/27/15  Yes Marletta Lor, MD  LORazepam (ATIVAN) 0.5 MG tablet TAKE ONE TABLET BY MOUTH TWICE A DAY Patient taking differently: TAKE ONE TABLET BY MOUTH TWICE A DAY AS NEEDED FOR ANXIETY 08/19/15  Yes Marletta Lor, MD  metFORMIN (GLUCOPHAGE) 500 MG tablet TAKE 1 TABLET (500 MG TOTAL) BY MOUTH 2 (TWO) TIMES DAILY WITH A MEAL. 11/11/15  Yes Marletta Lor, MD  metoprolol tartrate (  LOPRESSOR) 25 MG tablet Take 1 tablet (25 mg total) by mouth 2 (two) times daily. 02/18/15  Yes Dorothy Spark, MD  Multiple Vitamins-Minerals (CENTRUM SILVER ULTRA WOMENS PO) Take 1 capsule by mouth daily.    Yes Historical Provider, MD  nitroGLYCERIN (NITROSTAT) 0.4 MG SL tablet Place 0.4 mg under the tongue every 5 (five) minutes as needed for chest pain.   Yes Historical Provider, MD  promethazine (PHENERGAN) 25 MG tablet TAKE 1 TABLET BY MOUTH EVERY 6 HOURS AS NEEDED FOR NAUSEA OR VOMITING 10/17/15  Yes Marletta Lor, MD  sertraline (ZOLOFT) 50 MG tablet Take 25 mg by mouth daily.   Yes Historical Provider, MD  SPIRIVA HANDIHALER 18 MCG inhalation capsule PLACE 1 CAPSULE (18 MCG TOTAL) INTO INHALER AND INHALE DAILY. 05/13/15  Yes Marletta Lor, MD  triamcinolone cream (KENALOG) 0.1 % Apply 1 application topically daily as needed (affected area).  09/26/15  Yes Historical Provider, MD  albuterol (PROVENTIL HFA;VENTOLIN HFA) 108 (90 BASE) MCG/ACT inhaler Inhale 2 puffs into the lungs every 4 (four) hours as needed for wheezing or shortness of breath.    Historical Provider, MD  HYDROcodone-acetaminophen (NORCO/VICODIN) 5-325 MG per tablet Take 0.5 tablets by mouth every 6 (six) hours as needed for moderate pain. Patient not taking: Reported  on 11/27/2015 03/25/14   Marletta Lor, MD   BP 120/28 mmHg  Pulse 78  Temp(Src) 98.3 F (36.8 C) (Oral)  Resp 18  Ht 5\' 2"  (1.575 m)  Wt 144 lb 2.9 oz (65.4 kg)  BMI 26.36 kg/m2  SpO2 95% Physical Exam  Constitutional: She is oriented to person, place, and time. She appears well-developed and well-nourished. No distress.  HENT:  Head: Normocephalic and atraumatic.  Mouth/Throat: Oropharynx is clear and moist. No oropharyngeal exudate.  Eyes: Conjunctivae and EOM are normal. Pupils are equal, round, and reactive to light.  Neck: Normal range of motion. Neck supple.  No meningismus.  Cardiovascular: Normal rate, regular rhythm, normal heart sounds and intact distal pulses.   No murmur heard. Pulmonary/Chest: Effort normal. No respiratory distress. She has wheezes.  Decreased breath sounds with scattered expiratory wheezing  Abdominal: Soft. There is no tenderness. There is no rebound and no guarding.  Musculoskeletal: Normal range of motion. She exhibits no edema or tenderness.  Neurological: She is alert and oriented to person, place, and time. No cranial nerve deficit. She exhibits normal muscle tone. Coordination normal.  No ataxia on finger to nose bilaterally. No pronator drift. 5/5 strength throughout. CN 2-12 intact.Equal grip strength. Sensation intact.   Skin: Skin is warm.  Psychiatric: She has a normal mood and affect. Her behavior is normal.  Nursing note and vitals reviewed.   ED Course  Procedures (including critical care time) Labs Review Labs Reviewed  BASIC METABOLIC PANEL - Abnormal; Notable for the following:    Glucose, Bld 109 (*)    BUN 21 (*)    Creatinine, Ser 1.41 (*)    Calcium 8.8 (*)    GFR calc non Af Amer 36 (*)    GFR calc Af Amer 41 (*)    All other components within normal limits  CBC - Abnormal; Notable for the following:    WBC 10.7 (*)    RBC 3.32 (*)    Hemoglobin 8.6 (*)    HCT 28.5 (*)    MCH 25.9 (*)    RDW 16.1 (*)    All  other components within normal limits  BRAIN NATRIURETIC PEPTIDE -  Abnormal; Notable for the following:    B Natriuretic Peptide 540.4 (*)    All other components within normal limits  URINALYSIS, ROUTINE W REFLEX MICROSCOPIC (NOT AT [facility]) - Abnormal; Notable for the following:    Nitrite POSITIVE (*)    All other components within normal limits  URINE MICROSCOPIC-ADD ON - Abnormal; Notable for the following:    Squamous Epithelial / LPF 0-5 (*)    Bacteria, UA MANY (*)    Casts HYALINE CASTS (*)    All other components within normal limits  GLUCOSE, CAPILLARY - Abnormal; Notable for the following:    Glucose-Capillary 214 (*)    All other components within normal limits  URINE CULTURE  RESPIRATORY VIRUS PANEL  TROPONIN I  INFLUENZA PANEL BY PCR (TYPE A & B, H1N1)  HEMOGLOBIN [id number]  BASIC METABOLIC PANEL  CBC    Imaging Review Dg Chest 2 View  11/27/2015  CLINICAL DATA:  Shortness of breath. EXAM: CHEST  2 VIEW COMPARISON:  August 18, 2015. FINDINGS: The heart size and mediastinal contours are within normal limits. No pneumothorax or pleural effusion is noted. Status post coronary artery bypass graft. Atherosclerosis of thoracic aorta is noted. Stable bibasilar densities are noted concerning for scarring or subsegmental atelectasis. The visualized skeletal structures are unremarkable. IMPRESSION: Stable bibasilar scarring or subsegmental atelectasis. Electronically Signed   By: Marijo Conception, M.D.   On: 11/27/2015 12:43   Ct Head Wo Contrast  11/27/2015  CLINICAL DATA:  74 year old female with generalized malaise and dry cough for the past 3 days. Shortness breath with exertion. EXAM: CT HEAD WITHOUT CONTRAST TECHNIQUE: Contiguous axial images were obtained from the base of the skull through the vertex without intravenous contrast. COMPARISON:  Head CT 05/29/2014. FINDINGS: Mild cerebral atrophy. Patchy areas of decreased attenuation are noted throughout the deep and periventricular  white matter of the cerebral hemispheres bilaterally, compatible with mild chronic microvascular ischemic disease. Small cortical calcifications are again noted in the parietal regions bilaterally. No acute intracranial abnormalities. Specifically, no evidence of acute intracranial hemorrhage, no definite findings of acute/subacute cerebral ischemia, no mass, mass effect, hydrocephalus or abnormal intra or extra-axial fluid collections. Visualized paranasal sinuses and mastoids are well pneumatized. No acute displaced skull fractures are identified. IMPRESSION: 1. No acute intracranial abnormalities. 2. Mild cerebral atrophy and chronic microvascular ischemic changes in cerebral white matter, similar to prior examinations. Electronically Signed   By: Vinnie Langton M.D.   On: 11/27/2015 13:20   I have personally reviewed and evaluated these images and lab results as part of my medical decision-making.   EKG Interpretation   Date/Time:  Thursday November 27 2015 11:33:37 EST Ventricular Rate:  66 PR Interval:  198 QRS Duration: 100 QT Interval:  456 QTC Calculation: 478 R Axis:   30 Text Interpretation:  Sinus rhythm No significant change was found T waves  now upright Confirmed by Wyvonnia Dusky  MD, Keirra Zeimet [phone]) on 11/27/2015  12:02:03 PM      MDM   Final diagnoses:  COPD exacerbation (HCC)  Urinary tract infection without hematuria, site unspecified  from urgent care with shortness of breath, cough congestion history of COPD on oxygen. Also had an episode of slurred speech that has since resolved.  Nebs, steroids, CXR, CT head.  No infiltrate.  O2 increased to 3L, usually on 2l at night only. Treat for bronchitis, possible UTI. Levaquin. Slurred speech has resolved, CT head negative.  On eliquis.  Doubt CVA.  Very dyspneic with exertion  and O2 saturation decreased to 80s. Admission d/w NP Lissa Merlin.   Ezequiel Essex, MD 11/27/15 [phone]

## 2015-11-27 NOTE — H&P (Signed)
[facility] History and Physical                                                                                    Aslynn Zoltowski, is a 74 y.o. female  MRN: [id number]   DOB - [birth year]  Admit Date - 11/27/2015  Outpatient Primary MD for the patient is Nyoka Cowden, MD  Referring MD: [place] / ER  PMH: Past Medical History  Diagnosis Date  . ANEMIA 08/28/2010  . CAD (coronary artery disease)     a. s/p CABGx4 in 2001.  . Adenomatous colon polyp 2000  . COPD (chronic obstructive pulmonary disease) ([place])   . Depressive disorder   . GERD 12/19/2009  . Hyperlipidemia   . Hypertension   . HYPOTENSION 08/11/2010  . Osteoarthritis     Lumbar stenosis  . PVD (peripheral vascular disease) ([place])     a. Duplex 03/2013: stable moderate carotid dz - [id number] RICA, [id number] LICA, L subclavian artery to CCA stent widely patent.  b. Prior R fempop bypass graft, R CIA stent.  . Lung abscess ([place]) 2011  . CHF (congestive heart failure) ([place])   . Diverticulosis   . Hiatal hernia     a. Dx 01/2014, placed on Xarelto.  . Aortic stenosis     a. Mild by echo 01/2013.  . Diabetes mellitus without complication ([place])   . Ringing in ears   . Anxiety   . Joint pain   . Stroke [facility])       PSH: Past Surgical History  Procedure Laterality Date  . L subclavian bypass  07/2004  . Tubal ligation    . Coronary artery bypass graft  05/2000    LIMA-LAD, SVG-OM, SVG-PDA-PL  . Femoral popliteal bypass-r  04/1999  . Right common iliac pta with stent placement  07/2000  . Right leg blockage  2003  . Aorta bifemoral bypass graft  2004  . Left cea  2005  . Lung cyst biopsy  2011  . Esophagogastroduodenoscopy N/A 08/03/2014    Procedure: ESOPHAGOGASTRODUODENOSCOPY (EGD);  Surgeon: Juanita Craver, MD;  Location: [facility] ENDOSCOPY;  Service: Endoscopy;  Laterality: N/A;  . Colonoscopy Left 08/05/2014    Procedure: COLONOSCOPY;  Surgeon: Juanita Craver, MD;  Location: [facility];  Service: Endoscopy;   Laterality: Left;  Freda Munro capsule study N/A 08/06/2014    Procedure: GIVENS CAPSULE STUDY;  Surgeon: Irene Shipper, MD;  Location: [facility];  Service: Endoscopy;  Laterality: N/A;  . Left heart catheterization with coronary/graft angiogram  03/11/2014    Procedure: LEFT HEART CATHETERIZATION WITH Beatrix Fetters;  Surgeon: Peter M Martinique, MD;  Location: [facility] CATH LAB;  Service: Cardiovascular;;     CC:  Chief Complaint  Patient presents with  . Cough  . Shortness of Breath     HPI: 74 year old female patient history of CAD, prior embolic CVA, anemia, dyslipidemia, diabetes on oral hypoglycemic agents, paroxysmal atrial fibrillation on eliquis, hypertension and COPD. beginning this past Sunday patient began feeling poorly and having increasing cough and shortness of breath. She typically utilizes oxygen only at bedtime but began to utilize it more so during the day over the past 3  days. She not had any fevers or chills. She has chronic bronchitis and although the appearance of her sputum has not changed from its chronic appearance of yellow she was coughing more and having more sputum out. She's not had any weight gain or other symptoms consistent with heart failure such as orthopnea or dyspnea on exertion. She has been complaining of more fatigue and malaise. She reports she received influenza vaccine this year. Her daughter initially took her to the urgent care where she had an episode of slurred speech that lasted a few seconds but patient has returned to baseline. This was not associated with any focal weakness, numbness or tingling. She's not had any cardiac symptoms. No urinary tract infection type symptomatology.  ER Evaluation and treatment: Low-grade fever 99.1 otherwise hemodynamically stable and has been stable on 2-3 L nasal cannula oxygen on the EDP reported with ambulation O2 saturations dropped to the mid 70s. Two-view chest x-ray: Stable bibasilar scarring and subsegmental  atelectasis CT of the head without contrast: no acute intracranial abnormalities, mild cerebral atrophy and chronic microvascular ischemic changes similar to prior exam. EKG: Sinus rhythm without ischemic changes BUN and creatinine stable and at baseline BNP slightly elevated at 540 with normal troponin WBCs 10,700 noted differential completed Hgb stable at baseline of between 8 and 9 Urinalysis concerning for possible UTI-urine culture ordered 3 DuoNeb nebulizers with some improvement in her symptoms IV Solu-Medrol 80 mg   Review of Systems   In addition to the HPI above,  No Fever-chills, myalgias or other constitutional symptoms No Headache, changes with Vision or hearing, new weakness, tingling, numbness in any extremity, No problems swallowing food or Liquids, indigestion/reflux No Chest pain, palpitations, orthopnea or DOE No Abdominal pain, N/V; no melena or hematochezia, no dark tarry stools, Bowel movements are regular, No dysuria, hematuria or flank pain No new skin rashes, lesions, masses or bruises, No new joints pains-aches No recent weight gain or loss No polyuria, polydypsia or polyphagia,  *A full 10 point Review of Systems was done, except as stated above, all other Review of Systems were negative.  Social History Social History  Substance Use Topics  . Smoking status: Former Smoker -- 1.50 packs/day for 40 years    Types: Cigarettes    Quit date: 06/29/2000  . Smokeless tobacco: Never Used  . Alcohol Use: No    Resides at: Private residence  Lives with: Alone  Ambulatory status: Without assistive devices   Family History Family History  Problem Relation Age of Onset  . Diabetes Father   . Heart attack Mother   . Diabetes    . Coronary artery disease    . Colon cancer Neg Hx      Prior to Admission medications   Medication Sig Start Date End Date Taking? Authorizing Provider  amiodarone (PACERONE) 200 MG tablet Take 0.5 tablets (100 mg total)  by mouth daily. 11/17/15  Yes Dorothy Spark, MD  apixaban (ELIQUIS) 2.5 MG TABS tablet Take 2.5 mg by mouth 2 (two) times daily.   Yes Historical Provider, MD  aspirin EC 81 MG EC tablet Take 1 tablet (81 mg total) by mouth daily. 08/16/14  Yes Hosie Poisson, MD  atorvastatin (LIPITOR) 40 MG tablet TAKE 1 TABLET (40 MG TOTAL) BY MOUTH EVERY EVENING. 07/16/15  Yes Dorothy Spark, MD  Diclofenac Sodium 1 % CREA Place 1 application onto the skin 2 (two) times daily. 09/29/15  Yes Marletta Lor, MD  diltiazem [facility] CD) 120  MG 24 hr capsule TAKE 1 CAPSULE (120 MG TOTAL) BY MOUTH DAILY. 09/19/15  Yes Dorothy Spark, MD  furosemide (LASIX) 20 MG tablet Take 1 tablet (20 mg total) by mouth daily. 11/04/14  Yes Dorothy Spark, MD  gabapentin (NEURONTIN) 600 MG tablet TAKE 1 TABLET (600 MG TOTAL) BY MOUTH 3 (THREE) TIMES DAILY. 10/27/15  Yes Marletta Lor, MD  HYDROcodone-acetaminophen (NORCO/VICODIN) 5-325 MG per tablet Take 0.5 tablets by mouth every 6 (six) hours as needed for moderate pain. 03/25/14  Yes Marletta Lor, MD  KLOR-CON 10 10 MEQ tablet TAKE 1 TABLET (10 MEQ TOTAL) BY MOUTH DAILY. 05/27/15  Yes Marletta Lor, MD  LORazepam (ATIVAN) 0.5 MG tablet TAKE ONE TABLET BY MOUTH TWICE A DAY 08/19/15  Yes Marletta Lor, MD  metFORMIN (GLUCOPHAGE) 500 MG tablet TAKE 1 TABLET (500 MG TOTAL) BY MOUTH 2 (TWO) TIMES DAILY WITH A MEAL. 11/11/15  Yes Marletta Lor, MD  metoprolol tartrate (LOPRESSOR) 25 MG tablet Take 1 tablet (25 mg total) by mouth 2 (two) times daily. 02/18/15  Yes Dorothy Spark, MD  Multiple Vitamins-Minerals (CENTRUM SILVER ULTRA WOMENS PO) Take 1 capsule by mouth daily.    Yes Historical Provider, MD  nitroGLYCERIN (NITROSTAT) 0.4 MG SL tablet Place 0.4 mg under the tongue every 5 (five) minutes as needed for chest pain.   Yes Historical Provider, MD  promethazine (PHENERGAN) 25 MG tablet TAKE 1 TABLET BY MOUTH EVERY 6 HOURS AS NEEDED FOR  NAUSEA OR VOMITING 10/17/15  Yes Marletta Lor, MD  sertraline (ZOLOFT) 50 MG tablet Take 25 mg by mouth daily.   Yes Historical Provider, MD  SPIRIVA HANDIHALER 18 MCG inhalation capsule PLACE 1 CAPSULE (18 MCG TOTAL) INTO INHALER AND INHALE DAILY. 05/13/15  Yes Marletta Lor, MD  triamcinolone cream (KENALOG) 0.1 % Apply 1 application topically as directed.  09/26/15  Yes Historical Provider, MD  albuterol (PROVENTIL HFA;VENTOLIN HFA) 108 (90 BASE) MCG/ACT inhaler Inhale 2 puffs into the lungs every 4 (four) hours as needed for wheezing or shortness of breath.    Historical Provider, MD  ELIQUIS 5 MG TABS tablet TAKE 1 TABLET (5 MG TOTAL) BY MOUTH 2 (TWO) TIMES DAILY. Patient not taking: Reported on 11/27/2015 11/26/15   Dorothy Spark, MD  glucose blood (ACCU-CHEK AVIVA PLUS) test strip Test daily. 02/11/15   Marletta Lor, MD    Allergies  Allergen Reactions  . Codeine Nausea And Vomiting    Physical Exam  Vitals  Blood pressure 147/52, pulse 65, temperature 99.1 F (37.3 C), temperature source Oral, resp. rate 14, height 5\' 2"  (1.575 m), weight 142 lb (64.411 kg), SpO2 97 %.   General:  In no acute distress, appears healthy and well nourished  Psych:  Normal affect, Denies Suicidal or Homicidal ideations, Awake Alert, Oriented X 3. Speech and thought patterns are clear and appropriate, no apparent short term memory deficits  Neuro:   No focal neurological deficits, CN II through XII intact, Strength 5/5 all 4 extremities, Sensation intact all 4 extremities.  ENT:  Ears and Eyes appear Normal, Conjunctivae clear, PER. Moist oral mucosa without erythema or exudates.  Neck:  Supple, No lymphadenopathy appreciated  Respiratory:  Symmetrical chest wall movement, wet sounding cough with scattered wheezes and some diminished breath sounds immediately after paroxysmal coughing episode, 2 L oxygen  Cardiac:  RRR, No Murmurs, no LE edema noted, no JVD, No carotid  bruits, peripheral pulses palpable at 2+  Abdomen:  Positive bowel sounds, Soft, Non tender, Non distended,  No masses appreciated, no obvious hepatosplenomegaly  Skin:  No Cyanosis, Normal Skin Turgor, No Skin Rash or Bruise.  Extremities: Symmetrical without obvious trauma or injury,  no effusions.  Data Review  CBC  Recent Labs Lab 11/27/15 1140  WBC 10.7*  HGB 8.6*  HCT 28.5*  PLT 175  MCV 85.8  MCH 25.9*  MCHC 30.2  RDW 16.1*    Chemistries   Recent Labs Lab 11/27/15 1140  NA 137  K 3.8  CL 101  CO2 25  GLUCOSE 109*  BUN 21*  CREATININE 1.41*  CALCIUM 8.8*    estimated creatinine clearance is 30.8 mL/min (by C-G formula based on Cr of 1.41).  No results for input(s): TSH, T4TOTAL, T3FREE, THYROIDAB in the last 72 hours.  Invalid input(s): FREET3  Coagulation profile No results for input(s): INR, PROTIME in the last 168 hours.  No results for input(s): DDIMER in the last 72 hours.  Cardiac Enzymes  Recent Labs Lab 11/27/15 1140  TROPONINI <0.03    Invalid input(s): POCBNP  Urinalysis    Component Value Date/Time   COLORURINE YELLOW 11/27/2015 [place] 11/27/2015 1337   LABSPEC 1.007 11/27/2015 1337   PHURINE 5.5 11/27/2015 1337   GLUCOSEU NEGATIVE 11/27/2015 1337   GLUCOSEU NEGATIVE 02/18/2015 0913   HGBUR NEGATIVE 11/27/2015 1337   HGBUR negative 09/01/2009 0747   BILIRUBINUR NEGATIVE 11/27/2015 1337   BILIRUBINUR n 07/18/2013 1604   KETONESUR NEGATIVE 11/27/2015 1337   PROTEINUR NEGATIVE 11/27/2015 1337   PROTEINUR n 07/18/2013 1604   UROBILINOGEN 0.2 02/18/2015 0913   UROBILINOGEN 1.0 07/18/2013 1604   NITRITE POSITIVE* 11/27/2015 1337   NITRITE positive 07/18/2013 1604   LEUKOCYTESUR NEGATIVE 11/27/2015 1337    Imaging results:   Dg Chest 2 View  11/27/2015  CLINICAL DATA:  Shortness of breath. EXAM: CHEST  2 VIEW COMPARISON:  August 18, 2015. FINDINGS: The heart size and mediastinal contours are  within normal limits. No pneumothorax or pleural effusion is noted. Status post coronary artery bypass graft. Atherosclerosis of thoracic aorta is noted. Stable bibasilar densities are noted concerning for scarring or subsegmental atelectasis. The visualized skeletal structures are unremarkable. IMPRESSION: Stable bibasilar scarring or subsegmental atelectasis. Electronically Signed   By: Marijo Conception, M.D.   On: 11/27/2015 12:43   Ct Head Wo Contrast  11/27/2015  CLINICAL DATA:  74 year old female with generalized malaise and dry cough for the past 3 days. Shortness breath with exertion. EXAM: CT HEAD WITHOUT CONTRAST TECHNIQUE: Contiguous axial images were obtained from the base of the skull through the vertex without intravenous contrast. COMPARISON:  Head CT 05/29/2014. FINDINGS: Mild cerebral atrophy. Patchy areas of decreased attenuation are noted throughout the deep and periventricular white matter of the cerebral hemispheres bilaterally, compatible with mild chronic microvascular ischemic disease. Small cortical calcifications are again noted in the parietal regions bilaterally. No acute intracranial abnormalities. Specifically, no evidence of acute intracranial hemorrhage, no definite findings of acute/subacute cerebral ischemia, no mass, mass effect, hydrocephalus or abnormal intra or extra-axial fluid collections. Visualized paranasal sinuses and mastoids are well pneumatized. No acute displaced skull fractures are identified. IMPRESSION: 1. No acute intracranial abnormalities. 2. Mild cerebral atrophy and chronic microvascular ischemic changes in cerebral white matter, similar to prior examinations. Electronically Signed   By: Vinnie Langton M.D.   On: 11/27/2015 13:20     EKG: (Independently reviewed) sinus rhythm with ventricular rate 66 bpm, QTC  4 and 78 ms, no ischemic changes noted   Assessment & Plan  Principal Problem:     Acute on chronic respiratory failure with hypoxia /COPD  exacerbation/known chronic bronchitis -Admit observation status to medical floor -Noted with exertional hypoxemia despite baseline 2 L oxygen -No infiltrates on chest x-ray and with wet sounding productive cough this is likely representative of exacerbation of bronchitis -IV Levaquin -Adjust insulin as needed for hypoxemia -DuoNeb every 2 hours prn and scheduled every 6 hours -Solu-Medrol IV 60 mg every 6 hours -Flutter valve -Continue Spiriva  Active Problems:   UTI   -Asymptomatic -Typically community acquired organisms should respond to above ordered Levaquin -Follow-up on culture    Essential hypertension -Blood pressure controlled -Continue preadmission medications: Cardizem, Lopressor    Diabetes mellitus type 2, controlled  -CBG control -Continue preadmission metformin -Sensitive SSI -Hgb A1c    History of Cerebrovascular accident (CVA) due to embolism of right middle cerebral artery  -Patient had transient slurred speech without focal neurological deficits while urgent care that has improved to baseline -Noncontrasted CT of the head unremarkable    PAF -Currently rate controlled -Continue amiodarone, Cardizem and Lopressor -Continue eliquis-dose is clarified as 5 mg -CHADVASC=7    Anemia -Hemoglobin stable and at baseline    HLD -Continue Lipitor     DVT Prophylaxis: Eliquis  Family Communication:   No family at bedside  Code Status: Full code   Condition:  Stable  Discharge disposition: Anticipate discharge back to home environment pending medical stability-full within the next 48 hours  Time spent in minutes : 60      ELLIS,ALLISON L. ANP on 11/27/2015 at 2:51 PM  You may contact me by going to [email or web address] - password [email or web address]  I am available from 7a-7p but please confirm I am on the schedule by going to Amion as above.   After 7p please contact night coverage person covering me after hours  [facility] I have taken an interval  history, reviewed the chart and examined the patient. I agree with the Advanced Practice Provider's note, impression and recommendations. I have made any necessary editorial changes. 74 year old female with history of CAD, prior embolic CVA, anemia, diabetes mellitus, A. fib, hypertension who came to the hospital with cough and shortness of breath. Patient has home oxygen which she'll utilize at bedtime. She denies chest pain. Chest x-ray shows no infiltrates.  On exam Chest- bilateral rhonchi  Assessment Acute bronchitis UTI Diabetes mellitus  Patient will be started on Levaquin, Solu-Medrol, DuoNeb every 6 hours, flutter valve Follow urine cultures Continue metformin, sliding scale insulin

## 2015-11-27 NOTE — ED Notes (Signed)
Pt returned from CT scan in nad.

## 2015-11-28 ENCOUNTER — Ambulatory Visit: Payer: Medicare Other | Admitting: Acute Care

## 2015-11-28 DIAGNOSIS — J9621 Acute and chronic respiratory failure with hypoxia: Secondary | ICD-10-CM | POA: Diagnosis not present

## 2015-11-28 DIAGNOSIS — N390 Urinary tract infection, site not specified: Secondary | ICD-10-CM | POA: Diagnosis not present

## 2015-11-28 DIAGNOSIS — J441 Chronic obstructive pulmonary disease with (acute) exacerbation: Secondary | ICD-10-CM | POA: Diagnosis not present

## 2015-11-28 LAB — BASIC METABOLIC PANEL
ANION GAP: 11 (ref 5–15)
BUN: 24 mg/dL — ABNORMAL HIGH (ref 6–20)
CHLORIDE: 101 mmol/L (ref 101–111)
CO2: 23 mmol/L (ref 22–32)
Calcium: 8.8 mg/dL — ABNORMAL LOW (ref 8.9–10.3)
Creatinine, Ser: 1.28 mg/dL — ABNORMAL HIGH (ref 0.44–1.00)
GFR calc Af Amer: 47 mL/min — ABNORMAL LOW (ref >60)
GFR calc non Af Amer: 40 mL/min — ABNORMAL LOW (ref >60)
GLUCOSE: 179 mg/dL — AB (ref 65–99)
POTASSIUM: 3.7 mmol/L (ref 3.5–5.1)
Sodium: 135 mmol/L (ref 135–145)

## 2015-11-28 LAB — CBC
HCT: 25.5 % — ABNORMAL LOW (ref 36.0–46.0)
Hemoglobin: 7.9 g/dL — ABNORMAL LOW (ref 12.0–15.0)
MCH: 26.2 pg (ref 26.0–34.0)
MCHC: 31 g/dL (ref 30.0–36.0)
MCV: 84.7 fL (ref 78.0–100.0)
PLATELETS: 172 10*3/uL (ref 150–400)
RBC: 3.01 MIL/uL — AB (ref 3.87–5.11)
RDW: 15.9 % — AB (ref 11.5–15.5)
WBC: 9.4 10*3/uL (ref 4.0–10.5)

## 2015-11-28 LAB — GLUCOSE, CAPILLARY
Glucose-Capillary: 164 mg/dL — ABNORMAL HIGH (ref 65–99)
Glucose-Capillary: 155 mg/dL — ABNORMAL HIGH (ref 65–99)

## 2015-11-28 LAB — HEMOGLOBIN A1C
Hgb A1c MFr Bld: 5.9 % — ABNORMAL HIGH (ref 4.8–5.6)
Mean Plasma Glucose: 123 mg/dL

## 2015-11-28 MED ORDER — PREDNISONE 20 MG PO TABS: 20.0000 mg | ORAL_TABLET | Freq: Every day | ORAL | Status: AC

## 2015-11-28 MED ORDER — GUAIFENESIN-DM 100-10 MG/5ML PO SYRP: 5.0000 mL | ORAL_SOLUTION | ORAL | Status: AC | PRN

## 2015-11-28 MED ORDER — ALBUTEROL SULFATE HFA 108 (90 BASE) MCG/ACT IN AERS: 2.0000 | INHALATION_SPRAY | RESPIRATORY_TRACT | Status: AC | PRN

## 2015-11-28 MED ORDER — LEVOFLOXACIN 500 MG PO TABS: 500.0000 mg | ORAL_TABLET | Freq: Every day | ORAL | Status: AC

## 2015-11-28 NOTE — Discharge Planning (Signed)
Patient discharged home in stable condition. Verbalizes understanding of all discharge instructions, including home medications and follow up appointments. 

## 2015-11-28 NOTE — Progress Notes (Signed)
SATURATION QUALIFICATIONS: (This note is used to comply with regulatory documentation for home oxygen)  Patient Saturations on Room Air at Rest = 91%  Patient Saturations on Room Air while Ambulating = 82%  Patient Saturations on 2 Liters of oxygen while Ambulating = 96%  Please briefly explain why patient needs home oxygen:

## 2015-11-28 NOTE — Progress Notes (Signed)
Patient o2 stat:  Sitting without o2- 97% Sitting with o2-100% Walking without o2-94%

## 2015-11-28 NOTE — Discharge Summary (Addendum)
Physician Discharge Summary  [name] [id number] DOB: May 15, 1941 DOA: 11/27/2015  PCP: Nyoka Cowden, MD  Admit date: 11/27/2015 Discharge date: 11/28/2015  Time spent: 25 minutes  Recommendations for Outpatient Follow-up:  1. Discharge home on oral prednisone taper over the next 12 days. She will complete 5 days of oral Levaquin on 12/01/2015. 2. Follow-up with PCP in 1 week. Patient supposed to see a new pulmonologist today but will reschedule her appointment.     Discharge Diagnoses:  Principal Problem:   COPD exacerbation ([place])  Active Problems:   Anemia   Essential hypertension   Diabetes mellitus type 2, controlled ([place])   UTI (urinary tract infection)   HLD (hyperlipidemia)   Acute on chronic respiratory failure with hypoxia (HCC)   History of Cerebrovascular accident (CVA) due to embolism of right middle cerebral artery (HCC)   PAF (paroxysmal atrial fibrillation) ([place])   Discharge Condition: Fair  Diet recommendation: Heart healthy/diabetic  CODE STATUS: Full code   Filed Weights   11/27/15 1123 11/27/15 1601  Weight: 64.411 kg (142 lb) 65.4 kg (144 lb 2.9 oz)    History of present illness:  Please refer  to admission H&P for details, in brief, 74 year old female with history of coronary artery disease, prior embolic stroke, anemia, dyslipidemia, diabetes mellitus on oral hypoglycemics, personal A. fib on Eliquis, COPD on nocturnal oxygen (2 L) who started having URI symptoms with cough, nasal congestion or shortness of breath since 5 days prior to admission. She reports using oxygen during the day as well. Denied any fevers or chills but did have some malaise. She has chronic cough with yellowish phlegm which was more pronounced for the last few days. Her daughter initially to cover to urgent care where she had an episode of slurred speech lasting for a few seconds without any focal weakness and subsided on its on. In the ED she had a  low-grade fever of 54F. Her O2 sat was stable on 2 L at rest but dropped to 70s on ambulation. Chest x-ray and head CT were unremarkable. ENT was mildly elevated at 540. UA suggestive of UTI. Blood work was otherwise normal. Patient given 80 mg IV Solu-Medrol and 3 rounds of DuoNeb's some improvement. Admitted to hospitalist service.  Hospital Course:  Acute on chronic hypoxic respiratory failure secondary to COPD exacerbation Admitted under observation. Started on IV Solu-Medrol, DuoNeb, empiric Levaquin and antitussives. Flu PCR was negative. Symptoms much improved and patient feels her breathing to beabout the 80% of normal. -Maintaining O2 sat on nasal cannula at rest. o2 saturation dropped to 82 % on ambulation on RA, was 91 % at rest. Will need continuous [place] (2L) on ambulation for now until outpt follow up.  -I will discharge her on oral prednisone taper over next 12 days. She will complete 5 day course of oral Levaquin. Patient reports running out of her albuterol inhaler and pressure provided. Continue Spiriva. -Patient followed with pulmonologist Dr. Asencion Noble and is scheduled to see a new pulmonologist. (Had appointment today but will reschedule as outpatient. -Patient stable to be discharged home with outpatient follow-up.  UTI Asymptomatic. On empiric Levaquin which would cover. Follow urine culture as outpatient.  Coronary artery disease Continue aspirin and statin.  Paroxysmal A. fib and history of embolic stroke Rate controlled. Continue amiodarone, Lopressor and Cardizem. Continue eliquis  Type 2 diabetes mellitus A1c of 5.7. Continue metformin.  Anemia Stable.  Procedures:  CT head  Consultations:  None  Family communication: Daughter at  bedside   Discharge Exam: Filed Vitals:   11/28/15 0649 11/28/15 1347  BP: 177/60 159/48  Pulse: 65 60  Temp: 97.3 F (36.3 C) 97.5 F (36.4 C)  Resp: 18 18    General: Elderly female not in distress HEENT: No  pallor, moist mucosa Chest: Clear to auscultation bilaterally CVS: Normal S1 and S2, no murmurs rub or gallop GI: Soft, nondistended, nontender Musculoskeletal: Warm, no edema  Discharge Instructions    Current Discharge Medication List    START taking these medications   Details  guaiFENesin-dextromethorphan (ROBITUSSIN DM) 100-10 MG/5ML syrup Take 5 mLs by mouth every 4 (four) hours as needed for cough. Qty: 118 mL, Refills: 0    levofloxacin (LEVAQUIN) 500 MG tablet Take 1 tablet (500 mg total) by mouth daily. Qty: 3 tablet, Refills: 0    predniSONE (DELTASONE) 20 MG tablet Take 1 tablet (20 mg total) by mouth daily with breakfast. Qty: 16 tablet, Refills: 0      CONTINUE these medications which have CHANGED   Details  albuterol (PROVENTIL HFA;VENTOLIN HFA) 108 (90 Base) MCG/ACT inhaler Inhale 2 puffs into the lungs every 4 (four) hours as needed for wheezing or shortness of breath. Qty: 5 Inhaler, Refills: 3      CONTINUE these medications which have NOT CHANGED   Details  amiodarone (PACERONE) 200 MG tablet Take 0.5 tablets (100 mg total) by mouth daily. Qty: 90 tablet, Refills: 3   Associated Diagnoses: Paroxysmal atrial fibrillation (HCC); PAF (paroxysmal atrial fibrillation) (HCC)    aspirin EC 81 MG EC tablet Take 1 tablet (81 mg total) by mouth daily. Qty: 30 tablet, Refills: 0    atorvastatin (LIPITOR) 40 MG tablet TAKE 1 TABLET (40 MG TOTAL) BY MOUTH EVERY EVENING. Qty: 90 tablet, Refills: 1    Diclofenac Sodium 1 % CREA Place 1 application onto the skin 2 (two) times daily. Qty: 120 g, Refills: 4    diltiazem (CARDIZEM CD) 120 MG 24 hr capsule TAKE 1 CAPSULE (120 MG TOTAL) BY MOUTH DAILY. Qty: 30 capsule, Refills: 5    ELIQUIS 5 MG TABS tablet TAKE 1 TABLET (5 MG TOTAL) BY MOUTH 2 (TWO) TIMES DAILY. Qty: 60 tablet, Refills: 3    furosemide (LASIX) 20 MG tablet Take 1 tablet (20 mg total) by mouth daily. Qty: 90 tablet, Refills: 3    gabapentin  (NEURONTIN) 600 MG tablet TAKE 1 TABLET (600 MG TOTAL) BY MOUTH 3 (THREE) TIMES DAILY. Qty: 270 tablet, Refills: 1    KLOR-CON 10 10 MEQ tablet TAKE 1 TABLET (10 MEQ TOTAL) BY MOUTH DAILY. Qty: 30 tablet, Refills: 6    LORazepam (ATIVAN) 0.5 MG tablet TAKE ONE TABLET BY MOUTH TWICE A DAY Qty: 60 tablet, Refills: 2    metFORMIN (GLUCOPHAGE) 500 MG tablet TAKE 1 TABLET (500 MG TOTAL) BY MOUTH 2 (TWO) TIMES DAILY WITH A MEAL. Qty: 180 tablet, Refills: 1    metoprolol tartrate (LOPRESSOR) 25 MG tablet Take 1 tablet (25 mg total) by mouth 2 (two) times daily. Qty: 180 tablet, Refills: 3    Multiple Vitamins-Minerals (CENTRUM SILVER ULTRA WOMENS PO) Take 1 capsule by mouth daily.     nitroGLYCERIN (NITROSTAT) 0.4 MG SL tablet Place 0.4 mg under the tongue every 5 (five) minutes as needed for chest pain.    promethazine (PHENERGAN) 25 MG tablet TAKE 1 TABLET BY MOUTH EVERY 6 HOURS AS NEEDED FOR NAUSEA OR VOMITING Qty: 20 tablet, Refills: 1    sertraline (ZOLOFT) 50 MG tablet Take  25 mg by mouth daily.    SPIRIVA HANDIHALER 18 MCG inhalation capsule PLACE 1 CAPSULE (18 MCG TOTAL) INTO INHALER AND INHALE DAILY. Qty: 30 capsule, Refills: 5    triamcinolone cream (KENALOG) 0.1 % Apply 1 application topically daily as needed (affected area).  Refills: 0      STOP taking these medications     HYDROcodone-acetaminophen (NORCO/VICODIN) 5-325 MG per tablet        Allergies  Allergen Reactions  . Codeine Nausea And Vomiting   Follow-up Information    Follow up with Nyoka Cowden, MD. Schedule an appointment as soon as possible for a visit in 1 week.   Specialty:  Internal Medicine   Contact information:   [place] [address] [phone]        The results of significant diagnostics from this hospitalization (including imaging, microbiology, ancillary and laboratory) are listed below for reference.    Significant Diagnostic Studies: Dg Chest 2  View  11/27/2015  CLINICAL DATA:  Shortness of breath. EXAM: CHEST  2 VIEW COMPARISON:  August 18, 2015. FINDINGS: The heart size and mediastinal contours are within normal limits. No pneumothorax or pleural effusion is noted. Status post coronary artery bypass graft. Atherosclerosis of thoracic aorta is noted. Stable bibasilar densities are noted concerning for scarring or subsegmental atelectasis. The visualized skeletal structures are unremarkable. IMPRESSION: Stable bibasilar scarring or subsegmental atelectasis. Electronically Signed   By: Marijo Conception, M.D.   On: 11/27/2015 12:43   Ct Head Wo Contrast  11/27/2015  CLINICAL DATA:  74 year old female with generalized malaise and dry cough for the past 3 days. Shortness breath with exertion. EXAM: CT HEAD WITHOUT CONTRAST TECHNIQUE: Contiguous axial images were obtained from the base of the skull through the vertex without intravenous contrast. COMPARISON:  Head CT 05/29/2014. FINDINGS: Mild cerebral atrophy. Patchy areas of decreased attenuation are noted throughout the deep and periventricular white matter of the cerebral hemispheres bilaterally, compatible with mild chronic microvascular ischemic disease. Small cortical calcifications are again noted in the parietal regions bilaterally. No acute intracranial abnormalities. Specifically, no evidence of acute intracranial hemorrhage, no definite findings of acute/subacute cerebral ischemia, no mass, mass effect, hydrocephalus or abnormal intra or extra-axial fluid collections. Visualized paranasal sinuses and mastoids are well pneumatized. No acute displaced skull fractures are identified. IMPRESSION: 1. No acute intracranial abnormalities. 2. Mild cerebral atrophy and chronic microvascular ischemic changes in cerebral white matter, similar to prior examinations. Electronically Signed   By: Vinnie Langton M.D.   On: 11/27/2015 13:20    Microbiology: Recent Results (from the past 240 hour(s))   Urine culture     Status: None (Preliminary result)   Collection Time: 11/27/15  1:37 PM  Result Value Ref Range Status   Specimen Description URINE, RANDOM  Final   Special Requests NONE  Final   Culture TOO YOUNG TO READ  Final   Report Status PENDING  Incomplete     Labs: Basic Metabolic Panel:  Recent Labs Lab 11/27/15 1140 11/28/15 0600  NA 137 135  K 3.8 3.7  CL 101 101  CO2 25 23  GLUCOSE 109* 179*  BUN 21* 24*  CREATININE 1.41* 1.28*  CALCIUM 8.8* 8.8*   Liver Function Tests: No results for input(s): AST, ALT, ALKPHOS, BILITOT, PROT, ALBUMIN in the last 168 hours. No results for input(s): LIPASE, AMYLASE in the last 168 hours. No results for input(s): AMMONIA in the last 168 hours. CBC:  Recent Labs Lab 11/27/15  1140 11/28/15 0600  WBC 10.7* 9.4  HGB 8.6* 7.9*  HCT 28.5* 25.5*  MCV 85.8 84.7  PLT 175 172   Cardiac Enzymes:  Recent Labs Lab 11/27/15 1140  TROPONINI <0.03   BNP: BNP (last 3 results)  Recent Labs  11/27/15 1140  BNP 540.4*    ProBNP (last 3 results)  Recent Labs  05/21/15 0835  PROBNP 140.0*    CBG:  Recent Labs Lab 11/27/15 1727 11/27/15 2056 11/28/15 0749 11/28/15 1158  GLUCAP 214* 211* 164* 155*       Signed:  Louellen Molder MD    [facility] 11/28/2015, 3:24 PM   94

## 2015-11-28 NOTE — Discharge Instructions (Signed)
Chronic Obstructive Pulmonary Disease Chronic obstructive pulmonary disease (COPD) is a common lung condition in which airflow from the lungs is limited. COPD is a general term that can be used to describe many different lung problems that limit airflow, including both chronic bronchitis and emphysema. If you have COPD, your lung function will probably never return to normal, but there are measures you can take to improve lung function and make yourself feel better. CAUSES   Smoking (common).  Exposure to secondhand smoke.  Genetic problems.  Chronic inflammatory lung diseases or recurrent infections. SYMPTOMS  Shortness of breath, especially with physical activity.  Deep, persistent (chronic) cough with a large amount of thick mucus.  Wheezing.  Rapid breaths (tachypnea).  Gray or bluish discoloration (cyanosis) of the skin, especially in your fingers, toes, or lips.  Fatigue.  Weight loss.  Frequent infections or episodes when breathing symptoms become much worse (exacerbations).  Chest tightness. DIAGNOSIS Your health care provider will take a medical history and perform a physical examination to diagnose COPD. Additional tests for COPD may include:  Lung (pulmonary) function tests.  Chest X-ray.  CT scan.  Blood tests. TREATMENT  Treatment for COPD may include:  Inhaler and nebulizer medicines. These help manage the symptoms of COPD and make your breathing more comfortable.  Supplemental oxygen. Supplemental oxygen is only helpful if you have a low oxygen level in your blood.  Exercise and physical activity. These are beneficial for nearly all people with COPD.  Lung surgery or transplant.  Nutrition therapy to gain weight, if you are underweight.  Pulmonary rehabilitation. This may involve working with a team of health care providers and specialists, such as respiratory, occupational, and physical therapists. HOME CARE INSTRUCTIONS  Take all medicines  (inhaled or pills) as directed by your health care provider.  Avoid over-the-counter medicines or cough syrups that dry up your airway (such as antihistamines) and slow down the elimination of secretions unless instructed otherwise by your health care provider.  If you are a smoker, the most important thing that you can do is stop smoking. Continuing to smoke will cause further lung damage and breathing trouble. Ask your health care provider for help with quitting smoking. He or she can direct you to community resources or hospitals that provide support.  Avoid exposure to irritants such as smoke, chemicals, and fumes that aggravate your breathing.  Use oxygen therapy and pulmonary rehabilitation if directed by your health care provider. If you require home oxygen therapy, ask your health care provider whether you should purchase a pulse oximeter to measure your oxygen level at home.  Avoid contact with individuals who have a contagious illness.  Avoid extreme temperature and humidity changes.  Eat healthy foods. Eating smaller, more frequent meals and resting before meals may help you maintain your strength.  Stay active, but balance activity with periods of rest. Exercise and physical activity will help you maintain your ability to do things you want to do.  Preventing infection and hospitalization is very important when you have COPD. Make sure to receive all the vaccines your health care provider recommends, especially the pneumococcal and influenza vaccines. Ask your health care provider whether you need a pneumonia vaccine.  Learn and use relaxation techniques to manage stress.  Learn and use controlled breathing techniques as directed by your health care provider. Controlled breathing techniques include:  Pursed lip breathing. Start by breathing in (inhaling) through your nose for 1 second. Then, purse your lips as if you were   going to whistle and breathe out (exhale) through the  pursed lips for 2 seconds.  Diaphragmatic breathing. Start by putting one hand on your abdomen just above your waist. Inhale slowly through your nose. The hand on your abdomen should move out. Then purse your lips and exhale slowly. You should be able to feel the hand on your abdomen moving in as you exhale.  Learn and use controlled coughing to clear mucus from your lungs. Controlled coughing is a series of short, progressive coughs. The steps of controlled coughing are: 1. Lean your head slightly forward. 2. Breathe in deeply using diaphragmatic breathing. 3. Try to hold your breath for 3 seconds. 4. Keep your mouth slightly open while coughing twice. 5. Spit any mucus out into a tissue. 6. Rest and repeat the steps once or twice as needed. SEEK MEDICAL CARE IF:  You are coughing up more mucus than usual.  There is a change in the color or thickness of your mucus.  Your breathing is more labored than usual.  Your breathing is faster than usual. SEEK IMMEDIATE MEDICAL CARE IF:  You have shortness of breath while you are resting.  You have shortness of breath that prevents you from:  Being able to talk.  Performing your usual physical activities.  You have chest pain lasting longer than 5 minutes.  Your skin color is more cyanotic than usual.  You measure low oxygen saturations for longer than 5 minutes with a pulse oximeter. MAKE SURE YOU:  Understand these instructions.  Will watch your condition.  Will get help right away if you are not doing well or get worse.   This information is not intended to replace advice given to you by your health care provider. Make sure you discuss any questions you have with your health care provider.   Document Released: 08/25/2005 Document Revised: 12/06/2014 Document Reviewed: 07/12/2013 Elsevier Interactive Patient Education 2016 [employer]  

## 2015-11-28 NOTE — Progress Notes (Addendum)
Nutrition Brief Note  RD consulted to assess nutrient status/ needs.   Wt Readings from Last 15 Encounters:  11/27/15 144 lb 2.9 oz (65.4 kg)  11/25/15 146 lb 3.2 oz (66.316 kg)  11/17/15 145 lb (65.772 kg)  11/17/15 145 lb (65.772 kg)  10/29/15 145 lb 3.2 oz (65.862 kg)  10/21/15 141 lb 3.2 oz (64.048 kg)  09/29/15 145 lb (65.772 kg)  08/19/15 144 lb 3.2 oz (65.409 kg)  08/18/15 142 lb (64.411 kg)  05/21/15 142 lb 6.4 oz (64.592 kg)  04/29/15 146 lb (66.225 kg)  04/02/15 146 lb (66.225 kg)  02/18/15 141 lb 12.8 oz (64.32 kg)  02/12/15 143 lb 6.4 oz (65.046 kg)  12/11/14 138 lb (62.79 kg)   74 year old female patient history of CAD, prior embolic CVA, anemia, dyslipidemia, diabetes on oral hypoglycemic agents, paroxysmal atrial fibrillation on eliquis, hypertension and COPD. beginning this past Sunday patient began feeling poorly and having increasing cough and shortness of breath. She typically utilizes oxygen only at bedtime but began to utilize it more so during the day over the past 3 days.  Pt admitted with acute on chronic respiratory failure with hypoxia /COPD exacerbation/known chronic bronchitis.  Spoke with RN who reports pt with fair appetite.   Spoke with pt who describes herself as very active and independent PTA. 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 She reports good appetite; she consumed 50-75% of her breakfast per her report. PTA, she was consuming 3 meals per day (Breakfast: cereal, Lunch/Dinner: chef salad).   She reports UBW is around 145#. She denies any weight loss, which is consistent with documented wt hx.   Nutrition-Focused physical exam completed. Findings are no fat depletion, no muscle depletion, and no edema.   Body mass index is 26.36 kg/(m^2). Patient meets criteria for overweight based on current BMI.   Current diet order is Heart Healthy/ Carb Modified, patient is consuming  approximately 50-75% of meals at this time. Labs and medications reviewed.   No nutrition interventions warranted at this time. If nutrition issues arise, please consult RD.   Ivis Henneman A. Jimmye Norman, RD, LDN, CDE Pager: [phone] After hours Pager: [phone]

## 2015-11-29 LAB — RESPIRATORY VIRUS PANEL
Adenovirus: NEGATIVE
Influenza A: NEGATIVE
Influenza B: NEGATIVE
METAPNEUMOVIRUS: NEGATIVE
PARAINFLUENZA 1 A: NEGATIVE
PARAINFLUENZA 2 A: NEGATIVE
Parainfluenza 3: NEGATIVE
RHINOVIRUS: NEGATIVE
Respiratory Syncytial Virus A: POSITIVE — AB
Respiratory Syncytial Virus B: NEGATIVE

## 2015-11-30 LAB — URINE CULTURE

## 2015-12-04 ENCOUNTER — Ambulatory Visit (INDEPENDENT_AMBULATORY_CARE_PROVIDER_SITE_OTHER): Payer: Medicare Other | Admitting: Adult Health

## 2015-12-04 ENCOUNTER — Encounter: Payer: Self-pay | Admitting: Adult Health

## 2015-12-04 VITALS — BP 142/58 | HR 58 | Temp 97.5°F | Ht 62.0 in | Wt 141.0 lb

## 2015-12-04 DIAGNOSIS — J9611 Chronic respiratory failure with hypoxia: Secondary | ICD-10-CM

## 2015-12-04 DIAGNOSIS — J9610 Chronic respiratory failure, unspecified whether with hypoxia or hypercapnia: Secondary | ICD-10-CM | POA: Insufficient documentation

## 2015-12-04 DIAGNOSIS — J441 Chronic obstructive pulmonary disease with (acute) exacerbation: Secondary | ICD-10-CM

## 2015-12-04 NOTE — Progress Notes (Signed)
Subjective:    Patient ID: [name], female    DOB: 07/31/1941, 75 y.o.   MRN: [id number]  HPI  75 yo GOLD B COPD (FEV1 73%) female former smoker   12/04/2015 Post hospital follow up  : COPD and O2 At bedtime   Pt returns for a post hospital follow-up. Patient was admitted December 29 to December 30 for COPD exacerbation. She was treated with IV antibiotics, steroids, and nebulized bronchodilators. She was discharged on Levaquin and a prednisone taper. She has a few days left of the Levaquin and prednisone. Not taking spiriva daily , discussed taking everyday.  She is feeling better , cough and congestion are better.   She is on O2 At bedtime at 2l/m .  Using Oxygen 2l/m with activity .  Walk in office on room air today with no destas walking on room air.   She follows with cards for Atrial Fib and CAD , on amiodarone /eliquis and cardizem .  She has grade 2 DD , severe LA dilation and PAP at 44, EF 50-55%.    Past Medical History  Diagnosis Date  . ANEMIA 08/28/2010  . CAD (coronary artery disease)     a. s/p CABGx4 in 2001.  . Adenomatous colon polyp 2000  . COPD (chronic obstructive pulmonary disease) ([place])   . Depressive disorder   . GERD 12/19/2009  . Hyperlipidemia   . Hypertension   . HYPOTENSION 08/11/2010  . PVD (peripheral vascular disease) ([place])     a. Duplex 03/2013: stable moderate carotid dz - 123456 RICA, [id number] LICA, L subclavian artery to CCA stent widely patent.  b. Prior R fempop bypass graft, R CIA stent.  . Lung abscess ([place]) 2011  . CHF (congestive heart failure) ([place])   . Diverticulosis   . Aortic stenosis     a. Mild by echo 01/2013.  Marland Kitchen Ringing in ears   . Anxiety   . Joint pain   . On home oxygen therapy     "down to 2L just at night lately" (11/27/2015)  . Atrial fibrillation ([place])   . DVT (deep venous thrombosis) ([facility]) X 1    RLE  . Type II diabetes mellitus ([place])   . History of blood transfusion 2011; 08/2014    "related to abscess on  my lung OR; internal bleeding, had dark stools"  . Stroke [facility]) ~ 01/2014    "TIA"  . Hiatal hernia     a. Dx 01/2014, placed on Xarelto.  . Osteoarthritis     Lumbar stenosis  . Arthritis     "hands maily; arms too" (11/27/2015)   Current Outpatient Prescriptions on File Prior to Visit  Medication Sig Dispense Refill  . albuterol (PROVENTIL HFA;VENTOLIN HFA) 108 (90 Base) MCG/ACT inhaler Inhale 2 puffs into the lungs every 4 (four) hours as needed for wheezing or shortness of breath. 5 Inhaler 3  . amiodarone (PACERONE) 200 MG tablet Take 0.5 tablets (100 mg total) by mouth daily. 90 tablet 3  . aspirin EC 81 MG EC tablet Take 1 tablet (81 mg total) by mouth daily. 30 tablet 0  . atorvastatin (LIPITOR) 40 MG tablet TAKE 1 TABLET (40 MG TOTAL) BY MOUTH EVERY EVENING. 90 tablet 1  . Diclofenac Sodium 1 % CREA Place 1 application onto the skin 2 (two) times daily. (Patient taking differently: Place 1 application onto the skin 2 (two) times daily as needed (pain). ) 120 g 4  . diltiazem (CARDIZEM CD) 120 MG  24 hr capsule TAKE 1 CAPSULE (120 MG TOTAL) BY MOUTH DAILY. 30 capsule 5  . ELIQUIS 5 MG TABS tablet TAKE 1 TABLET (5 MG TOTAL) BY MOUTH 2 (TWO) TIMES DAILY. 60 tablet 3  . furosemide (LASIX) 20 MG tablet Take 1 tablet (20 mg total) by mouth daily. 90 tablet 3  . gabapentin (NEURONTIN) 600 MG tablet TAKE 1 TABLET (600 MG TOTAL) BY MOUTH 3 (THREE) TIMES DAILY. 270 tablet 1  . guaiFENesin-dextromethorphan (ROBITUSSIN DM) 100-10 MG/5ML syrup Take 5 mLs by mouth every 4 (four) hours as needed for cough. 118 mL 0  . KLOR-CON 10 10 MEQ tablet TAKE 1 TABLET (10 MEQ TOTAL) BY MOUTH DAILY. 30 tablet 6  . LORazepam (ATIVAN) 0.5 MG tablet TAKE ONE TABLET BY MOUTH TWICE A DAY (Patient taking differently: TAKE ONE TABLET BY MOUTH TWICE A DAY AS NEEDED FOR ANXIETY) 60 tablet 2  . metFORMIN (GLUCOPHAGE) 500 MG tablet TAKE 1 TABLET (500 MG TOTAL) BY MOUTH 2 (TWO) TIMES DAILY WITH A MEAL. 180 tablet 1  .  metoprolol tartrate (LOPRESSOR) 25 MG tablet Take 1 tablet (25 mg total) by mouth 2 (two) times daily. 180 tablet 3  . Multiple Vitamins-Minerals (CENTRUM SILVER ULTRA WOMENS PO) Take 1 capsule by mouth daily.     . nitroGLYCERIN (NITROSTAT) 0.4 MG SL tablet Place 0.4 mg under the tongue every 5 (five) minutes as needed for chest pain.    . predniSONE (DELTASONE) 20 MG tablet Take 1 tablet (20 mg total) by mouth daily with breakfast. 16 tablet 0  . promethazine (PHENERGAN) 25 MG tablet TAKE 1 TABLET BY MOUTH EVERY 6 HOURS AS NEEDED FOR NAUSEA OR VOMITING 20 tablet 1  . sertraline (ZOLOFT) 50 MG tablet Take 25 mg by mouth daily.    Marland Kitchen SPIRIVA HANDIHALER 18 MCG inhalation capsule PLACE 1 CAPSULE (18 MCG TOTAL) INTO INHALER AND INHALE DAILY. 30 capsule 5  . triamcinolone cream (KENALOG) 0.1 % Apply 1 application topically daily as needed (affected area).   0   No current facility-administered medications on file prior to visit.      Review of Systems  Constitutional:   No  weight loss, night sweats,  Fevers, chills,  +fatigue, or  lassitude.  HEENT:   No headaches,  Difficulty swallowing,  Tooth/dental problems, or  Sore throat,                No sneezing, itching, ear ache, nasal congestion, post nasal drip,   CV:  No chest pain,  Orthopnea, PND, swelling in lower extremities, anasarca, dizziness, palpitations, syncope.   GI  No heartburn, indigestion, abdominal pain, nausea, vomiting, diarrhea, change in bowel habits, loss of appetite, bloody stools.   Resp:    No excess mucus, no productive cough,  No non-productive cough,  No coughing up of blood.  No change in color of mucus.  No wheezing.  No chest wall deformity  Skin: no rash or lesions.  GU: no dysuria, change in color of urine, no urgency or frequency.  No flank pain, no hematuria   MS:  No joint pain or swelling.  No decreased range of motion.  No back pain.  Psych:  No change in mood or affect. No depression or anxiety.  No  memory loss.         Objective:   Physical Exam  GEN: A/Ox3; pleasant , NAD elderly   HEENT:  [place]/AT,  EACs-clear, TMs-wnl, NOSE-clear, THROAT-clear, no lesions, no postnasal drip or exudate  noted.   NECK:  Supple w/ fair ROM; no JVD; normal carotid impulses w/o bruits; no thyromegaly or nodules palpated; no lymphadenopathy.  RESP  Decreased BS in bases .no accessory muscle use, no dullness to percussion  CARD:  RRR, no m/r/g  , no peripheral edema, pulses intact, no cyanosis or clubbing.  GI:   Soft & nt; nml bowel sounds; no organomegaly or masses detected.  Musco: Warm bil, no deformities or joint swelling noted.   Neuro: alert, no focal deficits noted.    Skin: Warm, no lesions or rashes         Assessment & Plan:

## 2015-12-04 NOTE — Assessment & Plan Note (Signed)
Recent flare now resolving .   Plan  Finished Levaquin and prednisone as directed Continue on oxygen 2lm/ At bedtime.  Take Spiriva daily  Mucinex DM Twice daily  As needed  Cough/congestion  Follow up with Dr. Ashok Cordia in 2 months and As needed  Please contact office for sooner follow up if symptoms do not improve or worsen or seek emergency care

## 2015-12-04 NOTE — Assessment & Plan Note (Signed)
No desats with walking in office on RA.  Cont on o2 At bedtime

## 2015-12-04 NOTE — Patient Instructions (Addendum)
Finished Levaquin and prednisone as directed Continue on oxygen 2lm/ At bedtime.  Take Spiriva daily  Mucinex DM Twice daily  As needed  Cough/congestion  Follow up with Dr. Ashok Cordia in 2 months and As needed  Please contact office for sooner follow up if symptoms do not improve or worsen or seek emergency care

## 2015-12-12 ENCOUNTER — Ambulatory Visit (INDEPENDENT_AMBULATORY_CARE_PROVIDER_SITE_OTHER): Payer: Medicare Other | Admitting: Internal Medicine

## 2015-12-12 ENCOUNTER — Encounter: Payer: Self-pay | Admitting: Internal Medicine

## 2015-12-12 VITALS — BP 164/60 | HR 57 | Temp 98.6°F | Resp 20 | Ht 62.0 in | Wt 140.0 lb

## 2015-12-12 DIAGNOSIS — J449 Chronic obstructive pulmonary disease, unspecified: Secondary | ICD-10-CM

## 2015-12-12 DIAGNOSIS — I779 Disorder of arteries and arterioles, unspecified: Secondary | ICD-10-CM | POA: Diagnosis not present

## 2015-12-12 DIAGNOSIS — I10 Essential (primary) hypertension: Secondary | ICD-10-CM | POA: Diagnosis not present

## 2015-12-12 DIAGNOSIS — I739 Peripheral vascular disease, unspecified: Secondary | ICD-10-CM

## 2015-12-12 DIAGNOSIS — J9611 Chronic respiratory failure with hypoxia: Secondary | ICD-10-CM

## 2015-12-12 NOTE — Progress Notes (Signed)
Subjective:    Patient ID: [name], female    DOB: 11/04/1941, 75 y.o.   MRN: [id number]  HPI  75 year old patient who has multiple medical issues including coronary artery disease, peripheral vascular disease. She has type 2 diabetes.  Hemoglobin A1c was checked in the ED last month She has essential hypertension She is followed by pulmonary medicine for COPD and chronic respiratory failure.  She has been on oxygen therapy intermittently  She is seen today after a fall last night, sustaining left chest wall trauma  Past Medical History  Diagnosis Date  . ANEMIA 08/28/2010  . CAD (coronary artery disease)     a. s/p CABGx4 in 2001.  . Adenomatous colon polyp 2000  . COPD (chronic obstructive pulmonary disease) ([place])   . Depressive disorder   . GERD 12/19/2009  . Hyperlipidemia   . Hypertension   . HYPOTENSION 08/11/2010  . PVD (peripheral vascular disease) ([place])     a. Duplex 03/2013: stable moderate carotid dz - 123456 RICA, [id number] LICA, L subclavian artery to CCA stent widely patent.  b. Prior R fempop bypass graft, R CIA stent.  . Lung abscess ([place]) 2011  . CHF (congestive heart failure) ([place])   . Diverticulosis   . Aortic stenosis     a. Mild by echo 01/2013.  Marland Kitchen Ringing in ears   . Anxiety   . Joint pain   . On home oxygen therapy     "down to 2L just at night lately" (11/27/2015)  . Atrial fibrillation ([place])   . DVT (deep venous thrombosis) ([facility]) X 1    RLE  . Type II diabetes mellitus ([place])   . History of blood transfusion 2011; 08/2014    "related to abscess on my lung OR; internal bleeding, had dark stools"  . Stroke [facility]) ~ 01/2014    "TIA"  . Hiatal hernia     a. Dx 01/2014, placed on Xarelto.  . Osteoarthritis     Lumbar stenosis  . Arthritis     "hands maily; arms too" (11/27/2015)    Social History   Social History  . Marital Status: Widowed    Spouse Name: N/A  . Number of Children: 2  . Years of Education: 12   Occupational History  .  Receptionist    Social History Main Topics  . Smoking status: Former Smoker -- 1.50 packs/day for 40 years    Types: Cigarettes    Quit date: 06/29/2000  . Smokeless tobacco: Never Used  . Alcohol Use: No  . Drug Use: No  . Sexual Activity: Not on file   Other Topics Concern  . Not on file   Social History Narrative   Patient is widowed with 2 children.   Patient is right handed.   Patient has hs education.   Patient drinks 2 cups daily.    Past Surgical History  Procedure Laterality Date  . Subclavian bypass graft Left 07/2004  . Tubal ligation    . Coronary artery bypass graft  05/2000    LIMA-LAD, SVG-OM, SVG-PDA-PL  . Femoral-popliteal bypass graft Right 04/1999  . Angioplasty / stenting iliac Right 07/2000    common iliac PTA with stent placement  . Right leg blockage Right 2003  . Aorta - bilateral femoral artery bypass graft  2004  . Carotid endarterectomy Left 2005  . Lung biopsy  2011    cyst  . Esophagogastroduodenoscopy N/A 08/03/2014    Procedure: ESOPHAGOGASTRODUODENOSCOPY (EGD);  Surgeon: Juanita Craver,  MD;  Location: [facility];  Service: Endoscopy;  Laterality: N/A;  . Colonoscopy Left 08/05/2014    Procedure: COLONOSCOPY;  Surgeon: Juanita Craver, MD;  Location: [facility];  Service: Endoscopy;  Laterality: Left;  Freda Munro capsule study N/A 08/06/2014    Procedure: GIVENS CAPSULE STUDY;  Surgeon: Irene Shipper, MD;  Location: [facility];  Service: Endoscopy;  Laterality: N/A;  . Left heart catheterization with coronary/graft angiogram  03/11/2014    Procedure: LEFT HEART CATHETERIZATION WITH Beatrix Fetters;  Surgeon: Peter M Martinique, MD;  Location: [facility] CATH LAB;  Service: Cardiovascular;;  . Cataract extraction, bilateral Bilateral ~ 2008    Family History  Problem Relation Age of Onset  . Diabetes Father   . Heart attack Mother   . Diabetes    . Coronary artery disease    . Colon cancer Neg Hx     Allergies  Allergen Reactions  . Codeine Nausea  And Vomiting    Current Outpatient Prescriptions on File Prior to Visit  Medication Sig Dispense Refill  . albuterol (PROVENTIL HFA;VENTOLIN HFA) 108 (90 Base) MCG/ACT inhaler Inhale 2 puffs into the lungs every 4 (four) hours as needed for wheezing or shortness of breath. 5 Inhaler 3  . amiodarone (PACERONE) 200 MG tablet Take 0.5 tablets (100 mg total) by mouth daily. 90 tablet 3  . aspirin EC 81 MG EC tablet Take 1 tablet (81 mg total) by mouth daily. 30 tablet 0  . atorvastatin (LIPITOR) 40 MG tablet TAKE 1 TABLET (40 MG TOTAL) BY MOUTH EVERY EVENING. 90 tablet 1  . Diclofenac Sodium 1 % CREA Place 1 application onto the skin 2 (two) times daily. (Patient taking differently: Place 1 application onto the skin 2 (two) times daily as needed (pain). ) 120 g 4  . diltiazem (CARDIZEM CD) 120 MG 24 hr capsule TAKE 1 CAPSULE (120 MG TOTAL) BY MOUTH DAILY. 30 capsule 5  . ELIQUIS 5 MG TABS tablet TAKE 1 TABLET (5 MG TOTAL) BY MOUTH 2 (TWO) TIMES DAILY. 60 tablet 3  . furosemide (LASIX) 20 MG tablet Take 1 tablet (20 mg total) by mouth daily. 90 tablet 3  . gabapentin (NEURONTIN) 600 MG tablet TAKE 1 TABLET (600 MG TOTAL) BY MOUTH 3 (THREE) TIMES DAILY. 270 tablet 1  . guaiFENesin (ROBITUSSIN) 100 MG/5ML liquid Take 200 mg by mouth 3 (three) times daily as needed for cough.    Marland Kitchen guaiFENesin-dextromethorphan (ROBITUSSIN DM) 100-10 MG/5ML syrup Take 5 mLs by mouth every 4 (four) hours as needed for cough. 118 mL 0  . KLOR-CON 10 10 MEQ tablet TAKE 1 TABLET (10 MEQ TOTAL) BY MOUTH DAILY. 30 tablet 6  . levofloxacin (LEVAQUIN) 500 MG tablet Take 500 mg by mouth daily.    Marland Kitchen LORazepam (ATIVAN) 0.5 MG tablet TAKE ONE TABLET BY MOUTH TWICE A DAY (Patient taking differently: TAKE ONE TABLET BY MOUTH TWICE A DAY AS NEEDED FOR ANXIETY) 60 tablet 2  . metFORMIN (GLUCOPHAGE) 500 MG tablet TAKE 1 TABLET (500 MG TOTAL) BY MOUTH 2 (TWO) TIMES DAILY WITH A MEAL. 180 tablet 1  . metoprolol tartrate (LOPRESSOR) 25 MG  tablet Take 1 tablet (25 mg total) by mouth 2 (two) times daily. 180 tablet 3  . Multiple Vitamins-Minerals (CENTRUM SILVER ULTRA WOMENS PO) Take 1 capsule by mouth daily.     . nitroGLYCERIN (NITROSTAT) 0.4 MG SL tablet Place 0.4 mg under the tongue every 5 (five) minutes as needed for chest pain.    . predniSONE (  DELTASONE) 20 MG tablet Take 1 tablet (20 mg total) by mouth daily with breakfast. 16 tablet 0  . promethazine (PHENERGAN) 25 MG tablet TAKE 1 TABLET BY MOUTH EVERY 6 HOURS AS NEEDED FOR NAUSEA OR VOMITING 20 tablet 1  . sertraline (ZOLOFT) 50 MG tablet Take 25 mg by mouth daily.    Marland Kitchen SPIRIVA HANDIHALER 18 MCG inhalation capsule PLACE 1 CAPSULE (18 MCG TOTAL) INTO INHALER AND INHALE DAILY. 30 capsule 5  . triamcinolone cream (KENALOG) 0.1 % Apply 1 application topically daily as needed (affected area).   0   No current facility-administered medications on file prior to visit.    BP 164/60 mmHg  Pulse 57  Temp(Src) 98.6 F (37 C) (Oral)  Resp 20  Ht 5\' 2"  (1.575 m)  Wt 140 lb (63.504 kg)  BMI 25.60 kg/m2  SpO2 97%     Review of Systems  Constitutional: Negative.   HENT: Negative for congestion, dental problem, hearing loss, rhinorrhea, sinus pressure, sore throat and tinnitus.   Eyes: Negative for pain, discharge and visual disturbance.  Respiratory: Negative for cough and shortness of breath.   Cardiovascular: Positive for chest pain. Negative for palpitations and leg swelling.  Gastrointestinal: Negative for nausea, vomiting, abdominal pain, diarrhea, constipation, blood in stool and abdominal distention.  Genitourinary: Negative for dysuria, urgency, frequency, hematuria, flank pain, vaginal bleeding, vaginal discharge, difficulty urinating, vaginal pain and pelvic pain.  Musculoskeletal: Negative for joint swelling, arthralgias and gait problem.  Skin: Negative for rash.  Neurological: Negative for dizziness, syncope, speech difficulty, weakness, numbness and  headaches.  Hematological: Negative for adenopathy.  Psychiatric/Behavioral: Negative for behavioral problems, dysphoric mood and agitation. The patient is not nervous/anxious.        Objective:   Physical Exam  Constitutional: She is oriented to person, place, and time. She appears well-developed and well-nourished.   Blood pressure 160/60 on the left Blood pressure 105/60 on the right  HENT:  Head: Normocephalic.  Right Ear: External ear normal.  Left Ear: External ear normal.  Mouth/Throat: Oropharynx is clear and moist.  Eyes: Conjunctivae and EOM are normal. Pupils are equal, round, and reactive to light.  Neck: Normal range of motion. Neck supple. No thyromegaly present.  Cardiovascular: Normal rate, regular rhythm, normal heart sounds and intact distal pulses.   Pulmonary/Chest: Effort normal and breath sounds normal. She exhibits tenderness.  Very mild left anterolateral chest wall tenderness Clear Generally diminished breath sounds but equal bilaterally  Abdominal: Soft. Bowel sounds are normal. She exhibits no mass. There is no tenderness.  Musculoskeletal: Normal range of motion.  Lymphadenopathy:    She has no cervical adenopathy.  Neurological: She is alert and oriented to person, place, and time.  Skin: Skin is warm and dry. No rash noted.  Resolving ecchymoses left lateral chest wall  Psychiatric: She has a normal mood and affect. Her behavior is normal.          Assessment & Plan:   Left chest wall contusion Coronary artery disease Essential hypertension COPD  Continue symptomatic treatment  Follow-up as scheduled later this spring

## 2015-12-12 NOTE — Patient Instructions (Signed)
Limit your sodium (Salt) intake  Call or return to clinic prn if these symptoms worsen or fail to improve as anticipated.   

## 2015-12-16 ENCOUNTER — Other Ambulatory Visit: Payer: Self-pay | Admitting: Internal Medicine

## 2015-12-19 ENCOUNTER — Other Ambulatory Visit: Payer: Self-pay | Admitting: Cardiology

## 2015-12-23 ENCOUNTER — Other Ambulatory Visit: Payer: Self-pay | Admitting: Cardiology

## 2015-12-26 ENCOUNTER — Other Ambulatory Visit: Payer: Self-pay | Admitting: Internal Medicine

## 2016-01-08 ENCOUNTER — Other Ambulatory Visit: Payer: Self-pay | Admitting: Cardiology

## 2016-01-13 ENCOUNTER — Encounter: Payer: Self-pay | Admitting: Internal Medicine

## 2016-01-13 ENCOUNTER — Ambulatory Visit (INDEPENDENT_AMBULATORY_CARE_PROVIDER_SITE_OTHER): Payer: Medicare Other | Admitting: Internal Medicine

## 2016-01-13 VITALS — BP 118/56 | HR 55 | Temp 98.4°F | Resp 20 | Ht 62.0 in | Wt 150.0 lb

## 2016-01-13 DIAGNOSIS — I779 Disorder of arteries and arterioles, unspecified: Secondary | ICD-10-CM | POA: Diagnosis not present

## 2016-01-13 DIAGNOSIS — E1121 Type 2 diabetes mellitus with diabetic nephropathy: Secondary | ICD-10-CM

## 2016-01-13 DIAGNOSIS — J4489 Other specified chronic obstructive pulmonary disease: Secondary | ICD-10-CM

## 2016-01-13 DIAGNOSIS — I10 Essential (primary) hypertension: Secondary | ICD-10-CM

## 2016-01-13 DIAGNOSIS — I350 Nonrheumatic aortic (valve) stenosis: Secondary | ICD-10-CM | POA: Diagnosis not present

## 2016-01-13 DIAGNOSIS — I739 Peripheral vascular disease, unspecified: Principal | ICD-10-CM

## 2016-01-13 DIAGNOSIS — J449 Chronic obstructive pulmonary disease, unspecified: Secondary | ICD-10-CM | POA: Diagnosis not present

## 2016-01-13 DIAGNOSIS — J069 Acute upper respiratory infection, unspecified: Secondary | ICD-10-CM

## 2016-01-13 NOTE — Progress Notes (Signed)
Pre visit review using our clinic review tool, if applicable. No additional management support is needed unless otherwise documented below in the visit note. 

## 2016-01-13 NOTE — Progress Notes (Signed)
Subjective:    Patient ID: [name], female    DOB: [birth year], 75 y.o.   MRN: [id number]  HPI 75 year old patient who has multiple medical problems including type 2 diabetes, COPD, coronary artery disease, aortic stenosis and peripheral vascular disease.  She presents with a 2-3 day history of weakness, fatigue, body aches.  No fever, productive cough or shortness of breath.  There has been weight gain over the past month.  But no peripheral edema.  No wheezing  Past Medical History  Diagnosis Date  . ANEMIA 08/28/2010  . CAD (coronary artery disease)     a. s/p CABGx4 in 2001.  . Adenomatous colon polyp 2000  . COPD (chronic obstructive pulmonary disease) ([place])   . Depressive disorder   . GERD 12/19/2009  . Hyperlipidemia   . Hypertension   . HYPOTENSION 08/11/2010  . PVD (peripheral vascular disease) ([facility])     a. Duplex 03/2013: stable moderate carotid dz - 123456 RICA, [id number] LICA, L subclavian artery to CCA stent widely patent.  b. Prior R fempop bypass graft, R CIA stent.  . Lung abscess ([place]) 2011  . CHF (congestive heart failure) ([place])   . Diverticulosis   . Aortic stenosis     a. Mild by echo 01/2013.  Marland Kitchen Ringing in ears   . Anxiety   . Joint pain   . On home oxygen therapy     "down to 2L just at night lately" (11/27/2015)  . Atrial fibrillation ([place])   . DVT (deep venous thrombosis) ([facility]) X 1    RLE  . Type II diabetes mellitus ([place])   . History of blood transfusion 2011; 08/2014    "related to abscess on my lung OR; internal bleeding, had dark stools"  . Stroke [facility]) ~ 01/2014    "TIA"  . Hiatal hernia     a. Dx 01/2014, placed on Xarelto.  . Osteoarthritis     Lumbar stenosis  . Arthritis     "hands maily; arms too" (11/27/2015)    Social History   Social History  . Marital Status: Widowed    Spouse Name: N/A  . Number of Children: 2  . Years of Education: 12   Occupational History  . Receptionist    Social History Main Topics  . Smoking  status: Former Smoker -- 1.50 packs/day for 40 years    Types: Cigarettes    Quit date: 06/29/2000  . Smokeless tobacco: Never Used  . Alcohol Use: No  . Drug Use: No  . Sexual Activity: Not on file   Other Topics Concern  . Not on file   Social History Narrative   Patient is widowed with 2 children.   Patient is right handed.   Patient has hs education.   Patient drinks 2 cups daily.    Past Surgical History  Procedure Laterality Date  . Subclavian bypass graft Left 07/2004  . Tubal ligation    . Coronary artery bypass graft  05/2000    LIMA-LAD, SVG-OM, SVG-PDA-PL  . Femoral-popliteal bypass graft Right 04/1999  . Angioplasty / stenting iliac Right 07/2000    common iliac PTA with stent placement  . Right leg blockage Right 2003  . Aorta - bilateral femoral artery bypass graft  2004  . Carotid endarterectomy Left 2005  . Lung biopsy  2011    cyst  . Esophagogastroduodenoscopy N/A 08/03/2014    Procedure: ESOPHAGOGASTRODUODENOSCOPY (EGD);  Surgeon: Juanita Craver, MD;  Location: [facility] ENDOSCOPY;  Service: Endoscopy;  Laterality: N/A;  . Colonoscopy Left 08/05/2014    Procedure: COLONOSCOPY;  Surgeon: Juanita Craver, MD;  Location: [place];  Service: Endoscopy;  Laterality: Left;  Freda Munro capsule study N/A 08/06/2014    Procedure: GIVENS CAPSULE STUDY;  Surgeon: Irene Shipper, MD;  Location: [place];  Service: Endoscopy;  Laterality: N/A;  . Left heart catheterization with coronary/graft angiogram  03/11/2014    Procedure: LEFT HEART CATHETERIZATION WITH Beatrix Fetters;  Surgeon: Glennda Weatherholtz M Martinique, MD;  Location: [facility] CATH LAB;  Service: Cardiovascular;;  . Cataract extraction, bilateral Bilateral ~ 2008    Family History  Problem Relation Age of Onset  . Diabetes Father   . Heart attack Mother   . Diabetes    . Coronary artery disease    . Colon cancer Neg Hx     Allergies  Allergen Reactions  . Codeine Nausea And Vomiting    Current Outpatient Prescriptions on  File Prior to Visit  Medication Sig Dispense Refill  . albuterol (PROVENTIL HFA;VENTOLIN HFA) 108 (90 Base) MCG/ACT inhaler Inhale 2 puffs into the lungs every 4 (four) hours as needed for wheezing or shortness of breath. 5 Inhaler 3  . amiodarone (PACERONE) 200 MG tablet Take 0.5 tablets (100 mg total) by mouth daily. 90 tablet 3  . aspirin EC 81 MG EC tablet Take 1 tablet (81 mg total) by mouth daily. 30 tablet 0  . atorvastatin (LIPITOR) 40 MG tablet TAKE 1 TABLET (40 MG TOTAL) BY MOUTH EVERY EVENING. 90 tablet 3  . Diclofenac Sodium 1 % CREA Place 1 application onto the skin 2 (two) times daily. (Patient taking differently: Place 1 application onto the skin 2 (two) times daily as needed (pain). ) 120 g 4  . diltiazem (CARDIZEM CD) 120 MG 24 hr capsule TAKE ONE CAPSULE BY MOUTH DAILY 30 capsule 9  . ELIQUIS 5 MG TABS tablet TAKE 1 TABLET (5 MG TOTAL) BY MOUTH 2 (TWO) TIMES DAILY. 60 tablet 3  . furosemide (LASIX) 20 MG tablet Take 1 tablet (20 mg total) by mouth daily. 90 tablet 3  . gabapentin (NEURONTIN) 600 MG tablet TAKE 1 TABLET (600 MG TOTAL) BY MOUTH 3 (THREE) TIMES DAILY. 270 tablet 1  . guaiFENesin-dextromethorphan (ROBITUSSIN DM) 100-10 MG/5ML syrup Take 5 mLs by mouth every 4 (four) hours as needed for cough. 118 mL 0  . LORazepam (ATIVAN) 0.5 MG tablet TAKE ONE TABLET BY MOUTH TWICE DAILY 60 tablet 2  . metFORMIN (GLUCOPHAGE) 500 MG tablet TAKE 1 TABLET (500 MG TOTAL) BY MOUTH 2 (TWO) TIMES DAILY WITH A MEAL. 180 tablet 1  . metoprolol tartrate (LOPRESSOR) 25 MG tablet Take 1 tablet (25 mg total) by mouth 2 (two) times daily. 180 tablet 3  . Multiple Vitamins-Minerals (CENTRUM SILVER ULTRA WOMENS PO) Take 1 capsule by mouth daily.     . nitroGLYCERIN (NITROSTAT) 0.4 MG SL tablet Place 0.4 mg under the tongue every 5 (five) minutes as needed for chest pain.    . potassium chloride (K-DUR,KLOR-CON) 10 MEQ tablet TAKE ONE TABLET BY MOUTH DAILY 30 tablet 1  . predniSONE (DELTASONE) 20 MG  tablet Take 1 tablet (20 mg total) by mouth daily with breakfast. 16 tablet 0  . promethazine (PHENERGAN) 25 MG tablet TAKE 1 TABLET BY MOUTH EVERY 6 HOURS AS NEEDED FOR NAUSEA OR VOMITING 20 tablet 1  . sertraline (ZOLOFT) 50 MG tablet Take 25 mg by mouth daily.    Marland Kitchen SPIRIVA HANDIHALER 18 MCG inhalation capsule PLACE 1  CAPSULE (18 MCG TOTAL) INTO INHALER AND INHALE DAILY. 30 capsule 5  . triamcinolone cream (KENALOG) 0.1 % Apply 1 application topically daily as needed (affected area).   0   No current facility-administered medications on file prior to visit.    BP 118/56 mmHg  Pulse 55  Temp(Src) 98.4 F (36.9 C) (Oral)  Resp 20  Ht 5\' 2"  (1.575 m)  Wt 150 lb (68.04 kg)  BMI 27.43 kg/m2  SpO2 94%      Review of Systems  Constitutional: Positive for activity change, appetite change, fatigue and unexpected weight change.  HENT: Positive for voice change. Negative for congestion, dental problem, hearing loss, rhinorrhea, sinus pressure, sore throat and tinnitus.   Eyes: Negative for pain, discharge and visual disturbance.  Respiratory: Negative for cough and shortness of breath.   Cardiovascular: Negative for chest pain, palpitations and leg swelling.  Gastrointestinal: Negative for nausea, vomiting, abdominal pain, diarrhea, constipation, blood in stool and abdominal distention.  Genitourinary: Negative for dysuria, urgency, frequency, hematuria, flank pain, vaginal bleeding, vaginal discharge, difficulty urinating, vaginal pain and pelvic pain.  Musculoskeletal: Negative for joint swelling, arthralgias and gait problem.  Skin: Negative for rash.  Neurological: Negative for dizziness, syncope, speech difficulty, weakness, numbness and headaches.  Hematological: Negative for adenopathy.  Psychiatric/Behavioral: Negative for behavioral problems, dysphoric mood and agitation. The patient is not nervous/anxious.        Objective:   Physical Exam  Constitutional: She is oriented to  person, place, and time. She appears well-developed and well-nourished.  Blood pressure 110/60 left arm Blood pressure 90/50 right arm  HENT:  Head: Normocephalic.  Right Ear: External ear normal.  Left Ear: External ear normal.  Mouth/Throat: Oropharynx is clear and moist.  Eyes: Conjunctivae and EOM are normal. Pupils are equal, round, and reactive to light.  Neck: Normal range of motion. Neck supple. No thyromegaly present.  Bilateral loud bruits  Cardiovascular: Normal rate, regular rhythm and intact distal pulses.   Murmur heard. Grade 3/6 systolic murmur  Pulmonary/Chest: Effort normal. She has rales.  Rales right base  Abdominal: Soft. Bowel sounds are normal. She exhibits no mass. There is no tenderness.  Musculoskeletal: Normal range of motion. She exhibits no edema.  Lymphadenopathy:    She has no cervical adenopathy.  Neurological: She is alert and oriented to person, place, and time.  Skin: Skin is warm and dry. No rash noted.  Psychiatric: She has a normal mood and affect. Her behavior is normal.          Assessment & Plan:   Probable viral syndrome with fatigue, myalgias, anorexia, hoarseness Advanced COPD Coronary artery disease Aortic stenosis  No change in medical regimen Patient has been asked to rest.  Force fluids and report any clinical worsening

## 2016-01-13 NOTE — Patient Instructions (Signed)
Drink as much fluid as you  can tolerate over the next few days  Take [phone] mg of Tylenol every 6 hours as needed for pain relief or fever.  Avoid taking more than 3000 mg in a 24-hour period (  This may cause liver damage).

## 2016-01-13 NOTE — Progress Notes (Signed)
   Subjective:    Patient ID: [name], female    DOB: 01-21-1941, 75 y.o.   MRN: [id number]  HPI  Wt Readings from Last 3 Encounters:  01/13/16 150 lb (68.04 kg)  12/12/15 140 lb (63.504 kg)  12/04/15 141 lb (63.957 kg)    Review of Systems     Objective:   Physical Exam        Assessment & Plan:

## 2016-01-14 ENCOUNTER — Encounter (HOSPITAL_COMMUNITY): Payer: Self-pay | Admitting: Vascular Surgery

## 2016-01-14 ENCOUNTER — Telehealth: Payer: Self-pay

## 2016-01-14 ENCOUNTER — Inpatient Hospital Stay (HOSPITAL_COMMUNITY)
Admission: EM | Admit: 2016-01-14 | Discharge: 2016-02-28 | DRG: 193 | Disposition: E | Payer: Medicare Other | Attending: Internal Medicine | Admitting: Internal Medicine

## 2016-01-14 ENCOUNTER — Emergency Department (HOSPITAL_COMMUNITY): Payer: Medicare Other

## 2016-01-14 DIAGNOSIS — R001 Bradycardia, unspecified: Secondary | ICD-10-CM | POA: Diagnosis present

## 2016-01-14 DIAGNOSIS — I10 Essential (primary) hypertension: Secondary | ICD-10-CM | POA: Diagnosis present

## 2016-01-14 DIAGNOSIS — Z66 Do not resuscitate: Secondary | ICD-10-CM | POA: Diagnosis not present

## 2016-01-14 DIAGNOSIS — R7989 Other specified abnormal findings of blood chemistry: Secondary | ICD-10-CM | POA: Diagnosis not present

## 2016-01-14 DIAGNOSIS — Z9981 Dependence on supplemental oxygen: Secondary | ICD-10-CM

## 2016-01-14 DIAGNOSIS — Z7984 Long term (current) use of oral hypoglycemic drugs: Secondary | ICD-10-CM

## 2016-01-14 DIAGNOSIS — I5032 Chronic diastolic (congestive) heart failure: Secondary | ICD-10-CM | POA: Diagnosis present

## 2016-01-14 DIAGNOSIS — I248 Other forms of acute ischemic heart disease: Secondary | ICD-10-CM | POA: Diagnosis present

## 2016-01-14 DIAGNOSIS — J101 Influenza due to other identified influenza virus with other respiratory manifestations: Secondary | ICD-10-CM | POA: Diagnosis present

## 2016-01-14 DIAGNOSIS — Z833 Family history of diabetes mellitus: Secondary | ICD-10-CM

## 2016-01-14 DIAGNOSIS — J9620 Acute and chronic respiratory failure, unspecified whether with hypoxia or hypercapnia: Secondary | ICD-10-CM | POA: Diagnosis present

## 2016-01-14 DIAGNOSIS — R531 Weakness: Secondary | ICD-10-CM

## 2016-01-14 DIAGNOSIS — D649 Anemia, unspecified: Secondary | ICD-10-CM | POA: Diagnosis present

## 2016-01-14 DIAGNOSIS — Z79899 Other long term (current) drug therapy: Secondary | ICD-10-CM

## 2016-01-14 DIAGNOSIS — J704 Drug-induced interstitial lung disorders, unspecified: Secondary | ICD-10-CM | POA: Diagnosis present

## 2016-01-14 DIAGNOSIS — Z86718 Personal history of other venous thrombosis and embolism: Secondary | ICD-10-CM

## 2016-01-14 DIAGNOSIS — I739 Peripheral vascular disease, unspecified: Secondary | ICD-10-CM | POA: Diagnosis present

## 2016-01-14 DIAGNOSIS — F329 Major depressive disorder, single episode, unspecified: Secondary | ICD-10-CM | POA: Diagnosis present

## 2016-01-14 DIAGNOSIS — K219 Gastro-esophageal reflux disease without esophagitis: Secondary | ICD-10-CM | POA: Diagnosis present

## 2016-01-14 DIAGNOSIS — Z7982 Long term (current) use of aspirin: Secondary | ICD-10-CM

## 2016-01-14 DIAGNOSIS — J1000 Influenza due to other identified influenza virus with unspecified type of pneumonia: Secondary | ICD-10-CM | POA: Diagnosis not present

## 2016-01-14 DIAGNOSIS — E1165 Type 2 diabetes mellitus with hyperglycemia: Secondary | ICD-10-CM | POA: Diagnosis present

## 2016-01-14 DIAGNOSIS — Z8673 Personal history of transient ischemic attack (TIA), and cerebral infarction without residual deficits: Secondary | ICD-10-CM

## 2016-01-14 DIAGNOSIS — I480 Paroxysmal atrial fibrillation: Secondary | ICD-10-CM | POA: Diagnosis present

## 2016-01-14 DIAGNOSIS — R778 Other specified abnormalities of plasma proteins: Secondary | ICD-10-CM | POA: Insufficient documentation

## 2016-01-14 DIAGNOSIS — I2510 Atherosclerotic heart disease of native coronary artery without angina pectoris: Secondary | ICD-10-CM | POA: Diagnosis present

## 2016-01-14 DIAGNOSIS — Z885 Allergy status to narcotic agent status: Secondary | ICD-10-CM

## 2016-01-14 DIAGNOSIS — R740 Nonspecific elevation of levels of transaminase and lactic acid dehydrogenase [LDH]: Secondary | ICD-10-CM | POA: Diagnosis present

## 2016-01-14 DIAGNOSIS — T380X5A Adverse effect of glucocorticoids and synthetic analogues, initial encounter: Secondary | ICD-10-CM | POA: Diagnosis present

## 2016-01-14 DIAGNOSIS — R0602 Shortness of breath: Secondary | ICD-10-CM

## 2016-01-14 DIAGNOSIS — Z515 Encounter for palliative care: Secondary | ICD-10-CM | POA: Diagnosis not present

## 2016-01-14 DIAGNOSIS — W19XXXA Unspecified fall, initial encounter: Secondary | ICD-10-CM | POA: Diagnosis present

## 2016-01-14 DIAGNOSIS — M791 Myalgia: Secondary | ICD-10-CM | POA: Diagnosis present

## 2016-01-14 DIAGNOSIS — E119 Type 2 diabetes mellitus without complications: Secondary | ICD-10-CM

## 2016-01-14 DIAGNOSIS — N179 Acute kidney failure, unspecified: Secondary | ICD-10-CM | POA: Diagnosis present

## 2016-01-14 DIAGNOSIS — F419 Anxiety disorder, unspecified: Secondary | ICD-10-CM | POA: Diagnosis present

## 2016-01-14 DIAGNOSIS — T501X5A Adverse effect of loop [high-ceiling] diuretics, initial encounter: Secondary | ICD-10-CM | POA: Diagnosis not present

## 2016-01-14 DIAGNOSIS — Z8249 Family history of ischemic heart disease and other diseases of the circulatory system: Secondary | ICD-10-CM

## 2016-01-14 DIAGNOSIS — R410 Disorientation, unspecified: Secondary | ICD-10-CM | POA: Diagnosis not present

## 2016-01-14 DIAGNOSIS — Z87891 Personal history of nicotine dependence: Secondary | ICD-10-CM

## 2016-01-14 DIAGNOSIS — J449 Chronic obstructive pulmonary disease, unspecified: Secondary | ICD-10-CM | POA: Diagnosis present

## 2016-01-14 DIAGNOSIS — I110 Hypertensive heart disease with heart failure: Secondary | ICD-10-CM | POA: Diagnosis present

## 2016-01-14 DIAGNOSIS — Z951 Presence of aortocoronary bypass graft: Secondary | ICD-10-CM

## 2016-01-14 DIAGNOSIS — E785 Hyperlipidemia, unspecified: Secondary | ICD-10-CM | POA: Diagnosis present

## 2016-01-14 DIAGNOSIS — Z7952 Long term (current) use of systemic steroids: Secondary | ICD-10-CM

## 2016-01-14 DIAGNOSIS — E876 Hypokalemia: Secondary | ICD-10-CM | POA: Diagnosis present

## 2016-01-14 DIAGNOSIS — J80 Acute respiratory distress syndrome: Secondary | ICD-10-CM | POA: Diagnosis not present

## 2016-01-14 DIAGNOSIS — J111 Influenza due to unidentified influenza virus with other respiratory manifestations: Secondary | ICD-10-CM

## 2016-01-14 DIAGNOSIS — J9621 Acute and chronic respiratory failure with hypoxia: Secondary | ICD-10-CM | POA: Diagnosis present

## 2016-01-14 LAB — BASIC METABOLIC PANEL
Anion gap: 14 (ref 5–15)
BUN: 32 mg/dL — AB (ref 6–20)
CALCIUM: 8.7 mg/dL — AB (ref 8.9–10.3)
CHLORIDE: 101 mmol/L (ref 101–111)
CO2: 23 mmol/L (ref 22–32)
CREATININE: 1.64 mg/dL — AB (ref 0.44–1.00)
GFR calc Af Amer: 34 mL/min — ABNORMAL LOW (ref >60)
GFR, EST NON AFRICAN AMERICAN: 30 mL/min — AB (ref >60)
Glucose, Bld: 129 mg/dL — ABNORMAL HIGH (ref 65–99)
Potassium: 4.8 mmol/L (ref 3.5–5.1)
SODIUM: 138 mmol/L (ref 135–145)

## 2016-01-14 LAB — CBC
HCT: 28.6 % — ABNORMAL LOW (ref 36.0–46.0)
Hemoglobin: 8.3 g/dL — ABNORMAL LOW (ref 12.0–15.0)
MCH: 23 pg — AB (ref 26.0–34.0)
MCHC: 29 g/dL — ABNORMAL LOW (ref 30.0–36.0)
MCV: 79.2 fL (ref 78.0–100.0)
PLATELETS: 210 10*3/uL (ref 150–400)
RBC: 3.61 MIL/uL — AB (ref 3.87–5.11)
RDW: 16.8 % — ABNORMAL HIGH (ref 11.5–15.5)
WBC: 10 10*3/uL (ref 4.0–10.5)

## 2016-01-14 LAB — I-STAT TROPONIN, ED: TROPONIN I, POC: 0.19 ng/mL — AB (ref 0.00–0.08)

## 2016-01-14 MED ORDER — ASPIRIN 81 MG PO CHEW
324.0000 mg | CHEWABLE_TABLET | Freq: Once | ORAL | Status: AC
  Administered 2016-01-14: 324 mg via ORAL
  Filled 2016-01-14: qty 4

## 2016-01-14 MED ORDER — SODIUM CHLORIDE 0.9 % IV BOLUS (SEPSIS)
500.0000 mL | Freq: Once | INTRAVENOUS | Status: AC
  Administered 2016-01-14: 500 mL via INTRAVENOUS

## 2016-01-14 NOTE — ED Notes (Signed)
Elevated  triponin pt moved up on the list to go back

## 2016-01-14 NOTE — ED Provider Notes (Signed)
CSN: [id number]     Arrival date & time 01/26/2016  1902 History   First MD Initiated Contact with Patient 01/21/2016 2155     Chief Complaint  Patient presents with  . Weakness     (Consider location/radiation/quality/duration/timing/severity/associated sxs/prior Treatment) HPI Comments: 2 days of generalized weakness and fatigue with fall this morning.  Patient states she saw PCP and was told she had a virus.  Fell last night onto R hip and strike R forehead.  No LOC, but on eliquis. Daughter thought her voice was slurred on a voicemail this morning but is normal now. Hx COPD on home O2 with low saturations at home but normal here.  She denies any SOB or CP.  No fever. No vomiting, diarrhea, or abdominal pain.  No sick contacts.  Troponin drawn in triage is positive.  Denies ever having chest pain but had CABG remotely.  The history is provided by the patient and a relative.    Past Medical History  Diagnosis Date  . ANEMIA 08/28/2010  . CAD (coronary artery disease)     a. s/p CABGx4 in 2001.  . Adenomatous colon polyp 2000  . COPD (chronic obstructive pulmonary disease) ([place])   . Depressive disorder   . GERD 12/19/2009  . Hyperlipidemia   . Hypertension   . HYPOTENSION 08/11/2010  . PVD (peripheral vascular disease) ([place])     a. Duplex 03/2013: stable moderate carotid dz - 123456 RICA, [id number] LICA, L subclavian artery to CCA stent widely patent.  b. Prior R fempop bypass graft, R CIA stent.  . Lung abscess ([place]) 2011  . CHF (congestive heart failure) ([place])   . Diverticulosis   . Aortic stenosis     a. Mild by echo 01/2013.  Marland Kitchen Ringing in ears   . Anxiety   . Joint pain   . On home oxygen therapy     "down to 2L just at night lately" (11/27/2015)  . Atrial fibrillation ([place])   . DVT (deep venous thrombosis) ([facility]) X 1    RLE  . Type II diabetes mellitus ([place])   . History of blood transfusion 2011; 08/2014    "related to abscess on my lung OR; internal bleeding, had dark stools"   . Stroke [facility]) ~ 01/2014    "TIA"  . Hiatal hernia     a. Dx 01/2014, placed on Xarelto.  . Osteoarthritis     Lumbar stenosis  . Arthritis     "hands maily; arms too" (11/27/2015)   Past Surgical History  Procedure Laterality Date  . Subclavian bypass graft Left 07/2004  . Tubal ligation    . Coronary artery bypass graft  05/2000    LIMA-LAD, SVG-OM, SVG-PDA-PL  . Femoral-popliteal bypass graft Right 04/1999  . Angioplasty / stenting iliac Right 07/2000    common iliac PTA with stent placement  . Right leg blockage Right 2003  . Aorta - bilateral femoral artery bypass graft  2004  . Carotid endarterectomy Left 2005  . Lung biopsy  2011    cyst  . Esophagogastroduodenoscopy N/A 08/03/2014    Procedure: ESOPHAGOGASTRODUODENOSCOPY (EGD);  Surgeon: Juanita Craver, MD;  Location: [facility] ENDOSCOPY;  Service: Endoscopy;  Laterality: N/A;  . Colonoscopy Left 08/05/2014    Procedure: COLONOSCOPY;  Surgeon: Juanita Craver, MD;  Location: [place];  Service: Endoscopy;  Laterality: Left;  Freda Munro capsule study N/A 08/06/2014    Procedure: GIVENS CAPSULE STUDY;  Surgeon: Irene Shipper, MD;  Location: [place];  Service:  Endoscopy;  Laterality: N/A;  . Left heart catheterization with coronary/graft angiogram  03/11/2014    Procedure: LEFT HEART CATHETERIZATION WITH Beatrix Fetters;  Surgeon: Peter M Martinique, MD;  Location: [facility] CATH LAB;  Service: Cardiovascular;;  . Cataract extraction, bilateral Bilateral ~ 2008   Family History  Problem Relation Age of Onset  . Diabetes Father   . Heart attack Mother   . Diabetes    . Coronary artery disease    . Colon cancer Neg Hx    Social History  Substance Use Topics  . Smoking status: Former Smoker -- 1.50 packs/day for 40 years    Types: Cigarettes    Quit date: 06/29/2000  . Smokeless tobacco: Never Used  . Alcohol Use: No   OB History    No data available     Review of Systems  Constitutional: Positive for activity change, appetite  change and fatigue. Negative for fever.  HENT: Negative for congestion and rhinorrhea.   Respiratory: Negative for cough, chest tightness and shortness of breath.   Cardiovascular: Negative for chest pain.  Gastrointestinal: Negative for nausea, vomiting and abdominal pain.  Genitourinary: Negative for dysuria and hematuria.  Musculoskeletal: Positive for myalgias and arthralgias. Negative for back pain and neck pain.  Skin: Negative for rash.  Neurological: Positive for weakness and headaches. Negative for dizziness and light-headedness.      Allergies  Codeine  Home Medications   Prior to Admission medications   Medication Sig Start Date End Date Taking? Authorizing Provider  albuterol (PROVENTIL HFA;VENTOLIN HFA) 108 (90 Base) MCG/ACT inhaler Inhale 2 puffs into the lungs every 4 (four) hours as needed for wheezing or shortness of breath. 11/28/15  Yes Nishant Dhungel, MD  amiodarone (PACERONE) 200 MG tablet Take 0.5 tablets (100 mg total) by mouth daily. 11/17/15  Yes Dorothy Spark, MD  aspirin EC 81 MG EC tablet Take 1 tablet (81 mg total) by mouth daily. 08/16/14  Yes Hosie Poisson, MD  atorvastatin (LIPITOR) 40 MG tablet TAKE 1 TABLET (40 MG TOTAL) BY MOUTH EVERY EVENING. 01/09/16  Yes Dorothy Spark, MD  Diclofenac Sodium 1 % CREA Place 1 application onto the skin 2 (two) times daily. Patient taking differently: Place 1 application onto the skin 2 (two) times daily as needed (pain).  09/29/15  Yes Marletta Lor, MD  diltiazem (CARDIZEM CD) 120 MG 24 hr capsule TAKE ONE CAPSULE BY MOUTH DAILY 12/24/15  Yes Dorothy Spark, MD  ELIQUIS 5 MG TABS tablet TAKE 1 TABLET (5 MG TOTAL) BY MOUTH 2 (TWO) TIMES DAILY. 11/26/15  Yes Dorothy Spark, MD  furosemide (LASIX) 20 MG tablet Take 1 tablet (20 mg total) by mouth daily. 11/04/14  Yes Dorothy Spark, MD  gabapentin (NEURONTIN) 600 MG tablet TAKE 1 TABLET (600 MG TOTAL) BY MOUTH 3 (THREE) TIMES DAILY. 10/27/15  Yes Marletta Lor, MD  guaiFENesin-dextromethorphan [facility] DM) 100-10 MG/5ML syrup Take 5 mLs by mouth every 4 (four) hours as needed for cough. 11/28/15  Yes Nishant Dhungel, MD  LORazepam (ATIVAN) 0.5 MG tablet TAKE ONE TABLET BY MOUTH TWICE DAILY 12/30/15  Yes Marletta Lor, MD  metFORMIN (GLUCOPHAGE) 500 MG tablet TAKE 1 TABLET (500 MG TOTAL) BY MOUTH 2 (TWO) TIMES DAILY WITH A MEAL. 11/11/15  Yes Marletta Lor, MD  metoprolol tartrate (LOPRESSOR) 25 MG tablet Take 1 tablet (25 mg total) by mouth 2 (two) times daily. 02/18/15  Yes Dorothy Spark, MD  Multiple Vitamins-Minerals (CENTRUM  SILVER ULTRA WOMENS PO) Take 1 capsule by mouth daily.    Yes Historical Provider, MD  nitroGLYCERIN (NITROSTAT) 0.4 MG SL tablet Place 0.4 mg under the tongue every 5 (five) minutes as needed for chest pain.   Yes Historical Provider, MD  potassium chloride (K-DUR,KLOR-CON) 10 MEQ tablet TAKE ONE TABLET BY MOUTH DAILY 12/17/15  Yes Marletta Lor, MD  predniSONE (DELTASONE) 20 MG tablet Take 1 tablet (20 mg total) by mouth daily with breakfast. 11/28/15  Yes Nishant Dhungel, MD  promethazine (PHENERGAN) 25 MG tablet TAKE 1 TABLET BY MOUTH EVERY 6 HOURS AS NEEDED FOR NAUSEA OR VOMITING 10/17/15  Yes Marletta Lor, MD  sertraline (ZOLOFT) 50 MG tablet Take 25 mg by mouth daily.   Yes Historical Provider, MD  SPIRIVA HANDIHALER 18 MCG inhalation capsule PLACE 1 CAPSULE (18 MCG TOTAL) INTO INHALER AND INHALE DAILY. 05/13/15  Yes Marletta Lor, MD  triamcinolone cream (KENALOG) 0.1 % Apply 1 application topically daily as needed (affected area).  09/26/15  Yes Historical Provider, MD   BP 113/37 mmHg  Pulse 45  Temp(Src) 97.8 F (36.6 C) (Oral)  Resp 12  SpO2 94% Physical Exam  Constitutional: She is oriented to person, place, and time. She appears well-developed and well-nourished. No distress.  HENT:  Head: Normocephalic and atraumatic.  Mouth/Throat: Oropharynx is clear and  moist. No oropharyngeal exudate.  Hematoma R forehead  Eyes: Conjunctivae and EOM are normal. Pupils are equal, round, and reactive to light.  Neck: Normal range of motion. Neck supple.  No C spine tenderness  Cardiovascular: Normal rate, regular rhythm, normal heart sounds and intact distal pulses.   No murmur heard. Sinus bradycardia  Pulmonary/Chest: Effort normal and breath sounds normal. No respiratory distress.  Abdominal: Soft. There is no tenderness. There is no rebound and no guarding.  Musculoskeletal: Normal range of motion. She exhibits no edema or tenderness.  FROM hips and knees bilaterally  Neurological: She is alert and oriented to person, place, and time. No cranial nerve deficit. She exhibits normal muscle tone. Coordination normal.  No ataxia on finger to nose bilaterally. No pronator drift. 5/5 strength throughout. CN 2-12 intact.Equal grip strength. Sensation intact.   Skin: Skin is warm.  Psychiatric: She has a normal mood and affect. Her behavior is normal.  Nursing note and vitals reviewed.   ED Course  Procedures (including critical care time) Labs Review Labs Reviewed  BASIC METABOLIC PANEL - Abnormal; Notable for the following:    Glucose, Bld 129 (*)    BUN 32 (*)    Creatinine, Ser 1.64 (*)    Calcium 8.7 (*)    GFR calc non Af Amer 30 (*)    GFR calc Af Amer 34 (*)    All other components within normal limits  CBC - Abnormal; Notable for the following:    RBC 3.61 (*)    Hemoglobin 8.3 (*)    HCT 28.6 (*)    MCH 23.0 (*)    MCHC 29.0 (*)    RDW 16.8 (*)    All other components within normal limits  I-STAT TROPOININ, ED - Abnormal; Notable for the following:    Troponin i, poc 0.19 (*)    All other components within normal limits  URINALYSIS, ROUTINE W REFLEX MICROSCOPIC (NOT AT [facility])  POC OCCULT BLOOD, ED    Imaging Review Dg Chest 2 View  01/03/2016  CLINICAL DATA:  Weakness and shortness of breath for about 1 week. EXAM: CHEST  2 VIEW  COMPARISON:  11/27/2015.  08/18/2015. FINDINGS: The lungs are clear wiithout focal pneumonia, edema, pneumothorax or pleural effusion. Interstitial markings are diffusely coarsened with chronic features. Stable appearance of basilar interstitial scarring, right greater than left. The cardio pericardial silhouette is enlarged. Patient is status post CABG. The visualized bony structures of the thorax are intact. Bones are diffusely demineralized. IMPRESSION: Stable exam. Cardiomegaly with chronic underlying interstitial lung disease. Prominence of the interstitial markings at the right base unchanged. Electronically Signed   By: Misty Stanley M.D.   On: 01/15/2016 20:26   Dg Hip Unilat  With Pelvis 2-3 Views Right  01/11/2016  CLINICAL DATA:  75 year old female with fall and right hip pain. EXAM: DG HIP (WITH OR WITHOUT PELVIS) 2-3V RIGHT COMPARISON:  None. FINDINGS: There is no acute fracture or dislocation. There is mild osteopenia. There is degenerative changes of the lower lumbar spine. Moderate stool throughout the colon. Soft tissues appear unremarkable. Multiple surgical clips noted over the right hip. IMPRESSION: No acute fracture or dislocation. Electronically Signed   By: Anner Crete M.D.   On: 01/16/2016 20:27   I have personally reviewed and evaluated these images and lab results as part of my medical decision-making.   EKG Interpretation   Date/Time:  Wednesday January 14 2016 19:40:00 EST Ventricular Rate:  53 PR Interval:  188 QRS Duration: 94 QT Interval:  500 QTC Calculation: 469 R Axis:   20 Text Interpretation:  Sinus bradycardia Otherwise normal ECG No  significant change was found Confirmed by Wyvonnia Dusky  MD, Donna Snooks [phone]) on  01/22/2016 10:16:33 PM      MDM   Final diagnoses:  Generalized weakness  Elevated troponin   Generalized weakness with fall, hitting head.  No neuro deficits. EKG unchanged.  Troponin positive 0.19, denies CP.  CXR stable. Stable  anemia.  Cr worse than baseline.  Gentle IVF hydration.  Elevated troponin unclear, may be due to dehydration and AKI.  Denies any recent chest pain. EKG reassuring.  Will need admission for observation.  D/w DR. Hal Hope.  CT head still pending as CT is very delayed today. Need CT given anticoagulation and history of trauma. Awake and alert in the ED with nonfocal neuro exam.  Ezequiel Essex, MD 01/15/16 [phone]

## 2016-01-14 NOTE — Telephone Encounter (Signed)
Pt requesting sertraline (ZOLOFT) 50 MG tablet Pt last visit 01/13/16

## 2016-01-14 NOTE — ED Notes (Signed)
Pt reports to the ED for eval of generalized weakness, fatigue, increased weight gain, altered mental status this am, and fall. Pt reports she has been not feeling well for a few days. She went to her PCP and was dx with a virus and told to go home and rest and drink fluids. Pt reports she feels no better and also fell last pm. She is complaining of right hip pain adn has a bruise to her forehead. Daughter reports this am she called and left her a voicemail and she was having slurred speech. Speech normal at this time. Pt denies any LOC from the fall or N/V/D. She wears O2 chronically and her daughter also reports her O2 was 70s-80s on her home monitor. O2 85% at this time. Pt A&Ox4, resp e/u, and skin cool and dry.

## 2016-01-15 ENCOUNTER — Encounter (HOSPITAL_COMMUNITY): Payer: Self-pay | Admitting: Internal Medicine

## 2016-01-15 ENCOUNTER — Observation Stay (HOSPITAL_COMMUNITY): Payer: Medicare Other

## 2016-01-15 DIAGNOSIS — R531 Weakness: Secondary | ICD-10-CM

## 2016-01-15 DIAGNOSIS — I480 Paroxysmal atrial fibrillation: Secondary | ICD-10-CM | POA: Diagnosis not present

## 2016-01-15 LAB — URINALYSIS, ROUTINE W REFLEX MICROSCOPIC
Bilirubin Urine: NEGATIVE
Glucose, UA: NEGATIVE mg/dL
Hgb urine dipstick: NEGATIVE
KETONES UR: NEGATIVE mg/dL
LEUKOCYTES UA: NEGATIVE
Nitrite: POSITIVE — AB
PROTEIN: 30 mg/dL — AB
Specific Gravity, Urine: 1.015 (ref 1.005–1.030)
pH: 6 (ref 5.0–8.0)

## 2016-01-15 LAB — CK: CK TOTAL: 388 U/L — AB (ref 38–234)

## 2016-01-15 LAB — CBC
HEMATOCRIT: 27.4 % — AB (ref 36.0–46.0)
HEMOGLOBIN: 8 g/dL — AB (ref 12.0–15.0)
MCH: 23 pg — ABNORMAL LOW (ref 26.0–34.0)
MCHC: 29.2 g/dL — ABNORMAL LOW (ref 30.0–36.0)
MCV: 78.7 fL (ref 78.0–100.0)
Platelets: 165 10*3/uL (ref 150–400)
RBC: 3.48 MIL/uL — ABNORMAL LOW (ref 3.87–5.11)
RDW: 17 % — AB (ref 11.5–15.5)
WBC: 8.4 10*3/uL (ref 4.0–10.5)

## 2016-01-15 LAB — URINE MICROSCOPIC-ADD ON: RBC / HPF: NONE SEEN RBC/hpf (ref 0–5)

## 2016-01-15 LAB — INFLUENZA PANEL BY PCR (TYPE A & B)
H1N1 flu by pcr: NOT DETECTED
INFLAPCR: POSITIVE — AB
INFLBPCR: NEGATIVE

## 2016-01-15 LAB — TSH
TSH: 7.598 u[IU]/mL — ABNORMAL HIGH (ref 0.350–4.500)
TSH: 4.623 u[IU]/mL — ABNORMAL HIGH (ref 0.350–4.500)

## 2016-01-15 LAB — TROPONIN I
TROPONIN I: 0.08 ng/mL — AB (ref <0.031)
Troponin I: 0.1 ng/mL — ABNORMAL HIGH (ref <0.031)
Troponin I: 0.12 ng/mL — ABNORMAL HIGH (ref <0.031)
Troponin I: 0.11 ng/mL — ABNORMAL HIGH (ref <0.031)
Troponin I: 0.1 ng/mL — ABNORMAL HIGH (ref <0.031)

## 2016-01-15 LAB — CBG MONITORING, ED
GLUCOSE-CAPILLARY: 111 mg/dL — AB (ref 65–99)
Glucose-Capillary: 128 mg/dL — ABNORMAL HIGH (ref 65–99)

## 2016-01-15 LAB — GLUCOSE, CAPILLARY
GLUCOSE-CAPILLARY: 144 mg/dL — AB (ref 65–99)
Glucose-Capillary: 153 mg/dL — ABNORMAL HIGH (ref 65–99)

## 2016-01-15 LAB — COMPREHENSIVE METABOLIC PANEL
ALK PHOS: 95 U/L (ref 38–126)
ALT: 595 U/L — ABNORMAL HIGH (ref 14–54)
ANION GAP: 12 (ref 5–15)
AST: 1078 U/L — ABNORMAL HIGH (ref 15–41)
Albumin: 2.9 g/dL — ABNORMAL LOW (ref 3.5–5.0)
BILIRUBIN TOTAL: 0.6 mg/dL (ref 0.3–1.2)
BUN: 28 mg/dL — AB (ref 6–20)
CALCIUM: 8.4 mg/dL — AB (ref 8.9–10.3)
CO2: 22 mmol/L (ref 22–32)
Chloride: 104 mmol/L (ref 101–111)
Creatinine, Ser: 1.37 mg/dL — ABNORMAL HIGH (ref 0.44–1.00)
GFR calc Af Amer: 43 mL/min — ABNORMAL LOW (ref >60)
GFR, EST NON AFRICAN AMERICAN: 37 mL/min — AB (ref >60)
Glucose, Bld: 145 mg/dL — ABNORMAL HIGH (ref 65–99)
POTASSIUM: 4 mmol/L (ref 3.5–5.1)
Sodium: 138 mmol/L (ref 135–145)
TOTAL PROTEIN: 5.9 g/dL — AB (ref 6.5–8.1)

## 2016-01-15 MED ORDER — APIXABAN 5 MG PO TABS
5.0000 mg | ORAL_TABLET | Freq: Two times a day (BID) | ORAL | Status: DC
Start: 2016-01-15 — End: 2016-01-27
  Administered 2016-01-15 – 2016-01-26 (×23): 5 mg via ORAL
  Filled 2016-01-15 (×24): qty 1

## 2016-01-15 MED ORDER — OSELTAMIVIR PHOSPHATE 30 MG PO CAPS
30.0000 mg | ORAL_CAPSULE | Freq: Two times a day (BID) | ORAL | Status: AC
  Administered 2016-01-15 – 2016-01-20 (×10): 30 mg via ORAL
  Filled 2016-01-15 (×11): qty 1

## 2016-01-15 MED ORDER — ALBUTEROL SULFATE (2.5 MG/3ML) 0.083% IN NEBU
2.5000 mg | INHALATION_SOLUTION | RESPIRATORY_TRACT | Status: DC
  Administered 2016-01-15: 2.5 mg via RESPIRATORY_TRACT
  Filled 2016-01-15: qty 3

## 2016-01-15 MED ORDER — GABAPENTIN 600 MG PO TABS
600.0000 mg | ORAL_TABLET | Freq: Three times a day (TID) | ORAL | Status: DC
  Administered 2016-01-15 – 2016-01-25 (×33): 600 mg via ORAL
  Filled 2016-01-15 (×40): qty 1

## 2016-01-15 MED ORDER — INSULIN ASPART 100 UNIT/ML ~~LOC~~ SOLN
0.0000 [IU] | Freq: Three times a day (TID) | SUBCUTANEOUS | Status: DC
  Administered 2016-01-15: 2 [IU] via SUBCUTANEOUS
  Administered 2016-01-15: 1 [IU] via SUBCUTANEOUS
  Administered 2016-01-16: 2 [IU] via SUBCUTANEOUS
  Administered 2016-01-16: 3 [IU] via SUBCUTANEOUS
  Administered 2016-01-17: 2 [IU] via SUBCUTANEOUS
  Administered 2016-01-17: 3 [IU] via SUBCUTANEOUS
  Administered 2016-01-18 – 2016-01-20 (×2): 1 [IU] via SUBCUTANEOUS
  Administered 2016-01-20: 2 [IU] via SUBCUTANEOUS
  Administered 2016-01-21: 1 [IU] via SUBCUTANEOUS
  Administered 2016-01-21: 3 [IU] via SUBCUTANEOUS
  Administered 2016-01-22: 2 [IU] via SUBCUTANEOUS
  Administered 2016-01-22: 1 [IU] via SUBCUTANEOUS
  Administered 2016-01-22 – 2016-01-23 (×3): 2 [IU] via SUBCUTANEOUS
  Administered 2016-01-24 (×2): 3 [IU] via SUBCUTANEOUS
  Administered 2016-01-25 – 2016-01-26 (×5): 2 [IU] via SUBCUTANEOUS
  Filled 2016-01-15: qty 1

## 2016-01-15 MED ORDER — NITROGLYCERIN 0.4 MG SL SUBL: 0.4000 mg | SUBLINGUAL_TABLET | SUBLINGUAL | Status: DC | PRN

## 2016-01-15 MED ORDER — SODIUM CHLORIDE 0.9 % IV SOLN
INTRAVENOUS | Status: DC
  Administered 2016-01-15: 02:00:00 via INTRAVENOUS

## 2016-01-15 MED ORDER — ATORVASTATIN CALCIUM 40 MG PO TABS
40.0000 mg | ORAL_TABLET | Freq: Every day | ORAL | Status: DC
  Administered 2016-01-15: 40 mg via ORAL
  Filled 2016-01-15: qty 1

## 2016-01-15 MED ORDER — POTASSIUM CHLORIDE CRYS ER 20 MEQ PO TBCR
10.0000 meq | EXTENDED_RELEASE_TABLET | Freq: Every day | ORAL | Status: DC
  Administered 2016-01-15 – 2016-01-25 (×11): 10 meq via ORAL
  Filled 2016-01-15 (×11): qty 1

## 2016-01-15 MED ORDER — TIOTROPIUM BROMIDE MONOHYDRATE 18 MCG IN CAPS
18.0000 ug | ORAL_CAPSULE | Freq: Every day | RESPIRATORY_TRACT | Status: DC
  Administered 2016-01-15: 18 ug via RESPIRATORY_TRACT
  Filled 2016-01-15 (×2): qty 5

## 2016-01-15 MED ORDER — AMIODARONE HCL 200 MG PO TABS
100.0000 mg | ORAL_TABLET | Freq: Every day | ORAL | Status: DC
  Administered 2016-01-15 – 2016-01-16 (×2): 100 mg via ORAL
  Filled 2016-01-15 (×3): qty 1

## 2016-01-15 MED ORDER — CETYLPYRIDINIUM CHLORIDE 0.05 % MT LIQD
7.0000 mL | Freq: Two times a day (BID) | OROMUCOSAL | Status: DC
  Administered 2016-01-15 – 2016-01-26 (×22): 7 mL via OROMUCOSAL

## 2016-01-15 MED ORDER — ALBUTEROL SULFATE (2.5 MG/3ML) 0.083% IN NEBU
2.5000 mg | INHALATION_SOLUTION | Freq: Four times a day (QID) | RESPIRATORY_TRACT | Status: DC
  Administered 2016-01-16 – 2016-01-17 (×4): 2.5 mg via RESPIRATORY_TRACT
  Filled 2016-01-15 (×4): qty 3

## 2016-01-15 MED ORDER — SERTRALINE HCL 50 MG PO TABS
25.0000 mg | ORAL_TABLET | Freq: Every day | ORAL | Status: DC
  Administered 2016-01-15 – 2016-01-25 (×11): 25 mg via ORAL
  Filled 2016-01-15 (×11): qty 1

## 2016-01-15 MED ORDER — DILTIAZEM HCL ER COATED BEADS 120 MG PO CP24
120.0000 mg | ORAL_CAPSULE | Freq: Every day | ORAL | Status: DC
  Filled 2016-01-15: qty 1

## 2016-01-15 MED ORDER — LORAZEPAM 0.5 MG PO TABS
0.5000 mg | ORAL_TABLET | Freq: Two times a day (BID) | ORAL | Status: DC
  Administered 2016-01-15 – 2016-01-25 (×22): 0.5 mg via ORAL
  Filled 2016-01-15 (×22): qty 1

## 2016-01-15 MED ORDER — SODIUM CHLORIDE 0.9 % IV SOLN
INTRAVENOUS | Status: AC
  Administered 2016-01-15 – 2016-01-16 (×3): via INTRAVENOUS

## 2016-01-15 MED ORDER — ALBUTEROL SULFATE (2.5 MG/3ML) 0.083% IN NEBU
2.5000 mg | INHALATION_SOLUTION | RESPIRATORY_TRACT | Status: DC | PRN
  Administered 2016-01-15: 2.5 mg via RESPIRATORY_TRACT
  Filled 2016-01-15: qty 3

## 2016-01-15 MED ORDER — GUAIFENESIN-DM 100-10 MG/5ML PO SYRP
5.0000 mL | ORAL_SOLUTION | ORAL | Status: DC | PRN
  Administered 2016-01-16 – 2016-01-26 (×2): 5 mL via ORAL
  Filled 2016-01-15 (×3): qty 5

## 2016-01-15 MED ORDER — ASPIRIN EC 81 MG PO TBEC
81.0000 mg | DELAYED_RELEASE_TABLET | Freq: Every day | ORAL | Status: DC
  Administered 2016-01-15 – 2016-01-25 (×11): 81 mg via ORAL
  Filled 2016-01-15 (×11): qty 1

## 2016-01-15 MED ORDER — PREDNISONE 20 MG PO TABS
20.0000 mg | ORAL_TABLET | Freq: Every day | ORAL | Status: DC
  Administered 2016-01-15 – 2016-01-16 (×2): 20 mg via ORAL
  Filled 2016-01-15 (×2): qty 1

## 2016-01-15 MED ORDER — ALBUTEROL SULFATE (2.5 MG/3ML) 0.083% IN NEBU: 2.5000 mg | INHALATION_SOLUTION | RESPIRATORY_TRACT | Status: DC

## 2016-01-15 MED ORDER — METOPROLOL TARTRATE 25 MG PO TABS: 25.0000 mg | ORAL_TABLET | Freq: Two times a day (BID) | ORAL | Status: DC

## 2016-01-15 MED ORDER — HYDRALAZINE HCL 10 MG PO TABS
10.0000 mg | ORAL_TABLET | Freq: Three times a day (TID) | ORAL | Status: DC
  Administered 2016-01-15 – 2016-01-26 (×30): 10 mg via ORAL
  Filled 2016-01-15 (×31): qty 1

## 2016-01-15 NOTE — ED Notes (Signed)
Patient transported to CT 

## 2016-01-15 NOTE — Telephone Encounter (Signed)
Left message on voicemail to call office need to clarify dosage on Rx refill that was requested.

## 2016-01-15 NOTE — Progress Notes (Signed)
Patient seen and evaluated earlier the same by my associate. Please refer to H&P for details assessment/plan  Unsure weakness is secondary to generalized debility from decrease physical activity at home versus bradycardia worsened by home medication regimen. We'll continue to hold rate controlling medications have physical therapy evaluate.  Will reassess next am.   Velvet Bathe

## 2016-01-15 NOTE — Telephone Encounter (Signed)
ok 

## 2016-01-15 NOTE — Telephone Encounter (Signed)
Left message on voicemail to call office.  

## 2016-01-15 NOTE — H&P (Signed)
[facility] History and Physical  [name] [id number] DOB: [birth year] DOA: 01/15/2016  Referring physician: Dr. Wyvonnia Dusky. PCP: Nyoka Cowden, MD  Specialists: [facility] cardiology.  Chief Complaint: Weakness.  HPI: [name] is a 75 y.o. female with history of paroxysmal atrial fibrillation, CAD status post CABG, COPD, hypertension and diabetes mellitus type 2 and was brought to the ER to patient's daughter found the patient was weak and had some slurred speech. Patient had a fall the night before this at her house when she was trying to walk. Patient denies losing consciousness but states she did hit her right hip and right forehead. Over the last 2-3 days patient has been feeling weak generalized and not feeling well. Patient had gone to her PCP 2 days ago and was told she had virus infection. In the ER patient appeared nonfocal. CT of the head is still pending. EKG shows bradycardia. Patient will be admitted for further observation. Patient denies any chest pain or shortness of breath.   Review of Systems: As presented in the history of presenting illness, rest negative.  Past Medical History  Diagnosis Date  . ANEMIA 08/28/2010  . CAD (coronary artery disease)     a. s/p CABGx4 in 2001.  . Adenomatous colon polyp 2000  . COPD (chronic obstructive pulmonary disease) ([place])   . Depressive disorder   . GERD 12/19/2009  . Hyperlipidemia   . Hypertension   . HYPOTENSION 08/11/2010  . PVD (peripheral vascular disease) ([place])     a. Duplex 03/2013: stable moderate carotid dz - [id number] RICA, [id number] LICA, L subclavian artery to CCA stent widely patent.  b. Prior R fempop bypass graft, R CIA stent.  . Lung abscess ([place]) 2011  . CHF (congestive heart failure) ([place])   . Diverticulosis   . Aortic stenosis     a. Mild by echo 01/2013.  Marland Kitchen Ringing in ears   . Anxiety   . Joint pain   . On home oxygen therapy     "down to 2L just at night lately" (11/27/2015)  . Atrial  fibrillation ([place])   . DVT (deep venous thrombosis) (H[facility]) X 1    RLE  . Type II diabetes mellitus ([place])   . History of blood transfusion 2011; 08/2014    "related to abscess on my lung OR; internal bleeding, had dark stools"  . Stroke [facility]) ~ 01/2014    "TIA"  . Hiatal hernia     a. Dx 01/2014, placed on Xarelto.  . Osteoarthritis     Lumbar stenosis  . Arthritis     "hands maily; arms too" (11/27/2015)   Past Surgical History  Procedure Laterality Date  . Subclavian bypass graft Left 07/2004  . Tubal ligation    . Coronary artery bypass graft  05/2000    LIMA-LAD, SVG-OM, SVG-PDA-PL  . Femoral-popliteal bypass graft Right 04/1999  . Angioplasty / stenting iliac Right 07/2000    common iliac PTA with stent placement  . Right leg blockage Right 2003  . Aorta - bilateral femoral artery bypass graft  2004  . Carotid endarterectomy Left 2005  . Lung biopsy  2011    cyst  . Esophagogastroduodenoscopy N/A 08/03/2014    Procedure: ESOPHAGOGASTRODUODENOSCOPY (EGD);  Surgeon: Juanita Craver, MD;  Location: [facility] ENDOSCOPY;  Service: Endoscopy;  Laterality: N/A;  . Colonoscopy Left 08/05/2014    Procedure: COLONOSCOPY;  Surgeon: Juanita Craver, MD;  Location: [place];  Service: Endoscopy;  Laterality: Left;  Freda Munro capsule  study N/A 08/06/2014    Procedure: GIVENS CAPSULE STUDY;  Surgeon: Irene Shipper, MD;  Location: [facility] ENDOSCOPY;  Service: Endoscopy;  Laterality: N/A;  . Left heart catheterization with coronary/graft angiogram  03/11/2014    Procedure: LEFT HEART CATHETERIZATION WITH Beatrix Fetters;  Surgeon: Peter M Martinique, MD;  Location: [facility] CATH LAB;  Service: Cardiovascular;;  . Cataract extraction, bilateral Bilateral ~ 2008   Social History:  reports that she quit smoking about 15 years ago. Her smoking use included Cigarettes. She has a 60 pack-year smoking history. She has never used smokeless tobacco. She reports that she does not drink alcohol or use illicit drugs. Where does  patient live home. Can patient participate in ADLs? Yes.  Allergies  Allergen Reactions  . Codeine Nausea And Vomiting    Family History:  Family History  Problem Relation Age of Onset  . Diabetes Father   . Heart attack Mother   . Diabetes    . Coronary artery disease    . Colon cancer Neg Hx       Prior to Admission medications   Medication Sig Start Date End Date Taking? Authorizing Provider  albuterol (PROVENTIL HFA;VENTOLIN HFA) 108 (90 Base) MCG/ACT inhaler Inhale 2 puffs into the lungs every 4 (four) hours as needed for wheezing or shortness of breath. 11/28/15  Yes Nishant Dhungel, MD  amiodarone (PACERONE) 200 MG tablet Take 0.5 tablets (100 mg total) by mouth daily. 11/17/15  Yes Dorothy Spark, MD  aspirin EC 81 MG EC tablet Take 1 tablet (81 mg total) by mouth daily. 08/16/14  Yes Hosie Poisson, MD  atorvastatin (LIPITOR) 40 MG tablet TAKE 1 TABLET (40 MG TOTAL) BY MOUTH EVERY EVENING. 01/09/16  Yes Dorothy Spark, MD  Diclofenac Sodium 1 % CREA Place 1 application onto the skin 2 (two) times daily. Patient taking differently: Place 1 application onto the skin 2 (two) times daily as needed (pain).  09/29/15  Yes Marletta Lor, MD  diltiazem (CARDIZEM CD) 120 MG 24 hr capsule TAKE ONE CAPSULE BY MOUTH DAILY 12/24/15  Yes Dorothy Spark, MD  ELIQUIS 5 MG TABS tablet TAKE 1 TABLET (5 MG TOTAL) BY MOUTH 2 (TWO) TIMES DAILY. 11/26/15  Yes Dorothy Spark, MD  furosemide (LASIX) 20 MG tablet Take 1 tablet (20 mg total) by mouth daily. 11/04/14  Yes Dorothy Spark, MD  gabapentin (NEURONTIN) 600 MG tablet TAKE 1 TABLET (600 MG TOTAL) BY MOUTH 3 (THREE) TIMES DAILY. 10/27/15  Yes Marletta Lor, MD  guaiFENesin-dextromethorphan [facility] DM) 100-10 MG/5ML syrup Take 5 mLs by mouth every 4 (four) hours as needed for cough. 11/28/15  Yes Nishant Dhungel, MD  LORazepam (ATIVAN) 0.5 MG tablet TAKE ONE TABLET BY MOUTH TWICE DAILY 12/30/15  Yes Marletta Lor,  MD  metFORMIN (GLUCOPHAGE) 500 MG tablet TAKE 1 TABLET (500 MG TOTAL) BY MOUTH 2 (TWO) TIMES DAILY WITH A MEAL. 11/11/15  Yes Marletta Lor, MD  metoprolol tartrate (LOPRESSOR) 25 MG tablet Take 1 tablet (25 mg total) by mouth 2 (two) times daily. 02/18/15  Yes Dorothy Spark, MD  Multiple Vitamins-Minerals (CENTRUM SILVER ULTRA WOMENS PO) Take 1 capsule by mouth daily.    Yes Historical Provider, MD  nitroGLYCERIN (NITROSTAT) 0.4 MG SL tablet Place 0.4 mg under the tongue every 5 (five) minutes as needed for chest pain.   Yes Historical Provider, MD  potassium chloride (K-DUR,KLOR-CON) 10 MEQ tablet TAKE ONE TABLET BY MOUTH DAILY 12/17/15  Yes  Marletta Lor, MD  predniSONE (DELTASONE) 20 MG tablet Take 1 tablet (20 mg total) by mouth daily with breakfast. 11/28/15  Yes Nishant Dhungel, MD  promethazine (PHENERGAN) 25 MG tablet TAKE 1 TABLET BY MOUTH EVERY 6 HOURS AS NEEDED FOR NAUSEA OR VOMITING 10/17/15  Yes Marletta Lor, MD  sertraline (ZOLOFT) 50 MG tablet Take 25 mg by mouth daily.   Yes Historical Provider, MD  SPIRIVA HANDIHALER 18 MCG inhalation capsule PLACE 1 CAPSULE (18 MCG TOTAL) INTO INHALER AND INHALE DAILY. 05/13/15  Yes Marletta Lor, MD  triamcinolone cream (KENALOG) 0.1 % Apply 1 application topically daily as needed (affected area).  09/26/15  Yes Historical Provider, MD    Physical Exam: Filed Vitals:   01/25/2016 2330 01/15/16 0000 01/15/16 0007 01/15/16 0030  BP: 145/45 146/50  127/43  Pulse: 51 52  47  Temp:   97.8 F (36.6 C)   TempSrc:      Resp: 15 13  15   SpO2: 92% 91%  94%     General:  Moderately built and nourished.  Eyes: Anicteric no pallor.  ENT: Small lump in the right forehead.  Neck: No neck rigidity. No mass felt.  Cardiovascular: S1 and S2 heard.  Respiratory: No rhonchi or crepitations.  Abdomen: Soft nontender bowel sounds present.  Skin: No rash.  Musculoskeletal: No restriction of moving the lower  extremities.  Psychiatric: Appears normal.  Neurologic: Alert awake oriented to time place and person. Moves all extremities 5 x 5. No facial asymmetry. Tongue is midline.  Labs on Admission:  Basic Metabolic Panel:  Recent Labs Lab 01/03/2016 1944  NA 138  K 4.8  CL 101  CO2 23  GLUCOSE 129*  BUN 32*  CREATININE 1.64*  CALCIUM 8.7*   Liver Function Tests: No results for input(s): AST, ALT, ALKPHOS, BILITOT, PROT, ALBUMIN in the last 168 hours. No results for input(s): LIPASE, AMYLASE in the last 168 hours. No results for input(s): AMMONIA in the last 168 hours. CBC:  Recent Labs Lab 01/15/2016 1944  WBC 10.0  HGB 8.3*  HCT 28.6*  MCV 79.2  PLT 210   Cardiac Enzymes: No results for input(s): CKTOTAL, CKMB, CKMBINDEX, TROPONINI in the last 168 hours.  BNP (last 3 results)  Recent Labs  11/27/15 1140  BNP 540.4*    ProBNP (last 3 results)  Recent Labs  05/21/15 0835  PROBNP 140.0*    CBG: No results for input(s): GLUCAP in the last 168 hours.  Radiological Exams on Admission: Dg Chest 2 View  01/08/2016  CLINICAL DATA:  Weakness and shortness of breath for about 1 week. EXAM: CHEST  2 VIEW COMPARISON:  11/27/2015.  08/18/2015. FINDINGS: The lungs are clear wiithout focal pneumonia, edema, pneumothorax or pleural effusion. Interstitial markings are diffusely coarsened with chronic features. Stable appearance of basilar interstitial scarring, right greater than left. The cardio pericardial silhouette is enlarged. Patient is status post CABG. The visualized bony structures of the thorax are intact. Bones are diffusely demineralized. IMPRESSION: Stable exam. Cardiomegaly with chronic underlying interstitial lung disease. Prominence of the interstitial markings at the right base unchanged. Electronically Signed   By: Misty Stanley M.D.   On: 01/13/2016 20:26   Dg Hip Unilat  With Pelvis 2-3 Views Right  01/26/2016  CLINICAL DATA:  75 year old female with fall and  right hip pain. EXAM: DG HIP (WITH OR WITHOUT PELVIS) 2-3V RIGHT COMPARISON:  None. FINDINGS: There is no acute fracture or dislocation. There is mild osteopenia.  There is degenerative changes of the lower lumbar spine. Moderate stool throughout the colon. Soft tissues appear unremarkable. Multiple surgical clips noted over the right hip. IMPRESSION: No acute fracture or dislocation. Electronically Signed   By: Anner Crete M.D.   On: 01/02/2016 20:27    EKG: Independently reviewed. Sinus bradycardia.  Assessment/Plan Principal Problem:   Generalized weakness Active Problems:   Essential hypertension   PVD, s/p AOBF '04, Lt SCA BPG 9/05   Diabetes mellitus type 2, controlled ([place])   PAF (paroxysmal atrial fibrillation) (HCC)   Weakness   1. Generalized weakness - appears nonfocal at this time. Will check orthostatic vital signs. Patient is bradycardic and if so bradycardic I will hold off patient's Cardizem and beta blocker. She is on amiodarone. Posterior marker in telemetry. Patient also has had slurred speech CT head is pending but on exam in the ER patient was nonfocal. Patient cannot get regular MRI and wants only open MRI if required. We'll check influenza PCR. Physical therapy consult. 2. CAD status post CABG - denies any chest pain. Cycle cardiac markers due to weakness. 3. Diabetes mellitus type 2 - on insulin sliding scale. Hold metformin while inpatient. 4. History of paroxysmal atrial fibrillation - presently in sinus bradycardia. Holding off on beta blockers and Cardizem if still bradycardic. Check TSH. On amiodarone. On Apixaban. Chads 2 vasc score was more than 2. 5. Chronic anemia - follow CBC.   DVT Prophylaxis Apixaban.  Code Status: Full code.  Family Communication: Patient's daughter.  Disposition Plan: Admit for observation.    KAKRAKANDY,ARSHAD N. [facility] Pager [phone].  If 7PM-7AM, please contact night-coverage [email or web address] Password  [email or web address] 01/15/2016, 1:16 AM

## 2016-01-15 NOTE — ED Notes (Signed)
Notified admitting physician, Dr. Hal Hope, that patient's heart rate has been in the 40's. MD aware. Advised to hold Metoprolol and Diltiazem - per MD order.

## 2016-01-16 ENCOUNTER — Observation Stay (HOSPITAL_COMMUNITY): Payer: Medicare Other

## 2016-01-16 DIAGNOSIS — E1165 Type 2 diabetes mellitus with hyperglycemia: Secondary | ICD-10-CM | POA: Diagnosis not present

## 2016-01-16 DIAGNOSIS — J209 Acute bronchitis, unspecified: Secondary | ICD-10-CM

## 2016-01-16 DIAGNOSIS — I480 Paroxysmal atrial fibrillation: Secondary | ICD-10-CM | POA: Diagnosis not present

## 2016-01-16 DIAGNOSIS — I10 Essential (primary) hypertension: Secondary | ICD-10-CM | POA: Diagnosis not present

## 2016-01-16 DIAGNOSIS — R531 Weakness: Secondary | ICD-10-CM | POA: Diagnosis not present

## 2016-01-16 DIAGNOSIS — J441 Chronic obstructive pulmonary disease with (acute) exacerbation: Secondary | ICD-10-CM

## 2016-01-16 LAB — GLUCOSE, CAPILLARY
Glucose-Capillary: 110 mg/dL — ABNORMAL HIGH (ref 65–99)
Glucose-Capillary: 164 mg/dL — ABNORMAL HIGH (ref 65–99)
Glucose-Capillary: 208 mg/dL — ABNORMAL HIGH (ref 65–99)
Glucose-Capillary: 163 mg/dL — ABNORMAL HIGH (ref 65–99)

## 2016-01-16 MED ORDER — DOXYCYCLINE HYCLATE 100 MG PO TABS
100.0000 mg | ORAL_TABLET | Freq: Two times a day (BID) | ORAL | Status: AC
  Administered 2016-01-16 – 2016-01-24 (×17): 100 mg via ORAL
  Filled 2016-01-16 (×17): qty 1

## 2016-01-16 MED ORDER — BUDESONIDE 0.25 MG/2ML IN SUSP
0.2500 mg | Freq: Two times a day (BID) | RESPIRATORY_TRACT | Status: DC
  Administered 2016-01-16 – 2016-01-21 (×11): 0.25 mg via RESPIRATORY_TRACT
  Filled 2016-01-16 (×11): qty 2

## 2016-01-16 MED ORDER — FUROSEMIDE 10 MG/ML IJ SOLN
20.0000 mg | Freq: Once | INTRAMUSCULAR | Status: AC
  Administered 2016-01-16: 20 mg via INTRAVENOUS
  Filled 2016-01-16: qty 2

## 2016-01-16 MED ORDER — ALBUTEROL SULFATE (2.5 MG/3ML) 0.083% IN NEBU
2.5000 mg | INHALATION_SOLUTION | RESPIRATORY_TRACT | Status: DC | PRN
  Administered 2016-01-18 – 2016-01-21 (×3): 2.5 mg via RESPIRATORY_TRACT
  Filled 2016-01-16 (×3): qty 3

## 2016-01-16 MED ORDER — SERTRALINE HCL 50 MG PO TABS: 25.0000 mg | ORAL_TABLET | Freq: Every day | ORAL | Status: AC

## 2016-01-16 MED ORDER — IPRATROPIUM BROMIDE 0.02 % IN SOLN
0.5000 mg | Freq: Four times a day (QID) | RESPIRATORY_TRACT | Status: DC
  Administered 2016-01-16 – 2016-01-17 (×3): 0.5 mg via RESPIRATORY_TRACT
  Filled 2016-01-16 (×3): qty 2.5

## 2016-01-16 MED ORDER — PREDNISONE 20 MG PO TABS
40.0000 mg | ORAL_TABLET | Freq: Two times a day (BID) | ORAL | Status: DC
  Administered 2016-01-16 – 2016-01-17 (×2): 40 mg via ORAL
  Filled 2016-01-16 (×2): qty 2

## 2016-01-16 NOTE — Progress Notes (Signed)
Placed patient on 55% venturi mask due to Sp02 of 84-86% on 5lpm. Nurse notified of increase in oxygen ,will continue to monitor patient closely

## 2016-01-16 NOTE — Progress Notes (Addendum)
[facility] PROGRESS NOTE  [name] [id number] DOB: [birth year] DOA: 01/27/2016 PCP: Nyoka Cowden, MD  Assessment/Plan: 1-acute on chronic respiratory failure: secondary to COPD exacerbation, bronchitis and influenza -Will start doxycycline 100 mg twice a day and also Pulmicort nebulizer treatment -Patient on prednisone 40 mg twice a day -Continue as needed albuterol/Atrovent -Continue treatment with Tamiflu -Started on flutter valve -Continue oxygen supplementation and follow clinical response  2-generalized weakness and myalgia: Secondary to influenza -Physical therapy to assess patient once medically stable  3-coronary artery disease status post CABG: Currently without chest pain -Mild elevation of her troponins appears to be secondary to demand ischemia -EKG without acute ischemic changes  -Will continue monitoring on telemetry   4-history of paroxysmal atrial fibrillation: Presented on bradycardia and sinus currently. We'll order hold beta blockers, Cardizem and also amiodarone (last one secondary to transaminitis).  -Continue monitoring on telemetry and continue apixaban  5-chronic anemia: Hemoglobin currently stable and at her baseline  -Will follow trend  -Currently in no need for transfusion   6-chronic diastolic heart failure: Compensated  -Will resume Lasix on 01/17/2016   7-depression/anxiety: Continue Zoloft   8-hyperlipidemia: Holding Lipitor secondary to transaminitis  9-transaminitis: Unclear etiology at this moment -Will hold amiodarone for today and discontinue statins for now -Will check CMET in a.m.  10-Diabetes with hyperglycemia -secondary to steroids  -continue SSI  Code Status: Full code Family Communication: No family at bedside Disposition Plan: To be determined. Patient currently struggling with difficulty breathing and unable to speak in full sentences. physical therapy to be ordered once medically stable.     Consultants:  None  Procedures:  See below for x-ray reports  Antibiotics:  Doxycycline 01/16/16  Tamiflu 01/16/16  HPI/Subjective: Currently afebrile; but with low grade fever overnight. Patient still with ongoing difficulty breathing and difficulty speaking in full sentences. Diffuse wheezing on exam. She turned out to be positive for influenza by PCR  Objective: Filed Vitals:   01/16/16 1402 01/16/16 1527  BP: 80/47 102/54  Pulse: 83   Temp: 98.9 F (37.2 C)   Resp: 20     Intake/Output Summary (Last 24 hours) at 01/16/16 1718 Last data filed at 01/16/16 0601  Gross per 24 hour  Intake    525 ml  Output      0 ml  Net    525 ml   Filed Weights   01/16/16 0406  Weight: 68.13 kg (150 lb 3.2 oz)    Exam:   General:  Afebrile currently, patient denies chest pain but is still with significant shortness of breath and difficulty speaking in full sentences.    Cardiovascular: S1 and S2, no rubs, no gallop; rate control  Respiratory: Diffuse wheezing, scattered rhonchi, no crackles; poor air movement.  Abdomen: Soft, nontender, nondistended, positive bowel sounds  Musculoskeletal: No edema, no cyanosis or clubbing  Data Reviewed: Basic Metabolic Panel:  Recent Labs Lab 01/09/2016 1944 01/15/16 1348  NA 138 138  K 4.8 4.0  CL 101 104  CO2 23 22  GLUCOSE 129* 145*  BUN 32* 28*  CREATININE 1.64* 1.37*  CALCIUM 8.7* 8.4*   Liver Function Tests:  Recent Labs Lab 01/15/16 1348  AST 1078*  ALT 595*  ALKPHOS 95  BILITOT 0.6  PROT 5.9*  ALBUMIN 2.9*   CBC:  Recent Labs Lab 01/20/2016 1944 01/15/16 1348  WBC 10.0 8.4  HGB 8.3* 8.0*  HCT 28.6* 27.4*  MCV 79.2 78.7  PLT 210 165   Cardiac Enzymes:  Recent Labs Lab 01/15/16 0822 01/15/16 1348 01/15/16 1542 01/15/16 1940 01/15/16 2213  CKTOTAL  --  388*  --   --   --   TROPONINI 0.10* 0.12* 0.11* 0.10* 0.08*   BNP (last 3 results)  Recent Labs  11/27/15 1140  BNP 540.4*     ProBNP (last 3 results)  Recent Labs  05/21/15 0835  PROBNP 140.0*    CBG:  Recent Labs Lab 01/15/16 1806 01/15/16 2205 01/16/16 0803 01/16/16 1207 01/16/16 1657  GLUCAP 153* 144* 110* 164* 208*   Studies: Dg Chest 2 View  01/16/2016  CLINICAL DATA:  COPD, cough, shortness of Breath EXAM: CHEST  2 VIEW COMPARISON:  01/02/2016 FINDINGS: Worsening interstitial prominence and reticulonodular opacities in the lungs bilaterally. Heart is normal size. Prior CABG. Trace left pleural effusion. No acute bony abnormality. IMPRESSION: Worsening interstitial prominence with reticulonodular densities in the mid and lower lung zones. Cannot exclude pneumonia. Trace left pleural effusion. Electronically Signed   By: Rolm Baptise M.D.   On: 01/16/2016 13:16   Dg Chest 2 View  01/11/2016  CLINICAL DATA:  Weakness and shortness of breath for about 1 week. EXAM: CHEST  2 VIEW COMPARISON:  11/27/2015.  08/18/2015. FINDINGS: The lungs are clear wiithout focal pneumonia, edema, pneumothorax or pleural effusion. Interstitial markings are diffusely coarsened with chronic features. Stable appearance of basilar interstitial scarring, right greater than left. The cardio pericardial silhouette is enlarged. Patient is status post CABG. The visualized bony structures of the thorax are intact. Bones are diffusely demineralized. IMPRESSION: Stable exam. Cardiomegaly with chronic underlying interstitial lung disease. Prominence of the interstitial markings at the right base unchanged. Electronically Signed   By: Misty Stanley M.D.   On: 01/25/2016 20:26   Ct Head Wo Contrast  01/15/2016  CLINICAL DATA:  Generalize weakness, fatigue, weight gain, and altered mental status. Fall. EXAM: CT HEAD WITHOUT CONTRAST TECHNIQUE: Contiguous axial images were obtained from the base of the skull through the vertex without intravenous contrast. COMPARISON:  11/27/2015 FINDINGS: Mild diffuse cerebral atrophy. No ventricular  dilatation. Low-attenuation changes in the deep white matter consistent small vessel ischemia. Cortical calcifications likely dystrophic. No mass effect or midline shift. No abnormal extra-axial fluid collections. Gray-white matter junctions are distinct. Basal cisterns are not effaced. No evidence of acute intracranial hemorrhage. No depressed skull fractures. Visualized paranasal sinuses and mastoid air cells are not opacified. Vascular calcifications. IMPRESSION: No acute intracranial abnormalities. Chronic atrophy and small vessel ischemic changes. Electronically Signed   By: Lucienne Capers M.D.   On: 01/15/2016 04:22   Dg Hip Unilat  With Pelvis 2-3 Views Right  01/05/2016  CLINICAL DATA:  75 year old female with fall and right hip pain. EXAM: DG HIP (WITH OR WITHOUT PELVIS) 2-3V RIGHT COMPARISON:  None. FINDINGS: There is no acute fracture or dislocation. There is mild osteopenia. There is degenerative changes of the lower lumbar spine. Moderate stool throughout the colon. Soft tissues appear unremarkable. Multiple surgical clips noted over the right hip. IMPRESSION: No acute fracture or dislocation. Electronically Signed   By: Anner Crete M.D.   On: 01/22/2016 20:27    Scheduled Meds: . albuterol  2.5 mg Nebulization Q6H  . amiodarone  100 mg Oral Daily  . antiseptic oral rinse  7 mL Mouth Rinse BID  . apixaban  5 mg Oral BID  . aspirin EC  81 mg Oral Daily  . atorvastatin  40 mg Oral q1800  . budesonide (PULMICORT) nebulizer solution  0.25 mg Nebulization  BID  . gabapentin  600 mg Oral TID  . hydrALAZINE  10 mg Oral 3 times per day  . insulin aspart  0-9 Units Subcutaneous TID WC  . ipratropium  0.5 mg Nebulization Q6H  . LORazepam  0.5 mg Oral BID  . oseltamivir  30 mg Oral BID  . potassium chloride  10 mEq Oral Daily  . predniSONE  40 mg Oral BID WC  . sertraline  25 mg Oral Daily   Continuous Infusions:   Principal Problem:   Generalized weakness Active Problems:    Essential hypertension   PVD, s/p AOBF '04, Lt SCA BPG 9/05   Diabetes mellitus type 2, controlled ([place])   PAF (paroxysmal atrial fibrillation) ([place])   Weakness    Time spent: 35 minutes    Barton Dubois  [facility] Pager [phone]. If 7PM-7AM, please contact night-coverage at [email or web address], password [facility] 01/16/2016, 5:18 PM  LOS: 1 day

## 2016-01-16 NOTE — Care Management Obs Status (Signed)
[place] NOTIFICATION   Patient Details  Name: [name] MRN: [id number] Date of Birth: 14-Jun-1941   Medicare Observation Status Notification Given:  Yes    Sharin Mons, RN 01/16/2016, 11:37 AM

## 2016-01-16 NOTE — Discharge Instructions (Signed)

## 2016-01-16 NOTE — Addendum Note (Signed)
Addended by: Marian Sorrow on: 01/16/2016 03:39 PM   Modules accepted: Orders

## 2016-01-16 NOTE — Care Management Note (Addendum)
Case Management Note  Patient Details  Name: MICHALAH ABADILLA MRN: [id number] Date of Birth: [birth year]  Subjective/Objective:                 Admitted with generalized weakness, history of paroxysmal atrial fibrillation, CAD status post CABG, COPD, hypertension and diabetes mellitus type 2.  From home alone. Independent with ADL's PTA. No DME: HOME 02 2-3L.    Action/Plan: Return to home when medically stable. CM to f/u with d/c needs.  Expected Discharge Date:                  Expected Discharge Plan:  Home/Self Care  In-House Referral:     Discharge planning Services  CM Consult  Post Acute Care Choice:  Resumption of Svcs/PTA Provider Choice offered to:     DME Arranged:  Oxygen DME Agency:  [place]:    [place] Agency:     Status of Service:  In process, will continue to follow  Medicare Important Message Given:    Date Medicare IM Given:    Medicare IM give by:    Date Additional Medicare IM Given:    Additional Medicare Important Message give by:     If discussed at [place] of Stay Meetings, dates discussed:    Additional Comments: Edwena Felty (Daughter) [phone]  [place], [place] [phone] 01/16/2016, 4:58 PM

## 2016-01-16 NOTE — Progress Notes (Signed)
Pts saturation is still 85-88% on 4L. Made Dr Dyann Kief aware. Will continue to monitor pt frequently. Called Radiology regarding CXR. Bed remains in lowest position and call bell is within reach.

## 2016-01-16 NOTE — Progress Notes (Signed)
PT Cancellation Note  Patient Details Name: [name] MRN: [id number] DOB: [birth year]   Cancelled Treatment:    Reason Eval/Treat Not Completed: Medical issues which prohibited therapy. Pt SpO2 87% on 4L when checked on earlier.   Murray Durrell 01/16/2016, 1:52 PM [place]

## 2016-01-16 NOTE — Progress Notes (Signed)
Pt c/o sob. O2 sat 84-85% on 4L O2. RT notified and on the way to see pt. Dr Dyann Kief also notified.

## 2016-01-16 NOTE — Telephone Encounter (Signed)
Tried to contact pt again, no answer. Called and spoke to daughter Freda Munro she said pt has been admitted to the hospital. Told her okay I was trying to get in touch with her to verify dose of Zoloft. Freda Munro said she has a 50 mg tablet and cuts in half and only takes 25 mg. Told her okay will send Rx to pharmacy and can pickup when she is out of the hospital. Freda Munro verbalized understanding.

## 2016-01-17 DIAGNOSIS — R531 Weakness: Secondary | ICD-10-CM | POA: Diagnosis not present

## 2016-01-17 LAB — COMPREHENSIVE METABOLIC PANEL
ALK PHOS: 75 U/L (ref 38–126)
ALT: 413 U/L — AB (ref 14–54)
ANION GAP: 10 (ref 5–15)
AST: 331 U/L — ABNORMAL HIGH (ref 15–41)
Albumin: 2.5 g/dL — ABNORMAL LOW (ref 3.5–5.0)
BILIRUBIN TOTAL: 1 mg/dL (ref 0.3–1.2)
BUN: 15 mg/dL (ref 6–20)
CHLORIDE: 107 mmol/L (ref 101–111)
CO2: 27 mmol/L (ref 22–32)
CREATININE: 1.07 mg/dL — AB (ref 0.44–1.00)
Calcium: 8.5 mg/dL — ABNORMAL LOW (ref 8.9–10.3)
GFR calc Af Amer: 58 mL/min — ABNORMAL LOW (ref >60)
GFR, EST NON AFRICAN AMERICAN: 50 mL/min — AB (ref >60)
GLUCOSE: 170 mg/dL — AB (ref 65–99)
Potassium: 3.1 mmol/L — ABNORMAL LOW (ref 3.5–5.1)
SODIUM: 144 mmol/L (ref 135–145)
Total Protein: 5.6 g/dL — ABNORMAL LOW (ref 6.5–8.1)

## 2016-01-17 LAB — GLUCOSE, CAPILLARY
GLUCOSE-CAPILLARY: 97 mg/dL (ref 65–99)
GLUCOSE-CAPILLARY: 209 mg/dL — AB (ref 65–99)
Glucose-Capillary: 191 mg/dL — ABNORMAL HIGH (ref 65–99)
Glucose-Capillary: 197 mg/dL — ABNORMAL HIGH (ref 65–99)

## 2016-01-17 MED ORDER — IPRATROPIUM-ALBUTEROL 0.5-2.5 (3) MG/3ML IN SOLN
3.0000 mL | Freq: Four times a day (QID) | RESPIRATORY_TRACT | Status: DC
  Administered 2016-01-17 – 2016-01-20 (×15): 3 mL via RESPIRATORY_TRACT
  Filled 2016-01-17 (×14): qty 3

## 2016-01-17 MED ORDER — POTASSIUM CHLORIDE CRYS ER 20 MEQ PO TBCR
40.0000 meq | EXTENDED_RELEASE_TABLET | Freq: Once | ORAL | Status: AC
  Administered 2016-01-17: 40 meq via ORAL
  Filled 2016-01-17: qty 2

## 2016-01-17 NOTE — Evaluation (Addendum)
Physical Therapy Evaluation Patient Details Name: [name] MRN: [id number] DOB: 03/11/1941 Today's Date: 01/17/2016   History of Present Illness  75 y.o. female with history of paroxysmal atrial fibrillation, CAD status post CABG, COPD, hypertension and diabetes mellitus type 2. She was brought to the ER by patient's daughter due to weakness, decreased O2 sats, and fall at home. Pt admitted for acute on chronic respiratory failure due to COPD exacerbation, bronchitis, and influenza.   Clinical Impression  Pt admitted with above diagnosis. Pt currently with functional limitations due to the deficits listed below (see PT Problem List). Eval limited by respiratory complications. Pt had O2 mask off when PT entered room. VM replaced with O2 at 15 L. Pt able to ambulate in room with continuous O2 and RW. She required supervision for bed mobility, min assist for transfers, and min guard assist with gait 20 feet. O2 sats remained in upper 80s during mobility with VM in place. Pt again removed mask as PT was exiting room requiring cues to replace. RN made aware. Pt will benefit from skilled PT to increase their independence and safety with mobility to allow discharge to the venue listed below.  As acute illness resolves, pt should quickly progress with mobility. She has a RW at home, if it is still needed at time of d/c.      Follow Up Recommendations No PT follow up;Supervision/Assistance - 24 hour (recommend 24-hour family assist upon inital d/c home.)    Equipment Recommendations  None recommended by PT    Recommendations for Other Services       Precautions / Restrictions Precautions Precautions: Fall;Other (comment) Precaution Comments: watch O2 sats Restrictions Weight Bearing Restrictions: No      Mobility  Bed Mobility Overal bed mobility: Needs Assistance Bed Mobility: Supine to Sit;Sit to Supine     Supine to sit: Supervision;HOB elevated Sit to supine: Supervision;HOB  elevated   General bed mobility comments: use of bed rails, supervision for safety  Transfers Overall transfer level: Needs assistance Equipment used: Ambulation equipment used Transfers: Sit to/from [employer] Transfers Sit to Stand: Min assist Stand pivot transfers: Min assist       General transfer comment: assist to power up and stabilize initial balance  Ambulation/Gait Ambulation/Gait assistance: Min guard Ambulation Distance (Feet): 20 Feet Assistive device: Rolling walker (2 wheeled) Gait Pattern/deviations: Step-through pattern;Decreased stride length Gait velocity: decreased Gait velocity interpretation: Below normal speed for age/gender General Gait Details: slow, steady gait. port O2 15 L via mask  Stairs            Wheelchair Mobility    Modified Rankin (Stroke Patients Only)       Balance                                             Pertinent Vitals/Pain Pain Assessment: No/denies pain    Home Living Family/patient expects to be discharged to:: Private residence Living Arrangements: Alone Available Help at Discharge: Family;Available PRN/intermittently Type of Home: House Home Access: Stairs to enter Entrance Stairs-Rails: Right;Left;Can reach both Entrance Stairs-Number of Steps: 3 Home Layout: One level Home Equipment: Walker - 2 wheels;Grab bars - tub/shower      Prior Function Level of Independence: Independent         Comments: still working part time, drives, active     Hand Dominance   Dominant  Hand: Right    Extremity/Trunk Assessment   Upper Extremity Assessment: Generalized weakness           Lower Extremity Assessment: Generalized weakness      Cervical / Trunk Assessment: Normal  Communication   Communication: No difficulties  Cognition Arousal/Alertness: Awake/alert Behavior During Therapy: WFL for tasks assessed/performed Overall Cognitive Status: Within Functional Limits for  tasks assessed                      General Comments      Exercises        Assessment/Plan    PT Assessment Patient needs continued PT services  PT Diagnosis Generalized weakness;Difficulty walking   PT Problem List Decreased strength;Decreased activity tolerance;Decreased balance;Decreased mobility;Decreased knowledge of use of DME  PT Treatment Interventions DME instruction;Stair training;Gait training;Functional mobility training;Therapeutic activities;Therapeutic exercise;Patient/family education;Balance training   PT Goals (Current goals can be found in the Care Plan section) Acute Rehab PT Goals Patient Stated Goal: feel better and return home PT Goal Formulation: With patient Time For Goal Achievement: 01/31/16 Potential to Achieve Goals: Good    Frequency Min 3X/week   Barriers to discharge Decreased caregiver support      Co-evaluation               End of Session Equipment Utilized During Treatment: Gait belt;Oxygen Activity Tolerance: Patient limited by fatigue;Treatment limited secondary to medical complications (Comment) (O2 sats) Patient left: in bed;with call bell/phone within reach Nurse Communication: Mobility status;Other (comment) (Pt keeps removing O2 mask.)    Functional Assessment Tool Used: clinical judgement Functional Limitation: Mobility: Walking and moving around Mobility: Walking and Moving Around Current Status [phone]): At least 1 percent but less than 20 percent impaired, limited or restricted Mobility: Walking and Moving Around Goal Status [phone]): At least 1 percent but less than 20 percent impaired, limited or restricted    Time: 1127-1150 PT Time Calculation (min) (ACUTE ONLY): 23 min   Charges:   PT Evaluation $PT Eval Moderate Complexity: 1 Procedure PT Treatments $Therapeutic Activity: 8-22 mins   PT G Codes:   PT G-Codes **NOT FOR INPATIENT CLASS** Functional Assessment Tool Used: clinical judgement Functional  Limitation: Mobility: Walking and moving around Mobility: Walking and Moving Around Current Status [id number]): At least 1 percent but less than 20 percent impaired, limited or restricted Mobility: Walking and Moving Around Goal Status [phone]): At least 1 percent but less than 20 percent impaired, limited or restricted    Lorriane Shire 01/17/2016, 12:13 PM

## 2016-01-17 NOTE — Progress Notes (Signed)
Pt educated on importance of keeping Venti mask on face. Pt verbalized understanding. Will  Continue to monitor pt frequently throughout day.

## 2016-01-17 NOTE — Progress Notes (Signed)
Asked to see Patient per Respiratory Therapist Kathrynn Running regarding increased 02 need. Pt found resting in bed in no distress. Lung sound mildly congested in bilateral upper lobes, diminished bases. Po2 95-97% on VM 55%. VSS, see flow sheet. Suggested RN page [facility] to update on po2 need tonight. Advised RN to monitor Pt closely and wean 02 as tolerated.

## 2016-01-17 NOTE — Progress Notes (Signed)
[facility] PROGRESS NOTE  [name] [id number] DOB: 29-May-1941 DOA: 12/31/2015 PCP: Nyoka Cowden, MD  Assessment/Plan: 1-acute on chronic respiratory failure: secondary to COPD exacerbation, bronchitis and influenza -Continue doxycycline 100 mg twice a day and also Pulmicort nebulizer treatment -No wheezes and in lieu of active infection will discontinue prednisone -Continue as needed albuterol/Atrovent -Continue treatment with Tamiflu -Started on flutter valve -Continue oxygen supplementation and follow clinical response  2-generalized weakness and myalgia: Secondary to influenza -Physical therapy to assess patient once medically stable  3-coronary artery disease status post CABG: Currently without chest pain -Mild elevation of her troponins appears to be secondary to demand ischemia -EKG without acute ischemic changes  -Will continue monitoring on telemetry   4-history of paroxysmal atrial fibrillation: Presented on bradycardia and sinus currently. We'll order hold beta blockers, Cardizem and also amiodarone (last one secondary to transaminitis).  -Continue monitoring on telemetry and continue apixaban  5-chronic anemia: Hemoglobin currently stable and at her baseline  -Will follow trend  -Currently in no need for transfusion   6-chronic diastolic heart failure: Compensated  -Will resume Lasix on 01/18/2016, will replenish K levels first  7-depression/anxiety: Continue Zoloft   8-hyperlipidemia: Holding Lipitor secondary to transaminitis  9-transaminitis: Unclear etiology at this moment -Will hold amiodarone for today and discontinue statins for now -Will check CMET in a.m.  10-Diabetes with hyperglycemia -secondary to steroids  -continue SSI  11. Hypokalemia - Replace and reassess  Code Status: Full code Family Communication: No family at bedside Disposition Plan: To be determined. Patient currently struggling with difficulty breathing  and unable to speak in full sentences. physical therapy to be ordered once medically stable.    Consultants:  None  Procedures:  See below for x-ray reports  Antibiotics:  Doxycycline 01/16/16  Tamiflu 01/16/16  HPI/Subjective: Pt has no new complaints. No acute issues reported overnight.  Objective: Filed Vitals:   01/17/16 0410 01/17/16 1402  BP: 139/44 112/48  Pulse: 66 73  Temp: 98.3 F (36.8 C) 98.8 F (37.1 C)  Resp: 18 16    Intake/Output Summary (Last 24 hours) at 01/17/16 1516 Last data filed at 01/17/16 [id number]  Gross per 24 hour  Intake    100 ml  Output      0 ml  Net    100 ml   Filed Weights   01/16/16 0406 01/17/16 0346  Weight: 68.13 kg (150 lb 3.2 oz) 64.501 kg (142 lb 3.2 oz)    Exam:   General:  Pt in nad, alert and awake   Cardiovascular: S1 and S2, no rubs, no gallop; rate control  Respiratory: Diffuse wheezing, scattered rhonchi, no crackles; poor air movement.  Abdomen: Soft, nontender, nondistended, positive bowel sounds  Musculoskeletal: No edema, no cyanosis or clubbing  Data Reviewed: Basic Metabolic Panel:  Recent Labs Lab 01/08/2016 1944 01/15/16 1348 01/17/16 0558  NA 138 138 144  K 4.8 4.0 3.1*  CL 101 104 107  CO2 23 22 27   GLUCOSE 129* 145* 170*  BUN 32* 28* 15  CREATININE 1.64* 1.37* 1.07*  CALCIUM 8.7* 8.4* 8.5*   Liver Function Tests:  Recent Labs Lab 01/15/16 1348 01/17/16 0558  AST 1078* 331*  ALT 595* 413*  ALKPHOS 95 75  BILITOT 0.6 1.0  PROT 5.9* 5.6*  ALBUMIN 2.9* 2.5*   CBC:  Recent Labs Lab 01/02/2016 1944 01/15/16 1348  WBC 10.0 8.4  HGB 8.3* 8.0*  HCT 28.6* 27.4*  MCV 79.2 78.7  PLT 210 165  Cardiac Enzymes:  Recent Labs Lab 01/15/16 0822 01/15/16 1348 01/15/16 1542 01/15/16 1940 01/15/16 2213  CKTOTAL  --  388*  --   --   --   TROPONINI 0.10* 0.12* 0.11* 0.10* 0.08*   BNP (last 3 results)  Recent Labs  11/27/15 1140  BNP 540.4*    ProBNP (last 3  results)  Recent Labs  05/21/15 0835  PROBNP 140.0*    CBG:  Recent Labs Lab 01/16/16 1207 01/16/16 1657 01/16/16 2134 01/17/16 0948 01/17/16 1214  GLUCAP 164* 208* 163* 97 209*   Studies: Dg Chest 2 View  01/16/2016  CLINICAL DATA:  COPD, cough, shortness of Breath EXAM: CHEST  2 VIEW COMPARISON:  01/04/2016 FINDINGS: Worsening interstitial prominence and reticulonodular opacities in the lungs bilaterally. Heart is normal size. Prior CABG. Trace left pleural effusion. No acute bony abnormality. IMPRESSION: Worsening interstitial prominence with reticulonodular densities in the mid and lower lung zones. Cannot exclude pneumonia. Trace left pleural effusion. Electronically Signed   By: Rolm Baptise M.D.   On: 01/16/2016 13:16    Scheduled Meds: . amiodarone  100 mg Oral Daily  . antiseptic oral rinse  7 mL Mouth Rinse BID  . apixaban  5 mg Oral BID  . aspirin EC  81 mg Oral Daily  . budesonide (PULMICORT) nebulizer solution  0.25 mg Nebulization BID  . doxycycline  100 mg Oral Q12H  . gabapentin  600 mg Oral TID  . hydrALAZINE  10 mg Oral 3 times per day  . insulin aspart  0-9 Units Subcutaneous TID WC  . ipratropium-albuterol  3 mL Nebulization Q6H  . LORazepam  0.5 mg Oral BID  . oseltamivir  30 mg Oral BID  . potassium chloride  10 mEq Oral Daily  . predniSONE  40 mg Oral BID WC  . sertraline  25 mg Oral Daily   Continuous Infusions:   Principal Problem:   Generalized weakness Active Problems:   Essential hypertension   PVD, s/p AOBF '04, Lt SCA BPG 9/05   Diabetes mellitus type 2, controlled ([place])   PAF (paroxysmal atrial fibrillation) (HCC)   Weakness    Time spent: 35 minutes    Velvet Bathe  [facility] Pager [phone]. If 7PM-7AM, please contact night-coverage at [email or web address], password [email or web address] 01/17/2016, 3:16 PM  LOS: 2 days

## 2016-01-18 DIAGNOSIS — R531 Weakness: Secondary | ICD-10-CM | POA: Diagnosis not present

## 2016-01-18 LAB — COMPREHENSIVE METABOLIC PANEL
ALK PHOS: 70 U/L (ref 38–126)
ALT: 299 U/L — ABNORMAL HIGH (ref 14–54)
ANION GAP: 7 (ref 5–15)
AST: 179 U/L — ABNORMAL HIGH (ref 15–41)
Albumin: 2.3 g/dL — ABNORMAL LOW (ref 3.5–5.0)
BILIRUBIN TOTAL: 0.8 mg/dL (ref 0.3–1.2)
BUN: 17 mg/dL (ref 6–20)
CALCIUM: 8.7 mg/dL — AB (ref 8.9–10.3)
CO2: 25 mmol/L (ref 22–32)
Chloride: 109 mmol/L (ref 101–111)
Creatinine, Ser: 0.95 mg/dL (ref 0.44–1.00)
GFR calc non Af Amer: 58 mL/min — ABNORMAL LOW (ref >60)
Glucose, Bld: 125 mg/dL — ABNORMAL HIGH (ref 65–99)
Potassium: 3.9 mmol/L (ref 3.5–5.1)
SODIUM: 141 mmol/L (ref 135–145)
TOTAL PROTEIN: 5.2 g/dL — AB (ref 6.5–8.1)

## 2016-01-18 LAB — GLUCOSE, CAPILLARY
GLUCOSE-CAPILLARY: 138 mg/dL — AB (ref 65–99)
GLUCOSE-CAPILLARY: 142 mg/dL — AB (ref 65–99)
Glucose-Capillary: 96 mg/dL (ref 65–99)
Glucose-Capillary: 133 mg/dL — ABNORMAL HIGH (ref 65–99)

## 2016-01-18 MED ORDER — METOPROLOL TARTRATE 12.5 MG HALF TABLET
12.5000 mg | ORAL_TABLET | Freq: Two times a day (BID) | ORAL | Status: DC
  Administered 2016-01-18 – 2016-01-26 (×15): 12.5 mg via ORAL
  Filled 2016-01-18 (×17): qty 1

## 2016-01-18 MED ORDER — WHITE PETROLATUM GEL
Status: AC
  Administered 2016-01-18: 14:00:00
  Filled 2016-01-18: qty 1

## 2016-01-18 NOTE — Evaluation (Signed)
Occupational Therapy Evaluation Patient Details Name: [name] MRN: [id number] DOB: Jan 15, 1941 Today's Date: 01/18/2016    History of Present Illness 75 y.o. female with history of paroxysmal atrial fibrillation, CAD status post CABG, COPD, hypertension and diabetes mellitus type 2. She was brought to the ER by patient's daughter due to weakness, decreased O2 sats, and fall at home. Pt admitted for acute on chronic respiratory failure due to COPD exacerbation, bronchitis, and influenza.    Clinical Impression   This 75 yo female admitted with above presents to acute OT with deficits below affecting her ability to care for herself at an independent level as she was pta (even working at [employer] part time). I discussed by concern with her being able to care for herself alone at home post D/C without someone to A her 24/7 initially--she feels she will be able to manage. My primary and secondary recommendations are just below this. We will continue to follow.    Follow Up Recommendations  SNF;Other (comment) (pt relutant to this, if she refuses then [facility] with 24/7 S/prn at least the first 2-5 days)    Equipment Recommendations  None recommended by OT       Precautions / Restrictions Precautions Precautions: Fall Precaution Comments: watch O2 sats Restrictions Weight Bearing Restrictions: No      Mobility Bed Mobility               General bed mobility comments: Pt up in recliner upon arrival and left in recliner at end of session  Transfers Overall transfer level: Needs assistance Equipment used: Rolling walker (2 wheeled) (O2 at 6 liters with mobility) Transfers: Sit to/from [employer] Sit to Stand: Min guard Stand pivot transfers: Min guard            Balance Overall balance assessment: Needs assistance Sitting-balance support: Feet supported;No upper extremity supported Sitting balance-Leahy Scale: Good     Standing balance  support: Single extremity supported Standing balance-Leahy Scale: Poor Standing balance comment: Requires at least one hand on something in standing. One LOB with me with RW that required me to help her correct                            ADL Overall ADL's : Needs assistance/impaired Eating/Feeding: Independent;Sitting   Grooming: Set up;Sitting;Supervision/safety   Upper Body Bathing: Set up;Sitting;Supervision/ safety   Lower Body Bathing: Min guard;Sit to/from stand   Upper Body Dressing : Set up;Supervision/safety;Sitting   Lower Body Dressing: Min guard;Sit to/from stand   Toilet Transfer: Min guard;Ambulation;RW;Regular Toilet;Grab bars   Toileting- Clothing Manipulation and Hygiene: Min guard;Sit to/from stand         General ADL Comments: All activities take increased time due to increased work of breathing and need to stop and do purse lipped breating (which I educated pt on). Pt reports that there is no one that can be with her 24/7 for the first few days (2-5) but feels she can handle her basic self care without A  (even though she said she just ambuating with me 162ft was a work out). I told her I felt she would benefit from a rehab stay piror to home, she is reluctant.               Pertinent Vitals/Pain Pain Assessment: No/denies pain     Hand Dominance Right   Extremity/Trunk Assessment Upper Extremity Assessment Upper Extremity Assessment: Generalized weakness  Communication Communication Communication: No difficulties   Cognition Arousal/Alertness: Awake/alert Behavior During Therapy: WFL for tasks assessed/performed Overall Cognitive Status: Within Functional Limits for tasks assessed                                Home Living Family/patient expects to be discharged to:: Private residence Living Arrangements: Alone Available Help at Discharge: Family;Available PRN/intermittently Type of Home: House Home  Access: Stairs to enter [employer] of Steps: 3 Entrance Stairs-Rails: Right;Left;Can reach both Home Layout: One level     Bathroom Shower/Tub: Tub/shower unit (usually takes tub baths) Shower/tub characteristics: Architectural technologist: Standard     Home Equipment: Environmental consultant - 2 wheels;Grab bars - tub/shower          Prior Functioning/Environment Level of Independence: Independent        Comments: still working part time, drives, active    OT Diagnosis: Generalized weakness   OT Problem List: Decreased strength;Cardiopulmonary status limiting activity;Impaired balance (sitting and/or standing)   OT Treatment/Interventions: Self-care/ADL training;Patient/family education;Balance training;Energy conservation;DME and/or AE instruction;Therapeutic activities    OT Goals(Current goals can be found in the care plan section) Acute Rehab OT Goals Patient Stated Goal: to be able to go home  OT Frequency: Min 3X/week   Barriers to D/C: Decreased caregiver support             End of Session Equipment Utilized During Treatment: Gait belt;Rolling walker;Oxygen (6 liters with mobility, 4 at rest) Nurse Communication:  (O2 dropped to 88 on 6 liters while ambulating)  Activity Tolerance:  (limited by increased work of breathing) Patient left: in chair;with call bell/phone within reach;with chair alarm set   Time: [id number] OT Time Calculation (min): 32 min Charges:  OT General Charges $OT Visit: 1 Procedure OT Evaluation $OT Eval Moderate Complexity: 1 Procedure OT Treatments $Self Care/Home Management : 8-22 mins G-Codes: OT G-codes **NOT FOR INPATIENT CLASS** Functional Assessment Tool Used: Clinical observation Functional Limitation: Self care Self Care Current Status [id number]): At least 1 percent but less than 20 percent impaired, limited or restricted Self Care Goal Status [id number]): At least 1 percent but less than 20 percent impaired, limited or restricted   Almon Register [id number] 01/18/2016, 1:27 PM

## 2016-01-18 NOTE — Progress Notes (Signed)
[facility] PROGRESS NOTE  [name] [id number] DOB: 03-11-1941 DOA: 01/19/2016 PCP: Nyoka Cowden, MD  Assessment/Plan: 1-acute on chronic respiratory failure: secondary to COPD exacerbation, bronchitis and influenza -Continue doxycycline 100 mg twice a day and also Pulmicort nebulizer treatment -No wheezes and in lieu of active infection will discontinue prednisone -Continue as needed albuterol/Atrovent -Continue current treatment with Tamiflu -Started on flutter valve  2-generalized weakness and myalgia: Secondary to influenza -Physical therapy to assess patient once medically stable  3-coronary artery disease status post CABG: Currently without chest pain -Mild elevation of her troponins appears to be secondary to demand ischemia -EKG without acute ischemic changes  -Will continue monitoring on telemetry   4-history of paroxysmal atrial fibrillation: Presented on bradycardia and sinus currently. We'll order hold beta blockers, Cardizem and also amiodarone (last one secondary to transaminitis).  -Continue monitoring on telemetry and continue apixaban  5-chronic anemia: Hemoglobin currently stable and at her baseline  -Will follow trend  -Currently in no need for transfusion   6-chronic diastolic heart failure: Compensated  -Will resume Lasix on 01/18/2016, will replenish K levels first  7-depression/anxiety: Continue Zoloft   8-hyperlipidemia: Holding Lipitor secondary to transaminitis  9-transaminitis: Unclear etiology at this moment -Will hold amiodarone for today and discontinue statins for now -trending down currently with improvement in condition - recheck next am.  10-Diabetes with hyperglycemia -secondary to steroids  -continue SSI  11. Hypokalemia - Replace and reassess  Code Status: Full code Family Communication: No family at bedside Disposition Plan: To be determined. Patient currently struggling with difficulty breathing and  unable to speak in full sentences. physical therapy to be ordered once medically stable.    Consultants:  None  Procedures:  See below for x-ray reports  Antibiotics:  Doxycycline 01/16/16  Tamiflu 01/16/16  HPI/Subjective: Pt has no new complaints. Is looking forward to going home soon.  Objective: Filed Vitals:   01/17/16 2338 01/18/16 0505  BP: 157/53 161/51  Pulse: 74 73  Temp:  98.2 F (36.8 C)  Resp:  16   No intake or output data in the 24 hours ending 01/18/16 1412 Filed Weights   01/16/16 0406 01/17/16 0346  Weight: 68.13 kg (150 lb 3.2 oz) 64.501 kg (142 lb 3.2 oz)    Exam:   General:  Pt in nad, alert and awake   Cardiovascular: S1 and S2, no rubs, no gallop; rate control  Respiratory: Diffuse wheezing, scattered rhonchi, no crackles; poor air movement.  Abdomen: Soft, nontender, nondistended, positive bowel sounds  Musculoskeletal: No edema, no cyanosis or clubbing  Data Reviewed: Basic Metabolic Panel:  Recent Labs Lab 01/16/2016 1944 01/15/16 1348 01/17/16 0558 01/18/16 0544  NA 138 138 144 141  K 4.8 4.0 3.1* 3.9  CL 101 104 107 109  CO2 23 22 27 25   GLUCOSE 129* 145* 170* 125*  BUN 32* 28* 15 17  CREATININE 1.64* 1.37* 1.07* 0.95  CALCIUM 8.7* 8.4* 8.5* 8.7*   Liver Function Tests:  Recent Labs Lab 01/15/16 1348 01/17/16 0558 01/18/16 0544  AST 1078* 331* 179*  ALT 595* 413* 299*  ALKPHOS 95 75 70  BILITOT 0.6 1.0 0.8  PROT 5.9* 5.6* 5.2*  ALBUMIN 2.9* 2.5* 2.3*   CBC:  Recent Labs Lab 01/11/2016 1944 01/15/16 1348  WBC 10.0 8.4  HGB 8.3* 8.0*  HCT 28.6* 27.4*  MCV 79.2 78.7  PLT 210 165   Cardiac Enzymes:  Recent Labs Lab 01/15/16 0822 01/15/16 1348 01/15/16 1542 01/15/16 1940 01/15/16 2213  CKTOTAL  --  388*  --   --   --   TROPONINI 0.10* 0.12* 0.11* 0.10* 0.08*   BNP (last 3 results)  Recent Labs  11/27/15 1140  BNP 540.4*    ProBNP (last 3 results)  Recent Labs  05/21/15 0835  PROBNP  140.0*    CBG:  Recent Labs Lab 01/17/16 1214 01/17/16 1744 01/17/16 2029 01/18/16 0823 01/18/16 1212  GLUCAP 209* 191* 197* 96 138*   Studies: No results found.  Scheduled Meds: . antiseptic oral rinse  7 mL Mouth Rinse BID  . apixaban  5 mg Oral BID  . aspirin EC  81 mg Oral Daily  . budesonide (PULMICORT) nebulizer solution  0.25 mg Nebulization BID  . doxycycline  100 mg Oral Q12H  . gabapentin  600 mg Oral TID  . hydrALAZINE  10 mg Oral 3 times per day  . insulin aspart  0-9 Units Subcutaneous TID WC  . ipratropium-albuterol  3 mL Nebulization Q6H  . LORazepam  0.5 mg Oral BID  . oseltamivir  30 mg Oral BID  . potassium chloride  10 mEq Oral Daily  . sertraline  25 mg Oral Daily  . white petrolatum       Continuous Infusions:   Principal Problem:   Generalized weakness Active Problems:   Essential hypertension   PVD, s/p AOBF '04, Lt SCA BPG 9/05   Diabetes mellitus type 2, controlled ([place])   PAF (paroxysmal atrial fibrillation) (HCC)   Weakness    Time spent: 35 minutes    Velvet Bathe  [facility] Pager [phone]. If 7PM-7AM, please contact night-coverage at [email or web address], password [facility] 01/18/2016, 2:12 PM  LOS: 3 days

## 2016-01-19 ENCOUNTER — Observation Stay (HOSPITAL_COMMUNITY): Payer: Medicare Other

## 2016-01-19 DIAGNOSIS — E1121 Type 2 diabetes mellitus with diabetic nephropathy: Secondary | ICD-10-CM | POA: Diagnosis not present

## 2016-01-19 DIAGNOSIS — I10 Essential (primary) hypertension: Secondary | ICD-10-CM | POA: Diagnosis not present

## 2016-01-19 DIAGNOSIS — I2510 Atherosclerotic heart disease of native coronary artery without angina pectoris: Secondary | ICD-10-CM | POA: Diagnosis present

## 2016-01-19 DIAGNOSIS — F329 Major depressive disorder, single episode, unspecified: Secondary | ICD-10-CM | POA: Diagnosis present

## 2016-01-19 DIAGNOSIS — I480 Paroxysmal atrial fibrillation: Secondary | ICD-10-CM | POA: Diagnosis present

## 2016-01-19 DIAGNOSIS — Z7984 Long term (current) use of oral hypoglycemic drugs: Secondary | ICD-10-CM | POA: Diagnosis not present

## 2016-01-19 DIAGNOSIS — Z7982 Long term (current) use of aspirin: Secondary | ICD-10-CM | POA: Diagnosis not present

## 2016-01-19 DIAGNOSIS — I5032 Chronic diastolic (congestive) heart failure: Secondary | ICD-10-CM | POA: Diagnosis present

## 2016-01-19 DIAGNOSIS — F419 Anxiety disorder, unspecified: Secondary | ICD-10-CM | POA: Diagnosis present

## 2016-01-19 DIAGNOSIS — E785 Hyperlipidemia, unspecified: Secondary | ICD-10-CM | POA: Diagnosis present

## 2016-01-19 DIAGNOSIS — R410 Disorientation, unspecified: Secondary | ICD-10-CM | POA: Diagnosis not present

## 2016-01-19 DIAGNOSIS — J9620 Acute and chronic respiratory failure, unspecified whether with hypoxia or hypercapnia: Secondary | ICD-10-CM | POA: Diagnosis present

## 2016-01-19 DIAGNOSIS — I739 Peripheral vascular disease, unspecified: Secondary | ICD-10-CM | POA: Diagnosis present

## 2016-01-19 DIAGNOSIS — Z9981 Dependence on supplemental oxygen: Secondary | ICD-10-CM | POA: Diagnosis not present

## 2016-01-19 DIAGNOSIS — M791 Myalgia: Secondary | ICD-10-CM | POA: Diagnosis present

## 2016-01-19 DIAGNOSIS — J9621 Acute and chronic respiratory failure with hypoxia: Secondary | ICD-10-CM | POA: Diagnosis present

## 2016-01-19 DIAGNOSIS — J449 Chronic obstructive pulmonary disease, unspecified: Secondary | ICD-10-CM | POA: Diagnosis present

## 2016-01-19 DIAGNOSIS — Z86718 Personal history of other venous thrombosis and embolism: Secondary | ICD-10-CM | POA: Diagnosis not present

## 2016-01-19 DIAGNOSIS — Z66 Do not resuscitate: Secondary | ICD-10-CM | POA: Diagnosis not present

## 2016-01-19 DIAGNOSIS — Z87891 Personal history of nicotine dependence: Secondary | ICD-10-CM | POA: Diagnosis not present

## 2016-01-19 DIAGNOSIS — Z8249 Family history of ischemic heart disease and other diseases of the circulatory system: Secondary | ICD-10-CM | POA: Diagnosis not present

## 2016-01-19 DIAGNOSIS — Z7952 Long term (current) use of systemic steroids: Secondary | ICD-10-CM | POA: Diagnosis not present

## 2016-01-19 DIAGNOSIS — Z951 Presence of aortocoronary bypass graft: Secondary | ICD-10-CM | POA: Diagnosis not present

## 2016-01-19 DIAGNOSIS — Z8673 Personal history of transient ischemic attack (TIA), and cerebral infarction without residual deficits: Secondary | ICD-10-CM | POA: Diagnosis not present

## 2016-01-19 DIAGNOSIS — E1165 Type 2 diabetes mellitus with hyperglycemia: Secondary | ICD-10-CM | POA: Diagnosis present

## 2016-01-19 DIAGNOSIS — J80 Acute respiratory distress syndrome: Secondary | ICD-10-CM | POA: Diagnosis not present

## 2016-01-19 DIAGNOSIS — R001 Bradycardia, unspecified: Secondary | ICD-10-CM | POA: Diagnosis present

## 2016-01-19 DIAGNOSIS — R531 Weakness: Secondary | ICD-10-CM | POA: Diagnosis not present

## 2016-01-19 DIAGNOSIS — R740 Nonspecific elevation of levels of transaminase and lactic acid dehydrogenase [LDH]: Secondary | ICD-10-CM | POA: Diagnosis present

## 2016-01-19 DIAGNOSIS — Z885 Allergy status to narcotic agent status: Secondary | ICD-10-CM | POA: Diagnosis not present

## 2016-01-19 DIAGNOSIS — T380X5A Adverse effect of glucocorticoids and synthetic analogues, initial encounter: Secondary | ICD-10-CM | POA: Diagnosis present

## 2016-01-19 DIAGNOSIS — I248 Other forms of acute ischemic heart disease: Secondary | ICD-10-CM | POA: Diagnosis present

## 2016-01-19 DIAGNOSIS — N179 Acute kidney failure, unspecified: Secondary | ICD-10-CM | POA: Diagnosis present

## 2016-01-19 DIAGNOSIS — I110 Hypertensive heart disease with heart failure: Secondary | ICD-10-CM | POA: Diagnosis present

## 2016-01-19 DIAGNOSIS — W19XXXA Unspecified fall, initial encounter: Secondary | ICD-10-CM | POA: Diagnosis present

## 2016-01-19 DIAGNOSIS — R7989 Other specified abnormal findings of blood chemistry: Secondary | ICD-10-CM | POA: Diagnosis present

## 2016-01-19 DIAGNOSIS — R0600 Dyspnea, unspecified: Secondary | ICD-10-CM | POA: Diagnosis not present

## 2016-01-19 DIAGNOSIS — J111 Influenza due to unidentified influenza virus with other respiratory manifestations: Secondary | ICD-10-CM | POA: Diagnosis not present

## 2016-01-19 DIAGNOSIS — T501X5A Adverse effect of loop [high-ceiling] diuretics, initial encounter: Secondary | ICD-10-CM | POA: Diagnosis not present

## 2016-01-19 DIAGNOSIS — K219 Gastro-esophageal reflux disease without esophagitis: Secondary | ICD-10-CM | POA: Diagnosis present

## 2016-01-19 DIAGNOSIS — E876 Hypokalemia: Secondary | ICD-10-CM | POA: Diagnosis present

## 2016-01-19 DIAGNOSIS — Z515 Encounter for palliative care: Secondary | ICD-10-CM | POA: Diagnosis not present

## 2016-01-19 DIAGNOSIS — Z833 Family history of diabetes mellitus: Secondary | ICD-10-CM | POA: Diagnosis not present

## 2016-01-19 DIAGNOSIS — J704 Drug-induced interstitial lung disorders, unspecified: Secondary | ICD-10-CM | POA: Diagnosis present

## 2016-01-19 DIAGNOSIS — J1000 Influenza due to other identified influenza virus with unspecified type of pneumonia: Secondary | ICD-10-CM | POA: Diagnosis present

## 2016-01-19 DIAGNOSIS — D649 Anemia, unspecified: Secondary | ICD-10-CM | POA: Diagnosis present

## 2016-01-19 DIAGNOSIS — Z79899 Other long term (current) drug therapy: Secondary | ICD-10-CM | POA: Diagnosis not present

## 2016-01-19 DIAGNOSIS — J101 Influenza due to other identified influenza virus with other respiratory manifestations: Secondary | ICD-10-CM | POA: Diagnosis present

## 2016-01-19 LAB — COMPREHENSIVE METABOLIC PANEL
ALT: 231 U/L — ABNORMAL HIGH (ref 14–54)
AST: 102 U/L — ABNORMAL HIGH (ref 15–41)
Albumin: 2.4 g/dL — ABNORMAL LOW (ref 3.5–5.0)
Alkaline Phosphatase: 78 U/L (ref 38–126)
Anion gap: 8 (ref 5–15)
BUN: 15 mg/dL (ref 6–20)
CHLORIDE: 106 mmol/L (ref 101–111)
CO2: 25 mmol/L (ref 22–32)
Calcium: 8.8 mg/dL — ABNORMAL LOW (ref 8.9–10.3)
Creatinine, Ser: 0.98 mg/dL (ref 0.44–1.00)
GFR, EST NON AFRICAN AMERICAN: 55 mL/min — AB (ref >60)
Glucose, Bld: 117 mg/dL — ABNORMAL HIGH (ref 65–99)
POTASSIUM: 4.1 mmol/L (ref 3.5–5.1)
SODIUM: 139 mmol/L (ref 135–145)
Total Bilirubin: 1.2 mg/dL (ref 0.3–1.2)
Total Protein: 5.8 g/dL — ABNORMAL LOW (ref 6.5–8.1)

## 2016-01-19 LAB — POCT I-STAT 3, ART BLOOD GAS (G3+)
Acid-Base Excess: 4 mmol/L — ABNORMAL HIGH (ref 0.0–2.0)
Bicarbonate: 27.5 mEq/L — ABNORMAL HIGH (ref 20.0–24.0)
O2 Saturation: 99 %
PCO2 ART: 37.7 mmHg (ref 35.0–45.0)
PH ART: 7.47 — AB (ref 7.350–7.450)
PO2 ART: 149 mmHg — AB (ref 80.0–100.0)
Patient temperature: 98.2
TCO2: 29 mmol/L (ref 0–100)

## 2016-01-19 LAB — GLUCOSE, CAPILLARY
GLUCOSE-CAPILLARY: 103 mg/dL — AB (ref 65–99)
GLUCOSE-CAPILLARY: 119 mg/dL — AB (ref 65–99)
GLUCOSE-CAPILLARY: 212 mg/dL — AB (ref 65–99)
Glucose-Capillary: 140 mg/dL — ABNORMAL HIGH (ref 65–99)

## 2016-01-19 MED ORDER — FUROSEMIDE 10 MG/ML IJ SOLN
40.0000 mg | Freq: Every day | INTRAMUSCULAR | Status: DC
  Administered 2016-01-20 – 2016-01-21 (×2): 40 mg via INTRAVENOUS
  Filled 2016-01-19 (×2): qty 4

## 2016-01-19 MED ORDER — FUROSEMIDE 10 MG/ML IJ SOLN
INTRAMUSCULAR | Status: AC
  Administered 2016-01-19: 40 mg
  Filled 2016-01-19: qty 4

## 2016-01-19 MED ORDER — METHYLPREDNISOLONE SODIUM SUCC 125 MG IJ SOLR
60.0000 mg | Freq: Every day | INTRAMUSCULAR | Status: DC
  Administered 2016-01-19 – 2016-01-21 (×3): 60 mg via INTRAVENOUS
  Filled 2016-01-19 (×3): qty 2

## 2016-01-19 NOTE — Procedures (Signed)
Pt not able to maintain acceptable sat , changed to VM and pt only came up to 84. She is now currently on  A NRB and has a sat of 97 with expiratory wheezing still persistent after nebulizer tx. RN notified , RT will continue to monitor.

## 2016-01-19 NOTE — Progress Notes (Signed)
[facility] PROGRESS NOTE  [name] [id number] DOB: 1941-05-19 DOA: 01/21/2016 PCP: Nyoka Cowden, MD  Assessment/Plan: 1-acute on chronic respiratory failure: secondary to COPD exacerbation, bronchitis and influenza -Continue doxycycline 100 mg twice a day and also Pulmicort nebulizer treatment -Pt with wheezes as such will restart corticosteroid -Continue as needed albuterol/Atrovent -Continue current treatment with Tamiflu -Started on flutter valve  - Administered Lasix given the patient's Lasix were initially held.  2-generalized weakness and myalgia: Secondary to influenza - Physical therapy to assess patient once medically stable  3-coronary artery disease status post CABG: Currently without chest pain - Mild elevation of her troponins appears to be secondary to demand ischemia - EKG without acute ischemic changes  - Will continue monitoring on telemetry   4-history of paroxysmal atrial fibrillation: Presented on bradycardia and sinus currently. Currently on B blocker, holding Cardizem and  amiodarone (last one secondary to transaminitis).  -Continue monitoring on telemetry and continue apixaban  5-chronic anemia: Hemoglobin currently stable and at her baseline  -Will follow trend  -Currently in no need for transfusion   6-chronic diastolic heart failure: Compensated  -Will resume Lasix on 01/18/2016, will replenish K levels first  7-depression/anxiety: Continue Zoloft   8-hyperlipidemia: Holding Lipitor secondary to transaminitis  9-transaminitis: Unclear etiology at this moment -Will hold amiodarone for today and discontinue statins for now -trending down currently with improvement in condition - recheck next am.  10-Diabetes with hyperglycemia -secondary to steroids  -continue SSI  11. Hypokalemia - Replace and reassess  Code Status: Full code Family Communication: No family at bedside Disposition Plan: To be determined. Patient  currently struggling with difficulty breathing and unable to speak in full sentences. physical therapy to be ordered once medically stable.    Consultants:  None  Procedures:  See below for x-ray reports  Antibiotics:  Doxycycline 01/16/16  Tamiflu 01/16/16  HPI/Subjective: Pt has no new complaints. Is looking forward to going home soon.  Objective: Filed Vitals:   01/19/16 1322 01/19/16 1454  BP: 114/55 130/40  Pulse: 76   Temp: 98.5 F (36.9 C)   Resp: 20     Intake/Output Summary (Last 24 hours) at 01/19/16 1711 Last data filed at 01/19/16 1500  Gross per 24 hour  Intake      0 ml  Output    350 ml  Net   -350 ml   Filed Weights   01/16/16 0406 01/17/16 0346 01/19/16 0511  Weight: 68.13 kg (150 lb 3.2 oz) 64.501 kg (142 lb 3.2 oz) 69 kg (152 lb 1.9 oz)    Exam:   General:  Pt in nad, alert and awake   Cardiovascular: S1 and S2, no rubs, no gallop; rate control  Respiratory: Diffuse wheezing, scattered rhonchi, no crackles; poor air movement.  Abdomen: Soft, nontender, nondistended, positive bowel sounds  Musculoskeletal: No edema, no cyanosis or clubbing  Data Reviewed: Basic Metabolic Panel:  Recent Labs Lab 01/19/2016 1944 01/15/16 1348 01/17/16 0558 01/18/16 0544 01/19/16 0639  NA 138 138 144 141 139  K 4.8 4.0 3.1* 3.9 4.1  CL 101 104 107 109 106  CO2 23 22 27 25 25   GLUCOSE 129* 145* 170* 125* 117*  BUN 32* 28* 15 17 15   CREATININE 1.64* 1.37* 1.07* 0.95 0.98  CALCIUM 8.7* 8.4* 8.5* 8.7* 8.8*   Liver Function Tests:  Recent Labs Lab 01/15/16 1348 01/17/16 0558 01/18/16 0544 01/19/16 0639  AST 1078* 331* 179* 102*  ALT 595* 413* 299* 231*  ALKPHOS 95  75 70 78  BILITOT 0.6 1.0 0.8 1.2  PROT 5.9* 5.6* 5.2* 5.8*  ALBUMIN 2.9* 2.5* 2.3* 2.4*   CBC:  Recent Labs Lab 01/24/2016 1944 01/15/16 1348  WBC 10.0 8.4  HGB 8.3* 8.0*  HCT 28.6* 27.4*  MCV 79.2 78.7  PLT 210 165   Cardiac Enzymes:  Recent Labs Lab 01/15/16 0822  01/15/16 1348 01/15/16 1542 01/15/16 1940 01/15/16 2213  CKTOTAL  --  388*  --   --   --   TROPONINI 0.10* 0.12* 0.11* 0.10* 0.08*   BNP (last 3 results)  Recent Labs  11/27/15 1140  BNP 540.4*    ProBNP (last 3 results)  Recent Labs  05/21/15 0835  PROBNP 140.0*    CBG:  Recent Labs Lab 01/18/16 1212 01/18/16 1756 01/18/16 2015 01/19/16 0828 01/19/16 1219  GLUCAP 138* 142* 133* 103* 119*   Studies: Dg Chest Port 1 View  01/19/2016  CLINICAL DATA:  Shortness of breath and congestion for 2 weeks. EXAM: PORTABLE CHEST 1 VIEW COMPARISON:  01/16/2016 FINDINGS: Prior CABG. Heart is borderline in size. There is diffuse interstitial and reticulonodular disease throughout the lungs bilaterally, similar to prior study. No effusions. No acute bony abnormality. IMPRESSION: Diffuse interstitial and reticulonodular disease is stable since prior study. Cannot exclude interstitial pneumonia. Electronically Signed   By: Rolm Baptise M.D.   On: 01/19/2016 13:03    Scheduled Meds: . antiseptic oral rinse  7 mL Mouth Rinse BID  . apixaban  5 mg Oral BID  . aspirin EC  81 mg Oral Daily  . budesonide (PULMICORT) nebulizer solution  0.25 mg Nebulization BID  . doxycycline  100 mg Oral Q12H  . furosemide  40 mg Intravenous Daily  . gabapentin  600 mg Oral TID  . hydrALAZINE  10 mg Oral 3 times per day  . insulin aspart  0-9 Units Subcutaneous TID WC  . ipratropium-albuterol  3 mL Nebulization Q6H  . LORazepam  0.5 mg Oral BID  . methylPREDNISolone (SOLU-MEDROL) injection  60 mg Intravenous Daily  . metoprolol tartrate  12.5 mg Oral BID  . oseltamivir  30 mg Oral BID  . potassium chloride  10 mEq Oral Daily  . sertraline  25 mg Oral Daily   Continuous Infusions:   Principal Problem:   Generalized weakness Active Problems:   Essential hypertension   PVD, s/p AOBF '04, Lt SCA BPG 9/05   Diabetes mellitus type 2, controlled ([place])   PAF (paroxysmal atrial fibrillation) ([place])    Weakness   Acute on chronic respiratory failure ([place])    Time spent: 35 minutes    Velvet Bathe  [facility] Pager [phone]. If 7PM-7AM, please contact night-coverage at [email or web address], password [facility] 01/19/2016, 5:11 PM  LOS: 4 days

## 2016-01-19 NOTE — Progress Notes (Signed)
Physical Therapy Treatment Patient Details Name: [name] MRN: [id number] DOB: 07/14/1941 Today's Date: 01/19/2016    History of Present Illness 75 y.o. female with history of paroxysmal atrial fibrillation, CAD status post CABG, COPD, hypertension and diabetes mellitus type 2. She was brought to the ER by patient's daughter due to weakness, decreased O2 sats, and fall at home. Pt admitted for acute on chronic respiratory failure due to COPD exacerbation, bronchitis, and influenza.     PT Comments    Pt performed supine therapeutic exercise in lieu of mobility secondary to fatigue and anxiety in regards to previous respiratory events.  Pt maintained SPO2 on 6L O2  90%-94% during exercise.  Pt delcined OOB mobility despite encouragement and education.    Follow Up Recommendations  No PT follow up;Supervision/Assistance - 24 hour     Equipment Recommendations  None recommended by PT    Recommendations for Other Services       Precautions / Restrictions Precautions Precautions: Fall Precaution Comments: watch O2 sats Restrictions Weight Bearing Restrictions: No    Mobility  Bed Mobility Overal bed mobility:  (did not perform pt anxious to move to seated position performed therapeutic exercise for PT intervention. )                Transfers Overall transfer level:  (not assessed.  )                  Ambulation/Gait Ambulation/Gait assistance:  (not assessed.)               Stairs            Wheelchair Mobility    Modified Rankin (Stroke Patients Only)       Balance                               High Level Balance Comments: balance not assessed during intervention    Cognition Arousal/Alertness: Awake/alert Behavior During Therapy: WFL for tasks assessed/performed Overall Cognitive Status: Within Functional Limits for tasks assessed                      Exercises General Exercises - Lower Extremity Ankle  Circles/Pumps: AROM;Both;Supine;10 reps Quad Sets: AROM;Both;10 reps;Supine Gluteal Sets: AROM;Both;10 reps;Supine Heel Slides: AROM;Both;10 reps;Supine Hip ABduction/ADduction: AROM;Both;10 reps;Supine Straight Leg Raises: AROM;Both;10 reps;Supine Other Exercises Other Exercises: Pt required tactile cueing for technique during supine therapeutic exercise.      General Comments        Pertinent Vitals/Pain Pain Assessment: No/denies pain    Home Living                      Prior Function            PT Goals (current goals can now be found in the care plan section) Acute Rehab PT Goals Potential to Achieve Goals: Good Progress towards PT goals: Progressing toward goals    Frequency  Min 3X/week    PT Plan      Co-evaluation             End of Session Equipment Utilized During Treatment: Oxygen Activity Tolerance: Patient limited by fatigue (anxiety) Patient left: in bed;with call bell/phone within reach;with bed alarm set     Time: 1401-1413 PT Time Calculation (min) (ACUTE ONLY): 12 min  Charges:  $Therapeutic Exercise: 8-22 mins  G Codes:      Cristela Blue 01/19/2016, 2:31 PM Governor Rooks, PTA pager [phone]

## 2016-01-19 NOTE — Progress Notes (Signed)
RN notified by RT of pt desaturation into the 80s. RN assessed pt and notified MD. Rapid response notified. New orders placed for ABG and implemented. Will continue to monitor and update physician as needed.

## 2016-01-19 NOTE — Progress Notes (Signed)
Occupational Therapy Treatment Patient Details Name: AALAYSIA BARTHELEMY MRN: [id number] DOB: 03/08/1941 Today's Date: 01/19/2016    History of present illness 75 y.o. female with history of paroxysmal atrial fibrillation, CAD status post CABG, COPD, hypertension and diabetes mellitus type 2. She was brought to the ER by patient's daughter due to weakness, decreased O2 sats, and fall at home. Pt admitted for acute on chronic respiratory failure due to COPD exacerbation, bronchitis, and influenza.    OT comments  Note events from earlier this a.m. With pts. o2 sats.  Focus of this session was providing energy conservation hand out and education only.  Daughter present for session.  Pt. And dtr. State pt. Has already incorporated most of these techniques into her daily routine.  Will continue to follow acutely and advance towards other ADL goals at pt. Able to tolerate.    Follow Up Recommendations  SNF;Other (comment)    Equipment Recommendations  None recommended by OT    Recommendations for Other Services      Precautions / Restrictions Precautions Precautions: Fall Precaution Comments: watch O2 sats Restrictions Weight Bearing Restrictions: No       Mobility Bed Mobility                  Transfers                      Balance                                   ADL                                         General ADL Comments: provided energy conservation handout for pt. and dtr. who was present.  note recent events from this am with pts. 02 fluctuations.  no mobility or ADLS this session as pt. is just now resuming feeling better and is comfortable seated eob.  discussed handout and reviewed home modifications for energy conservation at home.  pt. and dtr. report that pt. has already implemented many of these techniques into her daily routine.        Vision                     Perception     Praxis      Cognition                              Extremity/Trunk Assessment               Exercises     Shoulder Instructions       General Comments      Pertinent Vitals/ Pain          Home Living                                          Prior Functioning/Environment              Frequency Min 3X/week     Progress Toward Goals  OT Goals(current goals can now be found in the care plan section)  Progress towards OT goals: Progressing toward goals  Plan Discharge plan remains appropriate    Co-evaluation                 End of Session Equipment Utilized During Treatment: Oxygen   Activity Tolerance Other (comment) (session limited due to o2 issues)   Patient Left in bed;with call bell/phone within reach;with family/visitor present   Nurse Communication          Time: [id number] OT Time Calculation (min): 11 min  Charges: OT General Charges $OT Visit: 1 Procedure OT Treatments $Self Care/Home Management : 8-22 mins  Janice Coffin, COTA/L 01/19/2016, 8:42 AM

## 2016-01-20 LAB — GLUCOSE, CAPILLARY
GLUCOSE-CAPILLARY: 130 mg/dL — AB (ref 65–99)
GLUCOSE-CAPILLARY: 182 mg/dL — AB (ref 65–99)
Glucose-Capillary: 137 mg/dL — ABNORMAL HIGH (ref 65–99)
Glucose-Capillary: 198 mg/dL — ABNORMAL HIGH (ref 65–99)

## 2016-01-20 MED ORDER — IPRATROPIUM-ALBUTEROL 0.5-2.5 (3) MG/3ML IN SOLN
3.0000 mL | Freq: Three times a day (TID) | RESPIRATORY_TRACT | Status: DC
  Administered 2016-01-21 (×2): 3 mL via RESPIRATORY_TRACT
  Filled 2016-01-20 (×2): qty 3

## 2016-01-20 NOTE — Progress Notes (Signed)
[facility] PROGRESS NOTE  [name] [id number] DOB: 1941-03-15 DOA: 01/02/2016 PCP: Nyoka Cowden, MD  Brief Narrative  patient is a 75 year old Caucasian female with history of CAD status post CABG, paroxysmal atrial fibrillation, chronic anemia, chronic diastolic heart failure, history of COPD who presented with acute on chronic respiratory failure. Patient diagnosed with influenza B and currently on Tamiflu as well as doxycycline. Patient was taken off of her prednisone lately due to lack of wheezes but next day had worsening in breathing condition improved after continuing steroids and diuresing.  Assessment/Plan: 1-acute on chronic respiratory failure: secondary to COPD exacerbation, bronchitis and influenza -Continue doxycycline 100 mg twice a day and also Pulmicort nebulizer treatment -continue current steroid regimen, pt will most likely need a prednisone taper on d/c -Continue as needed albuterol/Atrovent -Continue current treatment with Tamiflu -Started on flutter valve  - Administered Lasix given the patient's Lasix were initially held.  2-generalized weakness and myalgia: Secondary to influenza - Physical therapy to assess patient once medically stable  3-coronary artery disease status post CABG: Currently without chest pain - Mild elevation of her troponins appears to be secondary to demand ischemia - EKG without acute ischemic changes  - Will continue monitoring on telemetry   4-history of paroxysmal atrial fibrillation: Presented on bradycardia and sinus currently. Currently on B blocker, holding Cardizem and  amiodarone (last one secondary to transaminitis).  -Continue monitoring on telemetry and continue apixaban  5-chronic anemia: Hemoglobin currently stable and at her baseline  -Currently in no need for transfusion   6-chronic diastolic heart failure: Compensated  -Will resume Lasix   7-depression/anxiety: Continue Zoloft    8-hyperlipidemia: Holding Lipitor secondary to transaminitis  9-transaminitis: Unclear etiology at this moment -Will hold amiodarone for today and discontinue statins for now -trending down currently with improvement in condition - recheck next am.  10-Diabetes with hyperglycemia -secondary to steroids  -continue SSI  11. Hypokalemia - Replace and reassess  Code Status: Full code Family Communication: No family at bedside Disposition Plan: With continued improvement in respiratory condition within the next one or 2 days.   Consultants:  None  Procedures:  See below for x-ray reports  Antibiotics:  Doxycycline 01/16/16  Tamiflu 01/16/16  HPI/Subjective: Pt states that her breathing is improved from yesterday. Yesterday patient required to go on nonrebreather.  Objective: Filed Vitals:   01/20/16 0502 01/20/16 0855  BP: 152/54 142/40  Pulse: 65 71  Temp: 97.7 F (36.5 C)   Resp: 16     Intake/Output Summary (Last 24 hours) at 01/20/16 1316 Last data filed at 01/20/16 0830  Gross per 24 hour  Intake    390 ml  Output    550 ml  Net   -160 ml   Filed Weights   01/16/16 0406 01/17/16 0346 01/19/16 0511  Weight: 68.13 kg (150 lb 3.2 oz) 64.501 kg (142 lb 3.2 oz) 69 kg (152 lb 1.9 oz)    Exam:   General:  Pt in nad, alert and awake   Cardiovascular: S1 and S2, no rubs, no gallop; rate control  Respiratory: Equal chest rise, nasal cannula in place, speaking in full sentences  Abdomen: Soft, nontender, nondistended, positive bowel sounds  Musculoskeletal: No edema, no cyanosis or clubbing  Data Reviewed: Basic Metabolic Panel:  Recent Labs Lab 12/31/2015 1944 01/15/16 1348 01/17/16 0558 01/18/16 0544 01/19/16 0639  NA 138 138 144 141 139  K 4.8 4.0 3.1* 3.9 4.1  CL 101 104 107 109 106  CO2  23 22 27 25 25   GLUCOSE 129* 145* 170* 125* 117*  BUN 32* 28* 15 17 15   CREATININE 1.64* 1.37* 1.07* 0.95 0.98  CALCIUM 8.7* 8.4* 8.5* 8.7* 8.8*    Liver Function Tests:  Recent Labs Lab 01/15/16 1348 01/17/16 0558 01/18/16 0544 01/19/16 0639  AST 1078* 331* 179* 102*  ALT 595* 413* 299* 231*  ALKPHOS 95 75 70 78  BILITOT 0.6 1.0 0.8 1.2  PROT 5.9* 5.6* 5.2* 5.8*  ALBUMIN 2.9* 2.5* 2.3* 2.4*   CBC:  Recent Labs Lab 01/27/2016 1944 01/15/16 1348  WBC 10.0 8.4  HGB 8.3* 8.0*  HCT 28.6* 27.4*  MCV 79.2 78.7  PLT 210 165   Cardiac Enzymes:  Recent Labs Lab 01/15/16 0822 01/15/16 1348 01/15/16 1542 01/15/16 1940 01/15/16 2213  CKTOTAL  --  388*  --   --   --   TROPONINI 0.10* 0.12* 0.11* 0.10* 0.08*   BNP (last 3 results)  Recent Labs  11/27/15 1140  BNP 540.4*    ProBNP (last 3 results)  Recent Labs  05/21/15 0835  PROBNP 140.0*    CBG:  Recent Labs Lab 01/19/16 1219 01/19/16 1716 01/19/16 2053 01/20/16 0751 01/20/16 1238  GLUCAP 119* 140* 212* 137* 198*   Studies: Dg Chest Port 1 View  01/19/2016  CLINICAL DATA:  Shortness of breath and congestion for 2 weeks. EXAM: PORTABLE CHEST 1 VIEW COMPARISON:  01/16/2016 FINDINGS: Prior CABG. Heart is borderline in size. There is diffuse interstitial and reticulonodular disease throughout the lungs bilaterally, similar to prior study. No effusions. No acute bony abnormality. IMPRESSION: Diffuse interstitial and reticulonodular disease is stable since prior study. Cannot exclude interstitial pneumonia. Electronically Signed   By: Rolm Baptise M.D.   On: 01/19/2016 13:03    Scheduled Meds: . antiseptic oral rinse  7 mL Mouth Rinse BID  . apixaban  5 mg Oral BID  . aspirin EC  81 mg Oral Daily  . budesonide (PULMICORT) nebulizer solution  0.25 mg Nebulization BID  . doxycycline  100 mg Oral Q12H  . furosemide  40 mg Intravenous Daily  . gabapentin  600 mg Oral TID  . hydrALAZINE  10 mg Oral 3 times per day  . insulin aspart  0-9 Units Subcutaneous TID WC  . ipratropium-albuterol  3 mL Nebulization Q6H  . LORazepam  0.5 mg Oral BID  .  methylPREDNISolone (SOLU-MEDROL) injection  60 mg Intravenous Daily  . metoprolol tartrate  12.5 mg Oral BID  . potassium chloride  10 mEq Oral Daily  . sertraline  25 mg Oral Daily   Continuous Infusions:   Principal Problem:   Generalized weakness Active Problems:   Essential hypertension   PVD, s/p AOBF '04, Lt SCA BPG 9/05   Diabetes mellitus type 2, controlled ([place])   PAF (paroxysmal atrial fibrillation) ([place])   Weakness   Acute on chronic respiratory failure ([place])    Time spent: 35 minutes    Velvet Bathe  [facility] Pager [phone]. If 7PM-7AM, please contact night-coverage at [email or web address], password [facility] 01/20/2016, 1:16 PM  LOS: 5 days

## 2016-01-21 ENCOUNTER — Inpatient Hospital Stay (HOSPITAL_COMMUNITY): Payer: Medicare Other

## 2016-01-21 DIAGNOSIS — I739 Peripheral vascular disease, unspecified: Secondary | ICD-10-CM

## 2016-01-21 DIAGNOSIS — J111 Influenza due to unidentified influenza virus with other respiratory manifestations: Secondary | ICD-10-CM | POA: Insufficient documentation

## 2016-01-21 DIAGNOSIS — R0602 Shortness of breath: Secondary | ICD-10-CM

## 2016-01-21 DIAGNOSIS — R7989 Other specified abnormal findings of blood chemistry: Secondary | ICD-10-CM

## 2016-01-21 DIAGNOSIS — R778 Other specified abnormalities of plasma proteins: Secondary | ICD-10-CM | POA: Insufficient documentation

## 2016-01-21 LAB — GLUCOSE, CAPILLARY
GLUCOSE-CAPILLARY: 105 mg/dL — AB (ref 65–99)
GLUCOSE-CAPILLARY: 127 mg/dL — AB (ref 65–99)
GLUCOSE-CAPILLARY: 145 mg/dL — AB (ref 65–99)
Glucose-Capillary: 238 mg/dL — ABNORMAL HIGH (ref 65–99)

## 2016-01-21 MED ORDER — IPRATROPIUM-ALBUTEROL 0.5-2.5 (3) MG/3ML IN SOLN
3.0000 mL | Freq: Four times a day (QID) | RESPIRATORY_TRACT | Status: DC
  Administered 2016-01-21: 3 mL via RESPIRATORY_TRACT
  Filled 2016-01-21: qty 3

## 2016-01-21 MED ORDER — BUDESONIDE 0.5 MG/2ML IN SUSP
0.5000 mg | Freq: Two times a day (BID) | RESPIRATORY_TRACT | Status: DC
  Administered 2016-01-21 – 2016-01-27 (×11): 0.5 mg via RESPIRATORY_TRACT
  Filled 2016-01-21 (×12): qty 2

## 2016-01-21 MED ORDER — IPRATROPIUM-ALBUTEROL 0.5-2.5 (3) MG/3ML IN SOLN
3.0000 mL | Freq: Three times a day (TID) | RESPIRATORY_TRACT | Status: DC
  Administered 2016-01-22 – 2016-01-26 (×13): 3 mL via RESPIRATORY_TRACT
  Filled 2016-01-21 (×13): qty 3

## 2016-01-21 MED ORDER — METHYLPREDNISOLONE SODIUM SUCC 125 MG IJ SOLR
60.0000 mg | Freq: Two times a day (BID) | INTRAMUSCULAR | Status: DC
  Administered 2016-01-21 – 2016-01-25 (×8): 60 mg via INTRAVENOUS
  Filled 2016-01-21 (×8): qty 2

## 2016-01-21 MED ORDER — FUROSEMIDE 10 MG/ML IJ SOLN
40.0000 mg | Freq: Four times a day (QID) | INTRAMUSCULAR | Status: DC
  Administered 2016-01-21 – 2016-01-22 (×4): 40 mg via INTRAVENOUS
  Filled 2016-01-21 (×4): qty 4

## 2016-01-21 NOTE — Progress Notes (Signed)
Physical Therapy Treatment Patient Details Name: [name] MRN: [id number] DOB: 1941-10-06 Today's Date: 01/21/2016    History of Present Illness 75 y.o. female with history of paroxysmal atrial fibrillation, CAD status post CABG, COPD, hypertension and diabetes mellitus type 2. She was brought to the ER by patient's daughter due to weakness, decreased O2 sats, and fall at home. Pt admitted for acute on chronic respiratory failure due to COPD exacerbation, bronchitis, and influenza.     PT Comments    Pt tolerated increased activity on 6L of O2.  Pt SPO2 87%-92% during ambulation.  Post ambulation SP02 decreased to 79% then maintained 82%-85% RN informed of change.  Pt shows no signs of respiratory distress.    Follow Up Recommendations  No PT follow up;Supervision/Assistance - 24 hour     Equipment Recommendations  None recommended by PT    Recommendations for Other Services       Precautions / Restrictions Precautions Precautions: Fall Precaution Comments: watch O2 sats (Pt asymptomatic to decrease in O2 sats.  ) Restrictions Weight Bearing Restrictions: No    Mobility  Bed Mobility Overal bed mobility:  (Pt recieved in recliner chair.  )                Transfers Overall transfer level: Needs assistance Equipment used: Rolling walker (2 wheeled) Transfers: Sit to/from Stand Sit to Stand: Min assist;Min guard Stand pivot transfers: Min guard;Min assist       General transfer comment: Pt required cues for balance and transition.  Pt performed sit to stand from chair and from 3:1 commode.    Ambulation/Gait Ambulation/Gait assistance: Min guard Ambulation Distance (Feet): 160 Feet Assistive device: Rolling walker (2 wheeled)   Gait velocity: decreased Gait velocity interpretation: Below normal speed for age/gender General Gait Details: Pt performed step through gait with decreased stride length.  VCs for RW safety and advancement of RW during turns and  backing.  Pt required cues pacing with 1 standing rest break at 80 ft.     Stairs            Wheelchair Mobility    Modified Rankin (Stroke Patients Only)       Balance Overall balance assessment: Needs assistance   Sitting balance-Leahy Scale: Good       Standing balance-Leahy Scale: Fair                      Cognition Arousal/Alertness: Awake/alert Behavior During Therapy: WFL for tasks assessed/performed Overall Cognitive Status: Within Functional Limits for tasks assessed                      Exercises      General Comments        Pertinent Vitals/Pain Pain Assessment: No/denies pain    Home Living                      Prior Function            PT Goals (current goals can now be found in the care plan section) Acute Rehab PT Goals Potential to Achieve Goals: Good Progress towards PT goals: Progressing toward goals    Frequency  Min 3X/week    PT Plan      Co-evaluation             End of Session Equipment Utilized During Treatment: Oxygen;Gait belt Activity Tolerance: Patient limited by fatigue (decreased O2 sats. ) Patient left:  in chair;with chair alarm set;with call bell/phone within reach     Time: 1216-1250 PT Time Calculation (min) (ACUTE ONLY): 34 min  Charges:  $Gait Training: 8-22 mins $Therapeutic Activity: 8-22 mins                    G Codes:      Cristela Blue 02/08/16, 1:28 PM  Governor Rooks, PTA pager [phone]

## 2016-01-21 NOTE — Progress Notes (Signed)
PROGRESS NOTE  [name] [id number] DOB: 1941/05/24 DOA: 01/19/2016 PCP: Nyoka Cowden, MD Outpatient Specialists:    LOS: 6 days   Brief Narrative: 75 year old female with history of paroxysmal A. fib, coronary artery disease, COPD, hypertension, diabetes, admitted on 2/16 with hypoxic respiratory failure found to have influenza, started on Tamiflu as well as antibiotics for community-acquired pneumonia.  Assessment & Plan: Principal Problem:   Generalized weakness Active Problems:   Essential hypertension   PVD, s/p AOBF '04, Lt [facility] BPG 9/05   Diabetes mellitus type 2, controlled (HCC)   PAF (paroxysmal atrial fibrillation) (HCC)   Weakness   Acute on chronic respiratory failure (HCC)   Acute on chronic hypoxic respiratory failure - Likely in the setting of COPD/sedation, bronchitis as well as influenza - She completed treatment with Tamiflu, currently is on doxycycline - Patient without improvement over the last 6 days, I consulted pulmonology today, appreciate input. We'll obtain CT scan of the chest, IV Lasix for now  Coronary artery disease status post CABG - Currently without chest pain - Mild elevation of her troponins appears to be secondary to demand ischemia - EKG without acute ischemic changes   History of paroxysmal atrial fibrillation  - Presented on bradycardia and sinus currently. Currently on B blocker, holding Cardizem andamiodarone (last one secondary to transaminitis).  - Continue monitoring on telemetry and continue apixaban  Chronic anemia - Hemoglobin currently stable and at her baseline   Chronic diastolic heart failure - No significant evidence of fluid overload on physical exam or chest x-ray  Depression/anxiety - Continue Zoloft   Hyperlipidemia - Holding Lipitor secondary to transaminitis  Transaminitis - Unclear etiology at this moment, may be related to her statin - Amiodarone has been held, however it's unlikely  to be off of her system, her LFTs appear to be improving yesterday, this is less likely due to amiodarone, will repeat LFTs tomorrow morning  Diabetes with hyperglycemia -secondary to steroids  -continue SSI  Hypokalemia - Replace and reassess   DVT prophylaxis: Eliquis Code Status: Full Family Communication: discussed with daughter over the phone Disposition Plan: home when ready  Barriers for discharge: hypoxia  Consultants:   Pulmonary   Procedures:   2D echo: pending  Antimicrobials:  Tamiflu 2/16 >> 2/21  Doxycycline 2/17 >> with plans for 8 days, today day 5/8  Subjective: - Patient feeling subjectively short of breath while talking, however her nasal cannula prongs were only in one nare, and she appears to be a mouth breather. She has no chest pain, no abdominal pain, nausea, vomiting or diarrhea.  Objective: Filed Vitals:   01/21/16 1245 01/21/16 1300 01/21/16 1313 01/21/16 1600  BP: 132/53     Pulse: 74     Temp: 98.7 F (37.1 C)     TempSrc: Oral     Resp: 16     Height:      Weight:      SpO2: 90% 88% 79% 89%    Intake/Output Summary (Last 24 hours) at 01/21/16 1705 Last data filed at 01/21/16 1622  Gross per 24 hour  Intake    240 ml  Output   1000 ml  Net   -760 ml   Filed Weights   01/16/16 0406 01/17/16 0346 01/19/16 0511  Weight: 68.13 kg (150 lb 3.2 oz) 64.501 kg (142 lb 3.2 oz) 69 kg (152 lb 1.9 oz)    Examination: BP 132/53 mmHg  Pulse 74  Temp(Src) 98.7 F (37.1 C) (Oral)  Resp 16  Ht 5\' 2"  (1.575 m)  Wt 69 kg (152 lb 1.9 oz)  BMI 27.82 kg/m2  SpO2 89% General exam: NAD Respiratory system: Clear. Mild increased work of breathing. No wheezing, no crackles Cardiovascular system: regular rate and rhythm, 3/6 SEM. No JVD. No peripheral edema.  Gastrointestinal system: Abdomen is nondistended, soft and nontender. Normal bowel sounds heard. Central nervous system: AxOx3. No focal deficits Extremities: No clubbing/cyanosis Skin:  no rashes   Data Reviewed: I have personally reviewed following labs and imaging studies  CBC:  Recent Labs Lab 01/13/2016 1944 01/15/16 1348  WBC 10.0 8.4  HGB 8.3* 8.0*  HCT 28.6* 27.4*  MCV 79.2 78.7  PLT 210 [id number]   Basic Metabolic Panel:  Recent Labs Lab 01/26/2016 1944 01/15/16 1348 01/17/16 0558 01/18/16 0544 01/19/16 0639  NA 138 138 144 141 139  K 4.8 4.0 3.1* 3.9 4.1  CL 101 104 107 109 106  CO2 23 22 27 25 25   GLUCOSE 129* 145* 170* 125* 117*  BUN 32* 28* 15 17 15   CREATININE 1.64* 1.37* 1.07* 0.95 0.98  CALCIUM 8.7* 8.4* 8.5* 8.7* 8.8*   GFR: Estimated Creatinine Clearance: 45.9 mL/min (by C-G formula based on Cr of 0.98). Liver Function Tests:  Recent Labs Lab 01/15/16 1348 01/17/16 0558 01/18/16 0544 01/19/16 0639  AST 1078* 331* 179* 102*  ALT 595* 413* 299* 231*  ALKPHOS 95 75 70 78  BILITOT 0.6 1.0 0.8 1.2  PROT 5.9* 5.6* 5.2* 5.8*  ALBUMIN 2.9* 2.5* 2.3* 2.4*   No results for input(s): LIPASE, AMYLASE in the last 168 hours. No results for input(s): AMMONIA in the last 168 hours. Coagulation Profile: No results for input(s): INR, PROTIME in the last 168 hours. Cardiac Enzymes:  Recent Labs Lab 01/15/16 0822 01/15/16 1348 01/15/16 1542 01/15/16 1940 01/15/16 2213  CKTOTAL  --  388*  --   --   --   TROPONINI 0.10* 0.12* 0.11* 0.10* 0.08*   BNP (last 3 results)  Recent Labs  05/21/15 0835  PROBNP 140.0*   HbA1C: No results for input(s): HGBA1C in the last 72 hours. CBG:  Recent Labs Lab 01/20/16 1238 01/20/16 1656 01/20/16 2018 01/21/16 0747 01/21/16 1215  GLUCAP 198* 130* 182* 105* 127*   Lipid Profile: No results for input(s): CHOL, HDL, LDLCALC, TRIG, CHOLHDL, LDLDIRECT in the last 72 hours. Thyroid Function Tests: No results for input(s): TSH, T4TOTAL, FREET4, T3FREE, THYROIDAB in the last 72 hours. Anemia Panel: No results for input(s): VITAMINB12, FOLATE, FERRITIN, TIBC, IRON, RETICCTPCT in the last 72  hours. Urine analysis:    Component Value Date/Time   COLORURINE YELLOW 01/15/2016 1517   APPEARANCEUR CLOUDY* 01/15/2016 1517   LABSPEC 1.015 01/15/2016 1517   PHURINE 6.0 01/15/2016 1517   GLUCOSEU NEGATIVE 01/15/2016 1517   GLUCOSEU NEGATIVE 02/18/2015 0913   HGBUR NEGATIVE 01/15/2016 1517   HGBUR negative 09/01/2009 [place] 01/15/2016 1517   BILIRUBINUR n 07/18/2013 [place] 01/15/2016 1517   PROTEINUR 30* 01/15/2016 1517   PROTEINUR n 07/18/2013 1604   UROBILINOGEN 0.2 02/18/2015 0913   UROBILINOGEN 1.0 07/18/2013 1604   NITRITE POSITIVE* 01/15/2016 1517   NITRITE positive 07/18/2013 1604   LEUKOCYTESUR NEGATIVE 01/15/2016 1517   Sepsis Labs: Invalid input(s): PROCALCITONIN, LACTICIDVEN  No results found for this or any previous visit (from the past 240 hour(s)).    Radiology Studies: No results found.   Scheduled Meds: . antiseptic oral rinse  7 mL Mouth  Rinse BID  . apixaban  5 mg Oral BID  . aspirin EC  81 mg Oral Daily  . budesonide (PULMICORT) nebulizer solution  0.5 mg Nebulization BID  . doxycycline  100 mg Oral Q12H  . furosemide  40 mg Intravenous Q6H  . gabapentin  600 mg Oral TID  . hydrALAZINE  10 mg Oral 3 times per day  . insulin aspart  0-9 Units Subcutaneous TID WC  . ipratropium-albuterol  3 mL Nebulization Q6H  . LORazepam  0.5 mg Oral BID  . methylPREDNISolone (SOLU-MEDROL) injection  60 mg Intravenous Q12H  . metoprolol tartrate  12.5 mg Oral BID  . potassium chloride  10 mEq Oral Daily  . sertraline  25 mg Oral Daily   Continuous Infusions:     Marzetta Board, MD, PhD [facility] Pager [phone]  If 7PM-7AM, please contact night-coverage [email or web address] Password [facility] 01/21/2016, 5:05 PM

## 2016-01-21 NOTE — Consult Note (Signed)
Name: [name] MRN: [id number] DOB: 07/10/1941    ADMISSION DATE:  01/03/2016 CONSULTATION DATE:  01/21/16  REFERRING MD :  Wardell Heath  CHIEF COMPLAINT:  SOB  BRIEF PATIENT DESCRIPTION: [name] is a 75 y.o. female with history of paroxysmal atrial fibrillation, CAD status post CABG, COPD, hypertension and diabetes mellitus type 2 and fall on 2/15 and was brought to the ER  On 2/16  For the patient's daughter found the patient was weak and had some slurred speech. Patient uses 2 liters of oxygen at home. Patient is currently diagnosed with influenza and currently getting treated  With Tamiflu and Doxycycline. On 2/22/ while working with the PT her O2 dropped down to 79% from 87-89% then maintained in between 82-85%.  Therefore PCCM team was consulted on 01/21/16  SIGNIFICANT EVENTS  ED  2/16 with increased weakness and slurred speech  STUDIES:  CT head >2/16 was neg Influenza A >2/16 positive    HISTORY OF PRESENT ILLNESS:   [name] is a 75 y.o. female with history of paroxysmal atrial fibrillation, CAD status post CABG, COPD, hypertension and diabetes mellitus type 2 and fall on 2/15 and was brought to the ER  On 2/16  For the patient's daughter found the patient was weak and had some slurred speech. Patient uses 2 liters of oxygen at home. Patient is currently diagnosed with influenza and currently getting treated  With Tamiflu and Doxycycline. On 2/22/ while working with the PT her O2 dropped down to 79% from 87-89% then maintained in between 82-85%.  Patient denies smoking(says quit 21 years ago), cough is productive in nature, moderate amount,yellow in color.  PAST MEDICAL HISTORY :   has a past medical history of ANEMIA (08/28/2010); CAD (coronary artery disease); Adenomatous colon polyp (2000); COPD (chronic obstructive pulmonary disease) ([place]); Depressive disorder; GERD (12/19/2009); Hyperlipidemia; Hypertension; HYPOTENSION (08/11/2010); PVD (peripheral vascular  disease) ([place]); Lung abscess ([place]) (2011); CHF (congestive heart failure) ([place]); Diverticulosis; Aortic stenosis; Ringing in ears; Anxiety; Joint pain; On home oxygen therapy; Atrial fibrillation ([place]); DVT (deep venous thrombosis) ([place]) (X 1); Type II diabetes mellitus ([employer]); History of blood transfusion (2011; 08/2014); Stroke ([place]) (~ 01/2014); Hiatal hernia; Osteoarthritis; and Arthritis.  has past surgical history that includes Subclavian Bypass Graft (Left, 07/2004); Tubal ligation; Coronary artery bypass graft (05/2000); Femoral-popliteal Bypass Graft (Right, 04/1999); Angioplasty / stenting iliac (Right, 07/2000); right leg blockage (Right, 2003); Aorta - bilateral femoral artery bypass graft (2004); Carotid endarterectomy (Left, 2005); Lung biopsy (2011); Esophagogastroduodenoscopy (N/A, 08/03/2014); Colonoscopy (Left, 08/05/2014); Givens capsule study (N/A, 08/06/2014); left heart catheterization with coronary/graft angiogram (03/11/2014); and Cataract extraction, bilateral (Bilateral, ~ 2008). Prior to Admission medications   Medication Sig Start Date End Date Taking? Authorizing Provider  albuterol (PROVENTIL HFA;VENTOLIN HFA) 108 (90 Base) MCG/ACT inhaler Inhale 2 puffs into the lungs every 4 (four) hours as needed for wheezing or shortness of breath. 11/28/15  Yes Nishant Dhungel, MD  amiodarone (PACERONE) 200 MG tablet Take 0.5 tablets (100 mg total) by mouth daily. 11/17/15  Yes Dorothy Spark, MD  aspirin EC 81 MG EC tablet Take 1 tablet (81 mg total) by mouth daily. 08/16/14  Yes Hosie Poisson, MD  atorvastatin (LIPITOR) 40 MG tablet TAKE 1 TABLET (40 MG TOTAL) BY MOUTH EVERY EVENING. 01/09/16  Yes Dorothy Spark, MD  Diclofenac Sodium 1 % CREA Place 1 application onto the skin 2 (two) times daily. Patient taking differently: Place 1 application onto the skin 2 (two)  times daily as needed (pain).  09/29/15  Yes Marletta Lor, MD  diltiazem (CARDIZEM CD) 120 MG 24 hr capsule TAKE ONE CAPSULE  BY MOUTH DAILY 12/24/15  Yes Dorothy Spark, MD  ELIQUIS 5 MG TABS tablet TAKE 1 TABLET (5 MG TOTAL) BY MOUTH 2 (TWO) TIMES DAILY. 11/26/15  Yes Dorothy Spark, MD  furosemide (LASIX) 20 MG tablet Take 1 tablet (20 mg total) by mouth daily. 11/04/14  Yes Dorothy Spark, MD  gabapentin (NEURONTIN) 600 MG tablet TAKE 1 TABLET (600 MG TOTAL) BY MOUTH 3 (THREE) TIMES DAILY. 10/27/15  Yes Marletta Lor, MD  guaiFENesin-dextromethorphan [facility] DM) 100-10 MG/5ML syrup Take 5 mLs by mouth every 4 (four) hours as needed for cough. 11/28/15  Yes Nishant Dhungel, MD  LORazepam (ATIVAN) 0.5 MG tablet TAKE ONE TABLET BY MOUTH TWICE DAILY 12/30/15  Yes Marletta Lor, MD  metFORMIN (GLUCOPHAGE) 500 MG tablet TAKE 1 TABLET (500 MG TOTAL) BY MOUTH 2 (TWO) TIMES DAILY WITH A MEAL. 11/11/15  Yes Marletta Lor, MD  metoprolol tartrate (LOPRESSOR) 25 MG tablet Take 1 tablet (25 mg total) by mouth 2 (two) times daily. 02/18/15  Yes Dorothy Spark, MD  Multiple Vitamins-Minerals (CENTRUM SILVER ULTRA WOMENS PO) Take 1 capsule by mouth daily.    Yes Historical Provider, MD  nitroGLYCERIN (NITROSTAT) 0.4 MG SL tablet Place 0.4 mg under the tongue every 5 (five) minutes as needed for chest pain.   Yes Historical Provider, MD  potassium chloride (K-DUR,KLOR-CON) 10 MEQ tablet TAKE ONE TABLET BY MOUTH DAILY 12/17/15  Yes Marletta Lor, MD  predniSONE (DELTASONE) 20 MG tablet Take 1 tablet (20 mg total) by mouth daily with breakfast. 11/28/15  Yes Nishant Dhungel, MD  promethazine (PHENERGAN) 25 MG tablet TAKE 1 TABLET BY MOUTH EVERY 6 HOURS AS NEEDED FOR NAUSEA OR VOMITING 10/17/15  Yes Marletta Lor, MD  SPIRIVA HANDIHALER 18 MCG inhalation capsule PLACE 1 CAPSULE (18 MCG TOTAL) INTO INHALER AND INHALE DAILY. 05/13/15  Yes Marletta Lor, MD  triamcinolone cream (KENALOG) 0.1 % Apply 1 application topically daily as needed (affected area).  09/26/15  Yes Historical Provider, MD   sertraline (ZOLOFT) 50 MG tablet Take 0.5 tablets (25 mg total) by mouth daily. 01/16/16   Marletta Lor, MD   Allergies  Allergen Reactions  . Codeine Nausea And Vomiting    FAMILY HISTORY:  family history includes Diabetes in her father; Heart attack in her mother. There is no history of Colon cancer. SOCIAL HISTORY:  reports that she quit smoking about 15 years ago. Her smoking use included Cigarettes. She has a 60 pack-year smoking history. She has never used smokeless tobacco. She reports that she does not drink alcohol or use illicit drugs.  REVIEW OF SYSTEMS:   Constitutional: Negative for fever, chills, weight loss, malaise/fatigue and diaphoresis.  HENT: Negative for hearing loss, ear pain, nosebleeds, congestion, sore throat, neck pain, tinnitus and ear discharge.   Eyes: Negative for blurred vision, double vision, photophobia, pain, discharge and redness.  Respiratory: Negative for cough, hemoptysis, sputum production, shortness of breath, wheezing and stridor.   Cardiovascular: Negative for chest pain, palpitations, orthopnea, claudication, leg swelling and PND.  Gastrointestinal: Negative for heartburn, nausea, vomiting, abdominal pain, diarrhea, constipation, blood in stool and melena.  Genitourinary: Negative for dysuria, urgency, frequency, hematuria and flank pain.  Musculoskeletal: Negative for myalgias, back pain, joint pain and falls.  Skin: Negative for itching and rash.  Neurological: Negative  for dizziness, tingling, tremors, sensory change, speech change, focal weakness, seizures, loss of consciousness, weakness and headaches.  Endo/Heme/Allergies: Negative for environmental allergies and polydipsia. Does not bruise/bleed easily.  SUBJECTIVE:  75 Years old elderly female found sitting on the chair comfortably.  Is on 6l of Oxygen maintaining sats of 86-88%.Patient says she feels better today than before.   VITAL SIGNS: Temp:  [97.5 F (36.4 C)-98.5 F (36.9  C)] 98.5 F (36.9 C) (02/22 0538) Pulse Rate:  [65-72] 72 (02/22 0648) Resp:  [16-17] 17 (02/22 0538) BP: (130-154)/(33-47) 133/47 mmHg (02/22 0648) SpO2:  [79 %-95 %] 79 % (02/22 1313) FiO2 (%):  [40 %-44 %] 44 % (02/22 1300)  PHYSICAL EXAMINATION: General:  Elderly female  Found sitting comfortably on the chair Neuro:  Awake, alert,oriented,  HEENT: Atraumatic, normocephalic, White sclera, no dischage Cardiovascular:aFIB, no murmur ,gallop or rub noted Lungs: Bibasilar crackles ,no accessory muscle use Abdomen: soft ,non tender, good bowel sounds  Skin:  Intact   Recent Labs Lab 01/17/16 0558 01/18/16 0544 01/19/16 0639  NA 144 141 139  K 3.1* 3.9 4.1  CL 107 109 106  CO2 27 25 25   BUN 15 17 15   CREATININE 1.07* 0.95 0.98  GLUCOSE 170* 125* 117*    Recent Labs Lab 01/11/2016 1944 01/15/16 1348  HGB 8.3* 8.0*  HCT 28.6* 27.4*  WBC 10.0 8.4  PLT 210 165   No results found.  ASSESSMENT / PLAN:  Acute onChronic respiratory failure r/t COPD exacerbation, bronchitis,inffluenza  Continue doxycyline Increased Pulmicort to 0.5mg  BID Increase Ipratropium to 4 times daily. Continue albuterol  Continue Atrovent Continue using flutter valve. Continue Doxycycline and Tamiflu Diurese as tolerated by the patient. Continue guifenesin     Bincy Varughese,AG-ACNP Pulmonary & Critical Care Pul[facility] and [place] Pager: [phone]  01/21/2016, 2:03 PM   STAFF NOTE: I, Merrie Roof, MD FACP have personally reviewed patient's available data, including medical history, events of note, physical examination and test results as part of my evaluation. I have discussed with resident/NP and other care providers such as pharmacist, RN and RRT. In addition, I personally evaluated patient and elicited key findings of: no distress, has a cough, no fevers, WBC wnl, she is in chair, lack of progress, baseline COPD, int changes home O2 liters,  still on 6 liters now, r/o pneumonitis from flu, residual bacterial PNA, although remains without fever and is mobilizing secretions, she is pos daily , pcxr has NEW infiltrates on top chronic int changes, , increase lasix, increase solumedrol for now, goal neg 1 liter, get CT chest, get echo for sys murmur, can maintain doxy  X 8 days then dc, will follow , update pt at bedside  [name]. Titus Mould, MD, [place] Pgr: [place] [facility] 01/21/2016 4:06 PM

## 2016-01-22 ENCOUNTER — Inpatient Hospital Stay (HOSPITAL_COMMUNITY): Payer: Medicare Other

## 2016-01-22 DIAGNOSIS — J111 Influenza due to unidentified influenza virus with other respiratory manifestations: Secondary | ICD-10-CM

## 2016-01-22 DIAGNOSIS — R531 Weakness: Secondary | ICD-10-CM

## 2016-01-22 DIAGNOSIS — J9620 Acute and chronic respiratory failure, unspecified whether with hypoxia or hypercapnia: Secondary | ICD-10-CM

## 2016-01-22 DIAGNOSIS — R0600 Dyspnea, unspecified: Secondary | ICD-10-CM

## 2016-01-22 DIAGNOSIS — R7989 Other specified abnormal findings of blood chemistry: Secondary | ICD-10-CM

## 2016-01-22 DIAGNOSIS — I10 Essential (primary) hypertension: Secondary | ICD-10-CM

## 2016-01-22 LAB — HEPATIC FUNCTION PANEL
ALT: 110 U/L — ABNORMAL HIGH (ref 14–54)
AST: 57 U/L — ABNORMAL HIGH (ref 15–41)
Albumin: 2.2 g/dL — ABNORMAL LOW (ref 3.5–5.0)
Alkaline Phosphatase: 70 U/L (ref 38–126)
BILIRUBIN DIRECT: 0.3 mg/dL (ref 0.1–0.5)
BILIRUBIN INDIRECT: 0.5 mg/dL (ref 0.3–0.9)
Total Bilirubin: 0.8 mg/dL (ref 0.3–1.2)
Total Protein: 5.7 g/dL — ABNORMAL LOW (ref 6.5–8.1)

## 2016-01-22 LAB — BASIC METABOLIC PANEL
ANION GAP: 12 (ref 5–15)
BUN: 28 mg/dL — AB (ref 6–20)
CHLORIDE: 103 mmol/L (ref 101–111)
CO2: 28 mmol/L (ref 22–32)
Calcium: 8.5 mg/dL — ABNORMAL LOW (ref 8.9–10.3)
Creatinine, Ser: 1.13 mg/dL — ABNORMAL HIGH (ref 0.44–1.00)
GFR, EST AFRICAN AMERICAN: 54 mL/min — AB (ref >60)
GFR, EST NON AFRICAN AMERICAN: 47 mL/min — AB (ref >60)
Glucose, Bld: 179 mg/dL — ABNORMAL HIGH (ref 65–99)
POTASSIUM: 3.6 mmol/L (ref 3.5–5.1)
SODIUM: 143 mmol/L (ref 135–145)

## 2016-01-22 LAB — CBC
HCT: 25.6 % — ABNORMAL LOW (ref 36.0–46.0)
Hemoglobin: 7.6 g/dL — ABNORMAL LOW (ref 12.0–15.0)
MCH: 23.5 pg — AB (ref 26.0–34.0)
MCHC: 29.7 g/dL — ABNORMAL LOW (ref 30.0–36.0)
MCV: 79.3 fL (ref 78.0–100.0)
PLATELETS: 241 10*3/uL (ref 150–400)
RBC: 3.23 MIL/uL — ABNORMAL LOW (ref 3.87–5.11)
RDW: 17.6 % — AB (ref 11.5–15.5)
WBC: 11.6 10*3/uL — AB (ref 4.0–10.5)

## 2016-01-22 LAB — GLUCOSE, CAPILLARY
GLUCOSE-CAPILLARY: 158 mg/dL — AB (ref 65–99)
Glucose-Capillary: 135 mg/dL — ABNORMAL HIGH (ref 65–99)
Glucose-Capillary: 175 mg/dL — ABNORMAL HIGH (ref 65–99)
Glucose-Capillary: 140 mg/dL — ABNORMAL HIGH (ref 65–99)

## 2016-01-22 NOTE — Consult Note (Signed)
Name: [name] MRN: [id number] DOB: [birth year]    ADMISSION DATE:  01/11/2016 CONSULTATION DATE:  01/21/16  REFERRING MD :  Wardell Heath  CHIEF COMPLAINT:  SOB  BRIEF PATIENT DESCRIPTION: [name] is a 75 y.o. female with history of paroxysmal atrial fibrillation, CAD status post CABG, COPD, hypertension and diabetes mellitus type 2 and fall on 2/15 and was brought to the ER  On 2/16  For the patient's daughter found the patient was weak and had some slurred speech. Patient uses 2 liters of oxygen at home. Patient is currently diagnosed with influenza and currently getting treated  With Tamiflu and Doxycycline. On 2/22/ while working with the PT her O2 dropped down to 79% from 87-89% then maintained in between 82-85%.  Therefore PCCM team was consulted on 01/21/16  SIGNIFICANT EVENTS  ED  2/16 with increased weakness and slurred speech  STUDIES:  CT head >2/16 was neg Influenza A >2/16 positive CT chest 2/23>>>Emphysematous changes with superimposed interstitial infiltrates   HISTORY OF PRESENT ILLNESS:   [name] is a 75 y.o. female with history of paroxysmal atrial fibrillation, CAD status post CABG, COPD, hypertension and diabetes mellitus type 2 and fall on 2/15 and was brought to the ER  On 2/16  For the patient's daughter found the patient was weak and had some slurred speech. Patient uses 2 liters of oxygen at home. Patient is currently diagnosed with influenza and currently getting treated  With Tamiflu and Doxycycline. On 2/22/ while working with the PT her O2 dropped down to 79% from 87-89% then maintained in between 82-85%.  Patient denies smoking(says quit 21 years ago), cough is productive in nature, moderate amount,yellow in color.   SUBJECTIVE:  No distress, remains with some hypoxia  VITAL SIGNS: Temp:  [97.7 F (36.5 C)-97.8 F (36.6 C)] 97.8 F (36.6 C) (02/23 0519) Pulse Rate:  [65-67] 67 (02/23 0519) Resp:  [18] 18 (02/23 0519) BP:  (112-117)/(40-49) 117/49 mmHg (02/23 0519) SpO2:  [82 %-95 %] 95 % (02/23 1339) FiO2 (%):  [50 %-100 %] 100 % (02/23 1339)  PHYSICAL EXAMINATION: General:  Elderly female  No distress Neuro:  Awake, alert,oriented, nonfocal HEENT: jvd down Cardiovascular:aFIB, no murmur ,gallop or rub noted Lungs: Bibasilar crackles dry Abdomen: soft ,non tender, good bowel sounds  Skin:  Intact   Recent Labs Lab 01/18/16 0544 01/19/16 0639 01/22/16 0600  NA 141 139 143  K 3.9 4.1 3.6  CL 109 106 103  CO2 '25 25 28  '$ BUN 17 15 28*  CREATININE 0.95 0.98 1.13*  GLUCOSE 125* 117* 179*    Recent Labs Lab 01/22/16 0600  HGB 7.6*  HCT 25.6*  WBC 11.6*  PLT 241   Ct Chest Wo Contrast  01/21/2016  CLINICAL DATA:  Influenza, hypoxia EXAM: CT CHEST WITHOUT CONTRAST TECHNIQUE: Multidetector CT imaging of the chest was performed following the standard protocol without IV contrast. COMPARISON:  07/30/2010 FINDINGS: The lungs are well aerated bilaterally. Diffuse emphysematous changes are noted throughout both lungs which have progressed in the interval from the prior exam. Additionally there is diffuse infiltrative density throughout both lungs similar to that seen on recent plain film examination. No focal parenchymal nodules are noted. Small pleural effusions are noted. The thoracic inlet is within normal limits. Heavy calcification of the thoracic aorta is noted. Heavy coronary calcifications are noted. Changes of prior coronary bypass grafting are seen. No significant hilar or mediastinal adenopathy is noted. The visualized upper abdomen is  within normal limits. The osseous structures are grossly unremarkable. IMPRESSION: Emphysematous changes with superimposed interstitial infiltrates. No focal confluent infiltrate is noted. Small pleural effusions are seen. Electronically Signed   By: Inez Catalina M.D.   On: 01/21/2016 19:07    ASSESSMENT / PLAN: Acute onChronic respiratory failure r/t COPD  exacerbation, bronchitis,inffluenza pneumonitis / ALI Aib rvr on prior amio Mild Overdiuresis  Continue doxycyline but dc at day 8 Pulmicort to 0.'5mg'$  BID Increase Ipratropium to 4 times daily. Continue albuterol  Continue Atrovent flutter valve Diurese dc Continue guifenesin Remains on 6 liters, CT reviewed, looks like she has a pneumonitis on top of her advancing Emphysema noted on CT 2011 Would continued steroids and likely prolong use, likely reduce dose in am  Would NOT start amio back, no role esr with flu may have been up anyway likely this is additional pneumonitis from flu on top of known lung dz , now ALI on top May end up with higher fio2 needs at [place]. Titus Mould, MD, [place] Pgr: [place] [facility]

## 2016-01-22 NOTE — Clinical Social Work Note (Signed)
Clinical Social Work Assessment  Patient Details  Name: [name] MRN: [id number] Date of Birth: 1940-12-06  Date of referral:  01/22/16               Reason for consult:  Facility Placement                Permission sought to share information with:  Facility [name], Family Supports Permission granted to share information::  Yes, Verbal Permission Granted  Name::     [name]  Agency::  [name]  Relationship::  Daughter  Contact Information:  [phone]  Housing/Transportation Living arrangements for the past 2 months:  [place] of Information:  Patient Patient Interpreter Needed:  None Criminal Activity/Legal Involvement Pertinent to Current Situation/Hospitalization:  No - Comment as needed Significant Relationships:  Adult Children Lives with:  Self Do you feel safe going back to the place where you live?  Yes Need for family participation in patient care:  Yes (Comment)  Care giving concerns:  CSW received referral for possible SNF placement at time of discharge. CSW met with patient regarding possibility of SNF placement at time of discharge. Patient lives alone and may currently be unable to care for herself given patient's current physical needs. CSW to continue to follow and assist with discharge planning needs.   Social Worker assessment / plan:  CSW spoke with patient concerning possibility of rehab at [facility] before returning home.  Employment status:  Retired Nurse, adult PT Recommendations:  [place], Home with [place] / Referral to community resources:     Patient/Family's Response to care:  Patient stated she would like to go home at discharge with home health services and she is hoping to be able to improve while she is at the hospital. She stated that she is willing to go to rehab for a few days if necessary.   Patient/Family's Understanding of and Emotional  Response to Diagnosis, Current Treatment, and Prognosis:  Patient is realistic regarding therapy needs. No questions/concerns about plan or treatment.    Emotional Assessment Appearance:  Appears stated age Attitude/Demeanor/Rapport:   (Appropriate) Affect (typically observed):  Accepting, Appropriate, Pleasant Orientation:  Oriented to Self, Oriented to Place, Oriented to  Time, Oriented to Situation Alcohol / Substance use:  Not Applicable Psych involvement (Current and /or in the community):  No (Comment)  Discharge Needs  Concerns to be addressed:  No discharge needs identified Readmission within the last 30 days:  No Current discharge risk:  None Barriers to Discharge:  Continued Medical Work up   [name], [place] 01/22/2016, 5:32 PM

## 2016-01-22 NOTE — Progress Notes (Signed)
Occupational Therapy Treatment Patient Details Name: LYRIA LEYDEN MRN: [id number] DOB: 02-08-1941 Today's Date: 01/22/2016    History of present illness 75 y.o. female with history of paroxysmal atrial fibrillation, CAD status post CABG, COPD, hypertension and diabetes mellitus type 2. She was brought to the ER by patient's daughter due to weakness, decreased O2 sats, and fall at home. Pt admitted for acute on chronic respiratory failure due to COPD exacerbation, bronchitis, and influenza.    OT comments  This 75 yo female admitted with above presents to acute OT not showing progress today due to increased needs for O2 and deconditioning. OT continues to recommend SNF. Since pt is deconditoned and lives alone.  Follow Up Recommendations  SNF    Equipment Recommendations  None recommended by OT       Precautions / Restrictions Precautions Precautions: Fall Precaution Comments: watch O2 sats--asymtomatic to drop, but pt states she does realize she is much weaker than when she came into hospital. Pt on venti-mask (01/22/16) 15 liters with sats 87-97% (with talking and bed<>recliner transfers and LBD) Restrictions Weight Bearing Restrictions: No       Mobility Bed Mobility Overal bed mobility: Modified Independent       Supine to sit: Modified independent (Device/Increase time);HOB elevated (increased time)        Transfers Overall transfer level: Needs assistance Equipment used: 1 person hand held assist Transfers: Sit to/from Stand Sit to Stand: Min assist Stand pivot transfers: Min assist (bed>recliner)            Balance Overall balance assessment: Needs assistance Sitting-balance support: Feet supported;No upper extremity supported Sitting balance-Leahy Scale: Good     Standing balance support: Single extremity supported Standing balance-Leahy Scale: Poor Standing balance comment: requires one hand on something to stedy herself                    ADL Overall ADL's : Needs assistance/impaired                     Lower Body Dressing: Min guard;Sit to/from stand   Toilet Transfer: Min guard;Stand-pivot;BSC                              Cognition   Behavior During Therapy: [facility] for tasks assessed/performed Overall Cognitive Status: Within Functional Limits for tasks assessed                   Pertinent Vitals/ Pain       Pain Assessment: No/denies pain            Progress Toward Goals  OT Goals(current goals can now be found in the care plan section)  Progress towards OT goals: Not progressing toward goals - comment (same as less session--pt continues with issues with O2 sats and is deconditioned)     Plan Discharge plan needs to be updated       End of Session Equipment Utilized During Treatment: Oxygen (venti mask--15 liters)   Activity Tolerance Patient limited by fatigue   Patient Left in chair;with call bell/phone within reach;with chair alarm set   Nurse Communication  (sat levels while I was working with pt and I am recommending SNF now)        Time: 1339-1411 OT Time Calculation (min): 32 min  Charges: OT General Charges $OT Visit: 1 Procedure OT Treatments $Self Care/Home Management : 23-37 mins  Almon Register  [id number] 01/22/2016, 2:27 PM

## 2016-01-22 NOTE — Progress Notes (Signed)
  Echocardiogram 2D Echocardiogram has been performed.  Yaacov Koziol 01/22/2016, 9:12 AM

## 2016-01-22 NOTE — Progress Notes (Signed)
Got report from Night Rn and patient was visually sat in 83-85% on [place].  Patient had labored breathing.  Called Respiratory for a second look was asked to put pt on venti mask for now at 55%.   At 10 am patient was sat at 78-82 on venti mask.  Rapid called and suggested non rebreather.  Respiratory called and Md paged.  Patient came up to 95% on non rebreather.  Patient seems comfortable at this time

## 2016-01-22 NOTE — Progress Notes (Addendum)
PROGRESS NOTE  [name] [id number] DOB: 05/26/1941 DOA: 01/21/2016 PCP: Nyoka Cowden, MD Outpatient Specialists:    LOS: 7 days   Brief Narrative: 75 year old female with history of paroxysmal A. fib, coronary artery disease, COPD, hypertension, diabetes, admitted on 2/16 with hypoxic respiratory failure found to have influenza, started on Tamiflu as well as antibiotics for community-acquired pneumonia.  Assessment & Plan: Principal Problem:   Generalized weakness Active Problems:   Essential hypertension   PVD, s/p AOBF '04, Lt SCA BPG 9/05   Diabetes mellitus type 2, controlled (HCC)   PAF (paroxysmal atrial fibrillation) (HCC)   Weakness   Acute on chronic respiratory failure (HCC)   Elevated troponin   Influenza   SOB (shortness of breath)   Acute on chronic hypoxic respiratory failure - Likely in the setting of COPD/sedation, bronchitis as well as influenza - She completed treatment with Tamiflu, currently is on doxycycline - Patient without improvement, consulted pulmonology 2/22, appreciate input.  - without significant improvement she is on a NRB today satting low 90s. Asymptomatic.   Coronary artery disease status post CABG - Currently without chest pain - Mild elevation of her troponins appears to be secondary to demand ischemia - EKG without acute ischemic changes  - 2D echo with normal EF and grade 2 DD  AKI  - likely due to Lasix, closely monitor  History of paroxysmal atrial fibrillation  - Presented on bradycardia and sinus currently. Currently on B blocker, holding Cardizem andamiodarone (last one secondary to transaminitis).  - Continue monitoring on telemetry and continue apixaban  Chronic anemia - Hemoglobin currently stable, no bleeding  Chronic diastolic heart failure - No significant evidence of fluid overload on physical exam or chest x-ray  Depression/anxiety - Continue Zoloft   Hyperlipidemia - Holding Lipitor  secondary to transaminitis  Transaminitis - Unclear etiology at this moment, may be related to her statin - Amiodarone has been held, however it's unlikely to be out of her system, her LFTs appear to be improving  Diabetes with hyperglycemia -secondary to steroids  -continue SSI  Hypokalemia - Replace and reassess   DVT prophylaxis: Eliquis Code Status: Full Family Communication: no family bedside today  Disposition Plan: home when ready  Barriers for discharge: hypoxia  Consultants:   Pulmonary   Procedures:   2D echo: EF 50-55%, grade 2 DD  Antimicrobials:  Tamiflu 2/16 >> 2/21  Doxycycline 2/17 >> with plans for 8 days, today day 6/8  Subjective: - appreciates improvement however on just discussing with the patient her oxygen saturation dips easily into the mid 80s. Denies chest pain / palpitations  Objective: Filed Vitals:   01/22/16 0811 01/22/16 1000 01/22/16 1028 01/22/16 1029  BP:      Pulse:      Temp:      TempSrc:      Resp:      Height:      Weight:      SpO2: 92% 82% 86% 93%    Intake/Output Summary (Last 24 hours) at 01/22/16 1301 Last data filed at 01/22/16 1233  Gross per 24 hour  Intake      0 ml  Output   1725 ml  Net  -1725 ml   Filed Weights   01/16/16 0406 01/17/16 0346 01/19/16 0511  Weight: 68.13 kg (150 lb 3.2 oz) 64.501 kg (142 lb 3.2 oz) 69 kg (152 lb 1.9 oz)    Examination: BP 117/49 mmHg  Pulse 67  Temp(Src) 97.8 F (36.6 C) (  Oral)  Resp 18  Ht 5\' 2"  (1.575 m)  Wt 69 kg (152 lb 1.9 oz)  BMI 27.82 kg/m2  SpO2 93% General exam: NAD Respiratory system: Clear. Mild increased work of breathing. + wheezing Cardiovascular system: regular rate and rhythm, 3/6 SEM. No JVD. No peripheral edema.  Gastrointestinal system: Abdomen is nondistended, soft and nontender. Normal bowel sounds heard. Central nervous system: AxOx3. No focal deficits Extremities: No clubbing/cyanosis Skin: no rashes   Data Reviewed: I have  personally reviewed following labs and imaging studies  CBC:  Recent Labs Lab 01/15/16 1348 01/22/16 0600  WBC 8.4 11.6*  HGB 8.0* 7.6*  HCT 27.4* 25.6*  MCV 78.7 79.3  PLT 165 [id number]   Basic Metabolic Panel:  Recent Labs Lab 01/15/16 1348 01/17/16 0558 01/18/16 0544 01/19/16 0639 01/22/16 0600  NA 138 144 141 139 143  K 4.0 3.1* 3.9 4.1 3.6  CL 104 107 109 106 103  CO2 22 27 25 25 28   GLUCOSE 145* 170* 125* 117* 179*  BUN 28* 15 17 15  28*  CREATININE 1.37* 1.07* 0.95 0.98 1.13*  CALCIUM 8.4* 8.5* 8.7* 8.8* 8.5*   GFR: Estimated Creatinine Clearance: 39.8 mL/min (by C-G formula based on Cr of 1.13). Liver Function Tests:  Recent Labs Lab 01/15/16 1348 01/17/16 0558 01/18/16 0544 01/19/16 0639 01/22/16 0600  AST 1078* 331* 179* 102* 57*  ALT 595* 413* 299* 231* 110*  ALKPHOS 95 75 70 78 70  BILITOT 0.6 1.0 0.8 1.2 0.8  PROT 5.9* 5.6* 5.2* 5.8* 5.7*  ALBUMIN 2.9* 2.5* 2.3* 2.4* 2.2*   No results for input(s): LIPASE, AMYLASE in the last 168 hours. No results for input(s): AMMONIA in the last 168 hours. Coagulation Profile: No results for input(s): INR, PROTIME in the last 168 hours. Cardiac Enzymes:  Recent Labs Lab 01/15/16 1348 01/15/16 1542 01/15/16 1940 01/15/16 2213  CKTOTAL 388*  --   --   --   TROPONINI 0.12* 0.11* 0.10* 0.08*   BNP (last 3 results)  Recent Labs  05/21/15 0835  PROBNP 140.0*   HbA1C: No results for input(s): HGBA1C in the last 72 hours. CBG:  Recent Labs Lab 01/21/16 1215 01/21/16 1714 01/21/16 2112 01/22/16 0802 01/22/16 1224  GLUCAP 127* 238* 145* 135* 158*   Lipid Profile: No results for input(s): CHOL, HDL, LDLCALC, TRIG, CHOLHDL, LDLDIRECT in the last 72 hours. Thyroid Function Tests: No results for input(s): TSH, T4TOTAL, FREET4, T3FREE, THYROIDAB in the last 72 hours. Anemia Panel: No results for input(s): VITAMINB12, FOLATE, FERRITIN, TIBC, IRON, RETICCTPCT in the last 72 hours. Urine analysis:      Component Value Date/Time   COLORURINE YELLOW 01/15/2016 1517   APPEARANCEUR CLOUDY* 01/15/2016 1517   LABSPEC 1.015 01/15/2016 1517   PHURINE 6.0 01/15/2016 1517   GLUCOSEU NEGATIVE 01/15/2016 1517   GLUCOSEU NEGATIVE 02/18/2015 0913   HGBUR NEGATIVE 01/15/2016 1517   HGBUR negative 09/01/2009 [place] 01/15/2016 1517   BILIRUBINUR n 07/18/2013 [place] 01/15/2016 1517   PROTEINUR 30* 01/15/2016 1517   PROTEINUR n 07/18/2013 1604   UROBILINOGEN 0.2 02/18/2015 0913   UROBILINOGEN 1.0 07/18/2013 1604   NITRITE POSITIVE* 01/15/2016 1517   NITRITE positive 07/18/2013 1604   LEUKOCYTESUR NEGATIVE 01/15/2016 1517   Sepsis Labs: Invalid input(s): PROCALCITONIN, LACTICIDVEN  No results found for this or any previous visit (from the past 240 hour(s)).    Radiology Studies: Ct Chest Wo Contrast  01/21/2016  CLINICAL DATA:  Influenza, hypoxia  EXAM: CT CHEST WITHOUT CONTRAST TECHNIQUE: Multidetector CT imaging of the chest was performed following the standard protocol without IV contrast. COMPARISON:  07/30/2010 FINDINGS: The lungs are well aerated bilaterally. Diffuse emphysematous changes are noted throughout both lungs which have progressed in the interval from the prior exam. Additionally there is diffuse infiltrative density throughout both lungs similar to that seen on recent plain film examination. No focal parenchymal nodules are noted. Small pleural effusions are noted. The thoracic inlet is within normal limits. Heavy calcification of the thoracic aorta is noted. Heavy coronary calcifications are noted. Changes of prior coronary bypass grafting are seen. No significant hilar or mediastinal adenopathy is noted. The visualized upper abdomen is within normal limits. The osseous structures are grossly unremarkable. IMPRESSION: Emphysematous changes with superimposed interstitial infiltrates. No focal confluent infiltrate is noted. Small pleural effusions  are seen. Electronically Signed   By: Inez Catalina M.D.   On: 01/21/2016 19:07     Scheduled Meds: . antiseptic oral rinse  7 mL Mouth Rinse BID  . apixaban  5 mg Oral BID  . aspirin EC  81 mg Oral Daily  . budesonide (PULMICORT) nebulizer solution  0.5 mg Nebulization BID  . doxycycline  100 mg Oral Q12H  . furosemide  40 mg Intravenous Q6H  . gabapentin  600 mg Oral TID  . hydrALAZINE  10 mg Oral 3 times per day  . insulin aspart  0-9 Units Subcutaneous TID WC  . ipratropium-albuterol  3 mL Nebulization TID  . LORazepam  0.5 mg Oral BID  . methylPREDNISolone (SOLU-MEDROL) injection  60 mg Intravenous Q12H  . metoprolol tartrate  12.5 mg Oral BID  . potassium chloride  10 mEq Oral Daily  . sertraline  25 mg Oral Daily   Continuous Infusions:     Marzetta Board, MD, PhD [facility] Pager [phone]  If 7PM-7AM, please contact night-coverage [email or web address] Password [email or web address] 01/22/2016, 1:01 PM

## 2016-01-23 DIAGNOSIS — J9621 Acute and chronic respiratory failure with hypoxia: Secondary | ICD-10-CM

## 2016-01-23 LAB — BASIC METABOLIC PANEL
ANION GAP: 13 (ref 5–15)
BUN: 33 mg/dL — ABNORMAL HIGH (ref 6–20)
CALCIUM: 9.2 mg/dL (ref 8.9–10.3)
CO2: 30 mmol/L (ref 22–32)
Chloride: 99 mmol/L — ABNORMAL LOW (ref 101–111)
Creatinine, Ser: 1.07 mg/dL — ABNORMAL HIGH (ref 0.44–1.00)
GFR, EST AFRICAN AMERICAN: 58 mL/min — AB (ref >60)
GFR, EST NON AFRICAN AMERICAN: 50 mL/min — AB (ref >60)
GLUCOSE: 239 mg/dL — AB (ref 65–99)
Potassium: 3.5 mmol/L (ref 3.5–5.1)
SODIUM: 142 mmol/L (ref 135–145)

## 2016-01-23 LAB — GLUCOSE, CAPILLARY
GLUCOSE-CAPILLARY: 118 mg/dL — AB (ref 65–99)
GLUCOSE-CAPILLARY: 183 mg/dL — AB (ref 65–99)
GLUCOSE-CAPILLARY: 193 mg/dL — AB (ref 65–99)
GLUCOSE-CAPILLARY: 97 mg/dL (ref 65–99)

## 2016-01-23 LAB — BLOOD GAS, ARTERIAL
Acid-Base Excess: 5.6 mmol/L — ABNORMAL HIGH (ref 0.0–2.0)
Bicarbonate: 29 mEq/L — ABNORMAL HIGH (ref 20.0–24.0)
DRAWN BY: 277551
FIO2: 1
O2 Saturation: 95.3 %
PATIENT TEMPERATURE: 98.6
PH ART: 7.495 — AB (ref 7.350–7.450)
TCO2: 30.2 mmol/L (ref 0–100)
pCO2 arterial: 38 mmHg (ref 35.0–45.0)
pO2, Arterial: 82.1 mmHg (ref 80.0–100.0)

## 2016-01-23 LAB — CBC
HCT: 28.7 % — ABNORMAL LOW (ref 36.0–46.0)
HEMOGLOBIN: 8.1 g/dL — AB (ref 12.0–15.0)
MCH: 22.1 pg — ABNORMAL LOW (ref 26.0–34.0)
MCHC: 28.2 g/dL — AB (ref 30.0–36.0)
MCV: 78.4 fL (ref 78.0–100.0)
Platelets: 311 10*3/uL (ref 150–400)
RBC: 3.66 MIL/uL — ABNORMAL LOW (ref 3.87–5.11)
RDW: 17.5 % — ABNORMAL HIGH (ref 11.5–15.5)
WBC: 16.4 10*3/uL — AB (ref 4.0–10.5)

## 2016-01-23 LAB — MRSA PCR SCREENING: MRSA BY PCR: NEGATIVE

## 2016-01-23 MED ORDER — SENNOSIDES-DOCUSATE SODIUM 8.6-50 MG PO TABS
2.0000 | ORAL_TABLET | Freq: Once | ORAL | Status: AC
  Administered 2016-01-23: 2 via ORAL
  Filled 2016-01-23: qty 2

## 2016-01-23 MED ORDER — POLYETHYLENE GLYCOL 3350 17 G PO PACK
17.0000 g | PACK | Freq: Two times a day (BID) | ORAL | Status: DC
  Administered 2016-01-23 – 2016-01-26 (×7): 17 g via ORAL
  Filled 2016-01-23 (×7): qty 1

## 2016-01-23 MED ORDER — GLYCERIN (LAXATIVE) 2.1 G RE SUPP
1.0000 | Freq: Every day | RECTAL | Status: DC | PRN
  Filled 2016-01-23: qty 1

## 2016-01-23 NOTE — Progress Notes (Signed)
Nurse notified daughter regarding pt being transferred to stepdown unit today.

## 2016-01-23 NOTE — Progress Notes (Addendum)
PROGRESS NOTE  [name] [id number] DOB: 06-01-1941 DOA: 01/24/2016 PCP: Nyoka Cowden, MD Outpatient Specialists:    LOS: 8 days   Brief Narrative: 75 year old female with history of paroxysmal A. fib, coronary artery disease, COPD, hypertension, diabetes, admitted on 2/16 with hypoxic respiratory failure found to have influenza, started on Tamiflu as well as antibiotics for community-acquired pneumonia.  Assessment & Plan: Principal Problem:   Generalized weakness Active Problems:   Essential hypertension   PVD, s/p AOBF '04, Lt SCA BPG 9/05   Diabetes mellitus type 2, controlled ([place])   PAF (paroxysmal atrial fibrillation) (HCC)   Weakness   Acute on chronic respiratory failure (HCC)   Elevated troponin   Influenza   SOB (shortness of breath)   Acute on chronic hypoxic respiratory failure - Overall she is getting worse, she's been on nonrebreather throughout the morning, will transfer to stepdown. Discussed with Dr. Titus Mould from pulmonology. - Likely in the setting of COPD/sedation, bronchitis as well as influenza, and with potential interstitial process seen on the CT scan yesterday - She completed treatment with Tamiflu, currently is on doxycycline  Coronary artery disease status post CABG - Currently without chest pain - Mild elevation of her troponins appears to be secondary to demand ischemia - EKG without acute ischemic changes  - 2D echo with normal EF and grade 2 DD  AKI  - likely due to Lasix, closely monitor, repeat a BMP this morning  History of paroxysmal atrial fibrillation  - Presented on bradycardia and sinus currently. Currently on B blocker, holding Cardizem andamiodarone (last one secondary to transaminitis as well as potential interstitial lung disease).  - Continue monitoring on telemetry and continue apixaban  Chronic anemia - Hemoglobin currently stable, no bleeding  Chronic diastolic heart failure - No significant  evidence of fluid overload on physical exam or chest x-ray  Depression/anxiety - Continue Zoloft   Hyperlipidemia - Holding Lipitor secondary to transaminitis  Transaminitis - Unclear etiology at this moment, may be related to her statin - Amiodarone has been held, however it's unlikely to be out of her system, her LFTs appear to be improving  Diabetes with hyperglycemia -secondary to steroids  -continue SSI  Hypokalemia - Replace and reassess   DVT prophylaxis: Eliquis Code Status: Full Family Communication: no family bedside today  Disposition Plan: Transfer to stepdown Barriers for discharge: hypoxia  Consultants:   Pulmonary   Procedures:   2D echo: EF 50-55%, grade 2 DD  Antimicrobials:  Tamiflu 2/16 >> 2/21  Doxycycline 2/17 >> with plans for 8 days, today day 7/8  Subjective: - Her nonrebreather this morning, satting around 90%. Denies dyspnea if she standing still, however endorses shortness of breath with minimal activity and also when she comes off of her mask to eat  Objective: Filed Vitals:   01/23/16 0924 01/23/16 0948 01/23/16 1200 01/23/16 1225  BP:  131/41    Pulse:  65    Temp:      TempSrc:      Resp:      Height:      Weight:      SpO2: 96% 91% 98% 73%    Intake/Output Summary (Last 24 hours) at 01/23/16 1348 Last data filed at 01/23/16 0858  Gross per 24 hour  Intake      0 ml  Output    700 ml  Net   -700 ml   Filed Weights   01/16/16 0406 01/17/16 0346 01/19/16 0511  Weight: 68.13 kg (150  lb 3.2 oz) 64.501 kg (142 lb 3.2 oz) 69 kg (152 lb 1.9 oz)    Examination: BP 131/41 mmHg  Pulse 65  Temp(Src) 98.2 F (36.8 C) (Oral)  Resp 18  Ht 5\' 2"  (1.575 m)  Wt 69 kg (152 lb 1.9 oz)  BMI 27.82 kg/m2  SpO2 73% General exam: NAD Respiratory system: Clear. Mild increased work of breathing. No wheezing Cardiovascular system: regular rate and rhythm, 3/6 SEM. No JVD. No peripheral edema.  Gastrointestinal system: Abdomen is  nondistended, soft and nontender. Normal bowel sounds heard. Central nervous system: AxOx3. No focal deficits Extremities: No clubbing/cyanosis Skin: no rashes  Data Reviewed: I have personally reviewed following labs and imaging studies  CBC:  Recent Labs Lab 01/22/16 0600  WBC 11.6*  HGB 7.6*  HCT 25.6*  MCV 79.3  PLT [id number]   Basic Metabolic Panel:  Recent Labs Lab 01/17/16 0558 01/18/16 0544 01/19/16 0639 01/22/16 0600  NA 144 141 139 143  K 3.1* 3.9 4.1 3.6  CL 107 109 106 103  CO2 27 25 25 28   GLUCOSE 170* 125* 117* 179*  BUN 15 17 15  28*  CREATININE 1.07* 0.95 0.98 1.13*  CALCIUM 8.5* 8.7* 8.8* 8.5*   GFR: Estimated Creatinine Clearance: 39.8 mL/min (by C-G formula based on Cr of 1.13). Liver Function Tests:  Recent Labs Lab 01/17/16 0558 01/18/16 0544 01/19/16 0639 01/22/16 0600  AST 331* 179* 102* 57*  ALT 413* 299* 231* 110*  ALKPHOS 75 70 78 70  BILITOT 1.0 0.8 1.2 0.8  PROT 5.6* 5.2* 5.8* 5.7*  ALBUMIN 2.5* 2.3* 2.4* 2.2*   No results for input(s): LIPASE, AMYLASE in the last 168 hours. No results for input(s): AMMONIA in the last 168 hours. Coagulation Profile: No results for input(s): INR, PROTIME in the last 168 hours. Cardiac Enzymes: No results for input(s): CKTOTAL, CKMB, CKMBINDEX, TROPONINI in the last 168 hours. BNP (last 3 results)  Recent Labs  05/21/15 0835  PROBNP 140.0*   HbA1C: No results for input(s): HGBA1C in the last 72 hours. CBG:  Recent Labs Lab 01/22/16 1224 01/22/16 1710 01/22/16 2147 01/23/16 0757 01/23/16 1153  GLUCAP 158* 175* 140* 118* 183*   Lipid Profile: No results for input(s): CHOL, HDL, LDLCALC, TRIG, CHOLHDL, LDLDIRECT in the last 72 hours. Thyroid Function Tests: No results for input(s): TSH, T4TOTAL, FREET4, T3FREE, THYROIDAB in the last 72 hours. Anemia Panel: No results for input(s): VITAMINB12, FOLATE, FERRITIN, TIBC, IRON, RETICCTPCT in the last 72 hours. Urine analysis:      Component Value Date/Time   COLORURINE YELLOW 01/15/2016 1517   APPEARANCEUR CLOUDY* 01/15/2016 1517   LABSPEC 1.015 01/15/2016 1517   PHURINE 6.0 01/15/2016 1517   GLUCOSEU NEGATIVE 01/15/2016 1517   GLUCOSEU NEGATIVE 02/18/2015 0913   HGBUR NEGATIVE 01/15/2016 1517   HGBUR negative 09/01/2009 [place] 01/15/2016 1517   BILIRUBINUR n 07/18/2013 [place] 01/15/2016 1517   PROTEINUR 30* 01/15/2016 1517   PROTEINUR n 07/18/2013 1604   UROBILINOGEN 0.2 02/18/2015 0913   UROBILINOGEN 1.0 07/18/2013 1604   NITRITE POSITIVE* 01/15/2016 1517   NITRITE positive 07/18/2013 1604   LEUKOCYTESUR NEGATIVE 01/15/2016 1517   Sepsis Labs: Invalid input(s): PROCALCITONIN, LACTICIDVEN  No results found for this or any previous visit (from the past 240 hour(s)).    Radiology Studies: Ct Chest Wo Contrast  01/21/2016  CLINICAL DATA:  Influenza, hypoxia EXAM: CT CHEST WITHOUT CONTRAST TECHNIQUE: Multidetector CT imaging of the chest was performed  following the standard protocol without IV contrast. COMPARISON:  07/30/2010 FINDINGS: The lungs are well aerated bilaterally. Diffuse emphysematous changes are noted throughout both lungs which have progressed in the interval from the prior exam. Additionally there is diffuse infiltrative density throughout both lungs similar to that seen on recent plain film examination. No focal parenchymal nodules are noted. Small pleural effusions are noted. The thoracic inlet is within normal limits. Heavy calcification of the thoracic aorta is noted. Heavy coronary calcifications are noted. Changes of prior coronary bypass grafting are seen. No significant hilar or mediastinal adenopathy is noted. The visualized upper abdomen is within normal limits. The osseous structures are grossly unremarkable. IMPRESSION: Emphysematous changes with superimposed interstitial infiltrates. No focal confluent infiltrate is noted. Small pleural effusions  are seen. Electronically Signed   By: Inez Catalina M.D.   On: 01/21/2016 19:07   Scheduled Meds: . antiseptic oral rinse  7 mL Mouth Rinse BID  . apixaban  5 mg Oral BID  . aspirin EC  81 mg Oral Daily  . budesonide (PULMICORT) nebulizer solution  0.5 mg Nebulization BID  . doxycycline  100 mg Oral Q12H  . gabapentin  600 mg Oral TID  . hydrALAZINE  10 mg Oral 3 times per day  . insulin aspart  0-9 Units Subcutaneous TID WC  . ipratropium-albuterol  3 mL Nebulization TID  . LORazepam  0.5 mg Oral BID  . methylPREDNISolone (SOLU-MEDROL) injection  60 mg Intravenous Q12H  . metoprolol tartrate  12.5 mg Oral BID  . polyethylene glycol  17 g Oral BID  . potassium chloride  10 mEq Oral Daily  . sertraline  25 mg Oral Daily   Continuous Infusions:   Time spent: 35 minutes, more than 50% bedside discussing with patient and her daughter.  Marzetta Board, MD, PhD [facility] Pager [phone]  If 7PM-7AM, please contact night-coverage [email or web address] Password [email or web address] 01/23/2016, 1:48 PM

## 2016-01-23 NOTE — Progress Notes (Signed)
Name: [name] MRN: [id number] DOB: [birth year]    ADMISSION DATE:  01/10/2016 CONSULTATION DATE:  01/21/16  REFERRING MD :  Wardell Heath  CHIEF COMPLAINT:  SOB  BRIEF PATIENT DESCRIPTION: [name] is a 75 y.o. female with history of paroxysmal atrial fibrillation, CAD status post CABG, COPD, hypertension and diabetes mellitus type 2 and fall on 2/15 and was brought to the ER  On 2/16  For the patient's daughter found the patient was weak and had some slurred speech. Patient uses 2 liters of oxygen at home. Patient is currently diagnosed with influenza and currently getting treated  With Tamiflu and Doxycycline. On 2/22/ while working with the PT her O2 dropped down to 79% from 87-89% then maintained in between 82-85%.  Therefore PCCM team was consulted on 01/21/16  SIGNIFICANT EVENTS and STUDIES  ED  2/16 with increased weakness and slurred speech CT head >2/16 was neg Influenza A >2/16 positive CT chest 2/23>>>Emphysematous changes with superimposed interstitial infiltrates/   No distress, remains with some hypoxia    SUBJECTIVE/OVERNIGHT/INTERVAL HX 01/23/16 - she feels she is not makign progress. RT says pulse ox 100% on high flow mask and might be able to wean down.  VITAL SIGNS: Temp:  [97.5 F (36.4 C)-98.2 F (36.8 C)] 98 F (36.7 C) (02/24 1515) Pulse Rate:  [64-67] 66 (02/24 1643) Resp:  [18-20] 20 (02/24 1643) BP: (121-140)/(33-53) 140/33 mmHg (02/24 1515) SpO2:  [73 %-100 %] 97 % (02/24 1643) FiO2 (%):  [50 %] 50 % (02/24 1225)  PHYSICAL EXAMINATION: General:  Elderly female  No distress. Sitting in chair Neuro:  Awake, alert,oriented, nonfocal HEENT: jvd down Cardiovascular:aFIB, no murmur ,gallop or rub noted Lungs: Bibasilar crackles dry. Sitting in chair.  Abdomen: soft ,non tender, good bowel sounds  Skin:  Intact   Recent Labs Lab 01/19/16 0639 01/22/16 0600 01/23/16 1504  NA 139 143 142  K 4.1 3.6 3.5  CL 106 103 99*  CO2 25 28 30   BUN  15 28* 33*  CREATININE 0.98 1.13* 1.07*  GLUCOSE 117* 179* 239*    Recent Labs Lab 01/22/16 0600 01/23/16 1504  HGB 7.6* 8.1*  HCT 25.6* 28.7*  WBC 11.6* 16.4*  PLT 241 311   Ct Chest Wo Contrast  01/21/2016  CLINICAL DATA:  Influenza, hypoxia EXAM: CT CHEST WITHOUT CONTRAST TECHNIQUE: Multidetector CT imaging of the chest was performed following the standard protocol without IV contrast. COMPARISON:  07/30/2010 FINDINGS: The lungs are well aerated bilaterally. Diffuse emphysematous changes are noted throughout both lungs which have progressed in the interval from the prior exam. Additionally there is diffuse infiltrative density throughout both lungs similar to that seen on recent plain film examination. No focal parenchymal nodules are noted. Small pleural effusions are noted. The thoracic inlet is within normal limits. Heavy calcification of the thoracic aorta is noted. Heavy coronary calcifications are noted. Changes of prior coronary bypass grafting are seen. No significant hilar or mediastinal adenopathy is noted. The visualized upper abdomen is within normal limits. The osseous structures are grossly unremarkable. IMPRESSION: Emphysematous changes with superimposed interstitial infiltrates. No focal confluent infiltrate is noted. Small pleural effusions are seen. Electronically Signed   By: Inez Catalina M.D.   On: 01/21/2016 19:07    ASSESSMENT / PLAN: Acute onChronic respiratory failure r/t COPD exacerbation, bronchitis,inffluenza pneumonitis / ALI Aib rvr on prior amio Mild Overdiuresis   - appears to be slowly improving 01/23/16  PLAN Continue solumedrol - likely needs few  to several weeks of IV steroids Change to high flow heated [place] o2 - informed RT Avoid Amio Continue doxycyline but dc at day 8 Pulmicort to 0.5mg  BID Increase Ipratropium to 4 times daily. Continue albuterol  Continue Atrovent flutter valve Diurese dc Continue guifenesin   PCCM will see atleat x 1  over weekend   ,Dr. Brand Males, M.D., [facility].C.P Pulmonary and Critical Care Medicine Staff Physician [place] [facility] Pager: [phone], If no answer or between  15:00h - 7:00h: call [phone]  01/23/2016 5:11 PM

## 2016-01-23 NOTE — Progress Notes (Signed)
Paged Dr Cruzita Lederer regarding date of pts last BM 01/16/16. Waiting to hear back from dr. Pt voices no complaints at this time. Will monitor.

## 2016-01-23 NOTE — Progress Notes (Signed)
   01/23/16 [id number]  PT Visit Information  Reason Eval/Treat Not Completed Other (comment) (Pt eating will continue efforts.  )

## 2016-01-23 NOTE — Progress Notes (Signed)
Patient arrived to [facility]. Alert and Oriented x 4. Resting comfortably in bed with 100% Nonrebreather. CCMD notified of transfer. Denies any needs at this time.  Milford Cage, RN

## 2016-01-23 NOTE — Progress Notes (Signed)
   01/23/16 0944  PT Visit Information  Reason Eval/Treat Not Completed Medical issues which prohibited therapy (spoke with RN and repiratory O2 sats remain to drop to 70s on ventimask during breakfast will hold therapy today.  Possible transfer to step down per respiratory, RN to page MD.  )

## 2016-01-23 NOTE — Progress Notes (Signed)
UR COMPLETED  

## 2016-01-23 NOTE — Progress Notes (Signed)
Patient alert and oriented sitting in the chair.  Lung sounds clear, decreased bases.  ABG done with patient on 100% NRB O2 sats 98%.  ABG done.  Attempted to wean O2 to 50% [place] O2 sats decreased to 73%.  Placed back on 100% NRB.   ABG results: 7.495/ 38/82/29  BE 5.6  O2 sat 95.  MD notified of results.  Awaiting SDU bed.  RN to call if assistance needed.

## 2016-01-24 ENCOUNTER — Inpatient Hospital Stay (HOSPITAL_COMMUNITY): Payer: Medicare Other

## 2016-01-24 DIAGNOSIS — E1121 Type 2 diabetes mellitus with diabetic nephropathy: Secondary | ICD-10-CM

## 2016-01-24 LAB — BASIC METABOLIC PANEL
ANION GAP: 8 (ref 5–15)
BUN: 33 mg/dL — ABNORMAL HIGH (ref 6–20)
CHLORIDE: 104 mmol/L (ref 101–111)
CO2: 30 mmol/L (ref 22–32)
Calcium: 8.7 mg/dL — ABNORMAL LOW (ref 8.9–10.3)
Creatinine, Ser: 1.06 mg/dL — ABNORMAL HIGH (ref 0.44–1.00)
GFR, EST AFRICAN AMERICAN: 58 mL/min — AB (ref >60)
GFR, EST NON AFRICAN AMERICAN: 50 mL/min — AB (ref >60)
Glucose, Bld: 140 mg/dL — ABNORMAL HIGH (ref 65–99)
POTASSIUM: 3.8 mmol/L (ref 3.5–5.1)
SODIUM: 142 mmol/L (ref 135–145)

## 2016-01-24 LAB — GLUCOSE, CAPILLARY
GLUCOSE-CAPILLARY: 138 mg/dL — AB (ref 65–99)
Glucose-Capillary: 116 mg/dL — ABNORMAL HIGH (ref 65–99)
Glucose-Capillary: 204 mg/dL — ABNORMAL HIGH (ref 65–99)
Glucose-Capillary: 204 mg/dL — ABNORMAL HIGH (ref 65–99)

## 2016-01-24 LAB — CBC
HCT: 25.2 % — ABNORMAL LOW (ref 36.0–46.0)
Hemoglobin: 7.2 g/dL — ABNORMAL LOW (ref 12.0–15.0)
MCH: 22.2 pg — ABNORMAL LOW (ref 26.0–34.0)
MCHC: 28.6 g/dL — AB (ref 30.0–36.0)
MCV: 77.8 fL — ABNORMAL LOW (ref 78.0–100.0)
PLATELETS: 265 10*3/uL (ref 150–400)
RBC: 3.24 MIL/uL — AB (ref 3.87–5.11)
RDW: 17.5 % — AB (ref 11.5–15.5)
WBC: 13 10*3/uL — AB (ref 4.0–10.5)

## 2016-01-24 LAB — SEDIMENTATION RATE: Sed Rate: 50 mm/hr — ABNORMAL HIGH (ref 0–22)

## 2016-01-24 MED ORDER — SODIUM CHLORIDE 0.9 % IV SOLN
Freq: Once | INTRAVENOUS | Status: AC
  Administered 2016-01-25: 02:00:00 via INTRAVENOUS

## 2016-01-24 NOTE — Progress Notes (Signed)
PROGRESS NOTE  [name] [id number] DOB: 06/26/1941 DOA: 12/31/2015 PCP: Nyoka Cowden, MD Outpatient Specialists:    LOS: 9 days   Brief Narrative: 75 year old female with history of paroxysmal A. fib, coronary artery disease, COPD, hypertension, diabetes, admitted on 2/16 with hypoxic respiratory failure found to have influenza, started on Tamiflu as well as antibiotics for community-acquired pneumonia.  Interim summary: Patient completed a course of Tamiflu for influenza, Doxycycline for presumed CAP however her respiratory status remains poor with ongoing needs for NRB, and pulmonology was consulted on 2/22. Patient was transferred to SDU on 2/24 for persistent hypoxia requiring NRB.  Assessment & Plan: Principal Problem:   Generalized weakness Active Problems:   Essential hypertension   PVD, s/p AOBF '04, Lt [facility] BPG 9/05   Diabetes mellitus type 2, controlled (HCC)   PAF (paroxysmal atrial fibrillation) (HCC)   Weakness   Acute on chronic respiratory failure (HCC)   Elevated troponin   Influenza   SOB (shortness of breath)   Acute on chronic hypoxic respiratory failure - Her breathing is about the same today, she is stable, continues to be on nonrebreather, satting in the mid 90s - Appreciate pulmonary input - She completed treatment with Tamiflu, currently is on doxycycline, today is the last day and will discontinue doxycycline after this evening's dose - Likely in the setting of COPD/sedation, bronchitis as well as influenza, and with potential interstitial process seen on the CT scan  Coronary artery disease status post CABG - Currently without chest pain - Mild elevation of her troponins appears to be secondary to demand ischemia - EKG without acute ischemic changes  - 2D echo with normal EF and grade 2 DD  AKI  - likely due to Lasix, closely monitor - Repeat BMP was stable renal function, creatinine 1.06  History of paroxysmal atrial  fibrillation  - Presented on bradycardia and sinus currently. Currently on B blocker, holding Cardizem andamiodarone (last one secondary to transaminitis as well as potential interstitial lung disease).  - Continue monitoring on telemetry and continue apixaban - We'll stop amiodarone on discharge  Chronic anemia - Hemoglobin currently stable, no bleeding - Hemoglobin fluctuates, 7.2 this morning, if it is still close to 7 tomorrow we'll transfuse 1 unit - Obtain repeat anemia panel, she had iron deficiency back in [id number]  Chronic diastolic heart failure - No significant evidence of fluid overload on physical exam or chest x-ray - Status post few doses of Lasix last week  Depression/anxiety - Continue Zoloft   Hyperlipidemia - Holding Lipitor secondary to transaminitis  Transaminitis - Unclear etiology at this moment, may be related to her statin - Amiodarone has been held, however it's unlikely to be out of her system, her LFTs appear to be improving  Diabetes with hyperglycemia -secondary to steroids  -continue SSI  Hypokalemia - Replace and reassess   DVT prophylaxis: Eliquis Code Status: Full Family Communication: no family bedside today, discussed with the daughter bedside 2/24 Disposition Plan: TBD Barriers for discharge: hypoxia  Consultants:   Pulmonary   Procedures:   2D echo: EF 50-55%, grade 2 DD  Antimicrobials:  Tamiflu 2/16 >> 2/21  Doxycycline 2/17 >> with plans for 8 days, today day 8/8  Subjective: - She feels well, she is on a nonrebreather still, somewhat frustrated  Objective: Filed Vitals:   01/24/16 0400 01/24/16 0654 01/24/16 0746 01/24/16 0755  BP: 136/41 138/43 129/47   Pulse: 66  65   Temp: 97.9 F (36.6 C)  97.4  F (36.3 C)   TempSrc: Oral  Axillary   Resp: 19  20   Height:      Weight:      SpO2: 92%  93% 94%    Intake/Output Summary (Last 24 hours) at 01/24/16 1127 Last data filed at 01/24/16 0500  Gross per 24 hour    Intake      0 ml  Output    350 ml  Net   -350 ml   Filed Weights   01/16/16 0406 01/17/16 0346 01/19/16 0511  Weight: 68.13 kg (150 lb 3.2 oz) 64.501 kg (142 lb 3.2 oz) 69 kg (152 lb 1.9 oz)    Examination: BP 129/47 mmHg  Pulse 65  Temp(Src) 97.4 F (36.3 C) (Axillary)  Resp 20  Ht 5\' 2"  (1.575 m)  Wt 69 kg (152 lb 1.9 oz)  BMI 27.82 kg/m2  SpO2 94% General exam: NAD Respiratory system: Clear. Mild increased work of breathing. No wheezing Cardiovascular system: regular rate and rhythm, 3/6 SEM. No JVD. No peripheral edema.  Gastrointestinal system: Abdomen is nondistended, soft and nontender. Normal bowel sounds heard. Central nervous system: AxOx3. No focal deficits Extremities: No clubbing/cyanosis Skin: no rashes  Data Reviewed: I have personally reviewed following labs and imaging studies  CBC:  Recent Labs Lab 01/22/16 0600 01/23/16 1504 01/24/16 0420  WBC 11.6* 16.4* 13.0*  HGB 7.6* 8.1* 7.2*  HCT 25.6* 28.7* 25.2*  MCV 79.3 78.4 77.8*  PLT 241 311 [id number]   Basic Metabolic Panel:  Recent Labs Lab 01/18/16 0544 01/19/16 0639 01/22/16 0600 01/23/16 1504 01/24/16 0420  NA 141 139 143 142 142  K 3.9 4.1 3.6 3.5 3.8  CL 109 106 103 99* 104  CO2 25 25 28 30 30   GLUCOSE 125* 117* 179* 239* 140*  BUN 17 15 28* 33* 33*  CREATININE 0.95 0.98 1.13* 1.07* 1.06*  CALCIUM 8.7* 8.8* 8.5* 9.2 8.7*   GFR: Estimated Creatinine Clearance: 42.4 mL/min (by C-G formula based on Cr of 1.06). Liver Function Tests:  Recent Labs Lab 01/18/16 0544 01/19/16 0639 01/22/16 0600  AST 179* 102* 57*  ALT 299* 231* 110*  ALKPHOS 70 78 70  BILITOT 0.8 1.2 0.8  PROT 5.2* 5.8* 5.7*  ALBUMIN 2.3* 2.4* 2.2*   BNP (last 3 results)  Recent Labs  05/21/15 0835  PROBNP 140.0*   HbA1C: No results for input(s): HGBA1C in the last 72 hours. CBG:  Recent Labs Lab 01/23/16 0757 01/23/16 1153 01/23/16 1802 01/23/16 2110 01/24/16 0745  GLUCAP 118* 183* 193* 97 116*    Anemia Panel: No results for input(s): VITAMINB12, FOLATE, FERRITIN, TIBC, IRON, RETICCTPCT in the last 72 hours. Urine analysis:    Component Value Date/Time   COLORURINE YELLOW 01/15/2016 1517   APPEARANCEUR CLOUDY* 01/15/2016 1517   LABSPEC 1.015 01/15/2016 1517   PHURINE 6.0 01/15/2016 1517   GLUCOSEU NEGATIVE 01/15/2016 1517   GLUCOSEU NEGATIVE 02/18/2015 0913   HGBUR NEGATIVE 01/15/2016 1517   HGBUR negative 09/01/2009 [place] 01/15/2016 1517   BILIRUBINUR n 07/18/2013 [place] 01/15/2016 1517   PROTEINUR 30* 01/15/2016 1517   PROTEINUR n 07/18/2013 1604   UROBILINOGEN 0.2 02/18/2015 0913   UROBILINOGEN 1.0 07/18/2013 1604   NITRITE POSITIVE* 01/15/2016 1517   NITRITE positive 07/18/2013 1604   LEUKOCYTESUR NEGATIVE 01/15/2016 1517   Sepsis Labs: Invalid input(s): PROCALCITONIN, LACTICIDVEN  Recent Results (from the past 240 hour(s))  MRSA PCR Screening     Status: None  Collection Time: 01/23/16  6:50 PM  Result Value Ref Range Status   MRSA by PCR NEGATIVE NEGATIVE Final    Comment:        The GeneXpert MRSA Assay (FDA approved for NASAL specimens only), is one component of a comprehensive MRSA colonization surveillance program. It is not intended to diagnose MRSA infection nor to guide or monitor treatment for MRSA infections.       Radiology Studies: No results found. Scheduled Meds: . antiseptic oral rinse  7 mL Mouth Rinse BID  . apixaban  5 mg Oral BID  . aspirin EC  81 mg Oral Daily  . budesonide (PULMICORT) nebulizer solution  0.5 mg Nebulization BID  . doxycycline  100 mg Oral Q12H  . gabapentin  600 mg Oral TID  . hydrALAZINE  10 mg Oral 3 times per day  . insulin aspart  0-9 Units Subcutaneous TID WC  . ipratropium-albuterol  3 mL Nebulization TID  . LORazepam  0.5 mg Oral BID  . methylPREDNISolone (SOLU-MEDROL) injection  60 mg Intravenous Q12H  . metoprolol tartrate  12.5 mg Oral BID  .  polyethylene glycol  17 g Oral BID  . potassium chloride  10 mEq Oral Daily  . sertraline  25 mg Oral Daily   Continuous Infusions:   Marzetta Board, MD, PhD [facility] Pager [phone]  If 7PM-7AM, please contact night-coverage [email or web address] Password [facility] 01/24/2016, 11:27 AM

## 2016-01-24 NOTE — Progress Notes (Signed)
   Name: ELIJAH MICHAELIS MRN: [id number] DOB: [birth year]    ADMISSION DATE:  01/27/2016 CONSULTATION DATE:  01/21/16  REFERRING MD :  Wardell Heath  CHIEF COMPLAINT:  SOB  BRIEF PATIENT DESCRIPTION:   75 y.o. female with history of paroxysmal atrial fibrillation on amiodarone chronically , CAD status post CABG, COPD, hypertension and diabetes mellitus type 2 and fell on 2/15 and was brought to the ER  On 2/16  For the patient's daughter found the patient was weak and had some slurred speech. Patient uses 2 liters of oxygen at home. Patient is currently diagnosed with influenza on  Tamiflu and Doxycycline. On 2/22/ while working with the PT her O2 dropped down to 79% from 87-89% then maintained in between 82-85%.  Therefore PCCM team was consulted on 01/21/16  SIGNIFICANT EVENTS and STUDIES  ED  2/16 with increased weakness and slurred speech CT head >2/16 was neg Influenza A >2/16 positive CT chest 2/23>>>Emphysematous changes with superimposed interstitial infiltrates/   No distress, remains with some hypoxia      SUBJECTIVE/OVERNIGHT/INTERVAL HX No specific complaints, denies cough   VITAL SIGNS: Temp:  [97.4 F (36.3 C)-98.7 F (37.1 C)] 97.4 F (36.3 C) (02/25 0746) Pulse Rate:  [62-70] 65 (02/25 0746) Resp:  [14-20] 20 (02/25 0746) BP: (100-140)/(33-70) 129/47 mmHg (02/25 0746) SpO2:  [73 %-98 %] 94 % (02/25 0755) FiO2 (%):  [50 %-100 %] 100 % (02/25 0755)  PHYSICAL EXAMINATION: General:  Elderly female  No distress. Sitting in on side of bed  Neuro:  Awake, alert,oriented, nonfocal HEENT: jvd down Cardiovascular:aFIB, no murmur ,gallop or rub noted Lungs: very minimal insp crackles, distant bs/ no wheeze  Abdomen: soft ,non tender, good bowel sounds  Skin:  Intact   Recent Labs Lab 01/22/16 0600 01/23/16 1504 01/24/16 0420  NA 143 142 142  K 3.6 3.5 3.8  CL 103 99* 104  CO2 '28 30 30  '$ BUN 28* 33* 33*  CREATININE 1.13* 1.07* 1.06*  GLUCOSE 179* 239* 140*     Recent Labs Lab 01/22/16 0600 01/23/16 1504 01/24/16 0420  HGB 7.6* 8.1* 7.2*  HCT 25.6* 28.7* 25.2*  WBC 11.6* 16.4* 13.0*  PLT 241 311 265   No results found.  ASSESSMENT / PLAN: Acute onChronic respiratory failure r/t COPD exacerbation, bronchitis,inffluenza pneumonitis / ALI Aib rvr  amio prior to admit Mild Overdiuresis   - appears to be slowly improving 01/23/16  PLAN Continue solumedrol - likely needs few to several weeks of IV steroids Change to high flow humidified  [place] 02  With goal of sats > 88% Avoid Amio permanently in pt chronically 02 dep at baseline  Continue doxycyline but dc at day 8 Pulmicort to 0.'5mg'$  BID Continue  Ipratropium to 4 times daily. Continue albuterol  Flutter valve Continue guifenesin Check esr to help with steroid dosing      Christinia Gully, MD Pulmonary and [place] [phone] After 5:30 PM or weekends, call [phone]

## 2016-01-24 NOTE — Progress Notes (Signed)
Pt is on BiPAP at this time for desaturation episodes.  Pt is tolerating it well. No complications noted.  RN aware

## 2016-01-25 LAB — CBC
HCT: 33.8 % — ABNORMAL LOW (ref 36.0–46.0)
HEMATOCRIT: 25.9 % — AB (ref 36.0–46.0)
HEMOGLOBIN: 7.5 g/dL — AB (ref 12.0–15.0)
Hemoglobin: 10.5 g/dL — ABNORMAL LOW (ref 12.0–15.0)
MCH: 22.8 pg — ABNORMAL LOW (ref 26.0–34.0)
MCH: 24.2 pg — ABNORMAL LOW (ref 26.0–34.0)
MCHC: 31.1 g/dL (ref 30.0–36.0)
MCHC: 29 g/dL — AB (ref 30.0–36.0)
MCV: 77.9 fL — ABNORMAL LOW (ref 78.0–100.0)
MCV: 78.7 fL (ref 78.0–100.0)
PLATELETS: 273 10*3/uL (ref 150–400)
Platelets: 298 10*3/uL (ref 150–400)
RBC: 3.29 MIL/uL — ABNORMAL LOW (ref 3.87–5.11)
RBC: 4.34 MIL/uL (ref 3.87–5.11)
RDW: 16.5 % — AB (ref 11.5–15.5)
RDW: 17.8 % — AB (ref 11.5–15.5)
WBC: 19.2 10*3/uL — ABNORMAL HIGH (ref 4.0–10.5)
WBC: 15.7 10*3/uL — AB (ref 4.0–10.5)

## 2016-01-25 LAB — COMPREHENSIVE METABOLIC PANEL
ALBUMIN: 2.2 g/dL — AB (ref 3.5–5.0)
ALK PHOS: 90 U/L (ref 38–126)
ALT: 87 U/L — ABNORMAL HIGH (ref 14–54)
ANION GAP: 13 (ref 5–15)
AST: 79 U/L — ABNORMAL HIGH (ref 15–41)
BUN: 35 mg/dL — ABNORMAL HIGH (ref 6–20)
CALCIUM: 8.9 mg/dL (ref 8.9–10.3)
CHLORIDE: 99 mmol/L — AB (ref 101–111)
CO2: 26 mmol/L (ref 22–32)
Creatinine, Ser: 1.02 mg/dL — ABNORMAL HIGH (ref 0.44–1.00)
GFR calc non Af Amer: 53 mL/min — ABNORMAL LOW (ref >60)
GLUCOSE: 124 mg/dL — AB (ref 65–99)
POTASSIUM: 5 mmol/L (ref 3.5–5.1)
SODIUM: 138 mmol/L (ref 135–145)
Total Bilirubin: 1.5 mg/dL — ABNORMAL HIGH (ref 0.3–1.2)
Total Protein: 5.2 g/dL — ABNORMAL LOW (ref 6.5–8.1)

## 2016-01-25 LAB — RETICULOCYTES
RBC.: 3.29 MIL/uL — ABNORMAL LOW (ref 3.87–5.11)
RETIC COUNT ABSOLUTE: 128.3 10*3/uL (ref 19.0–186.0)
Retic Ct Pct: 3.9 % — ABNORMAL HIGH (ref 0.4–3.1)

## 2016-01-25 LAB — FERRITIN: FERRITIN: 369 ng/mL — AB (ref 11–307)

## 2016-01-25 LAB — GLUCOSE, CAPILLARY
GLUCOSE-CAPILLARY: 158 mg/dL — AB (ref 65–99)
Glucose-Capillary: 166 mg/dL — ABNORMAL HIGH (ref 65–99)
Glucose-Capillary: 148 mg/dL — ABNORMAL HIGH (ref 65–99)
Glucose-Capillary: 140 mg/dL — ABNORMAL HIGH (ref 65–99)

## 2016-01-25 LAB — IRON AND TIBC
IRON: 33 ug/dL (ref 28–170)
SATURATION RATIOS: 11 % (ref 10.4–31.8)
TIBC: 298 ug/dL (ref 250–450)
UIBC: 265 ug/dL

## 2016-01-25 LAB — VITAMIN B12: Vitamin B-12: 1048 pg/mL — ABNORMAL HIGH (ref 180–914)

## 2016-01-25 LAB — PREPARE RBC (CROSSMATCH)

## 2016-01-25 LAB — FOLATE: FOLATE: 30.5 ng/mL (ref >5.9)

## 2016-01-25 MED ORDER — PANTOPRAZOLE SODIUM 40 MG PO TBEC
40.0000 mg | DELAYED_RELEASE_TABLET | Freq: Two times a day (BID) | ORAL | Status: DC
  Administered 2016-01-25 – 2016-01-26 (×2): 40 mg via ORAL
  Filled 2016-01-25 (×2): qty 1

## 2016-01-25 MED ORDER — FUROSEMIDE 10 MG/ML IJ SOLN
40.0000 mg | Freq: Once | INTRAMUSCULAR | Status: AC
  Administered 2016-01-25: 40 mg via INTRAVENOUS
  Filled 2016-01-25: qty 4

## 2016-01-25 MED ORDER — METHYLPREDNISOLONE SODIUM SUCC 125 MG IJ SOLR
80.0000 mg | Freq: Two times a day (BID) | INTRAMUSCULAR | Status: DC
  Administered 2016-01-25 – 2016-01-26 (×2): 80 mg via INTRAVENOUS
  Filled 2016-01-25 (×2): qty 2

## 2016-01-25 NOTE — Progress Notes (Signed)
Dr Shellee Milo,  PCCM rounding spoke to this nurse regarding Pt saturation goals 86%-88% due to her" amiodarone toxicity". Stated saturation 100% not suitable for patient. Requested pt be off Bipap. Respiratory Therapist made aware. Will continue to monitor respiratory status and saturations.Marland Kitchen

## 2016-01-25 NOTE — Progress Notes (Signed)
Pt was placed on HFNC at 15LPM. SATs remained in the low 80's while on this according to RN. Pt was 82% when I checked on her on the HFNC, placed back to NRB to maintain SAT goal 86-88.

## 2016-01-25 NOTE — Progress Notes (Signed)
Pt SATS remain 82% for several minutes with breathing treatment and 100% NRB. Pt sitting up in the bed mouth breathing. RT placing pt on BIPAP to see if this improves SATS. Order for Bipap is already existing. RN aware.

## 2016-01-25 NOTE — Progress Notes (Signed)
Called RT after speaking with Dr Cruzita Lederer regarding following Pulmonologist recommendation as of 01/24/2016 to change to High Flow humidified [place]. Respiratory Therapist requesting order be entered. MD paged and made aware. Awaiting order.

## 2016-01-25 NOTE — Progress Notes (Signed)
Patient ID: [name], female   DOB: Apr 01, 1941, 75 y.o.   MRN: [id number]  Rn reports pt has had increased shortness of breath and decreased 02 sats. Pt on 10 liters on non rebreather mask. . Pt had lasix stopped due to renal failure.  Respiratory therapy ordered for bipap.  PE. Uncomfortable 74y/o  Lungs decreased breath sounds, rhonchi and rales   Chest xray shows vascular congestion/pulmonary edema.  Pt given lasix 40 Iv.  Pt improving with lasix and bipap.

## 2016-01-25 NOTE — Progress Notes (Signed)
Called also Dr Melvyn Novas for order to change to High Flow Humidified with stated goa,l requested per RT. Reluctant to do so. Stated "I do not care how it is done" ; that to have RT " can enter the order if wants to" or to call him directly if not understood.

## 2016-01-25 NOTE — Progress Notes (Signed)
Name: [name] MRN: [id number] DOB: 1941-05-24    ADMISSION DATE:  01/13/2016 CONSULTATION DATE:  01/21/16  REFERRING MD :  Wardell Heath  CHIEF COMPLAINT:  SOB  BRIEF PATIENT DESCRIPTION:   75 y.o. female with history of paroxysmal atrial fibrillation on amiodarone chronically , CAD status post CABG, COPD, hypertension and diabetes mellitus type 2 and fell on 2/15 and was brought to the ER  On 2/16  For the patient's daughter found the patient was weak and had some slurred speech. Patient uses 2 liters of oxygen at home. Patient is currently diagnosed with influenza on  Tamiflu and Doxycycline. On 2/22/ while working with the PT her O2 dropped down to 79% from 87-89% then maintained in between 82-85%.  Therefore PCCM team was consulted on 01/21/16  SIGNIFICANT EVENTS and STUDIES  ED  2/16 with increased weakness and slurred speech CT head >2/16 was neg Influenza A >2/16 positive CT chest 2/23>>>Emphysematous changes with superimposed interstitial infiltrates/   No distress, remains with some hypoxia - ESR 01/23/26 = 50     SUBJECTIVE/OVERNIGHT/INTERVAL HX Still dep on bibpap at hs and high fi02 daytime   VITAL SIGNS: Temp:  [97 F (36.1 C)-98.6 F (37 C)] 97.2 F (36.2 C) (02/26 0918) Pulse Rate:  [56-84] 76 (02/26 0918) Resp:  [16-26] 17 (02/26 0918) BP: (114-156)/(31-107) 137/40 mmHg (02/26 0918) SpO2:  [87 %-98 %] 89 % (02/26 0918) FiO2 (%):  [100 %] 100 % (02/26 0800)  PHYSICAL EXAMINATION: General:  Elderly female  No distress. Lying in bed on bipap  Neuro:  Awake, alert,oriented, nonfocal HEENT: jvd down Cardiovascular:aFIB, no murmur ,gallop or rub noted Lungs: very minimal insp crackles, distant bs/ no wheeze  Abdomen: soft ,non tender, good bowel sounds  Skin:  Intact   Recent Labs Lab 01/23/16 1504 01/24/16 0420 01/25/16 0035  NA 142 142 138  K 3.5 3.8 5.0  CL 99* 104 99*  CO2 '30 30 26  '$ BUN 33* 33* 35*  CREATININE 1.07* 1.06* 1.02*  GLUCOSE 239*  140* 124*    Recent Labs Lab 01/23/16 1504 01/24/16 0420 01/25/16 0035  HGB 8.1* 7.2* 7.5*  HCT 28.7* 25.2* 25.9*  WBC 16.4* 13.0* 19.2*  PLT 311 265 298   Dg Chest Portable 1 View  01/25/2016  CLINICAL DATA:  Acute onset of shortness of breath. Initial encounter. EXAM: PORTABLE CHEST 1 VIEW COMPARISON:  Chest radiograph performed 01/19/2016, and CT of the chest performed 01/21/2016 FINDINGS: The lungs are well-aerated. Vascular congestion is noted. Increased interstitial markings raise concern for pulmonary edema. There is no evidence of pleural effusion or pneumothorax. The cardiomediastinal silhouette is normal in size. The patient is status post median sternotomy. No acute osseous abnormalities are seen. IMPRESSION: Vascular congestion noted. Increased interstitial markings raise concern for pulmonary edema. Electronically Signed   By: Garald Balding M.D.   On: 01/25/2016 00:09    ASSESSMENT / PLAN: Acute on Chronic respiratory failure r/t COPD exacerbation, bronchitis,inffluenza pneumonitis / ALI Aib rvr  amio prior to admit Mild Overdiuresis   - appears to be slowly improving 01/23/16  PLAN Continue solumedrol - likely needs  several weeks of IV steroids Change to high flow humidified  [place] 02  With goal of sats > 88% Avoid Amio permanently in pt chronically 02 dep at baseline  Continue doxycyline but dc at day 8 = 2//27 Pulmicort to 0.'5mg'$  BID Continue  Ipratropium to 4 times daily. Continue albuterol  Flutter valve Continue guifenesin  Rec  No great options here, ESR 50 so hopefully there is enough of an inflammatory component to respond to steroids  Reviewed need to keep fio2 low as likely there is component of amio toxicity in play though hard to tease out from ALI related to flu       Christinia Gully, MD Pulmonary and [place] [phone] After 5:30 PM or weekends, call [phone]

## 2016-01-25 NOTE — Progress Notes (Signed)
Per MD request, took pt off BIPAP and placed on NRB partial and breathing treatment was given. PT SATS dropped in low 70's initially. SATs increased to 80's with rebreather.

## 2016-01-25 NOTE — Progress Notes (Signed)
PROGRESS NOTE  [name] [id number] DOB: December 15, 1940 DOA: 01/17/2016 PCP: Nyoka Cowden, MD Outpatient Specialists:    LOS: 10 days   Brief Narrative: 75 year old female with history of paroxysmal A. fib, coronary artery disease, COPD, hypertension, diabetes, admitted on 2/16 with hypoxic respiratory failure found to have influenza, started on Tamiflu as well as antibiotics for community-acquired pneumonia.  Interim summary: Patient completed a course of Tamiflu for influenza, Doxycycline for presumed CAP however her respiratory status remains poor with ongoing needs for NRB, and pulmonology was consulted on 2/22. Patient was transferred to SDU on 2/24 for persistent hypoxia requiring NRB  Assessment & Plan: Principal Problem:   Generalized weakness Active Problems:   Essential hypertension   PVD, s/p AOBF '04, Lt [facility] BPG 9/05   Diabetes mellitus type 2, controlled ([place])   PAF (paroxysmal atrial fibrillation) (HCC)   Weakness   Acute on chronic respiratory failure (HCC)   Elevated troponin   Influenza   SOB (shortness of breath)   Acute on chronic hypoxic respiratory failure - Her breathing is about the same today, she is stable, continues to be on nonrebreather and even required BiPAP last night, satting in the mid 90s with occasional desaturation episodes - Appreciate pulmonary input, management per PCCM - She completed treatment with Tamiflu, and completed a course of currently is on doxycycline - Likely in the setting of COPD/sedation, bronchitis as well as influenza, and with potential interstitial process seen on the CT scan possibly due to Amidoarone  Coronary artery disease status post CABG - Currently without chest pain - Mild elevation of her troponins appears to be secondary to demand ischemia - EKG without acute ischemic changes  - 2D echo with normal EF and grade 2 DD  AKI  - likely due to Lasix, closely monitor - Repeat BMP was stable renal  function, creatinine 1.02 this morning  History of paroxysmal atrial fibrillation  - Presented on bradycardia and sinus currently. Currently on B blocker, holding Cardizem andamiodarone (last one secondary to transaminitis as well as potential interstitial lung disease).  - Continue monitoring on telemetry and continue apixaban - We'll stop amiodarone on discharge  Chronic anemia - Hemoglobin currently stable, no bleeding, but remains in the 7 range. Transfused 2U pRBC overnight with Lasix  - repeat anemia panel without overt deficiencies but iron is a little low  Chronic diastolic heart failure - No significant evidence of fluid overload on physical exam or chest x-ray - Status post few doses of Lasix last week  Depression/anxiety - Continue Zoloft   Hyperlipidemia - Holding Lipitor secondary to transaminitis  Transaminitis - Unclear etiology at this moment, may be related to her statin - Amiodarone has been held, however it's unlikely to be out of her system, her LFTs appear to be improving  Diabetes with hyperglycemia -secondary to steroids  -continue SSI  Hypokalemia - Replace and reassess   DVT prophylaxis: Eliquis Code Status: Full Family Communication: no family bedside today, discussed with the daughter bedside 2/24 Disposition Plan: TBD Barriers for discharge: hypoxia  Consultants:   Pulmonary   Procedures:   2D echo: EF 50-55%, grade 2 DD  Antimicrobials:  Tamiflu 2/16 >> 2/21  Doxycycline 2/17 >> 2/25  Subjective: - coughing up more today, starting to bring up sputum  Objective: Filed Vitals:   01/25/16 0800 01/25/16 0918 01/25/16 1142 01/25/16 1147  BP: 156/47 137/40  127/102  Pulse: 56 76  72  Temp: 98.6 F (37 C) 97.2 F (36.2 C)  98.8 F (37.1 C)  TempSrc: Axillary Axillary  Oral  Resp: 17 17  15   Height:      Weight:      SpO2: 96% 89% 90% 87%    Intake/Output Summary (Last 24 hours) at 01/25/16 1219 Last data filed at  01/25/16 0931  Gross per 24 hour  Intake   2366 ml  Output    175 ml  Net   2191 ml   Filed Weights   01/16/16 0406 01/17/16 0346 01/19/16 0511  Weight: 68.13 kg (150 lb 3.2 oz) 64.501 kg (142 lb 3.2 oz) 69 kg (152 lb 1.9 oz)    Examination: BP 127/102 mmHg  Pulse 72  Temp(Src) 98.8 F (37.1 C) (Oral)  Resp 15  Ht 5\' 2"  (1.575 m)  Wt 69 kg (152 lb 1.9 oz)  BMI 27.82 kg/m2  SpO2 87% General exam: NAD Respiratory system: Clear. Mild increased work of breathing. No wheezing Cardiovascular system: regular rate and rhythm, 3/6 SEM. No JVD. No peripheral edema.  Gastrointestinal system: Abdomen is nondistended, soft and nontender. Normal bowel sounds heard. Central nervous system: AxOx3. No focal deficits Extremities: No clubbing/cyanosis Skin: no rashes  Data Reviewed: I have personally reviewed following labs and imaging studies  CBC:  Recent Labs Lab 01/22/16 0600 01/23/16 1504 01/24/16 0420 01/25/16 0035  WBC 11.6* 16.4* 13.0* 19.2*  HGB 7.6* 8.1* 7.2* 7.5*  HCT 25.6* 28.7* 25.2* 25.9*  MCV 79.3 78.4 77.8* 78.7  PLT 241 311 265 [id number]   Basic Metabolic Panel:  Recent Labs Lab 01/19/16 0639 01/22/16 0600 01/23/16 1504 01/24/16 0420 01/25/16 0035  NA 139 143 142 142 138  K 4.1 3.6 3.5 3.8 5.0  CL 106 103 99* 104 99*  CO2 25 28 30 30 26   GLUCOSE 117* 179* 239* 140* 124*  BUN 15 28* 33* 33* 35*  CREATININE 0.98 1.13* 1.07* 1.06* 1.02*  CALCIUM 8.8* 8.5* 9.2 8.7* 8.9   GFR: Estimated Creatinine Clearance: 44.1 mL/min (by C-G formula based on Cr of 1.02). Liver Function Tests:  Recent Labs Lab 01/19/16 0639 01/22/16 0600 01/25/16 0035  AST 102* 57* 79*  ALT 231* 110* 87*  ALKPHOS 78 70 90  BILITOT 1.2 0.8 1.5*  PROT 5.8* 5.7* 5.2*  ALBUMIN 2.4* 2.2* 2.2*   BNP (last 3 results)  Recent Labs  05/21/15 0835  PROBNP 140.0*   HbA1C: No results for input(s): HGBA1C in the last 72 hours. CBG:  Recent Labs Lab 01/24/16 0745 01/24/16 1228  01/24/16 1734 01/24/16 2054 01/25/16 0736  GLUCAP 116* 204* 204* 138* 158*   Anemia Panel:  Recent Labs  01/25/16 0035  VITAMINB12 1048*  FOLATE 30.5  FERRITIN 369*  TIBC 298  IRON 33  RETICCTPCT 3.9*   Urine analysis:    Component Value Date/Time   COLORURINE YELLOW 01/15/2016 1517   APPEARANCEUR CLOUDY* 01/15/2016 1517   LABSPEC 1.015 01/15/2016 1517   PHURINE 6.0 01/15/2016 1517   GLUCOSEU NEGATIVE 01/15/2016 1517   GLUCOSEU NEGATIVE 02/18/2015 0913   HGBUR NEGATIVE 01/15/2016 1517   HGBUR negative 09/01/2009 [place] 01/15/2016 1517   BILIRUBINUR n 07/18/2013 [place] 01/15/2016 1517   PROTEINUR 30* 01/15/2016 1517   PROTEINUR n 07/18/2013 1604   UROBILINOGEN 0.2 02/18/2015 0913   UROBILINOGEN 1.0 07/18/2013 1604   NITRITE POSITIVE* 01/15/2016 1517   NITRITE positive 07/18/2013 1604   LEUKOCYTESUR NEGATIVE 01/15/2016 1517   Sepsis Labs: Invalid input(s): PROCALCITONIN, LACTICIDVEN  Recent Results (from  the past 240 hour(s))  MRSA PCR Screening     Status: None   Collection Time: 01/23/16  6:50 PM  Result Value Ref Range Status   MRSA by PCR NEGATIVE NEGATIVE Final    Comment:        The GeneXpert MRSA Assay (FDA approved for NASAL specimens only), is one component of a comprehensive MRSA colonization surveillance program. It is not intended to diagnose MRSA infection nor to guide or monitor treatment for MRSA infections.       Radiology Studies: Dg Chest Portable 1 View  01/25/2016  CLINICAL DATA:  Acute onset of shortness of breath. Initial encounter. EXAM: PORTABLE CHEST 1 VIEW COMPARISON:  Chest radiograph performed 01/19/2016, and CT of the chest performed 01/21/2016 FINDINGS: The lungs are well-aerated. Vascular congestion is noted. Increased interstitial markings raise concern for pulmonary edema. There is no evidence of pleural effusion or pneumothorax. The cardiomediastinal silhouette is normal in size.  The patient is status post median sternotomy. No acute osseous abnormalities are seen. IMPRESSION: Vascular congestion noted. Increased interstitial markings raise concern for pulmonary edema. Electronically Signed   By: Garald Balding M.D.   On: 01/25/2016 00:09   Scheduled Meds: . antiseptic oral rinse  7 mL Mouth Rinse BID  . apixaban  5 mg Oral BID  . aspirin EC  81 mg Oral Daily  . budesonide (PULMICORT) nebulizer solution  0.5 mg Nebulization BID  . gabapentin  600 mg Oral TID  . hydrALAZINE  10 mg Oral 3 times per day  . insulin aspart  0-9 Units Subcutaneous TID WC  . ipratropium-albuterol  3 mL Nebulization TID  . LORazepam  0.5 mg Oral BID  . methylPREDNISolone (SOLU-MEDROL) injection  80 mg Intravenous Q12H  . metoprolol tartrate  12.5 mg Oral BID  . pantoprazole  40 mg Oral BID AC  . polyethylene glycol  17 g Oral BID  . potassium chloride  10 mEq Oral Daily  . sertraline  25 mg Oral Daily   Continuous Infusions:   Marzetta Board, MD, PhD [facility] Pager [phone]  If 7PM-7AM, please contact night-coverage [email or web address] Password [facility] 01/25/2016, 12:19 PM

## 2016-01-25 NOTE — Progress Notes (Signed)
Pt is currently on BiPAP on my arrival. Pt Sats maintains at 87-88%. Pt is stable at this time no complications noted. RT will continue to monitor pt.

## 2016-01-25 NOTE — Progress Notes (Signed)
NIV settings changed to meet Sat goal. Pt is sleeping mouth breathing with grunting respirations. Late inspiratory Fine crackles noted in the bases. RT will monitor. RN aware of desaturation.

## 2016-01-25 NOTE — Progress Notes (Signed)
Came to assess pt, pt is tolerating BiPAP well. O2 sats are now acceptable. Pt is sleeping comfortably no distress noted. RT will continue to monitor

## 2016-01-26 ENCOUNTER — Inpatient Hospital Stay (HOSPITAL_COMMUNITY): Payer: Medicare Other

## 2016-01-26 LAB — TYPE AND SCREEN
ABO/RH(D): O POS
Antibody Screen: NEGATIVE
Unit division: 0
Unit division: 0

## 2016-01-26 LAB — GLUCOSE, CAPILLARY
GLUCOSE-CAPILLARY: 165 mg/dL — AB (ref 65–99)
Glucose-Capillary: 161 mg/dL — ABNORMAL HIGH (ref 65–99)
Glucose-Capillary: 112 mg/dL — ABNORMAL HIGH (ref 65–99)
Glucose-Capillary: 152 mg/dL — ABNORMAL HIGH (ref 65–99)

## 2016-01-26 MED ORDER — METHYLPREDNISOLONE SODIUM SUCC 125 MG IJ SOLR
125.0000 mg | Freq: Two times a day (BID) | INTRAMUSCULAR | Status: DC
  Administered 2016-01-26 – 2016-01-27 (×2): 125 mg via INTRAVENOUS
  Filled 2016-01-26 (×2): qty 2

## 2016-01-26 MED ORDER — IPRATROPIUM-ALBUTEROL 0.5-2.5 (3) MG/3ML IN SOLN: 3.0000 mL | Freq: Four times a day (QID) | RESPIRATORY_TRACT | Status: DC | PRN

## 2016-01-26 MED ORDER — HYDRALAZINE HCL 20 MG/ML IJ SOLN
10.0000 mg | INTRAMUSCULAR | Status: DC | PRN
  Administered 2016-01-26 (×2): 10 mg via INTRAVENOUS
  Filled 2016-01-26 (×2): qty 1

## 2016-01-26 MED ORDER — FUROSEMIDE 10 MG/ML IJ SOLN
40.0000 mg | Freq: Once | INTRAMUSCULAR | Status: AC
  Administered 2016-01-26: 40 mg via INTRAVENOUS
  Filled 2016-01-26: qty 4

## 2016-01-26 MED ORDER — HYDRALAZINE HCL 20 MG/ML IJ SOLN
5.0000 mg | INTRAMUSCULAR | Status: DC | PRN
  Administered 2016-01-26: 5 mg via INTRAVENOUS
  Filled 2016-01-26: qty 1

## 2016-01-26 MED ORDER — LORAZEPAM 2 MG/ML IJ SOLN
0.5000 mg | Freq: Four times a day (QID) | INTRAMUSCULAR | Status: DC | PRN
  Administered 2016-01-26 (×2): 0.5 mg via INTRAVENOUS
  Administered 2016-01-26 – 2016-01-27 (×2): 1 mg via INTRAVENOUS
  Filled 2016-01-26 (×4): qty 1

## 2016-01-26 MED ORDER — MORPHINE SULFATE (PF) 2 MG/ML IV SOLN
0.5000 mg | INTRAVENOUS | Status: DC | PRN
  Administered 2016-01-26 – 2016-01-27 (×3): 0.5 mg via INTRAVENOUS
  Filled 2016-01-26 (×3): qty 1

## 2016-01-26 NOTE — Progress Notes (Signed)
Gherge MD notified that patient is unable to tolerate coming off BIPAP for medication administration because the oxygen saturation drops to the 50s per the night shift RN. This RN educated patient that it is unsafe at this time to remove the bipap for sips of water, etc. Will continue to monitor the patient closely and await further orders.

## 2016-01-26 NOTE — Progress Notes (Signed)
Family at bedside at this time. Letting family spend time with her. Pt is stable at this time, pt Currently on the BiPAP. RT will monitor.

## 2016-01-26 NOTE — Progress Notes (Signed)
PT Cancellation Note  Patient Details Name: [name] MRN: [id number] DOB: [birth year]   Cancelled Treatment:    Reason Eval/Treat Not Completed: Medical issues which prohibited therapy. Pt SaO2 79-88% on BiPap. Nursing stated to hold today. Will follow up as appropriate.   Colon Branch, SPT Colon Branch 01/26/2016, 9:49 AM

## 2016-01-26 NOTE — Progress Notes (Signed)
Utilization review complete. Ruby Dilone RN CCM Case Mgmt phone [phone] 

## 2016-01-26 NOTE — Progress Notes (Signed)
Name: [name] MRN: [id number] DOB: 02-Aug-1941    ADMISSION DATE:  01/01/2016 CONSULTATION DATE:  01/21/16  REFERRING MD :  Wardell Heath  CHIEF COMPLAINT:  SOB  BRIEF PATIENT DESCRIPTION:   75 y.o. female with history of paroxysmal atrial fibrillation on amiodarone chronically , CAD status post CABG, COPD, hypertension and diabetes mellitus type 2 and fell on 2/15 and was brought to the ER  On 2/16  For the patient's daughter found the patient was weak and had some slurred speech. Patient uses 2 liters of oxygen at home. Patient is currently diagnosed with influenza on  Tamiflu and Doxycycline. On 2/22/ while working with the PT her O2 dropped down to 79% from 87-89% then maintained in between 82-85%.  Therefore PCCM team was consulted on 01/21/16  SIGNIFICANT EVENTS and STUDIES  ED  2/16 with increased weakness and slurred speech CT head >2/16 was neg Influenza A >2/16 positive CT chest 2/23>>>Emphysematous changes with superimposed interstitial infiltrates/   No distress, remains with some hypoxia - ESR 01/23/26 = 50     SUBJECTIVE/OVERNIGHT/INTERVAL HX bipap and now desat on 100%  VITAL SIGNS: Temp:  [97.8 F (36.6 C)-99.1 F (37.3 C)] 98.7 F (37.1 C) (02/27 0800) Pulse Rate:  [62-79] 73 (02/27 0800) Resp:  [15-23] 23 (02/27 0800) BP: (118-177)/(47-102) 177/59 mmHg (02/27 0800) SpO2:  [50 %-91 %] 86 % (02/27 0800) FiO2 (%):  [80 %-100 %] 80 % (02/27 0800)  PHYSICAL EXAMINATION: General:  Elderly female  No distress. Lying in bed on bipap  Neuro:  Awake, alert,oriented, nonfocal HEENT: jvd wnl Cardiovascular:aFIB, no murmur ,gallop or rub noted Lungs: coarse crackles Abdomen: soft ,non tender, good bowel sounds  Skin:  Intact   Recent Labs Lab 01/23/16 1504 01/24/16 0420 01/25/16 0035  NA 142 142 138  K 3.5 3.8 5.0  CL 99* 104 99*  CO2 '30 30 26  '$ BUN 33* 33* 35*  CREATININE 1.07* 1.06* 1.02*  GLUCOSE 239* 140* 124*    Recent Labs Lab 01/24/16 0420  01/25/16 0035 01/25/16 1122  HGB 7.2* 7.5* 10.5*  HCT 25.2* 25.9* 33.8*  WBC 13.0* 19.2* 15.7*  PLT 265 298 273   Dg Chest Portable 1 View  01/25/2016  CLINICAL DATA:  Acute onset of shortness of breath. Initial encounter. EXAM: PORTABLE CHEST 1 VIEW COMPARISON:  Chest radiograph performed 01/19/2016, and CT of the chest performed 01/21/2016 FINDINGS: The lungs are well-aerated. Vascular congestion is noted. Increased interstitial markings raise concern for pulmonary edema. There is no evidence of pleural effusion or pneumothorax. The cardiomediastinal silhouette is normal in size. The patient is status post median sternotomy. No acute osseous abnormalities are seen. IMPRESSION: Vascular congestion noted. Increased interstitial markings raise concern for pulmonary edema. Electronically Signed   By: Garald Balding M.D.   On: 01/25/2016 00:09    ASSESSMENT / PLAN: Acute on Chronic respiratory failure r/t COPD exacerbation, bronchitis,inffluenza pneumonitis / ALI Aib rvr  amio prior to admit Mild Overdiuresis 2/24  PLAN Continue solumedrol -with worsening status have discussed code status, she DOES NOT want ETT or resus She wants comfort if worsen from here, daughter agrees Pandora Leiter also to come from [place]  Continue doxycyline but dc at day 8 = 2//27, dc further Pulmicort to 0.'5mg'$  BID Consider higher solumedrol dosing Get STAT pcxr and assess lasix role DNR / DNI Have to have some interruption BIPAP to avoid facial trauma, consider then high flow for min 20 min  If able to fio2 70-%  with concerns toxicty from O2 No code blue written, remain in SDU  liit BDers as can worsen ARDS  [name]. Titus Mould, MD, [place] Pgr: [facility]

## 2016-01-26 NOTE — Progress Notes (Signed)
Patient ID: [name], female   DOB: May 05, 1941, 75 y.o.   MRN: [id number] I have had extensive discussions with pt and daughter. We discussed patients current circumstances and organ failures. We also discussed patient's prior wishes under circumstances such as this. Family has decided to NOT perform resuscitation if arrest but to continue current medical support for now. NO vent, can keep current therapy but no escalation to vent and if distress - comfort  i called and spoke to daughter who agrees ..isgdf

## 2016-01-26 NOTE — Progress Notes (Signed)
Gherge MD notified that patient sats maintaining 83% on 80% O2 BIPAP & that SBP is over 170; also requested MD to change scheduled medications to IV administration since patient is unable to tolerate coming off BIPAP. New orders with parameters received for hydralazine 5mg  PRN. MD advised this RN to make Pulmonary MD aware of status as well. RT increased BIPAP to 90% O2. Patient maintaining saturations at 89%. Text page sent to on-call pulmonary MD with update on patient status. Will continue to monitor patient closely.

## 2016-01-26 NOTE — Progress Notes (Addendum)
PROGRESS NOTE  [name] [id number] DOB: 1941-02-04 DOA: 01/21/2016 PCP: Nyoka Cowden, MD Outpatient Specialists:    LOS: 11 days   Brief Narrative: 75 year old female with history of paroxysmal A. fib, coronary artery disease, COPD, hypertension, diabetes, admitted on 2/16 with hypoxic respiratory failure found to have influenza, started on Tamiflu as well as antibiotics for community-acquired pneumonia.  Interim summary: Patient completed a course of Tamiflu for influenza, Doxycycline for presumed CAP however her respiratory status remains poor with ongoing needs for NRB, and pulmonology was consulted on 2/22. Patient was transferred to SDU on 2/24 for persistent hypoxia requiring NRB  Assessment & Plan: Principal Problem:   Generalized weakness Active Problems:   Essential hypertension   PVD, s/p AOBF '04, Lt [facility] BPG 9/05   Diabetes mellitus type 2, controlled ([place])   PAF (paroxysmal atrial fibrillation) (HCC)   Weakness   Acute on chronic respiratory failure (HCC)   Elevated troponin   Influenza   SOB (shortness of breath)   Acute on chronic hypoxic respiratory failure post influenza - Her breathing appears to get worse, requiring BiPAP and barely maintaining her sats - Pulm following, discussed with Dr. Titus Mould, he discussed with family, DNR today. If she is to get worse will ensure comfort - She completed treatment with Tamiflu, and completed a course of doxy - Likely in the setting of COPD/sedation, bronchitis as well as influenza, and with potential interstitial process seen on the CT scan possibly due to Amidoarone  Received a page in the afternoon the patient is deteriorating, she is barely maintaining her oxygen saturation despite full BiPAP, reassess patient, discussed with the daughter, she confirmed DO NOT RESUSCITATE DO NOT INTUBATE status, we'll provide supportive treatment with Lasix, Ativan for anxiety, continue full medical treatment with  steroids as well as breathing treatments, discussed with Dr. Nelda Marseille from pulmonology as well. If patient continues to deteriorate, we'll transition care towards comfort.  Coronary artery disease status post CABG - Currently without chest pain - Mild elevation of her troponins appears to be secondary to demand ischemia - EKG without acute ischemic changes  - 2D echo with normal EF and grade 2 DD  AKI  - likely due to Lasix, stable - repeat Lasix today   History of paroxysmal atrial fibrillation  - Presented on bradycardia and sinus currently. Currently on B blocker, holding Cardizem andamiodarone (last one secondary to transaminitis as well as potential interstitial lung disease).  - Continue monitoring on telemetry and continue apixaban  Chronic anemia - Hemoglobin currently stable, improved after transfusion  Chronic diastolic heart failure - CXR with edema, will repeat Lasix  Depression/anxiety - Continue Zoloft   Hyperlipidemia - Holding Lipitor secondary to transaminitis  Transaminitis - Unclear etiology at this moment, may be related to her statin - Amiodarone has been held, however it's unlikely to be out of her system, her LFTs appear to be improving  Diabetes with hyperglycemia -secondary to steroids  -continue SSI  Hypokalemia - Replace and reassess   DVT prophylaxis: Eliquis Code Status: DNR/DNI Family Communication: no family bedside Disposition Plan: TBD Barriers for discharge: hypoxia  Consultants:   Pulmonary   Procedures:   2D echo: EF 50-55%, grade 2 DD  Antimicrobials:  Tamiflu 2/16 >> 2/21  Doxycycline 2/17 >> 2/25  Subjective: - on BiPAP, appears weak. Denies dyspnea  Objective: Filed Vitals:   01/26/16 1137 01/26/16 1230 01/26/16 1240 01/26/16 1250  BP: 190/64 155/69    Pulse: 81 86    Temp:  98.6 F (37 C) 99.5 F (37.5 C)    TempSrc: Axillary Axillary    Resp: 21 20    Height:      Weight:      SpO2: 90% 87% 82% 74%      Intake/Output Summary (Last 24 hours) at 01/26/16 1311 Last data filed at 01/26/16 1230  Gross per 24 hour  Intake      0 ml  Output    375 ml  Net   -375 ml   Filed Weights   01/16/16 0406 01/17/16 0346 01/19/16 0511  Weight: 68.13 kg (150 lb 3.2 oz) 64.501 kg (142 lb 3.2 oz) 69 kg (152 lb 1.9 oz)   Examination: BP 155/69 mmHg  Pulse 86  Temp(Src) 99.5 F (37.5 C) (Axillary)  Resp 20  Ht 5\' 2"  (1.575 m)  Wt 69 kg (152 lb 1.9 oz)  BMI 27.82 kg/m2  SpO2 74% General exam: NAD, BiPAP on Respiratory system: Clear. Mild increased work of breathing. No wheezing Cardiovascular system: regular rate and rhythm, 3/6 SEM. No JVD. No peripheral edema.  Gastrointestinal system: Abdomen is nondistended, soft and nontender. Normal bowel sounds heard. Central nervous system: AxOx3. No focal deficits Extremities: No clubbing/cyanosis Skin: no rashes  Data Reviewed: I have personally reviewed following labs and imaging studies  CBC:  Recent Labs Lab 01/22/16 0600 01/23/16 1504 01/24/16 0420 01/25/16 0035 01/25/16 1122  WBC 11.6* 16.4* 13.0* 19.2* 15.7*  HGB 7.6* 8.1* 7.2* 7.5* 10.5*  HCT 25.6* 28.7* 25.2* 25.9* 33.8*  MCV 79.3 78.4 77.8* 78.7 77.9*  PLT 241 311 265 298 [id number]   Basic Metabolic Panel:  Recent Labs Lab 01/22/16 0600 01/23/16 1504 01/24/16 0420 01/25/16 0035  NA 143 142 142 138  K 3.6 3.5 3.8 5.0  CL 103 99* 104 99*  CO2 28 30 30 26   GLUCOSE 179* 239* 140* 124*  BUN 28* 33* 33* 35*  CREATININE 1.13* 1.07* 1.06* 1.02*  CALCIUM 8.5* 9.2 8.7* 8.9   GFR: Estimated Creatinine Clearance: 44.1 mL/min (by C-G formula based on Cr of 1.02). Liver Function Tests:  Recent Labs Lab 01/22/16 0600 01/25/16 0035  AST 57* 79*  ALT 110* 87*  ALKPHOS 70 90  BILITOT 0.8 1.5*  PROT 5.7* 5.2*  ALBUMIN 2.2* 2.2*   BNP (last 3 results)  Recent Labs  05/21/15 0835  PROBNP 140.0*   HbA1C: No results for input(s): HGBA1C in the last 72 hours. CBG:  Recent  Labs Lab 01/25/16 0736 01/25/16 1144 01/25/16 1733 01/25/16 2130 01/26/16 0749  GLUCAP 158* 166* 148* 140* 161*   Anemia Panel:  Recent Labs  01/25/16 0035  VITAMINB12 1048*  FOLATE 30.5  FERRITIN 369*  TIBC 298  IRON 33  RETICCTPCT 3.9*   Urine analysis:    Component Value Date/Time   COLORURINE YELLOW 01/15/2016 1517   APPEARANCEUR CLOUDY* 01/15/2016 1517   LABSPEC 1.015 01/15/2016 1517   PHURINE 6.0 01/15/2016 1517   GLUCOSEU NEGATIVE 01/15/2016 1517   GLUCOSEU NEGATIVE 02/18/2015 0913   HGBUR NEGATIVE 01/15/2016 1517   HGBUR negative 09/01/2009 [place] 01/15/2016 1517   BILIRUBINUR n 07/18/2013 [place] 01/15/2016 1517   PROTEINUR 30* 01/15/2016 1517   PROTEINUR n 07/18/2013 1604   UROBILINOGEN 0.2 02/18/2015 0913   UROBILINOGEN 1.0 07/18/2013 1604   NITRITE POSITIVE* 01/15/2016 1517   NITRITE positive 07/18/2013 1604   LEUKOCYTESUR NEGATIVE 01/15/2016 1517   Sepsis Labs: Invalid input(s): PROCALCITONIN, LACTICIDVEN  Recent Results (from  the past 240 hour(s))  MRSA PCR Screening     Status: None   Collection Time: 01/23/16  6:50 PM  Result Value Ref Range Status   MRSA by PCR NEGATIVE NEGATIVE Final    Comment:        The GeneXpert MRSA Assay (FDA approved for NASAL specimens only), is one component of a comprehensive MRSA colonization surveillance program. It is not intended to diagnose MRSA infection nor to guide or monitor treatment for MRSA infections.       Radiology Studies: Dg Chest Port 1 View  01/26/2016  CLINICAL DATA:  Influenza EXAM: PORTABLE CHEST 1 VIEW COMPARISON:  January 24, 2016 FINDINGS: There is widespread coarse interstitial and patchy alveolar opacity throughout the lungs bilaterally. Heart is mildly enlarged. There is mild pulmonary venous hypertension. No adenopathy. No effusions. Patient is status post coronary artery bypass grafting. IMPRESSION: Essentially no change from 2 days  prior. Widespread edema with mild cardiomegaly. Suspect congestive heart failure. Superimposed pneumonia may well be present ; both entities may exist concurrently. No new opacity or change in cardiomediastinal silhouette. Electronically Signed   By: Lowella Grip III M.D.   On: 01/26/2016 11:07   Dg Chest Portable 1 View  01/25/2016  CLINICAL DATA:  Acute onset of shortness of breath. Initial encounter. EXAM: PORTABLE CHEST 1 VIEW COMPARISON:  Chest radiograph performed 01/19/2016, and CT of the chest performed 01/21/2016 FINDINGS: The lungs are well-aerated. Vascular congestion is noted. Increased interstitial markings raise concern for pulmonary edema. There is no evidence of pleural effusion or pneumothorax. The cardiomediastinal silhouette is normal in size. The patient is status post median sternotomy. No acute osseous abnormalities are seen. IMPRESSION: Vascular congestion noted. Increased interstitial markings raise concern for pulmonary edema. Electronically Signed   By: Garald Balding M.D.   On: 01/25/2016 00:09   Scheduled Meds: . antiseptic oral rinse  7 mL Mouth Rinse BID  . apixaban  5 mg Oral BID  . aspirin EC  81 mg Oral Daily  . budesonide (PULMICORT) nebulizer solution  0.5 mg Nebulization BID  . gabapentin  600 mg Oral TID  . insulin aspart  0-9 Units Subcutaneous TID WC  . LORazepam  0.5 mg Oral BID  . methylPREDNISolone (SOLU-MEDROL) injection  125 mg Intravenous Q12H  . metoprolol tartrate  12.5 mg Oral BID  . pantoprazole  40 mg Oral BID AC  . polyethylene glycol  17 g Oral BID  . potassium chloride  10 mEq Oral Daily  . sertraline  25 mg Oral Daily   Continuous Infusions:   Marzetta Board, MD, PhD [facility] Pager [phone]  If 7PM-7AM, please contact night-coverage [email or web address] Password [email or web address] 01/26/2016, 1:11 PM

## 2016-01-26 NOTE — Progress Notes (Signed)
12:40 Placed on HFNC 100%, pt SPO2 dropped to 82%.  Placed back on BIPAP FIO2 100%, SPO2 increased to 90%.  Attempted 6L [place] and 100% NRB. PT SPO2 dropped to 74% with good waveform. Placed pt back on BIPAP 100% SPO2 increased to 90%.  RT to monitor and wean as tolerated.

## 2016-01-27 LAB — BASIC METABOLIC PANEL
Anion gap: 14 (ref 5–15)
BUN: 49 mg/dL — AB (ref 6–20)
CO2: 31 mmol/L (ref 22–32)
CREATININE: 1.51 mg/dL — AB (ref 0.44–1.00)
Calcium: 8.9 mg/dL (ref 8.9–10.3)
Chloride: 100 mmol/L — ABNORMAL LOW (ref 101–111)
GFR calc Af Amer: 38 mL/min — ABNORMAL LOW (ref >60)
GFR calc non Af Amer: 33 mL/min — ABNORMAL LOW (ref >60)
Glucose, Bld: 180 mg/dL — ABNORMAL HIGH (ref 65–99)
Potassium: 3.6 mmol/L (ref 3.5–5.1)
SODIUM: 145 mmol/L (ref 135–145)

## 2016-01-27 LAB — CBC
HEMATOCRIT: 37.5 % (ref 36.0–46.0)
HEMOGLOBIN: 11.4 g/dL — AB (ref 12.0–15.0)
MCH: 24 pg — AB (ref 26.0–34.0)
MCHC: 30.4 g/dL (ref 30.0–36.0)
MCV: 78.9 fL (ref 78.0–100.0)
Platelets: 244 10*3/uL (ref 150–400)
RBC: 4.75 MIL/uL (ref 3.87–5.11)
RDW: 17.6 % — AB (ref 11.5–15.5)
WBC: 16.7 10*3/uL — ABNORMAL HIGH (ref 4.0–10.5)

## 2016-01-27 LAB — GLUCOSE, CAPILLARY
GLUCOSE-CAPILLARY: 174 mg/dL — AB (ref 65–99)
GLUCOSE-CAPILLARY: 142 mg/dL — AB (ref 65–99)
Glucose-Capillary: 168 mg/dL — ABNORMAL HIGH (ref 65–99)

## 2016-01-27 MED ORDER — ENOXAPARIN SODIUM 80 MG/0.8ML ~~LOC~~ SOLN
70.0000 mg | SUBCUTANEOUS | Status: DC
Start: 2016-01-27 — End: 2016-01-27
  Administered 2016-01-27: 70 mg via SUBCUTANEOUS
  Filled 2016-01-27: qty 0.7

## 2016-01-27 MED ORDER — SODIUM CHLORIDE 0.9 % IV SOLN
6.0000 mg/h | INTRAVENOUS | Status: DC
  Administered 2016-01-27: 6 mg/h via INTRAVENOUS
  Filled 2016-01-27: qty 10

## 2016-01-27 NOTE — Progress Notes (Signed)
Pt has no O2 reserve once trying to wean off BiPAP to NRB. Pt is more stable with the BiPAP on. Pt tolerates it well.

## 2016-01-27 NOTE — Progress Notes (Signed)
Name: [name] MRN: [id number] DOB: 08-07-1941    ADMISSION DATE:  01/08/2016 CONSULTATION DATE:  01/21/16  REFERRING MD :  Wardell Heath  CHIEF COMPLAINT:  SOB  BRIEF PATIENT DESCRIPTION:   75 y.o. female with history of paroxysmal atrial fibrillation on amiodarone chronically , CAD status post CABG, COPD, hypertension and diabetes mellitus type 2 and fell on 2/15 and was brought to the ER  On 2/16  For the patient's daughter found the patient was weak and had some slurred speech. Patient uses 2 liters of oxygen at home. Patient is currently diagnosed with influenza on  Tamiflu and Doxycycline. On 2/22/ while working with the PT her O2 dropped down to 79% from 87-89% then maintained in between 82-85%.  Therefore PCCM team was consulted on 01/21/16  SIGNIFICANT EVENTS and STUDIES  ED  2/16 with increased weakness and slurred speech CT head >2/16 was neg Influenza A >2/16 positive CT chest 2/23>>>Emphysematous changes with superimposed interstitial infiltrates/   No distress, remains with some hypoxia - ESR 01/23/26 = 50   2/28 family asking to transition to comfort. Pt stating just wants to "go to sleep".   SUBJECTIVE/OVERNIGHT/INTERVAL HX bipap still at 90%  VITAL SIGNS: Temp:  [96.8 F (36 C)-99.4 F (37.4 C)] 98.4 F (36.9 C) (02/28 1200) Pulse Rate:  [53-92] 59 (02/28 1200) Resp:  [16-23] 20 (02/28 1200) BP: (147-186)/(49-76) 186/57 mmHg (02/28 1200) SpO2:  [88 %-96 %] 92 % (02/28 1200) FiO2 (%):  [90 %-100 %] 90 % (02/28 1200)  Intake/Output Summary (Last 24 hours) at 01/27/16 1402 Last data filed at 01/27/16 0400  Gross per 24 hour  Intake      0 ml  Output   2450 ml  Net  -2450 ml   PHYSICAL EXAMINATION: General:  Elderly female  Confused today. Lying in bed on bipap  Neuro:  Awake, alert,oriented, nonfocal HEENT: jvd wnl Cardiovascular:aFIB, no murmur ,gallop or rub noted Lungs: coarse crackles + accessory use  Abdomen: soft ,non tender, good bowel sounds  Skin:  Intact   Recent Labs Lab 01/24/16 0420 01/25/16 0035 01/27/16 0338  NA 142 138 145  K 3.8 5.0 3.6  CL 104 99* 100*  CO2 '30 26 31  '$ BUN 33* 35* 49*  CREATININE 1.06* 1.02* 1.51*  GLUCOSE 140* 124* 180*    Recent Labs Lab 01/25/16 0035 01/25/16 1122 01/27/16 0338  HGB 7.5* 10.5* 11.4*  HCT 25.9* 33.8* 37.5  WBC 19.2* 15.7* 16.7*  PLT 298 273 244   Dg Chest Port 1 View  01/26/2016  CLINICAL DATA:  Influenza EXAM: PORTABLE CHEST 1 VIEW COMPARISON:  January 24, 2016 FINDINGS: There is widespread coarse interstitial and patchy alveolar opacity throughout the lungs bilaterally. Heart is mildly enlarged. There is mild pulmonary venous hypertension. No adenopathy. No effusions. Patient is status post coronary artery bypass grafting. IMPRESSION: Essentially no change from 2 days prior. Widespread edema with mild cardiomegaly. Suspect congestive heart failure. Superimposed pneumonia may well be present ; both entities may exist concurrently. No new opacity or change in cardiomediastinal silhouette. Electronically Signed   By: Lowella Grip III M.D.   On: 01/26/2016 11:07    ASSESSMENT / PLAN: Acute on Chronic respiratory failure r/t COPD exacerbation, bronchitis,influenza pneumonitis / ALI Aib rvr  amio prior to admit Mild Overdiuresis 2/24 AKI Acute delirium-->likely d/t morphine and hypoxia    Long d/w pt and family. Had really no sig improvement. Now confused at times. Still rapidly desaturates. Explained that  it may be weeks before O2 needs decrease and there is no guarantee that she will. She does not want to cont care if she cannot return to prior health. Family feels certain that stopping the BIPAP would be consistent with her wishes. They want to keep her comfortable and stop the BIPAP machine.   PLAN Cont BDs Morphine gtt Dc all meds that don't contribute to comfort Dc bipap-->place high flow Family at bedside   Erick Colace ACNP-BC [place] Pager # [phone] OR # [phone] if no answer   STAFF NOTE: I, Merrie Roof, MD FACP have personally reviewed patient's available data, including medical history, events of note, physical examination and test results as part of my evaluation. I have discussed with resident/NP and other care providers such as pharmacist, RN and RRT. In addition, I personally evaluated patient and elicited key findings of: he is dying actively,. She is in distress now, increase WOB worse compared to day prior, she is NOT responding to steroids, she was neg 3 liters and has worsened, she told me day prior she does nto want to suffer, will push forward for full comfort care, morphine drip, I spoke to daughter and son at bedside and pt  [name]. Titus Mould, MD, [place] Pgr: [facility] 01/27/2016 2:58 PM

## 2016-01-27 NOTE — Progress Notes (Signed)
ANTICOAGULATION CONSULT NOTE - Initial Consult  Pharmacy Consult for Lovenox Indication: atrial fibrillation  Allergies  Allergen Reactions  . Codeine Nausea And Vomiting    Patient Measurements: Height: 5\' 2"  (157.5 cm) Weight: 152 lb 1.9 oz (69 kg) IBW/kg (Calculated) : 50.1  Vital Signs: Temp: 97.9 F (36.6 C) (02/28 0400) Temp Source: Oral (02/28 0400) BP: 152/50 mmHg (02/28 0400) Pulse Rate: 54 (02/28 0400)  Labs:  Recent Labs  01/25/16 0035 01/25/16 1122 01/27/16 0338  HGB 7.5* 10.5* 11.4*  HCT 25.9* 33.8* 37.5  PLT 298 273 244  CREATININE 1.02*  --  1.51*    Estimated Creatinine Clearance: 29.8 mL/min (by C-G formula based on Cr of 1.51).   Medical History: Past Medical History  Diagnosis Date  . ANEMIA 08/28/2010  . CAD (coronary artery disease)     a. s/p CABGx4 in 2001.  . Adenomatous colon polyp 2000  . COPD (chronic obstructive pulmonary disease) ([place])   . Depressive disorder   . GERD 12/19/2009  . Hyperlipidemia   . Hypertension   . HYPOTENSION 08/11/2010  . PVD (peripheral vascular disease) ([place])     a. Duplex 03/2013: stable moderate carotid dz - 123456 RICA, [id number] LICA, L subclavian artery to CCA stent widely patent.  b. Prior R fempop bypass graft, R CIA stent.  . Lung abscess ([place]) 2011  . CHF (congestive heart failure) ([place])   . Diverticulosis   . Aortic stenosis     a. Mild by echo 01/2013.  Marland Kitchen Ringing in ears   . Anxiety   . Joint pain   . On home oxygen therapy     "2L at night; sometimes 2L after work" (01/15/2016)  . Atrial fibrillation ([place])   . DVT (deep venous thrombosis) (HCC) X 1    RLE  . Type II diabetes mellitus ([place])   . History of blood transfusion 2011; 08/2014    "related to abscess on my lung OR; internal bleeding, had dark stools"  . Stroke [facility]) ~ 01/2014    "TIA"  . Hiatal hernia     a. Dx 01/2014, placed on Xarelto.  . Osteoarthritis     Lumbar stenosis  . Arthritis     "hands maily; arms too" (11/27/2015)     Medications:  Prescriptions prior to admission  Medication Sig Dispense Refill Last Dose  . albuterol (PROVENTIL HFA;VENTOLIN HFA) 108 (90 Base) MCG/ACT inhaler Inhale 2 puffs into the lungs every 4 (four) hours as needed for wheezing or shortness of breath. 5 Inhaler 3 01/06/2016 at Unknown time  . amiodarone (PACERONE) 200 MG tablet Take 0.5 tablets (100 mg total) by mouth daily. 90 tablet 3 01/21/2016 at Unknown time  . aspirin EC 81 MG EC tablet Take 1 tablet (81 mg total) by mouth daily. 30 tablet 0 01/26/2016 at Unknown time  . atorvastatin (LIPITOR) 40 MG tablet TAKE 1 TABLET (40 MG TOTAL) BY MOUTH EVERY EVENING. 90 tablet 3 01/13/2016 at Unknown time  . Diclofenac Sodium 1 % CREA Place 1 application onto the skin 2 (two) times daily. (Patient taking differently: Place 1 application onto the skin 2 (two) times daily as needed (pain). ) 120 g 4 Past Month at Unknown time  . diltiazem (CARDIZEM CD) 120 MG 24 hr capsule TAKE ONE CAPSULE BY MOUTH DAILY 30 capsule 9 01/05/2016 at Unknown time  . ELIQUIS 5 MG TABS tablet TAKE 1 TABLET (5 MG TOTAL) BY MOUTH 2 (TWO) TIMES DAILY. 60 tablet 3 01/17/2016 at am  .  furosemide (LASIX) 20 MG tablet Take 1 tablet (20 mg total) by mouth daily. 90 tablet 3 01/22/2016 at Unknown time  . gabapentin (NEURONTIN) 600 MG tablet TAKE 1 TABLET (600 MG TOTAL) BY MOUTH 3 (THREE) TIMES DAILY. 270 tablet 1 01/09/2016 at Unknown time  . guaiFENesin-dextromethorphan (ROBITUSSIN DM) 100-10 MG/5ML syrup Take 5 mLs by mouth every 4 (four) hours as needed for cough. 118 mL 0 01/11/2016 at Unknown time  . LORazepam (ATIVAN) 0.5 MG tablet TAKE ONE TABLET BY MOUTH TWICE DAILY 60 tablet 2 01/20/2016 at am  . metFORMIN (GLUCOPHAGE) 500 MG tablet TAKE 1 TABLET (500 MG TOTAL) BY MOUTH 2 (TWO) TIMES DAILY WITH A MEAL. 180 tablet 1 01/26/2016 at am  . metoprolol tartrate (LOPRESSOR) 25 MG tablet Take 1 tablet (25 mg total) by mouth 2 (two) times daily. 180 tablet 3 01/20/2016 at 1000  .  Multiple Vitamins-Minerals (CENTRUM SILVER ULTRA WOMENS PO) Take 1 capsule by mouth daily.    01/24/2016 at Unknown time  . nitroGLYCERIN (NITROSTAT) 0.4 MG SL tablet Place 0.4 mg under the tongue every 5 (five) minutes as needed for chest pain.   prn  . potassium chloride (K-DUR,KLOR-CON) 10 MEQ tablet TAKE ONE TABLET BY MOUTH DAILY 30 tablet 1 01/13/2016 at Unknown time  . predniSONE (DELTASONE) 20 MG tablet Take 1 tablet (20 mg total) by mouth daily with breakfast. 16 tablet 0 01/11/2016 at Unknown time  . promethazine (PHENERGAN) 25 MG tablet TAKE 1 TABLET BY MOUTH EVERY 6 HOURS AS NEEDED FOR NAUSEA OR VOMITING 20 tablet 1 01/13/2016 at Unknown time  . SPIRIVA HANDIHALER 18 MCG inhalation capsule PLACE 1 CAPSULE (18 MCG TOTAL) INTO INHALER AND INHALE DAILY. 30 capsule 5 01/16/2016 at Unknown time  . triamcinolone cream (KENALOG) 0.1 % Apply 1 application topically daily as needed (affected area).   0 Past Month at Unknown time   Scheduled:  . antiseptic oral rinse  7 mL Mouth Rinse BID  . aspirin EC  81 mg Oral Daily  . budesonide (PULMICORT) nebulizer solution  0.5 mg Nebulization BID  . enoxaparin (LOVENOX) injection  70 mg Subcutaneous Q24H  . gabapentin  600 mg Oral TID  . insulin aspart  0-9 Units Subcutaneous TID WC  . LORazepam  0.5 mg Oral BID  . methylPREDNISolone (SOLU-MEDROL) injection  125 mg Intravenous Q12H  . metoprolol tartrate  12.5 mg Oral BID  . pantoprazole  40 mg Oral BID AC  . polyethylene glycol  17 g Oral BID  . potassium chloride  10 mEq Oral Daily  . sertraline  25 mg Oral Daily    Assessment: 75yo female admitted for hypoxic respiratory failure now requiring BiPAP and cannot take Eliquis (last dose taken 2/27 2200), to transition to LMWH.  Goal of Therapy:  Anti-Xa level 0.6-1 units/ml 4hrs after LMWH dose given Monitor platelets by anticoagulation protocol: Yes   Plan:  Will start Lovenox 70mg  SQ Q24H and monitor CBC.  Wynona Neat, PharmD, BCPS   01/27/2016,7:10 AM

## 2016-01-27 NOTE — Progress Notes (Signed)
PT Cancellation Note  Patient Details Name: [name] MRN: [id number] DOB: Feb 19, 1941   Cancelled Treatment:    Reason Eval/Treat Not Completed: Medical issues which prohibited therapy. Hold per nursing, to check back if respiratory status has improved. Will follow up.   Colon Branch, SPT Colon Branch 01/27/2016, 9:47 AM

## 2016-01-27 NOTE — Progress Notes (Addendum)
PROGRESS NOTE  [name] [id number] DOB: 08/11/1941 DOA: 12/31/2015 PCP: Nyoka Cowden, MD Outpatient Specialists:    LOS: 12 days   Brief Narrative: 75 year old female with history of paroxysmal A. fib, coronary artery disease, COPD, hypertension, diabetes, admitted on 2/16 with hypoxic respiratory failure found to have influenza, started on Tamiflu as well as antibiotics for community-acquired pneumonia.  Interim summary: Patient completed a course of Tamiflu for influenza, Doxycycline for presumed CAP however her respiratory status remains poor with ongoing needs for NRB, and pulmonology was consulted on 2/22. Patient was transferred to SDU on 2/24 for persistent hypoxia requiring NRB  Assessment & Plan: Principal Problem:   Generalized weakness Active Problems:   Essential hypertension   PVD, s/p AOBF '04, Lt SCA BPG 9/05   Diabetes mellitus type 2, controlled ([place])   PAF (paroxysmal atrial fibrillation) (HCC)   Weakness   Acute on chronic respiratory failure (HCC)   Elevated troponin   Influenza   SOB (shortness of breath)   Acute on chronic hypoxic respiratory failure post influenza / ARDS - Her breathing appears to get worse, requiring BiPAP and barely maintaining her sats - Pulm following, discussed with Dr. Titus Mould, he discussed with family, DNR. If she is to get worse will ensure comfort - She completed treatment with Tamiflu, and completed a course of doxy - Likely in the setting of COPD/sedation, bronchitis as well as influenza, and with potential interstitial process seen on the CT scan possibly due to Amidoarone  - difficult situation, continues to be critically ill and is BiPAP dependent - ativan for anxiety, morphine for pain  Coronary artery disease status post CABG - Currently without chest pain - Mild elevation of her troponins appears to be secondary to demand ischemia - EKG without acute ischemic changes  - 2D echo with normal EF  and grade 2 DD  AKI  - likely due to Lasix, slight increase in her Cr today due to Lasix 2/27 - hold Lasix today   History of paroxysmal atrial fibrillation  - Presented on bradycardia and sinus currently. Currently on B blocker, holding Cardizem andamiodarone (last one secondary to transaminitis as well as potential interstitial lung disease).  - Continue monitoring on telemetry  - unable to have Apixiban due to BiPAP, switch to Lovenox  Chronic anemia - Hemoglobin currently stable, improved after transfusion  Chronic diastolic heart failure - CXR 2/27 with edema, s/p Lasix x 2 on 2/27 - increase in Cr today   Depression/anxiety - Continue Zoloft  - Ativan IV  Hyperlipidemia - Holding Lipitor secondary to transaminitis  Transaminitis - Unclear etiology at this moment, may be related to her statin - Amiodarone has been held, however it's unlikely to be out of her system, her LFTs appear to be improving  Diabetes with hyperglycemia -secondary to steroids  -continue SSI  Hypokalemia - Replace and reassess   DVT prophylaxis: Eliquis Code Status: DNR/DNI Family Communication: d/w daughter bedside Disposition Plan: TBD Barriers for discharge: hypoxia  Consultants:   Pulmonary   Procedures:   2D echo: EF 50-55%, grade 2 DD  Antimicrobials:  Tamiflu 2/16 >> 2/21  Doxycycline 2/17 >> 2/25  Subjective: - on BiPAP. Confused this morning thinks she is "at home" - states she feels 99% better  Objective: Filed Vitals:   01/26/16 2253 01/27/16 0000 01/27/16 0311 01/27/16 0400  BP: 147/56 153/50 150/50 152/50  Pulse: 63 60 62 54  Temp:  98.2 F (36.8 C)  97.9 F (36.6 C)  TempSrc:  Oral  Oral  Resp: 16 17 16 16   Height:      Weight:      SpO2: 96% 93% 93% 95%    Intake/Output Summary (Last 24 hours) at 01/27/16 0653 Last data filed at 01/26/16 2211  Gross per 24 hour  Intake      0 ml  Output   2050 ml  Net  -2050 ml   Filed Weights   01/16/16  0406 01/17/16 0346 01/19/16 0511  Weight: 68.13 kg (150 lb 3.2 oz) 64.501 kg (142 lb 3.2 oz) 69 kg (152 lb 1.9 oz)   Examination: BP 152/50 mmHg  Pulse 54  Temp(Src) 97.9 F (36.6 C) (Oral)  Resp 16  Ht 5\' 2"  (1.575 m)  Wt 69 kg (152 lb 1.9 oz)  BMI 27.82 kg/m2  SpO2 95% General exam: NAD, BiPAP on Respiratory system: Increased work of breathing. No wheezing Cardiovascular system: regular rate and rhythm, 3/6 SEM. No JVD. No peripheral edema.  Gastrointestinal system: Abdomen is nondistended, soft and nontender. Normal bowel sounds heard. Central nervous system: AxOx2-3. Initially confused then realized she is in the hospital. No focal deficits Extremities: No clubbing/cyanosis Skin: no rashes  Data Reviewed: I have personally reviewed following labs and imaging studies  CBC:  Recent Labs Lab 01/23/16 1504 01/24/16 0420 01/25/16 0035 01/25/16 1122 01/27/16 0338  WBC 16.4* 13.0* 19.2* 15.7* 16.7*  HGB 8.1* 7.2* 7.5* 10.5* 11.4*  HCT 28.7* 25.2* 25.9* 33.8* 37.5  MCV 78.4 77.8* 78.7 77.9* 78.9  PLT 311 265 298 273 [id number]   Basic Metabolic Panel:  Recent Labs Lab 01/22/16 0600 01/23/16 1504 01/24/16 0420 01/25/16 0035 01/27/16 0338  NA 143 142 142 138 145  K 3.6 3.5 3.8 5.0 3.6  CL 103 99* 104 99* 100*  CO2 28 30 30 26 31   GLUCOSE 179* 239* 140* 124* 180*  BUN 28* 33* 33* 35* 49*  CREATININE 1.13* 1.07* 1.06* 1.02* 1.51*  CALCIUM 8.5* 9.2 8.7* 8.9 8.9   GFR: Estimated Creatinine Clearance: 29.8 mL/min (by C-G formula based on Cr of 1.51). Liver Function Tests:  Recent Labs Lab 01/22/16 0600 01/25/16 0035  AST 57* 79*  ALT 110* 87*  ALKPHOS 70 90  BILITOT 0.8 1.5*  PROT 5.7* 5.2*  ALBUMIN 2.2* 2.2*   BNP (last 3 results)  Recent Labs  05/21/15 0835  PROBNP 140.0*   CBG:  Recent Labs Lab 01/26/16 0749 01/26/16 1159 01/26/16 1723 01/26/16 2136 01/27/16 0319  GLUCAP 161* 112* 165* 152* 168*   Anemia Panel:  Recent Labs  01/25/16 0035    VITAMINB12 1048*  FOLATE 30.5  FERRITIN 369*  TIBC 298  IRON 33  RETICCTPCT 3.9*   Urine analysis:    Component Value Date/Time   COLORURINE YELLOW 01/15/2016 1517   APPEARANCEUR CLOUDY* 01/15/2016 1517   LABSPEC 1.015 01/15/2016 1517   PHURINE 6.0 01/15/2016 1517   GLUCOSEU NEGATIVE 01/15/2016 1517   GLUCOSEU NEGATIVE 02/18/2015 0913   HGBUR NEGATIVE 01/15/2016 1517   HGBUR negative 09/01/2009 [place] 01/15/2016 1517   BILIRUBINUR n 07/18/2013 [place] 01/15/2016 1517   PROTEINUR 30* 01/15/2016 1517   PROTEINUR n 07/18/2013 1604   UROBILINOGEN 0.2 02/18/2015 0913   UROBILINOGEN 1.0 07/18/2013 1604   NITRITE POSITIVE* 01/15/2016 1517   NITRITE positive 07/18/2013 1604   LEUKOCYTESUR NEGATIVE 01/15/2016 1517   Sepsis Labs: Invalid input(s): PROCALCITONIN, LACTICIDVEN  Recent Results (from the past 240 hour(s))  MRSA PCR Screening  Status: None   Collection Time: 01/23/16  6:50 PM  Result Value Ref Range Status   MRSA by PCR NEGATIVE NEGATIVE Final    Comment:        The GeneXpert MRSA Assay (FDA approved for NASAL specimens only), is one component of a comprehensive MRSA colonization surveillance program. It is not intended to diagnose MRSA infection nor to guide or monitor treatment for MRSA infections.       Radiology Studies: Dg Chest Port 1 View  01/26/2016  CLINICAL DATA:  Influenza EXAM: PORTABLE CHEST 1 VIEW COMPARISON:  January 24, 2016 FINDINGS: There is widespread coarse interstitial and patchy alveolar opacity throughout the lungs bilaterally. Heart is mildly enlarged. There is mild pulmonary venous hypertension. No adenopathy. No effusions. Patient is status post coronary artery bypass grafting. IMPRESSION: Essentially no change from 2 days prior. Widespread edema with mild cardiomegaly. Suspect congestive heart failure. Superimposed pneumonia may well be present ; both entities may exist concurrently. No new  opacity or change in cardiomediastinal silhouette. Electronically Signed   By: Lowella Grip III M.D.   On: 01/26/2016 11:07   Scheduled Meds: . antiseptic oral rinse  7 mL Mouth Rinse BID  . apixaban  5 mg Oral BID  . aspirin EC  81 mg Oral Daily  . budesonide (PULMICORT) nebulizer solution  0.5 mg Nebulization BID  . gabapentin  600 mg Oral TID  . insulin aspart  0-9 Units Subcutaneous TID WC  . LORazepam  0.5 mg Oral BID  . methylPREDNISolone (SOLU-MEDROL) injection  125 mg Intravenous Q12H  . metoprolol tartrate  12.5 mg Oral BID  . pantoprazole  40 mg Oral BID AC  . polyethylene glycol  17 g Oral BID  . potassium chloride  10 mEq Oral Daily  . sertraline  25 mg Oral Daily   Continuous Infusions:   Marzetta Board, MD, PhD [facility] Pager [phone]  If 7PM-7AM, please contact night-coverage [email or web address] Password [email or web address] 01/27/2016, 6:53 AM

## 2016-01-27 NOTE — Progress Notes (Signed)
PT Cancellation Note  Patient Details Name: [name] MRN: [id number] DOB: September 30, 1941   Cancelled Treatment:    Reason Eval/Treat Not Completed: Medical issues which prohibited therapy (Per Dr. Titus Mould - HOLD PT today.  Will check tomorrow.)   [name] F 01/27/2016, 11:19 AM  [name] [phone] (pager)

## 2016-01-28 DEATH — deceased

## 2016-02-02 ENCOUNTER — Ambulatory Visit: Payer: Medicare Other | Admitting: Pulmonary Disease

## 2016-02-16 ENCOUNTER — Ambulatory Visit: Payer: Medicare Other | Admitting: Cardiology

## 2016-02-24 ENCOUNTER — Ambulatory Visit: Payer: Medicare Other | Admitting: Vascular Surgery

## 2016-02-28 NOTE — Discharge Summary (Addendum)
   Physician Death Summary  [name] [id number] DOB: 1941/08/02 DOA: 02/12/2016  PCP: Nyoka Cowden, MD  Admit date: 12-Feb-2016 Death date: 26-Feb-2016  Discharge Diagnoses:  Principal Problem:   Generalized weakness Active Problems:   Essential hypertension   PVD, s/p AOBF '04, Lt SCA BPG 9/05   Diabetes mellitus type 2, controlled (HCC)   PAF (paroxysmal atrial fibrillation) (HCC)   Weakness   Acute on chronic respiratory failure (HCC)   Elevated troponin   Influenza   SOB (shortness of breath) COPD  History of present illness:  See H&P, Labs, Consult and Test reports for all details in brief, patient is a 75 year old female with history of paroxysmal A. fib, coronary artery disease, COPD, hypertension, diabetes, admitted on 2/16 with hypoxic respiratory failure found to have influenza, started on Tamiflu as well as antibiotics for community-acquired pneumonia.  Hospital Course:  Acute on chronic hypoxic respiratory failure post influenza / ARDS with underlying COPD- patient was admitted to the floor with acute on chronic hypoxic respiratory failure, influenza positive, status post completed treatment, however she progressed to become more and more hypoxic having increased oxygen requirement, requiring pulmonology consult and transfer to stepdown unit. Patient was diuresed aggressively, as well as maintain on nonrebreather and BiPAP as tolerated, received IV steroids, however she expressed her wishes not to be intubated and if she is not to recover to pursue comfort measures. Unfortunately her breathing deteriorated, pulmonology discussed with the patient and the patient's daughter, and she was transitioned to comfort on 24-Feb-2023, and passed away on 3/1 in the morning. Coronary artery disease status post CABG AKI - likely due to Lasix History of paroxysmal atrial fibrillation -  she was previously on amiodarone, potentially had amiodarone-induced lung changes as per lung  CT scan with interstitial changes, as a contributor to #1  Chronic anemia COPD Chronic diastolic heart failure Depression/anxiety Hyperlipidemia Transaminitis Diabetes with hyperglycemia Hypokalemia  Consultations:  PCCM  SignedMarzetta Board  [facility] February 26, 2016, 2:49 PM

## 2016-02-28 NOTE — Progress Notes (Signed)
Pt monitor showed asystole at 0600.  This RN and fellow RN Estill Bamberg Pettiford responded at this time.  The patient was found unresponsive, had no audible breath sounds and was without a palpable pulse in either the carotid or femoral arteries.  Time of death was pronounced by both RN's present at [facility] on 02-04-16. This patient was DNR and on comfort care at time of death.  Family was present at the bedside.

## 2016-02-28 DEATH — deceased

## 2016-03-22 ENCOUNTER — Encounter: Payer: Medicare Other | Admitting: Internal Medicine

## 2016-10-26 ENCOUNTER — Ambulatory Visit: Payer: Medicare Other | Admitting: Vascular Surgery

## 2016-10-26 ENCOUNTER — Encounter (HOSPITAL_COMMUNITY): Payer: Medicare Other

## 2017-01-20 IMAGING — DX DG CHEST 2V
2 series · 2 of 2 positions shown · non-contrast
Comparison: August 18, 2015.

CLINICAL DATA: Shortness of breath.

EXAM:
CHEST  2 VIEW

[w chest pa]
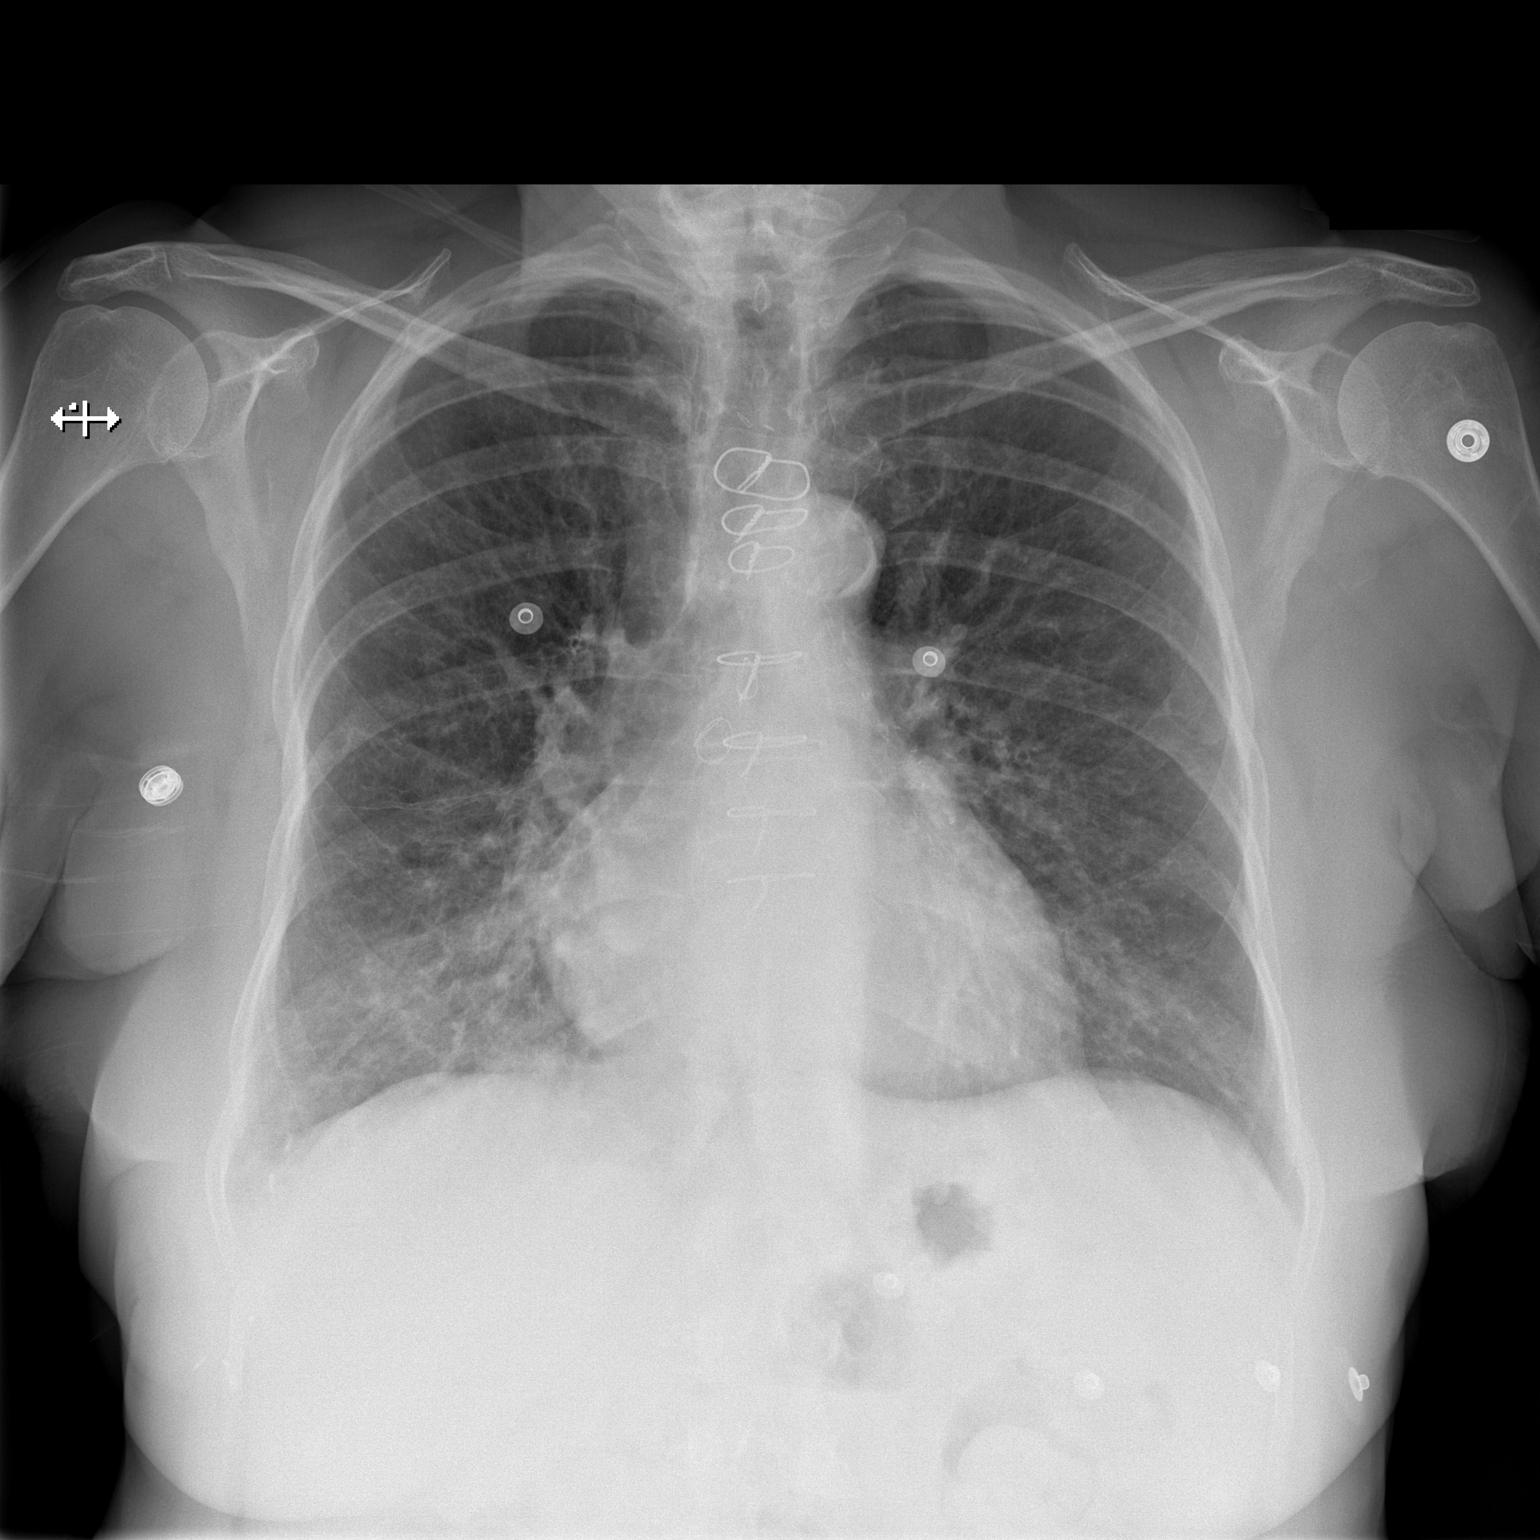

[w chest lat]
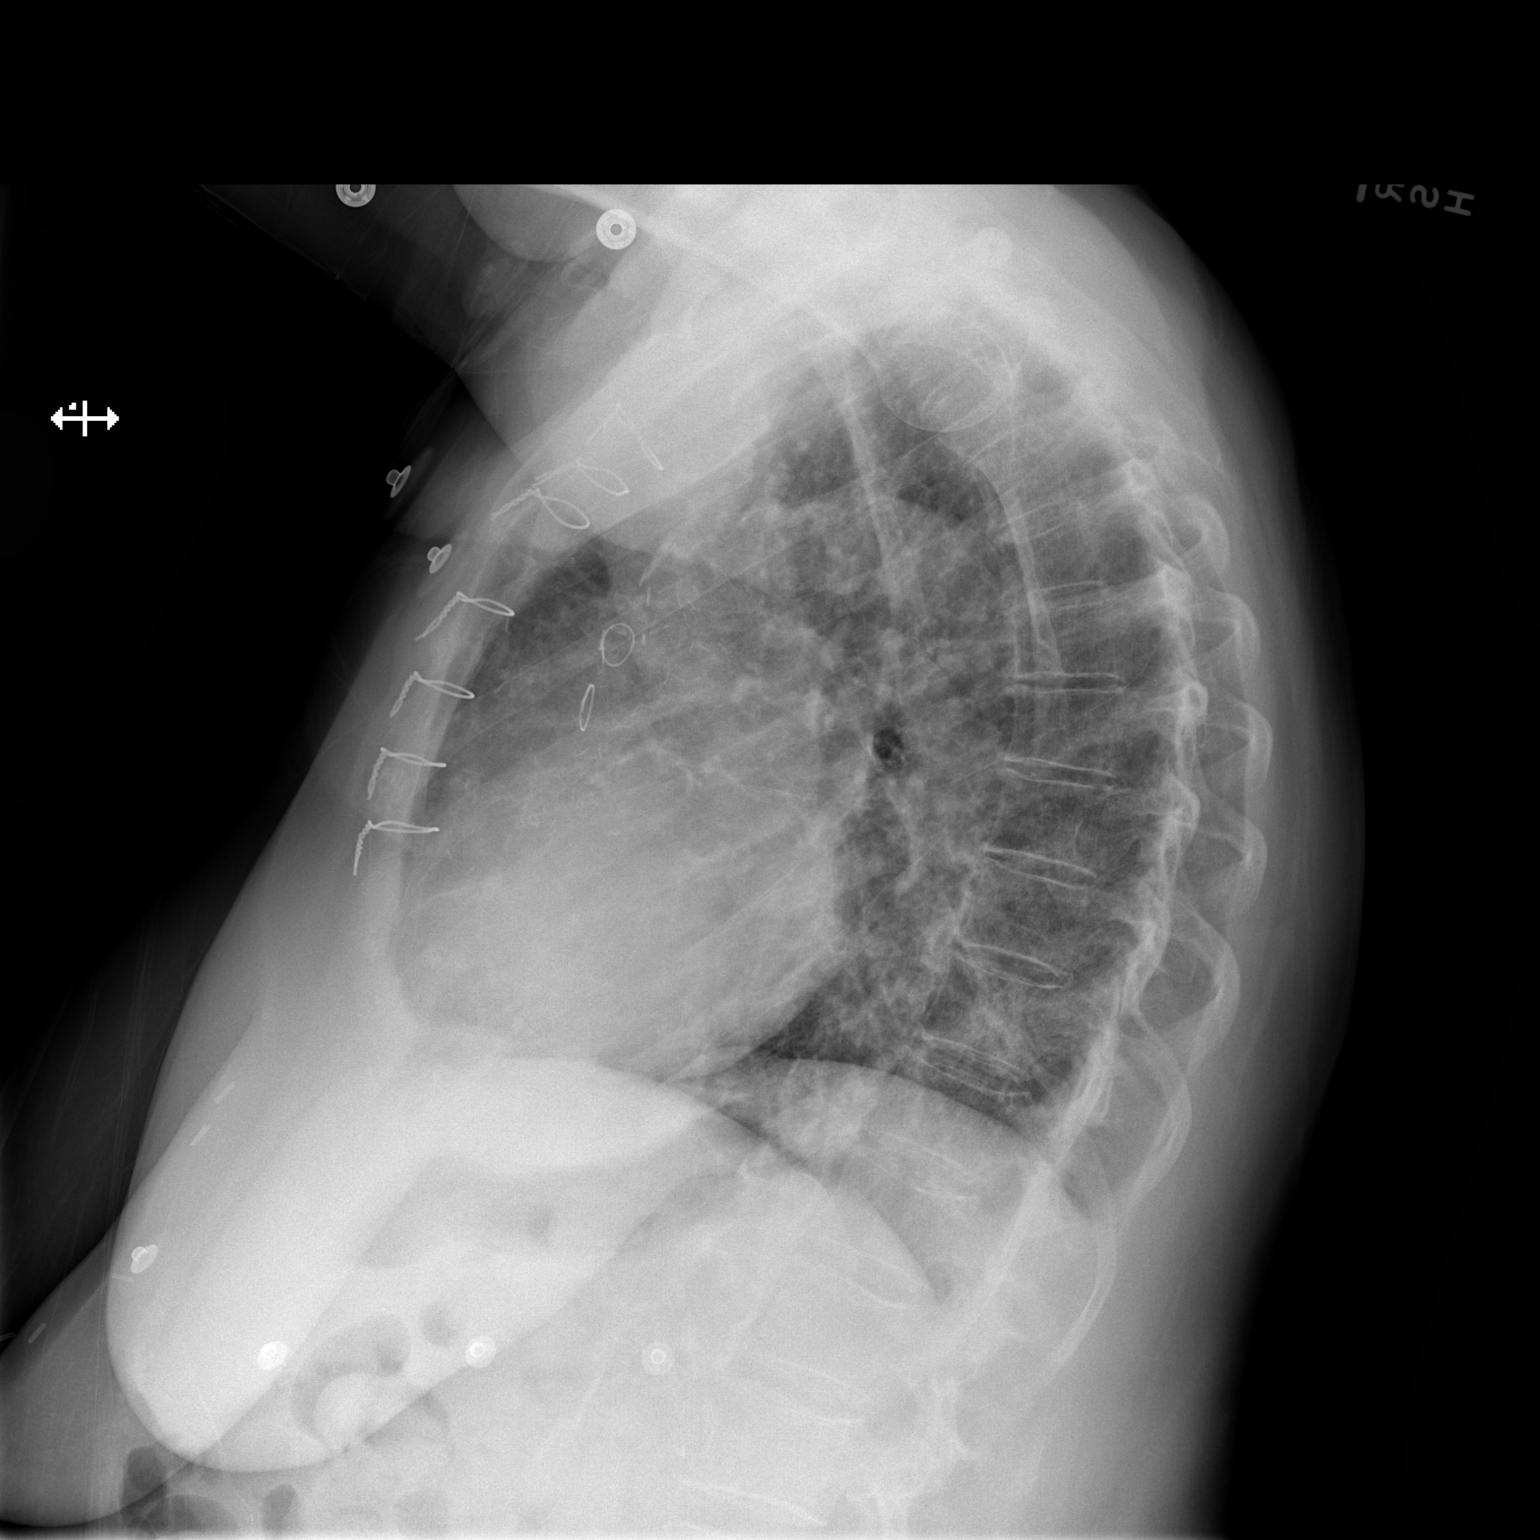

[2 of 2 positions shown; findings below may reference images not displayed]

FINDINGS: The heart size and mediastinal contours are within normal limits. No
pneumothorax or pleural effusion is noted. Status post coronary
artery bypass graft. Atherosclerosis of thoracic aorta is noted.
Stable bibasilar densities are noted concerning for scarring or
subsegmental atelectasis. The visualized skeletal structures are
unremarkable.
IMPRESSION: Stable bibasilar scarring or subsegmental atelectasis.

## 2017-03-10 IMAGING — CT CT HEAD W/O CM
2 series · 15 of 30 positions shown, 17 images · non-contrast
Comparison: 11/27/2015

CLINICAL DATA: Generalize weakness, fatigue, weight gain, and
altered mental status. Fall.

EXAM:
CT HEAD WITHOUT CONTRAST
TECHNIQUE: Contiguous axial images were obtained from the base of the skull
through the vertex without intravenous contrast.

[Series 2: head without ax · axial · non-contrast · 0.42mm/px · z∈[-111,-12]mm · 7 of 28 slices shown, 9 images]
[im 4/28  brain]
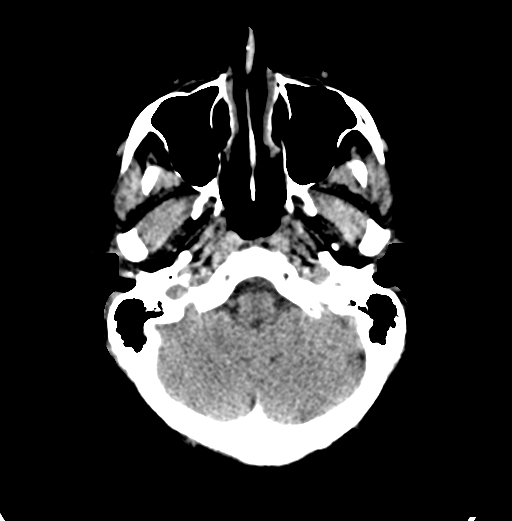
[im 4/28  bone]
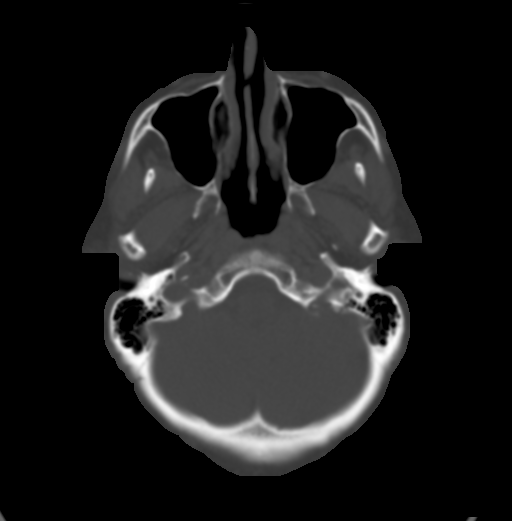
[im 7/28  brain]
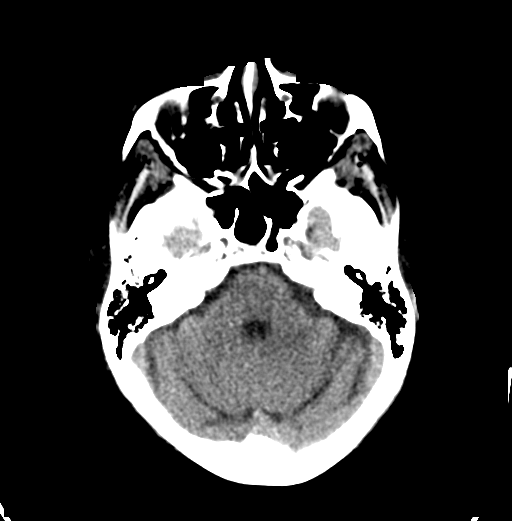
[im 11/28  brain]
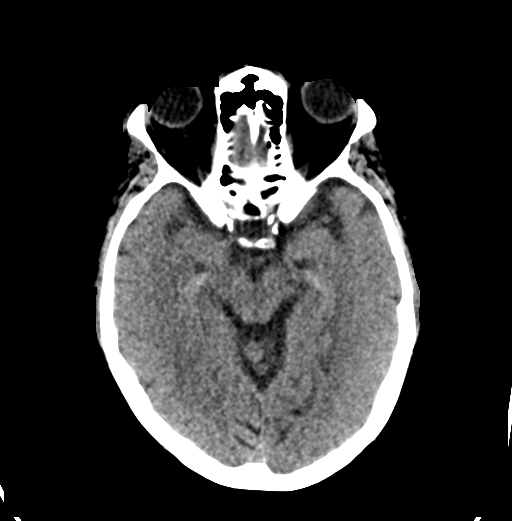
[im 14/28  brain]
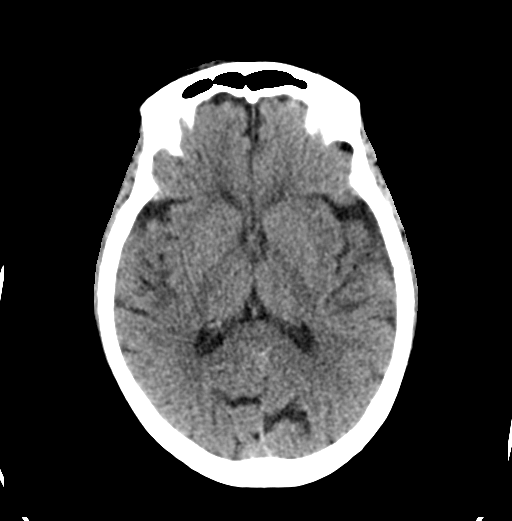
[im 17/28  brain]
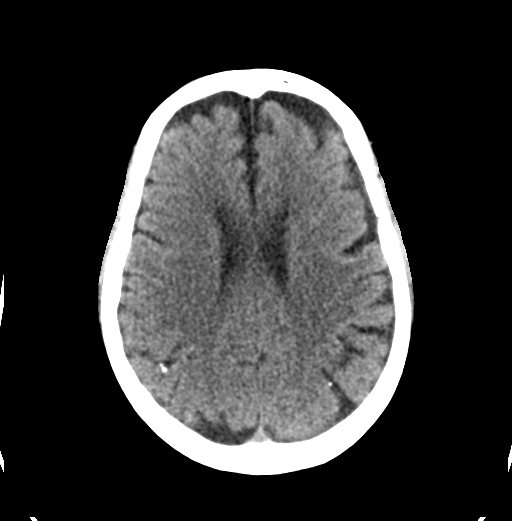
[im 17/28  bone]
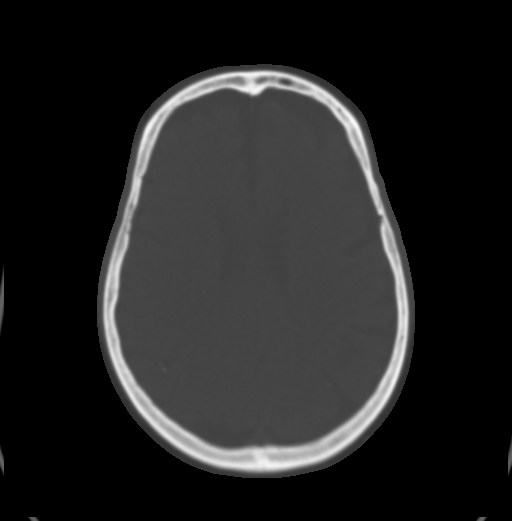
[im 21/28  brain]
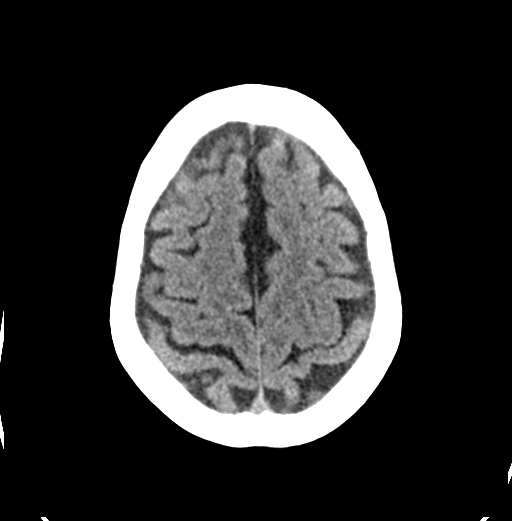
[im 24/28  brain]
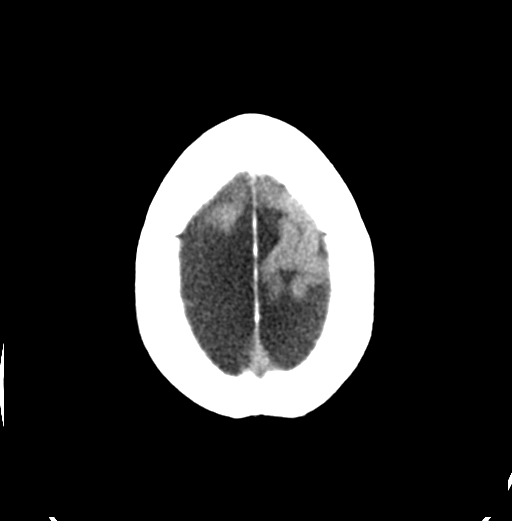

[Series 3: head bone · axial · 0.42mm/px · z∈[-116,-6]mm · 8 of 70 slices shown]
[im 7/70  bone]
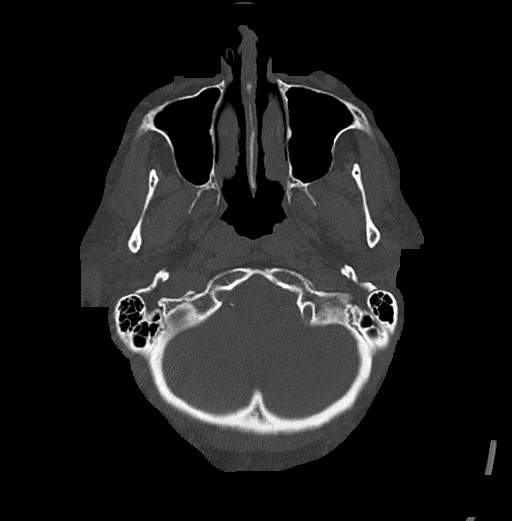
[im 14/70  bone]
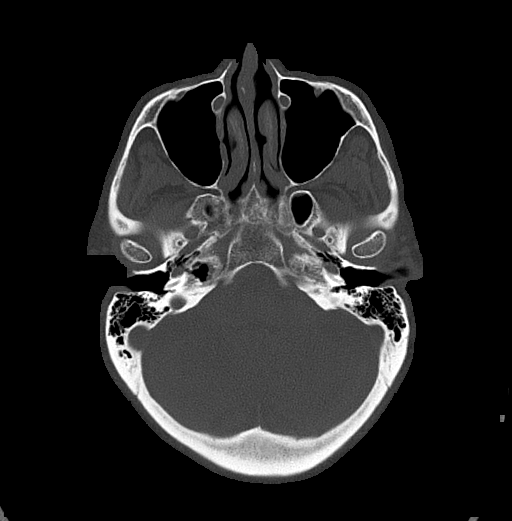
[im 21/70  bone]
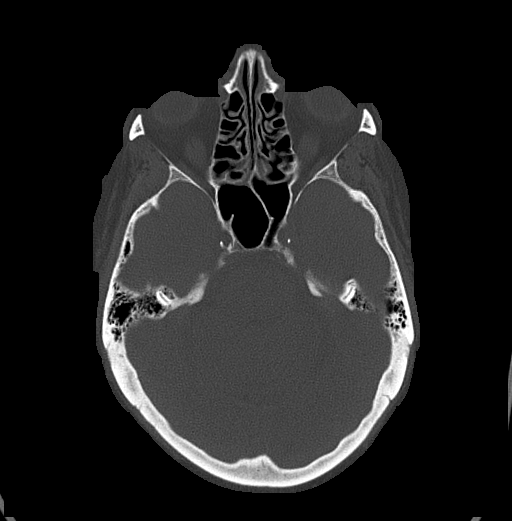
[im 32/70  bone]
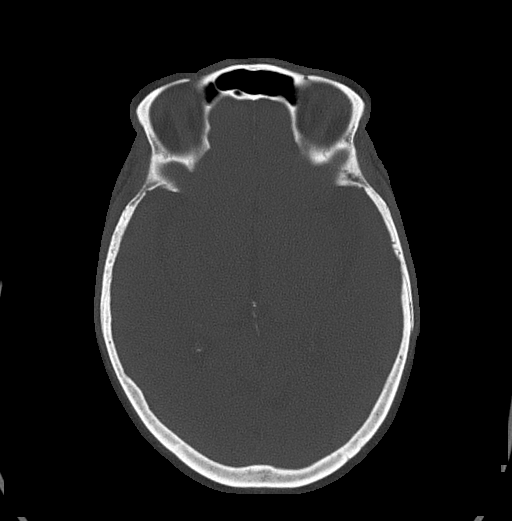
[im 38/70  bone]
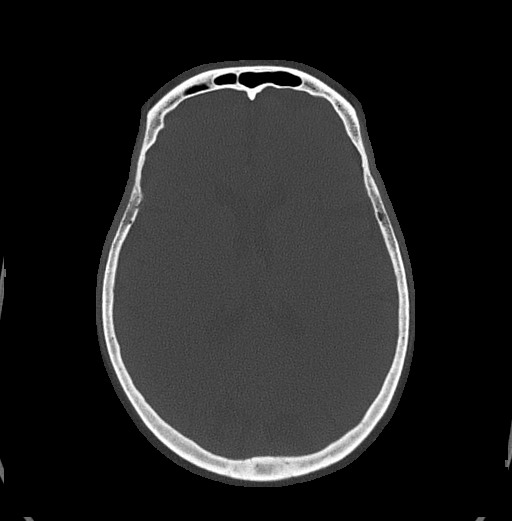
[im 49/70  bone]
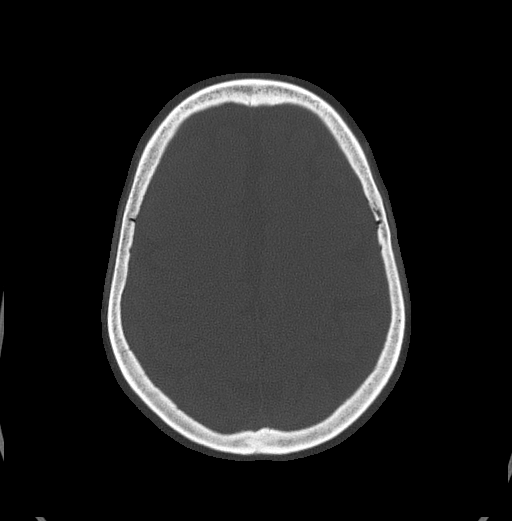
[im 56/70  bone]
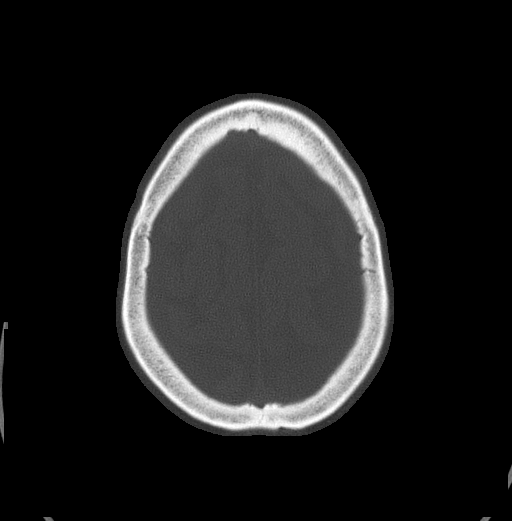
[im 63/70  bone]
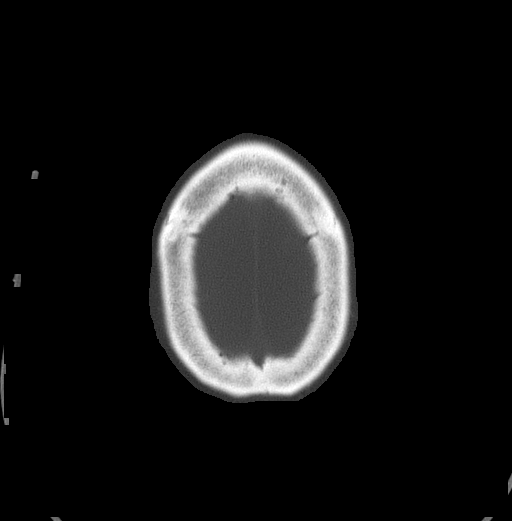

[15 of 30 positions shown; findings below may reference images not displayed]

FINDINGS: Mild diffuse cerebral atrophy. No ventricular dilatation.
Low-attenuation changes in the deep white matter consistent small
vessel ischemia. Cortical calcifications likely dystrophic. No mass
effect or midline shift. No abnormal extra-axial fluid collections.
Gray-white matter junctions are distinct. Basal cisterns are not
effaced. No evidence of acute intracranial hemorrhage. No depressed
skull fractures. Visualized paranasal sinuses and mastoid air cells
are not opacified. Vascular calcifications.
IMPRESSION: No acute intracranial abnormalities. Chronic atrophy and small
vessel ischemic changes.

## 2017-03-14 IMAGING — CR DG CHEST 1V PORT
1 series · 1 of 1 positions shown · non-contrast
Comparison: 01/16/2016

CLINICAL DATA: Shortness of breath and congestion for 2 weeks.

EXAM:
PORTABLE CHEST 1 VIEW

[AP]
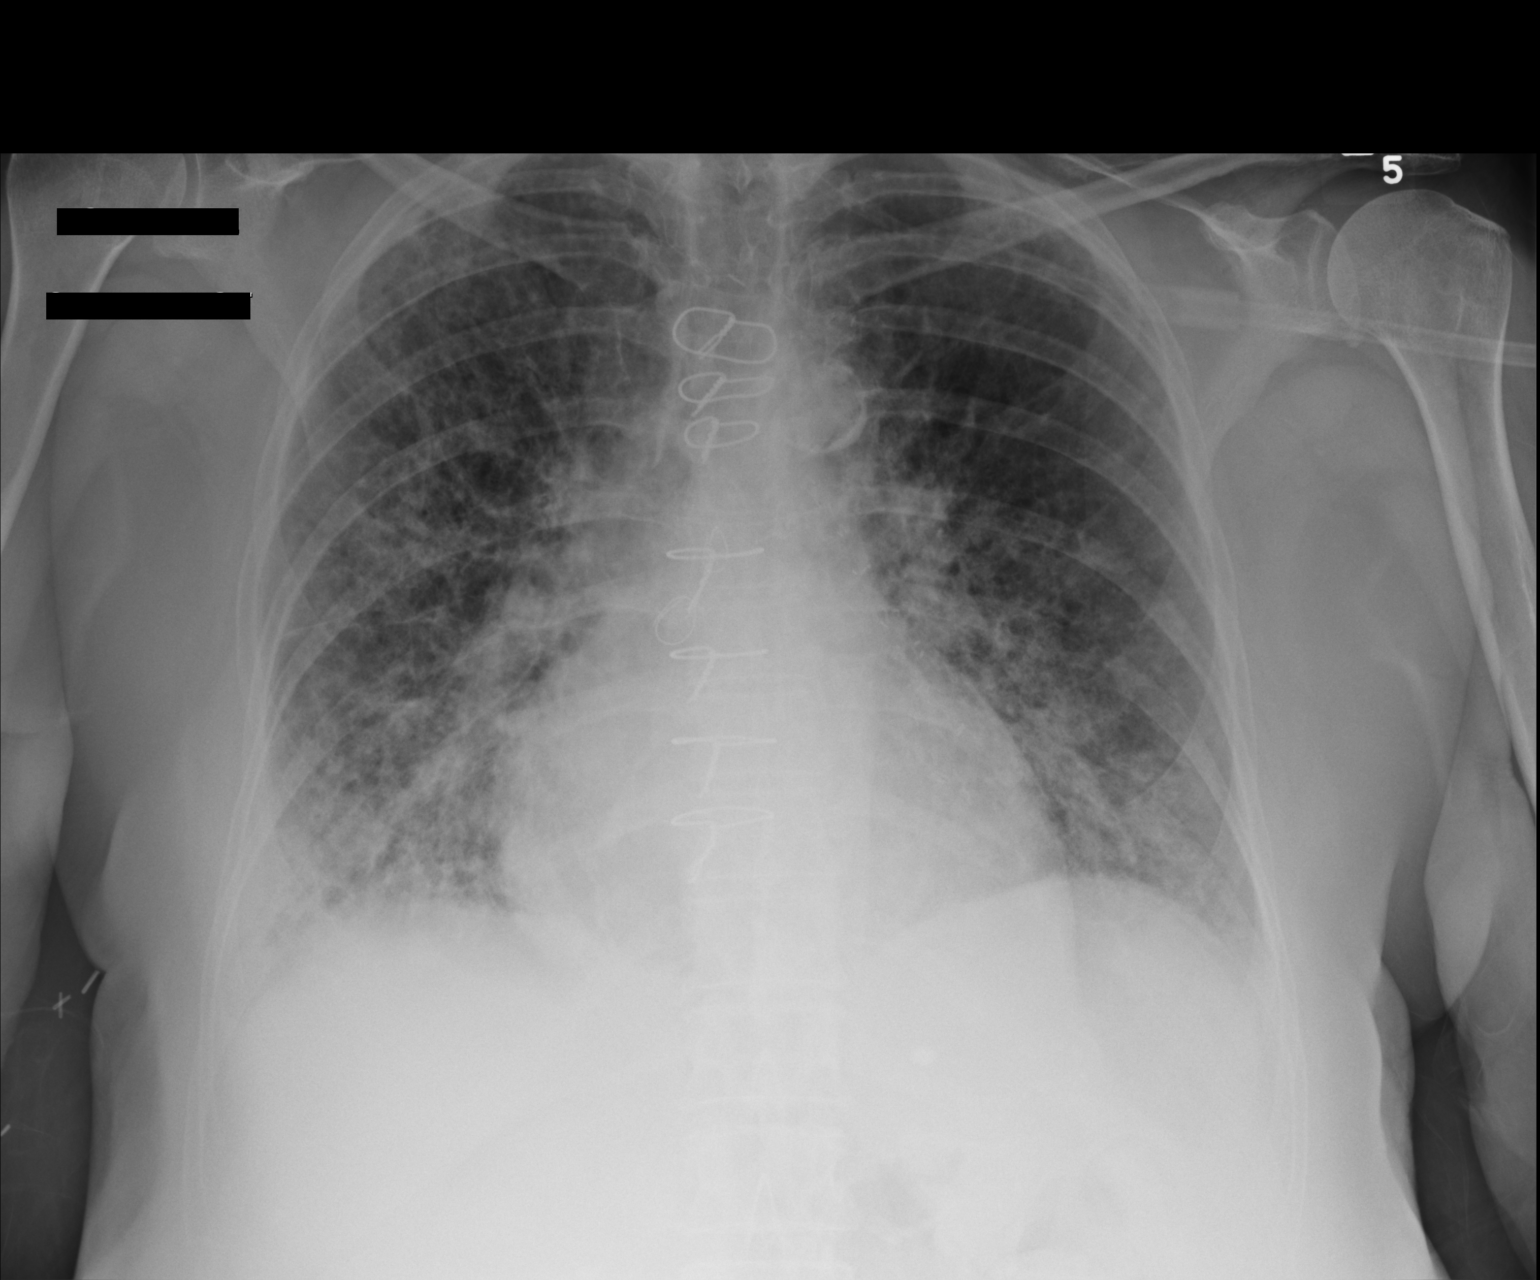

[1 of 1 positions shown; findings below may reference images not displayed]

FINDINGS: Prior CABG. Heart is borderline in size. There is diffuse
interstitial and reticulonodular disease throughout the lungs
bilaterally, similar to prior study. No effusions. No acute bony
abnormality.
IMPRESSION: Diffuse interstitial and reticulonodular disease is stable since
prior study. Cannot exclude interstitial pneumonia.
